# Patient Record
Sex: Female | Born: 1985
Health system: Southern US, Community
[De-identification: ages and names within clinical notes are randomized; demographics above are authoritative.]

## PROBLEM LIST (undated history)

## (undated) DIAGNOSIS — C569 Malignant neoplasm of unspecified ovary: Secondary | ICD-10-CM

## (undated) DIAGNOSIS — I10 Essential (primary) hypertension: Secondary | ICD-10-CM

## (undated) DIAGNOSIS — F419 Anxiety disorder, unspecified: Secondary | ICD-10-CM

## (undated) DIAGNOSIS — C801 Malignant (primary) neoplasm, unspecified: Secondary | ICD-10-CM

## (undated) DIAGNOSIS — G40409 Other generalized epilepsy and epileptic syndromes, not intractable, without status epilepticus: Secondary | ICD-10-CM

## (undated) DIAGNOSIS — F32A Depression, unspecified: Secondary | ICD-10-CM

## (undated) DIAGNOSIS — N200 Calculus of kidney: Secondary | ICD-10-CM

## (undated) DIAGNOSIS — D496 Neoplasm of unspecified behavior of brain: Secondary | ICD-10-CM

## (undated) DIAGNOSIS — F329 Major depressive disorder, single episode, unspecified: Secondary | ICD-10-CM

## (undated) DIAGNOSIS — R569 Unspecified convulsions: Secondary | ICD-10-CM

## (undated) DIAGNOSIS — J45909 Unspecified asthma, uncomplicated: Secondary | ICD-10-CM

## (undated) HISTORY — PX: ABDOMINAL HYSTERECTOMY: SHX81

## (undated) HISTORY — PX: APPENDECTOMY: SHX54

## (undated) HISTORY — PX: OTHER SURGICAL HISTORY: SHX169

## (undated) HISTORY — DX: Malignant neoplasm of unspecified ovary: C56.9

---
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
## 2004-04-01 ENCOUNTER — Emergency Department (HOSPITAL_COMMUNITY): Admission: EM | Admit: 2004-04-01 | Discharge: 2004-04-02 | Payer: Self-pay | Admitting: Emergency Medicine

## 2004-04-20 ENCOUNTER — Emergency Department (HOSPITAL_COMMUNITY): Admission: EM | Admit: 2004-04-20 | Discharge: 2004-04-20 | Payer: Self-pay | Admitting: Emergency Medicine

## 2004-04-30 ENCOUNTER — Emergency Department (HOSPITAL_COMMUNITY): Admission: EM | Admit: 2004-04-30 | Discharge: 2004-04-30 | Payer: Self-pay | Admitting: Emergency Medicine

## 2004-05-22 ENCOUNTER — Other Ambulatory Visit: Admission: RE | Admit: 2004-05-22 | Discharge: 2004-05-22 | Payer: Self-pay | Admitting: Obstetrics and Gynecology

## 2004-05-26 ENCOUNTER — Emergency Department (HOSPITAL_COMMUNITY): Admission: EM | Admit: 2004-05-26 | Discharge: 2004-05-26 | Payer: Self-pay | Admitting: Emergency Medicine

## 2004-08-26 ENCOUNTER — Emergency Department (HOSPITAL_COMMUNITY): Admission: EM | Admit: 2004-08-26 | Discharge: 2004-08-26 | Payer: Self-pay | Admitting: Emergency Medicine

## 2004-10-06 ENCOUNTER — Emergency Department (HOSPITAL_COMMUNITY): Admission: EM | Admit: 2004-10-06 | Discharge: 2004-10-06 | Payer: Self-pay | Admitting: Emergency Medicine

## 2005-02-01 ENCOUNTER — Emergency Department (HOSPITAL_COMMUNITY): Admission: EM | Admit: 2005-02-01 | Discharge: 2005-02-02 | Payer: Self-pay | Admitting: Emergency Medicine

## 2005-03-14 ENCOUNTER — Other Ambulatory Visit: Admission: RE | Admit: 2005-03-14 | Discharge: 2005-03-14 | Payer: Self-pay | Admitting: Obstetrics and Gynecology

## 2005-06-16 ENCOUNTER — Emergency Department (HOSPITAL_COMMUNITY): Admission: EM | Admit: 2005-06-16 | Discharge: 2005-06-16 | Payer: Self-pay | Admitting: Emergency Medicine

## 2005-07-06 ENCOUNTER — Encounter: Admission: RE | Admit: 2005-07-06 | Discharge: 2005-07-06 | Payer: Self-pay | Admitting: Neurology

## 2005-07-13 ENCOUNTER — Encounter: Admission: RE | Admit: 2005-07-13 | Discharge: 2005-07-13 | Payer: Self-pay | Admitting: Neurology

## 2005-07-30 ENCOUNTER — Emergency Department (HOSPITAL_COMMUNITY): Admission: EM | Admit: 2005-07-30 | Discharge: 2005-07-31 | Payer: Self-pay | Admitting: Emergency Medicine

## 2005-11-10 ENCOUNTER — Emergency Department (HOSPITAL_COMMUNITY): Admission: EM | Admit: 2005-11-10 | Discharge: 2005-11-10 | Payer: Self-pay | Admitting: Emergency Medicine

## 2006-07-11 ENCOUNTER — Emergency Department (HOSPITAL_COMMUNITY): Admission: EM | Admit: 2006-07-11 | Discharge: 2006-07-12 | Payer: Self-pay | Admitting: Emergency Medicine

## 2006-07-22 ENCOUNTER — Emergency Department (HOSPITAL_COMMUNITY): Admission: EM | Admit: 2006-07-22 | Discharge: 2006-07-22 | Payer: Self-pay | Admitting: Emergency Medicine

## 2006-08-01 ENCOUNTER — Emergency Department (HOSPITAL_COMMUNITY): Admission: EM | Admit: 2006-08-01 | Discharge: 2006-08-01 | Payer: Self-pay | Admitting: Emergency Medicine

## 2006-08-24 ENCOUNTER — Emergency Department (HOSPITAL_COMMUNITY): Admission: EM | Admit: 2006-08-24 | Discharge: 2006-08-24 | Payer: Self-pay | Admitting: Emergency Medicine

## 2006-08-26 ENCOUNTER — Emergency Department (HOSPITAL_COMMUNITY): Admission: EM | Admit: 2006-08-26 | Discharge: 2006-08-27 | Payer: Self-pay | Admitting: Emergency Medicine

## 2006-09-29 ENCOUNTER — Emergency Department (HOSPITAL_COMMUNITY): Admission: EM | Admit: 2006-09-29 | Discharge: 2006-09-29 | Payer: Self-pay | Admitting: *Deleted

## 2006-10-07 ENCOUNTER — Ambulatory Visit: Payer: Self-pay | Admitting: Family Medicine

## 2006-11-26 ENCOUNTER — Emergency Department (HOSPITAL_COMMUNITY): Admission: EM | Admit: 2006-11-26 | Discharge: 2006-11-26 | Payer: Self-pay | Admitting: Emergency Medicine

## 2006-12-04 ENCOUNTER — Emergency Department (HOSPITAL_COMMUNITY): Admission: EM | Admit: 2006-12-04 | Discharge: 2006-12-04 | Payer: Self-pay | Admitting: Emergency Medicine

## 2006-12-04 ENCOUNTER — Ambulatory Visit (HOSPITAL_BASED_OUTPATIENT_CLINIC_OR_DEPARTMENT_OTHER): Admission: RE | Admit: 2006-12-04 | Discharge: 2006-12-04 | Payer: Self-pay | Admitting: Urology

## 2006-12-16 ENCOUNTER — Ambulatory Visit (HOSPITAL_COMMUNITY): Admission: RE | Admit: 2006-12-16 | Discharge: 2006-12-16 | Payer: Self-pay | Admitting: Urology

## 2007-01-06 ENCOUNTER — Ambulatory Visit (HOSPITAL_COMMUNITY): Admission: RE | Admit: 2007-01-06 | Discharge: 2007-01-06 | Payer: Self-pay | Admitting: Family Medicine

## 2007-03-13 ENCOUNTER — Emergency Department (HOSPITAL_COMMUNITY): Admission: EM | Admit: 2007-03-13 | Discharge: 2007-03-13 | Payer: Self-pay | Admitting: Family Medicine

## 2007-05-06 ENCOUNTER — Emergency Department (HOSPITAL_COMMUNITY): Admission: EM | Admit: 2007-05-06 | Discharge: 2007-05-06 | Payer: Self-pay | Admitting: Emergency Medicine

## 2007-05-09 ENCOUNTER — Encounter (INDEPENDENT_AMBULATORY_CARE_PROVIDER_SITE_OTHER): Payer: Self-pay | Admitting: Family Medicine

## 2007-05-09 LAB — CONVERTED CEMR LAB: Microalb, Ur: 0.53 mg/dL (ref 0.00–1.89)

## 2007-05-20 ENCOUNTER — Encounter: Payer: Self-pay | Admitting: Family Medicine

## 2007-05-20 ENCOUNTER — Other Ambulatory Visit: Admission: RE | Admit: 2007-05-20 | Discharge: 2007-05-20 | Payer: Self-pay | Admitting: Family Medicine

## 2007-05-20 ENCOUNTER — Ambulatory Visit: Payer: Self-pay | Admitting: Internal Medicine

## 2007-05-20 LAB — CONVERTED CEMR LAB
CO2: 23 meq/L (ref 19–32)
Calcium: 9.3 mg/dL (ref 8.4–10.5)
Chloride: 104 meq/L (ref 96–112)
Cholesterol: 173 mg/dL (ref 0–200)
Creatinine, Ser: 0.85 mg/dL (ref 0.40–1.20)
Eosinophils Relative: 1 % (ref 0–5)
Glucose, Bld: 84 mg/dL (ref 70–99)
HCT: 33.6 % — ABNORMAL LOW (ref 36.0–46.0)
Hemoglobin: 10.6 g/dL — ABNORMAL LOW (ref 12.0–15.0)
Lymphocytes Relative: 42 % (ref 12–46)
Lymphs Abs: 2.3 10*3/uL (ref 0.7–4.0)
Monocytes Absolute: 0.2 10*3/uL (ref 0.1–1.0)
Sodium: 143 meq/L (ref 135–145)
Total Bilirubin: 0.3 mg/dL (ref 0.3–1.2)
Total Protein: 7.7 g/dL (ref 6.0–8.3)
Triglycerides: 79 mg/dL (ref <150)
VLDL: 16 mg/dL (ref 0–40)
WBC: 5.5 10*3/uL (ref 4.0–10.5)

## 2007-06-02 ENCOUNTER — Emergency Department (HOSPITAL_COMMUNITY): Admission: EM | Admit: 2007-06-02 | Discharge: 2007-06-02 | Payer: Self-pay | Admitting: Emergency Medicine

## 2007-06-09 ENCOUNTER — Emergency Department (HOSPITAL_COMMUNITY): Admission: EM | Admit: 2007-06-09 | Discharge: 2007-06-09 | Payer: Self-pay | Admitting: Emergency Medicine

## 2007-06-18 ENCOUNTER — Ambulatory Visit: Payer: Self-pay | Admitting: Internal Medicine

## 2007-07-02 ENCOUNTER — Ambulatory Visit: Payer: Self-pay | Admitting: *Deleted

## 2007-07-02 ENCOUNTER — Inpatient Hospital Stay (HOSPITAL_COMMUNITY): Admission: EM | Admit: 2007-07-02 | Discharge: 2007-07-06 | Payer: Self-pay | Admitting: *Deleted

## 2007-07-18 ENCOUNTER — Ambulatory Visit: Payer: Self-pay | Admitting: Internal Medicine

## 2007-07-22 ENCOUNTER — Ambulatory Visit (HOSPITAL_COMMUNITY): Admission: RE | Admit: 2007-07-22 | Discharge: 2007-07-22 | Payer: Self-pay | Admitting: Internal Medicine

## 2007-07-28 ENCOUNTER — Emergency Department (HOSPITAL_COMMUNITY): Admission: EM | Admit: 2007-07-28 | Discharge: 2007-07-28 | Payer: Self-pay | Admitting: Emergency Medicine

## 2007-08-03 ENCOUNTER — Emergency Department (HOSPITAL_COMMUNITY): Admission: EM | Admit: 2007-08-03 | Discharge: 2007-08-03 | Payer: Self-pay | Admitting: Emergency Medicine

## 2007-08-13 ENCOUNTER — Emergency Department (HOSPITAL_COMMUNITY): Admission: EM | Admit: 2007-08-13 | Discharge: 2007-08-13 | Payer: Self-pay | Admitting: Emergency Medicine

## 2007-09-13 ENCOUNTER — Emergency Department (HOSPITAL_COMMUNITY): Admission: EM | Admit: 2007-09-13 | Discharge: 2007-09-13 | Payer: Self-pay | Admitting: Emergency Medicine

## 2007-11-13 ENCOUNTER — Emergency Department (HOSPITAL_COMMUNITY): Admission: EM | Admit: 2007-11-13 | Discharge: 2007-11-13 | Payer: Self-pay | Admitting: Emergency Medicine

## 2007-11-30 ENCOUNTER — Emergency Department (HOSPITAL_COMMUNITY): Admission: EM | Admit: 2007-11-30 | Discharge: 2007-11-30 | Payer: Self-pay | Admitting: Emergency Medicine

## 2007-12-12 ENCOUNTER — Emergency Department (HOSPITAL_COMMUNITY): Admission: EM | Admit: 2007-12-12 | Discharge: 2007-12-12 | Payer: Self-pay | Admitting: Emergency Medicine

## 2007-12-13 ENCOUNTER — Ambulatory Visit: Payer: Self-pay | Admitting: Psychiatry

## 2007-12-13 ENCOUNTER — Inpatient Hospital Stay (HOSPITAL_COMMUNITY): Admission: AD | Admit: 2007-12-13 | Discharge: 2007-12-15 | Payer: Self-pay | Admitting: Psychiatry

## 2007-12-18 ENCOUNTER — Emergency Department (HOSPITAL_COMMUNITY): Admission: EM | Admit: 2007-12-18 | Discharge: 2007-12-18 | Payer: Self-pay | Admitting: Emergency Medicine

## 2008-01-15 ENCOUNTER — Emergency Department (HOSPITAL_COMMUNITY): Admission: EM | Admit: 2008-01-15 | Discharge: 2008-01-16 | Payer: Self-pay | Admitting: Emergency Medicine

## 2008-02-07 ENCOUNTER — Emergency Department (HOSPITAL_COMMUNITY): Admission: EM | Admit: 2008-02-07 | Discharge: 2008-02-07 | Payer: Self-pay | Admitting: Emergency Medicine

## 2008-02-08 ENCOUNTER — Emergency Department (HOSPITAL_COMMUNITY): Admission: EM | Admit: 2008-02-08 | Discharge: 2008-02-08 | Payer: Self-pay | Admitting: Emergency Medicine

## 2008-02-24 ENCOUNTER — Encounter (INDEPENDENT_AMBULATORY_CARE_PROVIDER_SITE_OTHER): Payer: Self-pay | Admitting: Adult Health

## 2008-02-24 ENCOUNTER — Ambulatory Visit: Payer: Self-pay | Admitting: Internal Medicine

## 2008-02-24 LAB — CONVERTED CEMR LAB
Albumin: 4.1 g/dL (ref 3.5–5.2)
Basophils Absolute: 0 10*3/uL (ref 0.0–0.1)
Basophils Relative: 0 % (ref 0–1)
CO2: 22 meq/L (ref 19–32)
Calcium: 8.9 mg/dL (ref 8.4–10.5)
Chloride: 109 meq/L (ref 96–112)
Cholesterol: 120 mg/dL (ref 0–200)
Eosinophils Absolute: 0 10*3/uL (ref 0.0–0.7)
Eosinophils Relative: 0 % (ref 0–5)
Glucose, Bld: 89 mg/dL (ref 70–99)
Hemoglobin: 11.6 g/dL — ABNORMAL LOW (ref 12.0–15.0)
LDL Cholesterol: 72 mg/dL (ref 0–99)
MCHC: 30.8 g/dL (ref 30.0–36.0)
MCV: 94.5 fL (ref 78.0–100.0)
Monocytes Absolute: 0.4 10*3/uL (ref 0.1–1.0)
Monocytes Relative: 6 % (ref 3–12)
Neutro Abs: 4 10*3/uL (ref 1.7–7.7)
RDW: 13.3 % (ref 11.5–15.5)
Total Protein: 7.4 g/dL (ref 6.0–8.3)
Triglycerides: 59 mg/dL (ref <150)

## 2008-02-25 ENCOUNTER — Encounter (INDEPENDENT_AMBULATORY_CARE_PROVIDER_SITE_OTHER): Payer: Self-pay | Admitting: Adult Health

## 2008-03-11 ENCOUNTER — Ambulatory Visit: Payer: Self-pay | Admitting: Internal Medicine

## 2008-03-15 ENCOUNTER — Emergency Department (HOSPITAL_COMMUNITY): Admission: EM | Admit: 2008-03-15 | Discharge: 2008-03-16 | Payer: Self-pay | Admitting: Emergency Medicine

## 2008-03-19 ENCOUNTER — Encounter: Admission: RE | Admit: 2008-03-19 | Discharge: 2008-03-19 | Payer: Self-pay | Admitting: Internal Medicine

## 2008-03-24 ENCOUNTER — Encounter (INDEPENDENT_AMBULATORY_CARE_PROVIDER_SITE_OTHER): Payer: Self-pay | Admitting: Adult Health

## 2008-03-24 ENCOUNTER — Ambulatory Visit: Payer: Self-pay | Admitting: Internal Medicine

## 2008-04-13 ENCOUNTER — Emergency Department (HOSPITAL_COMMUNITY): Admission: EM | Admit: 2008-04-13 | Discharge: 2008-04-13 | Payer: Self-pay | Admitting: *Deleted

## 2008-06-20 ENCOUNTER — Emergency Department (HOSPITAL_COMMUNITY): Admission: EM | Admit: 2008-06-20 | Discharge: 2008-06-20 | Payer: Self-pay | Admitting: Emergency Medicine

## 2008-09-11 ENCOUNTER — Emergency Department (HOSPITAL_COMMUNITY): Admission: EM | Admit: 2008-09-11 | Discharge: 2008-09-11 | Payer: Self-pay | Admitting: Emergency Medicine

## 2008-09-18 ENCOUNTER — Emergency Department (HOSPITAL_COMMUNITY): Admission: EM | Admit: 2008-09-18 | Discharge: 2008-09-18 | Payer: Self-pay | Admitting: Emergency Medicine

## 2008-09-27 ENCOUNTER — Emergency Department (HOSPITAL_COMMUNITY): Admission: EM | Admit: 2008-09-27 | Discharge: 2008-09-28 | Payer: Self-pay | Admitting: Emergency Medicine

## 2008-09-29 ENCOUNTER — Emergency Department (HOSPITAL_COMMUNITY): Admission: EM | Admit: 2008-09-29 | Discharge: 2008-09-30 | Payer: Self-pay | Admitting: Emergency Medicine

## 2008-10-02 ENCOUNTER — Emergency Department (HOSPITAL_BASED_OUTPATIENT_CLINIC_OR_DEPARTMENT_OTHER): Admission: EM | Admit: 2008-10-02 | Discharge: 2008-10-02 | Payer: Self-pay | Admitting: Emergency Medicine

## 2008-10-27 ENCOUNTER — Encounter (INDEPENDENT_AMBULATORY_CARE_PROVIDER_SITE_OTHER): Payer: Self-pay | Admitting: Adult Health

## 2008-10-27 ENCOUNTER — Ambulatory Visit: Payer: Self-pay | Admitting: Family Medicine

## 2008-10-27 LAB — CONVERTED CEMR LAB: GC Probe Amp, Urine: NEGATIVE

## 2008-10-28 ENCOUNTER — Encounter (INDEPENDENT_AMBULATORY_CARE_PROVIDER_SITE_OTHER): Payer: Self-pay | Admitting: Adult Health

## 2008-11-15 ENCOUNTER — Emergency Department (HOSPITAL_COMMUNITY): Admission: EM | Admit: 2008-11-15 | Discharge: 2008-11-16 | Payer: Self-pay | Admitting: Emergency Medicine

## 2008-12-14 ENCOUNTER — Ambulatory Visit: Payer: Self-pay | Admitting: Internal Medicine

## 2008-12-26 ENCOUNTER — Emergency Department (HOSPITAL_COMMUNITY): Admission: EM | Admit: 2008-12-26 | Discharge: 2008-12-26 | Payer: Self-pay | Admitting: Emergency Medicine

## 2009-02-27 ENCOUNTER — Emergency Department (HOSPITAL_COMMUNITY): Admission: EM | Admit: 2009-02-27 | Discharge: 2009-02-27 | Payer: Self-pay | Admitting: Emergency Medicine

## 2009-02-28 ENCOUNTER — Inpatient Hospital Stay (HOSPITAL_COMMUNITY): Admission: AD | Admit: 2009-02-28 | Discharge: 2009-03-02 | Payer: Self-pay | Admitting: Psychiatry

## 2009-02-28 ENCOUNTER — Ambulatory Visit: Payer: Self-pay | Admitting: Psychiatry

## 2009-03-07 ENCOUNTER — Observation Stay (HOSPITAL_COMMUNITY): Admission: EM | Admit: 2009-03-07 | Discharge: 2009-03-08 | Payer: Self-pay | Admitting: Emergency Medicine

## 2009-03-14 ENCOUNTER — Ambulatory Visit: Payer: Self-pay | Admitting: Internal Medicine

## 2009-03-17 ENCOUNTER — Observation Stay (HOSPITAL_COMMUNITY): Admission: EM | Admit: 2009-03-17 | Discharge: 2009-03-17 | Payer: Self-pay | Admitting: Emergency Medicine

## 2009-05-08 ENCOUNTER — Emergency Department (HOSPITAL_COMMUNITY): Admission: EM | Admit: 2009-05-08 | Discharge: 2009-05-08 | Payer: Self-pay | Admitting: Emergency Medicine

## 2009-05-10 ENCOUNTER — Emergency Department (HOSPITAL_COMMUNITY): Admission: EM | Admit: 2009-05-10 | Discharge: 2009-05-10 | Payer: Self-pay | Admitting: Emergency Medicine

## 2009-05-16 ENCOUNTER — Ambulatory Visit: Payer: Self-pay | Admitting: Family Medicine

## 2009-06-04 ENCOUNTER — Emergency Department (HOSPITAL_COMMUNITY): Admission: EM | Admit: 2009-06-04 | Discharge: 2009-06-05 | Payer: Self-pay | Admitting: Emergency Medicine

## 2009-06-15 ENCOUNTER — Encounter (INDEPENDENT_AMBULATORY_CARE_PROVIDER_SITE_OTHER): Payer: Self-pay | Admitting: Adult Health

## 2009-06-15 ENCOUNTER — Ambulatory Visit: Payer: Self-pay | Admitting: Internal Medicine

## 2009-06-15 ENCOUNTER — Other Ambulatory Visit: Admission: RE | Admit: 2009-06-15 | Discharge: 2009-06-15 | Payer: Self-pay | Admitting: Internal Medicine

## 2009-06-15 LAB — CONVERTED CEMR LAB
AST: 15 units/L (ref 0–37)
Albumin: 4.6 g/dL (ref 3.5–5.2)
Alkaline Phosphatase: 95 units/L (ref 39–117)
Amphetamine Screen, Ur: NEGATIVE
BUN: 12 mg/dL (ref 6–23)
Benzodiazepines.: NEGATIVE
HDL: 35 mg/dL — ABNORMAL LOW (ref >39)
LDL Cholesterol: 95 mg/dL (ref 0–99)
Methadone: NEGATIVE
Pap Smear: NEGATIVE
Potassium: 3.9 meq/L (ref 3.5–5.3)
Propoxyphene: NEGATIVE
Sodium: 139 meq/L (ref 135–145)

## 2009-07-06 ENCOUNTER — Emergency Department (HOSPITAL_COMMUNITY): Admission: EM | Admit: 2009-07-06 | Discharge: 2009-07-06 | Payer: Self-pay | Admitting: Emergency Medicine

## 2009-08-03 ENCOUNTER — Emergency Department (HOSPITAL_COMMUNITY): Admission: EM | Admit: 2009-08-03 | Discharge: 2009-08-03 | Payer: Self-pay | Admitting: Family Medicine

## 2009-10-16 ENCOUNTER — Emergency Department (HOSPITAL_COMMUNITY): Admission: EM | Admit: 2009-10-16 | Discharge: 2009-10-16 | Payer: Self-pay | Admitting: Emergency Medicine

## 2009-11-14 ENCOUNTER — Emergency Department (HOSPITAL_COMMUNITY): Admission: EM | Admit: 2009-11-14 | Discharge: 2009-11-14 | Payer: Self-pay | Admitting: Emergency Medicine

## 2009-11-16 ENCOUNTER — Emergency Department (HOSPITAL_COMMUNITY): Admission: EM | Admit: 2009-11-16 | Discharge: 2009-11-17 | Payer: Self-pay | Admitting: Emergency Medicine

## 2009-12-03 ENCOUNTER — Emergency Department (HOSPITAL_COMMUNITY): Admission: EM | Admit: 2009-12-03 | Discharge: 2009-12-04 | Payer: Self-pay | Admitting: Emergency Medicine

## 2010-03-15 ENCOUNTER — Emergency Department (HOSPITAL_COMMUNITY)
Admission: EM | Admit: 2010-03-15 | Discharge: 2010-03-15 | Payer: Self-pay | Source: Home / Self Care | Admitting: Emergency Medicine

## 2010-03-19 ENCOUNTER — Encounter: Payer: Self-pay | Admitting: Internal Medicine

## 2010-03-20 LAB — URINALYSIS, ROUTINE W REFLEX MICROSCOPIC
Protein, ur: NEGATIVE mg/dL
Urobilinogen, UA: 1 mg/dL (ref 0.0–1.0)

## 2010-03-20 LAB — GC/CHLAMYDIA PROBE AMP, GENITAL: GC Probe Amp, Genital: NEGATIVE

## 2010-03-20 LAB — URINE CULTURE
Colony Count: 15000
Culture  Setup Time: 201201181628

## 2010-03-20 LAB — URINE MICROSCOPIC-ADD ON

## 2010-03-20 LAB — WET PREP, GENITAL: Yeast Wet Prep HPF POC: NONE SEEN

## 2010-03-22 ENCOUNTER — Emergency Department (HOSPITAL_COMMUNITY)
Admission: EM | Admit: 2010-03-22 | Discharge: 2010-03-22 | Payer: Self-pay | Source: Home / Self Care | Admitting: Emergency Medicine

## 2010-03-22 LAB — URINALYSIS, ROUTINE W REFLEX MICROSCOPIC
Ketones, ur: NEGATIVE mg/dL
Nitrite: NEGATIVE
Protein, ur: NEGATIVE mg/dL
Urobilinogen, UA: 1 mg/dL (ref 0.0–1.0)

## 2010-04-05 ENCOUNTER — Emergency Department (HOSPITAL_COMMUNITY)
Admission: EM | Admit: 2010-04-05 | Discharge: 2010-04-05 | Disposition: A | Discharge status: Home / Self Care | Payer: Medicaid Other | Attending: Emergency Medicine | Admitting: Emergency Medicine

## 2010-04-05 ENCOUNTER — Emergency Department (HOSPITAL_COMMUNITY): Payer: Medicaid Other

## 2010-04-05 DIAGNOSIS — H539 Unspecified visual disturbance: Secondary | ICD-10-CM | POA: Insufficient documentation

## 2010-04-05 DIAGNOSIS — D352 Benign neoplasm of pituitary gland: Secondary | ICD-10-CM | POA: Insufficient documentation

## 2010-04-05 DIAGNOSIS — R51 Headache: Secondary | ICD-10-CM | POA: Insufficient documentation

## 2010-04-05 DIAGNOSIS — D353 Benign neoplasm of craniopharyngeal duct: Secondary | ICD-10-CM | POA: Insufficient documentation

## 2010-04-05 DIAGNOSIS — Z87442 Personal history of urinary calculi: Secondary | ICD-10-CM | POA: Insufficient documentation

## 2010-04-05 DIAGNOSIS — J45909 Unspecified asthma, uncomplicated: Secondary | ICD-10-CM | POA: Insufficient documentation

## 2010-04-05 DIAGNOSIS — R110 Nausea: Secondary | ICD-10-CM | POA: Insufficient documentation

## 2010-04-05 DIAGNOSIS — F319 Bipolar disorder, unspecified: Secondary | ICD-10-CM | POA: Insufficient documentation

## 2010-04-05 LAB — POCT PREGNANCY, URINE: Preg Test, Ur: NEGATIVE

## 2010-04-18 ENCOUNTER — Emergency Department (HOSPITAL_COMMUNITY): Payer: Medicaid Other

## 2010-04-18 ENCOUNTER — Emergency Department (HOSPITAL_COMMUNITY)
Admission: EM | Admit: 2010-04-18 | Discharge: 2010-04-18 | Disposition: A | Discharge status: Home / Self Care | Payer: Medicaid Other | Attending: Emergency Medicine | Admitting: Emergency Medicine

## 2010-04-18 DIAGNOSIS — R112 Nausea with vomiting, unspecified: Secondary | ICD-10-CM | POA: Insufficient documentation

## 2010-04-18 DIAGNOSIS — R109 Unspecified abdominal pain: Secondary | ICD-10-CM | POA: Insufficient documentation

## 2010-04-18 DIAGNOSIS — R197 Diarrhea, unspecified: Secondary | ICD-10-CM | POA: Insufficient documentation

## 2010-04-18 LAB — CBC
Platelets: 344 10*3/uL (ref 150–400)
RDW: 13 % (ref 11.5–15.5)
WBC: 9.4 10*3/uL (ref 4.0–10.5)

## 2010-04-18 LAB — DIFFERENTIAL
Basophils Absolute: 0 10*3/uL (ref 0.0–0.1)
Basophils Relative: 0 % (ref 0–1)
Eosinophils Absolute: 0 10*3/uL (ref 0.0–0.7)
Eosinophils Relative: 0 % (ref 0–5)
Lymphocytes Relative: 35 % (ref 12–46)

## 2010-04-18 LAB — COMPREHENSIVE METABOLIC PANEL
ALT: 17 U/L (ref 0–35)
Albumin: 3.8 g/dL (ref 3.5–5.2)
Alkaline Phosphatase: 83 U/L (ref 39–117)
GFR calc Af Amer: >60 mL/min (ref >60)
Potassium: 3.5 mEq/L (ref 3.5–5.1)
Sodium: 139 mEq/L (ref 135–145)
Total Protein: 7.6 g/dL (ref 6.0–8.3)

## 2010-04-24 ENCOUNTER — Emergency Department (HOSPITAL_COMMUNITY): Payer: Medicaid Other

## 2010-04-24 ENCOUNTER — Emergency Department (HOSPITAL_COMMUNITY)
Admission: EM | Admit: 2010-04-24 | Discharge: 2010-04-24 | Disposition: A | Discharge status: Home / Self Care | Payer: Medicaid Other | Attending: Emergency Medicine | Admitting: Emergency Medicine

## 2010-04-24 ENCOUNTER — Encounter (HOSPITAL_COMMUNITY): Payer: Self-pay

## 2010-04-24 DIAGNOSIS — F17200 Nicotine dependence, unspecified, uncomplicated: Secondary | ICD-10-CM | POA: Insufficient documentation

## 2010-04-24 DIAGNOSIS — F319 Bipolar disorder, unspecified: Secondary | ICD-10-CM | POA: Insufficient documentation

## 2010-04-24 DIAGNOSIS — N200 Calculus of kidney: Secondary | ICD-10-CM | POA: Insufficient documentation

## 2010-04-24 DIAGNOSIS — Z79899 Other long term (current) drug therapy: Secondary | ICD-10-CM | POA: Insufficient documentation

## 2010-04-24 DIAGNOSIS — R109 Unspecified abdominal pain: Secondary | ICD-10-CM | POA: Insufficient documentation

## 2010-04-24 LAB — URINE MICROSCOPIC-ADD ON

## 2010-04-24 LAB — DIFFERENTIAL
Eosinophils Absolute: 0.1 10*3/uL (ref 0.0–0.7)
Eosinophils Relative: 1 % (ref 0–5)
Lymphocytes Relative: 33 % (ref 12–46)
Lymphs Abs: 3 10*3/uL (ref 0.7–4.0)
Monocytes Relative: 6 % (ref 3–12)
Neutrophils Relative %: 60 % (ref 43–77)

## 2010-04-24 LAB — CBC
HCT: 36.6 % (ref 36.0–46.0)
MCH: 29.7 pg (ref 26.0–34.0)
MCV: 93.8 fL (ref 78.0–100.0)
Platelets: 394 10*3/uL (ref 150–400)
RBC: 3.9 MIL/uL (ref 3.87–5.11)

## 2010-04-24 LAB — COMPREHENSIVE METABOLIC PANEL
AST: 23 U/L (ref 0–37)
Albumin: 3.7 g/dL (ref 3.5–5.2)
BUN: 12 mg/dL (ref 6–23)
Calcium: 9 mg/dL (ref 8.4–10.5)
Chloride: 107 mEq/L (ref 96–112)
Creatinine, Ser: 0.74 mg/dL (ref 0.4–1.2)
GFR calc Af Amer: >60 mL/min (ref >60)
Total Protein: 7.2 g/dL (ref 6.0–8.3)

## 2010-04-24 LAB — URINALYSIS, ROUTINE W REFLEX MICROSCOPIC
Bilirubin Urine: NEGATIVE
Specific Gravity, Urine: 1.028 (ref 1.005–1.030)
Urine Glucose, Fasting: NEGATIVE mg/dL
Urobilinogen, UA: 0.2 mg/dL (ref 0.0–1.0)

## 2010-04-25 ENCOUNTER — Other Ambulatory Visit: Payer: Self-pay | Admitting: Family Medicine

## 2010-04-25 DIAGNOSIS — N6452 Nipple discharge: Secondary | ICD-10-CM

## 2010-04-26 ENCOUNTER — Encounter (INDEPENDENT_AMBULATORY_CARE_PROVIDER_SITE_OTHER): Payer: Self-pay | Admitting: *Deleted

## 2010-05-02 ENCOUNTER — Emergency Department (HOSPITAL_COMMUNITY)
Admission: EM | Admit: 2010-05-02 | Discharge: 2010-05-02 | Disposition: A | Discharge status: Home / Self Care | Payer: Medicaid Other | Attending: Emergency Medicine | Admitting: Emergency Medicine

## 2010-05-02 DIAGNOSIS — M545 Low back pain, unspecified: Secondary | ICD-10-CM | POA: Insufficient documentation

## 2010-05-02 DIAGNOSIS — Z87442 Personal history of urinary calculi: Secondary | ICD-10-CM | POA: Insufficient documentation

## 2010-05-02 DIAGNOSIS — R109 Unspecified abdominal pain: Secondary | ICD-10-CM | POA: Insufficient documentation

## 2010-05-02 DIAGNOSIS — E669 Obesity, unspecified: Secondary | ICD-10-CM | POA: Insufficient documentation

## 2010-05-02 DIAGNOSIS — R112 Nausea with vomiting, unspecified: Secondary | ICD-10-CM | POA: Insufficient documentation

## 2010-05-02 LAB — PREGNANCY, URINE: Preg Test, Ur: NEGATIVE

## 2010-05-02 LAB — URINALYSIS, ROUTINE W REFLEX MICROSCOPIC
Bilirubin Urine: NEGATIVE
Glucose, UA: NEGATIVE mg/dL
Hgb urine dipstick: NEGATIVE
Ketones, ur: NEGATIVE mg/dL
Protein, ur: NEGATIVE mg/dL

## 2010-05-02 LAB — RAPID URINE DRUG SCREEN, HOSP PERFORMED
Barbiturates: NOT DETECTED
Benzodiazepines: NOT DETECTED
Cocaine: NOT DETECTED
Opiates: NOT DETECTED

## 2010-05-03 ENCOUNTER — Ambulatory Visit
Admission: RE | Admit: 2010-05-03 | Discharge: 2010-05-03 | Disposition: A | Discharge status: Home / Self Care | Payer: Medicaid Other | Source: Ambulatory Visit | Attending: Family Medicine | Admitting: Family Medicine

## 2010-05-03 DIAGNOSIS — N6452 Nipple discharge: Secondary | ICD-10-CM

## 2010-05-05 ENCOUNTER — Emergency Department (HOSPITAL_COMMUNITY)
Admission: EM | Admit: 2010-05-05 | Discharge: 2010-05-05 | Disposition: A | Discharge status: Home / Self Care | Payer: No Typology Code available for payment source | Attending: Emergency Medicine | Admitting: Emergency Medicine

## 2010-05-05 DIAGNOSIS — Y92410 Unspecified street and highway as the place of occurrence of the external cause: Secondary | ICD-10-CM | POA: Insufficient documentation

## 2010-05-05 DIAGNOSIS — T148XXA Other injury of unspecified body region, initial encounter: Secondary | ICD-10-CM | POA: Insufficient documentation

## 2010-05-05 DIAGNOSIS — M549 Dorsalgia, unspecified: Secondary | ICD-10-CM | POA: Insufficient documentation

## 2010-05-10 LAB — URINE MICROSCOPIC-ADD ON

## 2010-05-10 LAB — DIFFERENTIAL
Basophils Absolute: 0 10*3/uL (ref 0.0–0.1)
Basophils Relative: 0 % (ref 0–1)
Eosinophils Absolute: 0 10*3/uL (ref 0.0–0.7)
Eosinophils Relative: 1 % (ref 0–5)
Monocytes Absolute: 0.4 10*3/uL (ref 0.1–1.0)
Neutro Abs: 7.8 10*3/uL — ABNORMAL HIGH (ref 1.7–7.7)

## 2010-05-10 LAB — URINALYSIS, ROUTINE W REFLEX MICROSCOPIC
Glucose, UA: NEGATIVE mg/dL
Hgb urine dipstick: NEGATIVE
Specific Gravity, Urine: 1.034 — ABNORMAL HIGH (ref 1.005–1.030)
Urobilinogen, UA: 1 mg/dL (ref 0.0–1.0)
pH: 6 (ref 5.0–8.0)

## 2010-05-10 LAB — BASIC METABOLIC PANEL
BUN: 10 mg/dL (ref 6–23)
GFR calc non Af Amer: 46 mL/min — ABNORMAL LOW (ref >60)
Glucose, Bld: 109 mg/dL — ABNORMAL HIGH (ref 70–99)
Potassium: 3.4 mEq/L — ABNORMAL LOW (ref 3.5–5.1)

## 2010-05-10 LAB — CBC
HCT: 35 % — ABNORMAL LOW (ref 36.0–46.0)
MCHC: 33.3 g/dL (ref 30.0–36.0)
MCV: 92.3 fL (ref 78.0–100.0)
RDW: 13.6 % (ref 11.5–15.5)

## 2010-05-10 LAB — URINE CULTURE: Colony Count: >=100000

## 2010-05-10 LAB — RAPID URINE DRUG SCREEN, HOSP PERFORMED
Barbiturates: NOT DETECTED
Opiates: NOT DETECTED
Tetrahydrocannabinol: POSITIVE — AB

## 2010-05-14 LAB — COMPREHENSIVE METABOLIC PANEL
ALT: 20 U/L (ref 0–35)
Albumin: 3.9 g/dL (ref 3.5–5.2)
Alkaline Phosphatase: 88 U/L (ref 39–117)
BUN: 6 mg/dL (ref 6–23)
Chloride: 104 mEq/L (ref 96–112)
Glucose, Bld: 98 mg/dL (ref 70–99)
Potassium: 3 mEq/L — ABNORMAL LOW (ref 3.5–5.1)
Sodium: 139 mEq/L (ref 135–145)
Total Bilirubin: 0.6 mg/dL (ref 0.3–1.2)

## 2010-05-14 LAB — DIFFERENTIAL
Basophils Absolute: 0 10*3/uL (ref 0.0–0.1)
Basophils Absolute: 0 10*3/uL (ref 0.0–0.1)
Basophils Relative: 1 % (ref 0–1)
Basophils Relative: 1 % (ref 0–1)
Eosinophils Absolute: 0.1 10*3/uL (ref 0.0–0.7)
Eosinophils Absolute: 0.2 10*3/uL (ref 0.0–0.7)
Eosinophils Absolute: 0.1 10*3/uL (ref 0.0–0.7)
Eosinophils Relative: 2 % (ref 0–5)
Lymphs Abs: 3.2 10*3/uL (ref 0.7–4.0)
Monocytes Absolute: 0.4 10*3/uL (ref 0.1–1.0)
Monocytes Absolute: 0.5 10*3/uL (ref 0.1–1.0)
Monocytes Absolute: 0.6 10*3/uL (ref 0.1–1.0)
Monocytes Relative: 5 % (ref 3–12)
Neutro Abs: 4.6 10*3/uL (ref 1.7–7.7)
Neutro Abs: 4.8 10*3/uL (ref 1.7–7.7)
Neutrophils Relative %: 56 % (ref 43–77)
Neutrophils Relative %: 69 % (ref 43–77)

## 2010-05-14 LAB — BASIC METABOLIC PANEL
BUN: 12 mg/dL (ref 6–23)
CO2: 18 mEq/L — ABNORMAL LOW (ref 19–32)
Calcium: 9.5 mg/dL (ref 8.4–10.5)
Chloride: 106 mEq/L (ref 96–112)
Creatinine, Ser: 1.27 mg/dL — ABNORMAL HIGH (ref 0.4–1.2)
Glucose, Bld: 124 mg/dL — ABNORMAL HIGH (ref 70–99)
Potassium: 3.7 mEq/L (ref 3.5–5.1)
Sodium: 141 mEq/L (ref 135–145)

## 2010-05-14 LAB — URINALYSIS, ROUTINE W REFLEX MICROSCOPIC
Bilirubin Urine: NEGATIVE
Glucose, UA: NEGATIVE mg/dL
Hgb urine dipstick: NEGATIVE
Ketones, ur: NEGATIVE mg/dL
Nitrite: NEGATIVE
Protein, ur: NEGATIVE mg/dL
Specific Gravity, Urine: 1.029 (ref 1.005–1.030)
Urobilinogen, UA: 1 mg/dL (ref 0.0–1.0)
Urobilinogen, UA: 1 mg/dL (ref 0.0–1.0)

## 2010-05-14 LAB — POCT PREGNANCY, URINE
Preg Test, Ur: NEGATIVE
Preg Test, Ur: NEGATIVE

## 2010-05-14 LAB — POCT I-STAT, CHEM 8
Calcium, Ion: 1.19 mmol/L (ref 1.12–1.32)
Chloride: 109 mEq/L (ref 96–112)
HCT: 39 % (ref 36.0–46.0)
Potassium: 3.7 mEq/L (ref 3.5–5.1)

## 2010-05-14 LAB — RAPID URINE DRUG SCREEN, HOSP PERFORMED
Opiates: NOT DETECTED
Tetrahydrocannabinol: POSITIVE — AB
Tetrahydrocannabinol: POSITIVE — AB

## 2010-05-14 LAB — CBC
HCT: 32.9 % — ABNORMAL LOW (ref 36.0–46.0)
HCT: 33.9 % — ABNORMAL LOW (ref 36.0–46.0)
Hemoglobin: 11.2 g/dL — ABNORMAL LOW (ref 12.0–15.0)
Hemoglobin: 11.6 g/dL — ABNORMAL LOW (ref 12.0–15.0)
MCHC: 32.3 g/dL (ref 30.0–36.0)
MCV: 92.7 fL (ref 78.0–100.0)
RBC: 3.55 MIL/uL — ABNORMAL LOW (ref 3.87–5.11)
RDW: 14 % (ref 11.5–15.5)
WBC: 9.9 10*3/uL (ref 4.0–10.5)
WBC: 6.9 10*3/uL (ref 4.0–10.5)

## 2010-05-14 LAB — TRICYCLICS SCREEN, URINE: TCA Scrn: NOT DETECTED

## 2010-05-14 LAB — PREGNANCY, URINE: Preg Test, Ur: NEGATIVE

## 2010-05-14 LAB — VALPROIC ACID LEVEL: Valproic Acid Lvl: <10 ug/mL — ABNORMAL LOW (ref 50.0–100.0)

## 2010-05-14 LAB — POCT CARDIAC MARKERS: Troponin i, poc: <0.05 ng/mL (ref 0.00–0.09)

## 2010-05-17 LAB — POCT I-STAT, CHEM 8
BUN: 8 mg/dL (ref 6–23)
Calcium, Ion: 1.18 mmol/L (ref 1.12–1.32)
Chloride: 105 mEq/L (ref 96–112)
Creatinine, Ser: 1.2 mg/dL (ref 0.4–1.2)
Glucose, Bld: 106 mg/dL — ABNORMAL HIGH (ref 70–99)
TCO2: 27 mmol/L (ref 0–100)

## 2010-05-25 ENCOUNTER — Emergency Department (HOSPITAL_COMMUNITY)
Admission: EM | Admit: 2010-05-25 | Discharge: 2010-05-25 | Disposition: A | Discharge status: Home / Self Care | Payer: Medicaid Other | Attending: Emergency Medicine | Admitting: Emergency Medicine

## 2010-05-25 DIAGNOSIS — M545 Low back pain, unspecified: Secondary | ICD-10-CM | POA: Insufficient documentation

## 2010-05-25 DIAGNOSIS — M549 Dorsalgia, unspecified: Secondary | ICD-10-CM | POA: Insufficient documentation

## 2010-06-01 LAB — URINALYSIS, ROUTINE W REFLEX MICROSCOPIC
Hgb urine dipstick: NEGATIVE
Nitrite: NEGATIVE
Specific Gravity, Urine: 1.015 (ref 1.005–1.030)
Urobilinogen, UA: 0.2 mg/dL (ref 0.0–1.0)
pH: 6 (ref 5.0–8.0)

## 2010-06-01 LAB — CBC
HCT: 36.4 % (ref 36.0–46.0)
MCHC: 33.1 g/dL (ref 30.0–36.0)
MCV: 93.3 fL (ref 78.0–100.0)
Platelets: 324 10*3/uL (ref 150–400)
RDW: 13.1 % (ref 11.5–15.5)
WBC: 9.1 10*3/uL (ref 4.0–10.5)

## 2010-06-01 LAB — COMPREHENSIVE METABOLIC PANEL
AST: 19 U/L (ref 0–37)
Albumin: 3.5 g/dL (ref 3.5–5.2)
BUN: 7 mg/dL (ref 6–23)
Calcium: 8.8 mg/dL (ref 8.4–10.5)
Chloride: 104 mEq/L (ref 96–112)
Creatinine, Ser: 0.85 mg/dL (ref 0.4–1.2)
GFR calc Af Amer: >60 mL/min (ref >60)
Total Bilirubin: 0.4 mg/dL (ref 0.3–1.2)

## 2010-06-01 LAB — DIFFERENTIAL
Basophils Absolute: 0.2 10*3/uL — ABNORMAL HIGH (ref 0.0–0.1)
Eosinophils Relative: 2 % (ref 0–5)
Lymphocytes Relative: 23 % (ref 12–46)
Lymphs Abs: 2.1 10*3/uL (ref 0.7–4.0)
Monocytes Absolute: 0.5 10*3/uL (ref 0.1–1.0)
Neutro Abs: 6.2 10*3/uL (ref 1.7–7.7)

## 2010-06-01 LAB — PREGNANCY, URINE: Preg Test, Ur: NEGATIVE

## 2010-06-02 LAB — COMPREHENSIVE METABOLIC PANEL
ALT: 19 U/L (ref 0–35)
AST: 18 U/L (ref 0–37)
Albumin: 3.5 g/dL (ref 3.5–5.2)
Alkaline Phosphatase: 76 U/L (ref 39–117)
Chloride: 105 mEq/L (ref 96–112)
GFR calc Af Amer: >60 mL/min (ref >60)
Potassium: 3.5 mEq/L (ref 3.5–5.1)
Sodium: 138 mEq/L (ref 135–145)
Total Bilirubin: 0.4 mg/dL (ref 0.3–1.2)

## 2010-06-02 LAB — CBC
Platelets: 322 10*3/uL (ref 150–400)
RBC: 3.54 MIL/uL — ABNORMAL LOW (ref 3.87–5.11)
WBC: 9.8 10*3/uL (ref 4.0–10.5)

## 2010-06-02 LAB — LIPASE, BLOOD: Lipase: 33 U/L (ref 11–59)

## 2010-06-02 LAB — DIFFERENTIAL
Lymphocytes Relative: 39 % (ref 12–46)
Lymphs Abs: 3.8 10*3/uL (ref 0.7–4.0)
Neutrophils Relative %: 53 % (ref 43–77)

## 2010-06-02 LAB — URINALYSIS, ROUTINE W REFLEX MICROSCOPIC
Protein, ur: NEGATIVE mg/dL
Specific Gravity, Urine: 1.031 — ABNORMAL HIGH (ref 1.005–1.030)
Urobilinogen, UA: 0.2 mg/dL (ref 0.0–1.0)

## 2010-06-03 LAB — URINALYSIS, ROUTINE W REFLEX MICROSCOPIC
Glucose, UA: NEGATIVE mg/dL
Hgb urine dipstick: NEGATIVE
Nitrite: NEGATIVE
Specific Gravity, Urine: 1.024 (ref 1.005–1.030)
Specific Gravity, Urine: 1.025 (ref 1.005–1.030)
pH: 6.5 (ref 5.0–8.0)
pH: 7.5 (ref 5.0–8.0)

## 2010-06-03 LAB — POCT I-STAT, CHEM 8
BUN: 12 mg/dL (ref 6–23)
Calcium, Ion: 1.15 mmol/L (ref 1.12–1.32)
Chloride: 104 mEq/L (ref 96–112)
Chloride: 107 mEq/L (ref 96–112)
Creatinine, Ser: 1.1 mg/dL (ref 0.4–1.2)
HCT: 34 % — ABNORMAL LOW (ref 36.0–46.0)
Hemoglobin: 11.6 g/dL — ABNORMAL LOW (ref 12.0–15.0)
Potassium: 3.4 mEq/L — ABNORMAL LOW (ref 3.5–5.1)
Potassium: 3.6 mEq/L (ref 3.5–5.1)
Sodium: 142 mEq/L (ref 135–145)
TCO2: 26 mmol/L (ref 0–100)

## 2010-06-03 LAB — URINE MICROSCOPIC-ADD ON

## 2010-06-03 LAB — DIFFERENTIAL
Basophils Absolute: 0.2 10*3/uL — ABNORMAL HIGH (ref 0.0–0.1)
Basophils Relative: 1 % (ref 0–1)
Eosinophils Absolute: 0.1 10*3/uL (ref 0.0–0.7)
Eosinophils Relative: 1 % (ref 0–5)
Lymphocytes Relative: 18 % (ref 12–46)
Lymphs Abs: 2 10*3/uL (ref 0.7–4.0)
Neutro Abs: 12.1 10*3/uL — ABNORMAL HIGH (ref 1.7–7.7)
Neutro Abs: 8.6 10*3/uL — ABNORMAL HIGH (ref 1.7–7.7)
Neutrophils Relative %: 70 % (ref 43–77)

## 2010-06-03 LAB — WET PREP, GENITAL

## 2010-06-03 LAB — CBC
HCT: 34.8 % — ABNORMAL LOW (ref 36.0–46.0)
MCHC: 31.9 g/dL (ref 30.0–36.0)
MCV: 94.9 fL (ref 78.0–100.0)
Platelets: 323 10*3/uL (ref 150–400)
RDW: 13.7 % (ref 11.5–15.5)
WBC: 11.2 10*3/uL — ABNORMAL HIGH (ref 4.0–10.5)

## 2010-06-03 LAB — POCT PREGNANCY, URINE: Preg Test, Ur: NEGATIVE

## 2010-06-03 LAB — GC/CHLAMYDIA PROBE AMP, GENITAL: GC Probe Amp, Genital: NEGATIVE

## 2010-06-03 LAB — URINE CULTURE: Colony Count: 30000

## 2010-06-04 LAB — DIFFERENTIAL
Basophils Absolute: 0 10*3/uL (ref 0.0–0.1)
Basophils Relative: 0 % (ref 0–1)
Eosinophils Absolute: 0.1 10*3/uL (ref 0.0–0.7)
Eosinophils Relative: 1 % (ref 0–5)
Eosinophils Relative: 1 % (ref 0–5)
Lymphocytes Relative: 28 % (ref 12–46)
Lymphs Abs: 3.4 10*3/uL (ref 0.7–4.0)
Monocytes Absolute: 0.4 10*3/uL (ref 0.1–1.0)
Monocytes Relative: 3 % (ref 3–12)
Neutrophils Relative %: 63 % (ref 43–77)
Neutrophils Relative %: 66 % (ref 43–77)

## 2010-06-04 LAB — URINE MICROSCOPIC-ADD ON

## 2010-06-04 LAB — URINALYSIS, ROUTINE W REFLEX MICROSCOPIC
Bilirubin Urine: NEGATIVE
Glucose, UA: NEGATIVE mg/dL
Glucose, UA: NEGATIVE mg/dL
Hgb urine dipstick: NEGATIVE
Hgb urine dipstick: NEGATIVE
Ketones, ur: NEGATIVE mg/dL
Protein, ur: NEGATIVE mg/dL
Protein, ur: NEGATIVE mg/dL
pH: 6 (ref 5.0–8.0)
pH: 6.5 (ref 5.0–8.0)

## 2010-06-04 LAB — BASIC METABOLIC PANEL
CO2: 25 mEq/L (ref 19–32)
Chloride: 107 mEq/L (ref 96–112)
Creatinine, Ser: 0.9 mg/dL (ref 0.4–1.2)
GFR calc Af Amer: >60 mL/min (ref >60)
Potassium: 3.5 mEq/L (ref 3.5–5.1)

## 2010-06-04 LAB — POCT TOXICOLOGY PANEL

## 2010-06-04 LAB — COMPREHENSIVE METABOLIC PANEL
AST: 19 U/L (ref 0–37)
BUN: 8 mg/dL (ref 6–23)
CO2: 27 mEq/L (ref 19–32)
Calcium: 9.4 mg/dL (ref 8.4–10.5)
Chloride: 109 mEq/L (ref 96–112)
Creatinine, Ser: 1.02 mg/dL (ref 0.4–1.2)
GFR calc Af Amer: >60 mL/min (ref >60)
GFR calc non Af Amer: >60 mL/min (ref >60)
Glucose, Bld: 86 mg/dL (ref 70–99)
Total Bilirubin: 0.5 mg/dL (ref 0.3–1.2)

## 2010-06-04 LAB — CBC
HCT: 33.4 % — ABNORMAL LOW (ref 36.0–46.0)
HCT: 35.2 % — ABNORMAL LOW (ref 36.0–46.0)
Hemoglobin: 11.4 g/dL — ABNORMAL LOW (ref 12.0–15.0)
Hemoglobin: 11.6 g/dL — ABNORMAL LOW (ref 12.0–15.0)
MCHC: 33 g/dL (ref 30.0–36.0)
MCHC: 34.2 g/dL (ref 30.0–36.0)
MCV: 93.2 fL (ref 78.0–100.0)
MCV: 93.9 fL (ref 78.0–100.0)
RBC: 3.56 MIL/uL — ABNORMAL LOW (ref 3.87–5.11)
RBC: 3.77 MIL/uL — ABNORMAL LOW (ref 3.87–5.11)
WBC: 10.6 10*3/uL — ABNORMAL HIGH (ref 4.0–10.5)
WBC: 8.4 10*3/uL (ref 4.0–10.5)

## 2010-06-04 LAB — LIPASE, BLOOD: Lipase: 26 U/L (ref 11–59)

## 2010-06-04 LAB — ETHANOL: Alcohol, Ethyl (B): <5 mg/dL (ref 0–10)

## 2010-06-10 ENCOUNTER — Emergency Department (HOSPITAL_COMMUNITY)
Admission: EM | Admit: 2010-06-10 | Discharge: 2010-06-10 | Disposition: A | Discharge status: Home / Self Care | Payer: No Typology Code available for payment source | Attending: Emergency Medicine | Admitting: Emergency Medicine

## 2010-06-10 DIAGNOSIS — M549 Dorsalgia, unspecified: Secondary | ICD-10-CM | POA: Insufficient documentation

## 2010-06-10 DIAGNOSIS — Z87442 Personal history of urinary calculi: Secondary | ICD-10-CM | POA: Insufficient documentation

## 2010-06-10 DIAGNOSIS — G8929 Other chronic pain: Secondary | ICD-10-CM | POA: Insufficient documentation

## 2010-06-12 LAB — URINE MICROSCOPIC-ADD ON

## 2010-06-12 LAB — URINALYSIS, ROUTINE W REFLEX MICROSCOPIC
Bilirubin Urine: NEGATIVE
Hgb urine dipstick: NEGATIVE
Nitrite: NEGATIVE
Protein, ur: NEGATIVE mg/dL
Urobilinogen, UA: 1 mg/dL (ref 0.0–1.0)

## 2010-06-12 LAB — POCT PREGNANCY, URINE: Preg Test, Ur: NEGATIVE

## 2010-06-13 LAB — COMPREHENSIVE METABOLIC PANEL
ALT: 25 U/L (ref 0–35)
AST: 24 U/L (ref 0–37)
Albumin: 3.7 g/dL (ref 3.5–5.2)
Alkaline Phosphatase: 94 U/L (ref 39–117)
Chloride: 104 mEq/L (ref 96–112)
Potassium: 3.9 mEq/L (ref 3.5–5.1)
Sodium: 136 mEq/L (ref 135–145)
Total Protein: 7.2 g/dL (ref 6.0–8.3)

## 2010-06-13 LAB — URINE CULTURE

## 2010-06-13 LAB — URINALYSIS, ROUTINE W REFLEX MICROSCOPIC
Bilirubin Urine: NEGATIVE
Nitrite: NEGATIVE
Specific Gravity, Urine: 1.027 (ref 1.005–1.030)
Urobilinogen, UA: 1 mg/dL (ref 0.0–1.0)

## 2010-06-13 LAB — POCT PREGNANCY, URINE: Preg Test, Ur: NEGATIVE

## 2010-06-13 LAB — POCT I-STAT, CHEM 8
Glucose, Bld: 110 mg/dL — ABNORMAL HIGH (ref 70–99)
HCT: 39 % (ref 36.0–46.0)
Hemoglobin: 13.3 g/dL (ref 12.0–15.0)
Potassium: 4 mEq/L (ref 3.5–5.1)
Sodium: 139 mEq/L (ref 135–145)

## 2010-06-27 ENCOUNTER — Other Ambulatory Visit (HOSPITAL_COMMUNITY): Payer: Self-pay | Admitting: Family Medicine

## 2010-06-28 ENCOUNTER — Other Ambulatory Visit (HOSPITAL_COMMUNITY): Payer: Self-pay | Admitting: Family Medicine

## 2010-06-28 DIAGNOSIS — D352 Benign neoplasm of pituitary gland: Secondary | ICD-10-CM

## 2010-06-30 ENCOUNTER — Other Ambulatory Visit (HOSPITAL_COMMUNITY): Payer: No Typology Code available for payment source

## 2010-07-01 ENCOUNTER — Emergency Department (HOSPITAL_COMMUNITY)
Admission: EM | Admit: 2010-07-01 | Discharge: 2010-07-01 | Disposition: A | Discharge status: Other Acute Inpatient Hospital | Payer: Medicaid Other | Attending: Emergency Medicine | Admitting: Emergency Medicine

## 2010-07-01 DIAGNOSIS — R45851 Suicidal ideations: Secondary | ICD-10-CM | POA: Insufficient documentation

## 2010-07-01 DIAGNOSIS — J45909 Unspecified asthma, uncomplicated: Secondary | ICD-10-CM | POA: Insufficient documentation

## 2010-07-01 DIAGNOSIS — F319 Bipolar disorder, unspecified: Secondary | ICD-10-CM | POA: Insufficient documentation

## 2010-07-07 ENCOUNTER — Inpatient Hospital Stay (HOSPITAL_COMMUNITY): Admission: RE | Admit: 2010-07-07 | Payer: No Typology Code available for payment source | Source: Ambulatory Visit

## 2010-07-11 NOTE — Op Note (Signed)
[name], [name]           ACCOUNT NO.:  [id number]   MEDICAL RECORD NO.:  [id number]          PATIENT TYPE:  AMB   LOCATION:  NESC                         FACILITY:  [facility]   PHYSICIAN:  Boston Service, M.D.DATE OF BIRTH:  [birth year]   DATE OF PROCEDURE:  12/04/2006  DATE OF DISCHARGE:                               OPERATIVE REPORT   UROLOGIST:  Boston Service, M.D.   PREOPERATIVE DIAGNOSIS:  A 25 year old black female on Haldol, 63-month  history of 9 mm left ureteropelvic junction stone.   POSTOPERATIVE DIAGNOSIS:  A 25 year old black female on Haldol, 12-month  history of 9 mm left ureteropelvic junction stone.   PROCEDURE:  Cystoscopy, retrograde ureteroscopy, both short and long 6-  [place] scopes were used, double-J stent 6-French 26 cm.   SURGEON:  Boston Service, M.D.   ASSISTANT:  None.   ANESTHESIA:  General.  The patient had desaturation with LMA, was  switched to endotracheal tube, appreciate Dr. Lennie Muckle  careful monitoring and clinical expertise.   FINDINGS:  Tightly impacted 9 mm left ureteropelvic junction stone times  5 months.   ESTIMATED BLOOD LOSS:  Minimal.   COMPLICATIONS:  None obvious.   DESCRIPTION OF PROCEDURE:  The patient was prepped and draped in the  dorsal lithotomy position after institution of an adequate level of  general anesthesia, first by LMA and then by ET tube.  A well lubricated  21-French panendoscope was then gently inserted at the urethral meatus.  Urethra had been carefully dilated using female sounds.  Bladder showed  normal urothelium.  Inspection with 12 and 70 degrees lenses showed no  obvious intravesical pathology.   A 6-French ureteral catheter was inserted at the right and left ureteral  orifices.  On the right side the patient had partial duplication but  otherwise no filling defect or obstruction with prompt drainage at 3-5  minutes.  On the left side, the patient appeared to have a  tightly  impacted 9 mm filling defect at about the level of the UPJ with proximal  hydronephrosis.   Floppy tip guidewire was inserted at the left ureteral orifice, advanced  into the dilated upper pole calyces with prompt efflux of concentrated  bloody urine around the guidewire.  The short 6-French ureteroscope was  then inserted alongside the guidewire.  It could only be advanced to a  point about 2 cm below the stone, possibly related to the patient's body  habitus 5 feet 4 inches tall, greater than 300 pounds.  The short 6-  [place] scope was then withdrawn and the long 6-French scope was  inserted.  A second guidewire was passed alongside the initial guidewire  with hopes of being able to advance the scope to the stone.  The area of  impaction in the UPJ, however, prevented advancement of the scope to the  stone.  The scope was then withdrawn and a 6-French 26 cm double-J stent  was passed over the indwelling guidewire with pigtail formation just  above the stone as well as good pigtail formation within the bladder.  On guidewire removal there was prompt reflux of concentrated bloody  urine through the fenestrations of the double-J stent.  The patient was  given to be suppository and then returned to recovery in satisfactory  condition.           ______________________________  Boston Service, M.D.     RH/MEDQ  D:  12/04/2006  T:  12/05/2006  Job:  [id number]   cc:   Dineen Kid. Reche Dixon, M.D.  Fax: [phone]   [facility]

## 2010-07-11 NOTE — H&P (Signed)
[name], [name]           ACCOUNT NO.:  [id number]   MEDICAL RECORD NO.:  [id number]          PATIENT TYPE:  IPS   LOCATION:  0301                          FACILITY:  [facility]   PHYSICIAN:  Jasmine Pang, M.D. DATE OF BIRTH:  03-03-1985   DATE OF ADMISSION:  07/02/2007  DATE OF DISCHARGE:                       PSYCHIATRIC ADMISSION ASSESSMENT   This is a 25 year old female involuntarily committed on Jul 02, 2007.   HISTORY OF PRESENT ILLNESS:  The patient is here on petition.  Papers  stated the patient overdosed on narcotics in an attempt to hurt herself.  The patient did state that she took medications that she had, and it was  approximately 40 tablets of Vicodin 5/500.  She states that her  intention was to kill herself.  She states her reason was that she has  been thinking about her mother who was killed in front of her when the  patient was 25 years of age.  She also reports hearing voices at times.  Denies any current hallucinations.  She also reports having problems  with a living situation.  Currently living with a cousin.  The patient  also reports having little social support.  Denies any substance use.   PAST PSYCHIATRIC HISTORY:  The patient has been in our facility before  and the child adolescent unit.  She sees Dr. Rosalia Hammers at [facility].   SOCIAL HISTORY:  A 25 year old, single female.  She is currently living  with a cousin.  She is on disability.  No legal problems.   FAMILY HISTORY:  None.   ALCOHOL/DRUG HISTORY:  No alcohol or drug use.  Nonsmoker.  Primary care  Yamen Castrogiovanni is unknown.   MEDICAL PROBLEMS:  No known acute or chronic health issues.  The patient  is obese.  She is currently 5 feet 4 inches tall and a weight of 293.  She also is complaining of a rash under her right breast.   MEDICATIONS:  Haldol Dec monthly with her last injection on the 17th of  April also on albuterol inhaler p.r.n.   DRUG ALLERGIES:  NO KNOWN  ALLERGIES.   Again, this is an obese, young female who was fully assessed at [facility].  Her physical exam was reviewed.  The patient  did receive charcoal and 2 doses of Mucomyst.  Also noted the patient  was threatening to remove her IV and to leave the Emergency Department.  Petition papers were put into place.  Temperature of 98.1, 72 heart  rate, 60 respirations.  Blood pressure is 146/82, 100% saturated.   LABORATORY DATA:  Hemoglobin 11.3, hematocrit 32.9.  Acetaminophen level  initially was 60 down to 10 prior to transfer.  C-met within normal  limits.  Urinalysis negative.  Pregnancy test is negative.  Alcohol  level less than 5.  Salicylate level less than 4.  Urine drug screen was  positive for cocaine and THC.   MENTAL STATUS EXAM:  The patient is cooperative, currently wearing  hospital gown, good eye contact.  Speech is somewhat slow.  The patient  tends to speak in much younger  years than she is.  Mood is neutral.  The  patient's affect somewhat sad especially when she talks about her  mother's death.  Thought process is endorsing hallucinations.  Does not  appear to be actively responding to an internal stimuli at this time.  Cognitive functioning she is aware of herself and situation and place.  Some limited cognitive abilities.  Memory is fair.  Judgment and insight  is poor.   AXIS I:  Major depressive disorder recurrent, severe.  AXIS II:  Deferred.  AXIS III:  Status post opiate and Tylenol overdose.  AXIS IV:  Other psychosocial problems, please refer to the admit notes.  Possible problems with housing, lack of support.  AXIS V:  Current is 25-30.   Plan is to contract for safety.  We will stabilize her mood and  thinking.  We will verify the patient's last Haldol Dec injection.  We  will have Haldol available on a p.r.n. basis at this time.  We will  continue to gather more history identifying her support.  Case manager  is to assess  her living situation.  We will reinforce medication  compliance.  The patient will be following up at [facility] for continuing outpatient mental health services.  Her tentative  length of stay is 5-7 days.      [name], N.P.      Jasmine Pang, M.D.  Electronically Signed    JO/MEDQ  D:  07/02/2007  T:  07/02/2007  Job:  [id number]

## 2010-07-11 NOTE — H&P (Signed)
[name], HUGHETT           ACCOUNT NO.:  [id number]   MEDICAL RECORD NO.:  [id number]          PATIENT TYPE:  IPS   LOCATION:  0401                          FACILITY:  [facility]   PHYSICIAN:  Vic Ripper, P.A.-C.DATE OF BIRTH:  04-24-1985   DATE OF ADMISSION:  12/13/2007  DATE OF DISCHARGE:                       PSYCHIATRIC ADMISSION ASSESSMENT   This is a 25 year old single African American female.  The patient  presented to the [facility] yesterday.  Her chief complaint  was that she had overdosed.  She reported that she had taken  approximately 45 Oxycodone's.  However her urine drug screen did not  confirm this.  Her urine drug screen was positive however for illegal  substances mainly cocaine and marijuana.  This is not the first time  this patient has presented in this manner.  In fact, when last with [place]  back in May she did the same thing.  Today she is in bed.  She is  refusing to talk.  Earlier today she did talk with Dr. Lolly Mustache and at  that time she admitted that she had lied so that she could be admitted.  She denied having any paranoia or hallucinations although she appeared  very labile at the time she spoke with Dr. Lolly Mustache and she stated she did  not want to be on the 400 hall.   PAST PSYCHIATRIC HISTORY:  Unfortunately it is not easily obtained as  they have changed her medical record number but the patient has had  psychiatric problems all of her life beginning at age 13 when she  witnessed her mother's murder.   SOCIAL HISTORY:  Apparently she is unemployed.  She is receiving  disability and she lives with her aunt.   ALCOHOL AND DRUG HISTORY:  She does have marijuana and cocaine in her  urine at present.   ALLERGIES:  She has no known drug allergies.   MEDICAL PROBLEMS:  Today she is reporting a history for seizures,  asthma, ovarian cancer, a pituitary adenoma.  Her prior admission here  back in May does not make mention of any of these  medical diagnoses and  we have put a call through to [facility] where she  receives her monthly Haldol injection.  We will try to confirm these  medical illnesses as well as her Haldol dosage and date of last  administration.   Vital signs on admission show she is 64 inches tall, weighs 281,  temperature is 98.5, blood pressure is 137/95, pulse 79, respirations  are 18.  She has old scars on both forearms and abdomen and I am not  exactly sure as to the etiology of these scars.   MENTAL STATUS EXAM:  She would not respond to me for questioning so I  will go with her intake mental status exam.  Yesterday she stated that  she had had a dream that she was being chased and that she had taken  pills so the chasing would stop.  She reported ingesting 45 Oxycodone  tabs but as already stated there was no evidence to support that claim.  When seen yesterday  she was casually dressed and groomed.  Her eye  contact was fair.  Her motor was normal.  Her speech was normal.  Her  level of consciousness was drowsy.  Her mood was appropriate as was her  affect.  Her anxiety level was minimal and her thought processes were  coherent.  Her judgment was poor however she was fully oriented.  Insight was felt to be poor.  Impulse control was fair and her appetite  was intact.   Axis I:  Bipolar disorder NOS.  Polysubstance dependence.  Her diagnosis  when here back in May was major depressive disorder, recurrent severe.  Axis II:  Rule out borderline intellectual functioning.  Axis III:  Unclear.  She is reporting seizures, ovarian cancer, asthma,  etc. and we do not have any confirmation nor any medications for any of  these illnesses.  Axis IV:  Burden of illness.  Axis V:  25.   PLAN:  Admit for safety and stabilization.  We will confirm her  medications at [facility] [phone].  That call has  been placed.  We are awaiting a call back and we will adjust her   medications as indicated and give her any other supportive medications  as indicated.   ESTIMATED LENGTH OF STAY:  Hopefully will be brief, 3 to 5 days.      Vic Ripper, P.A.-C.     MD/MEDQ  D:  12/13/2007  T:  12/13/2007  Job:  [id number]

## 2010-07-11 NOTE — Discharge Summary (Signed)
[name], [name]           ACCOUNT NO.:  [id number]   MEDICAL RECORD NO.:  [id number]          PATIENT TYPE:  IPS   LOCATION:  0301                          FACILITY:  [facility]   PHYSICIAN:  Jasmine Pang, M.D. DATE OF BIRTH:  1985/05/11   DATE OF ADMISSION:  07/02/2007  DATE OF DISCHARGE:  07/06/2007                               DISCHARGE SUMMARY   IDENTIFYING INFORMATION:  A 25 year old female.  This is an involuntary  commitment.   HISTORY OF THE PRESENT ILLNESS:  The patient was petitioned in the  emergency room where she presented on the evening of May 6 after taking  an overdose of approximately 40 tablets of Vicodin 5/500.  She was fully  alert at the time so that her intention was to harm herself, said that  she was agitated and upset pending the upcoming Mother's Day Holiday and  thinking about her mother who had been killed in front of her when she  was 25 years old.  She also has been homeless and having a lot of extra  stress, had been living with a relative who had kicked her out for  reasons that are unclear.  She was denying any substance abuse, had been  prescribed the Vicodin tablets by her neurologist for headaches due to a  recent diagnosis of pituitary adenoma.   PAST PSYCHIATRIC HISTORY:  Second admission to [facility].  She has a history of one prior admission to our child  adolescent unit and is followed by Dr. Georges Lynch at [facility].   SOCIAL HISTORY:  Revealed a 25 year old single female who has 2 young  children ages 68-years-old and 71-year-old.  The children reside with  their father with whom she has a tenuous relationship, however she does  visit the children and has contact with them regularly.  She is on  disability for her mental illness.  No legal problems.  She is currently  homeless after being kicked out of the house that she was occupying with  her friend.  She denied any drug or alcohol use.   The patient endorses  abuse of marijuana, smokes it most every day, typically 1-2 blunts per  day, has no interest in giving it up, says it keeps her nerves calm.   MEDICAL PROBLEMS IDENTIFIED:  Include recent diagnosis of a pituitary  adenoma which the patient provided, we do not have confirmation of this,  as she is followed at [facility] and been prescribed  Vicodin as needed for headache pain and is due for surgery at the end of  this year.   PHYSICAL EXAM:  Was done in the emergency room and revealed an obese  African American female in no acute distress, 5 feet 4 inches tall, 293  pounds, temperature 98.3, pulse 77, respirations 20.   DIAGNOSTIC STUDIES:  Were done in the emergency room.  CBC was  unremarkable.  Acetaminophen level initially was 60 but on recheck was  down to 10.  CMET within normal limits with a normal urinalysis and a  negative pregnancy test.  Alcohol  level was less than 5.  Salicylate  level less than 4 and urine drug screen positive for cocaine and  marijuana.   ADMITTING MENTAL STATUS EXAM:  Revealed a cooperative patient wearing a  hospital gown, good eye contact.  Speech a little bit slowed.  Neutral  mood, sad affect, was endorsing some auditory hallucinations without  commands.  Does admit to being sad and grieving the death of her mother  and having some agitated thoughts when she recalls her mother being  killed.  Judgment and insight poor, memory intact.  Limited cognitive  abilities.   ADMITTING DIAGNOSES:  Axis I:  Major depressive disorder recurrent,  severe.  Axis II:  Deferred.  Axis III:  Status post opiate and Tylenol overdose.  Axis IV:  Homelessness.  Axis V:  Current 25-30.   COURSE OF HOSPITALIZATION:  The patient was admitted to our dual  diagnosis program, attended all groups.  She gave a variable history  about her homeless situation, said she had multiple relatives and  friends that she could go and live  with, but we were unable to validate  an actual safe plan for her.  She endorsed some depression, primarily  related to thinking about the death of her mother with the upcoming  holiday.  We continued her basic medications and she had reported  compliance with her monthly Haldol Decanoate injection.  We also  identified on Axis III candidal rash under both breasts and was started  on nystatin cream.  On the second day she was endorsing a desire to go  home, wanted to be able to see her children, displaying some poor  insight.  Although she was insisting on wanting to go home she could not  present any reasonable discharge plan and had nowhere to actually go.  By the 9th she had said that she wanted to go live with her grandfather  who is blind, lives in the community, that he wanted her to help with  him.  However, on calling the home, the primary caregiver advised that  the family did not want Faith to live there, that she was not welcome  there and that it would violate the grandfather's Section 8 housing  rules.  Adriahna at that point became agitated, verbally acted out, but  was readily redirected and became cooperative.  We also pursued some  additional resources including contacting the homeless shelter.  She at  that time was endorsing some irritability, feeling irritable primarily  about not being able to leave when she wanted to.  We were concerned  about her safety.  She was denying any suicidal thoughts.  By Sunday we  were able to contact her cousin.  The cousin agreed to let her come and  live there and she was anxious to leave on Sunday the 10th.  Meanwhile  she had been sleeping well, did not require any p.r.n. medications.  Did  not want to consider stopping her use of marijuana.  Even though we  confronted her with the possibility that the cocaine in her urine might  have been in the marijuana she was using, she seemed disinterested and  unconcerned.  She was anxious to  go home, having no dangerous thoughts,  was cooperative with peers and staff, attended all meetings.  Our nurse  was able to call and discuss with her cousin [place], that it was safe for  her to go there, and we decided to discharge her.   DISCHARGE MENTAL  STATUS EXAM:  Revealed a fully alert female in no  distress, oriented x4 and in full contact with reality.  Mood was  neutral.  Affect appropriate.  Speech normal.  Denying any dangerous  thoughts.  Not willing to consider stopping the marijuana.  Willing to  follow up for her monthly Haldol Decanoate shot at mental health.  Thinking was linear, goal directed.  No dangerous thoughts.  Cognition  fully intact.  Concentration poor, impulse control poor.  Insight poor.   DISCHARGE DIAGNOSES:  Axis I:  Depressive disorder, not otherwise  specified, cannabis abuse and dependence, cocaine abuse .  Axis II:  No diagnosis.  Axis III:  History of pituitary adenoma.  Axis IV:  New home situation with a cousin.  Axis V:  Current 54, past year 61 estimated.   PLAN:  Was to discharge the patient.  She will follow up with mental  health and our case manager is calling her Monday with her appointment  which will be noted on her discharge instructions.  The only medication  written at discharge was nystatin cream to affected area twice a day and  Haldol Decanoate 1 mL q. 4 weeks at mental health.      Margaret A. Lorin Picket, N.P.      Jasmine Pang, M.D.  Electronically Signed    MAS/MEDQ  D:  07/07/2007  T:  07/07/2007  Job:  [id number]

## 2010-07-11 NOTE — Discharge Summary (Signed)
NAMEMARIAFERNANDA, [name]           ACCOUNT NO.:  [id number]   MEDICAL RECORD NO.:  [id number]          PATIENT TYPE:  IPS   LOCATION:  0504                          FACILITY:  [facility]   PHYSICIAN:  Jasmine Pang, M.D. DATE OF BIRTH:  1986-01-12   DATE OF ADMISSION:  12/13/2007  DATE OF DISCHARGE:  12/15/2007                               DISCHARGE SUMMARY   IDENTIFICATION:  This is a 25 year old single African American female  who was admitted on December 13, 2007.   HISTORY OF PRESENT ILLNESS:  The patient presented to [facility].  On the day of admission, her chief complaint was that she had  overdosed.  She reported that she had taken approximately 45 oxycodone.  However, her urine drug screen did not confirm this.  Her urine drug  screen was positive for illegal substances, mainly cocaine and  marijuana.  When last with [place] in May, the patient had done the same  thing.  She admitted that she had lied, so she could be admitted to the  hospital.  She stated she wanted attention.  She denied any paranoia  or hallucinations.  She did not want to be in the 400 intensive care  unit hall and was asking for transfer.   PAST PSYCHIATRIC HISTORY:  The patient has had psychiatric problems all  her life, beginning at age 47 when she witnessed her mother's murmur.  She is a known sex offender.  She has had several admissions to our unit  in the past.   ALCOHOL AND DRUG HISTORY:  The patient does have marijuana and cocaine  in her urine at present.   ALLERGIES:  No known drug allergies.   MEDICAL PROBLEMS:  History of seizures, asthma, ovarian cancer, and  pituitary adenoma.   MEDICATIONS:  The patient receives Haldol Decanoate ?dose monthly.  The  [facility] was not able to provide [place] with the amount at the time  of admission.   PHYSICAL FINDINGS:  There were no acute physical or medical problems  noted.  The patient was medically stabilized in the ED prior to  admission  here.   HOSPITAL COURSE:  Upon admission, the patient was started on nicotine  patch 21 mg daily.  She was also started on trazodone 50 mg p.o. q.h.s.,  may repeat x1 and Haldol 2 mg p.o. q.6 hours p.r.n. agitation as well as  an albuterol inhaler 2 puffs q.4 hours p.r.n. shortness of breath.  In  individual sessions, the patient was initially reserved and was  resistant to talking.  She was noted to be somewhat angry and labile.  She was denying any suicidal ideation or homicidal ideation.  She  continued to maintain that she had made up the story about the overdose.  She continued to request to go home.  She contacted her case worker and  let him know that she was going to go home today.  Her aunt was called  to pick her up.  On December 15, 2007, sleep was good, appetite was good.  The patient continued to maintain that she lied about the overdose to  get attention.  Mood was euthymic.  Affect was consistent with mood.  There was no suicidal or homicidal ideation.  No thoughts of self-  injurious behavior.  No auditory or visual hallucinations.  No paranoia  or delusions.  Thoughts were logical and goal-directed, thought content.  No predominant theme.  Cognitive was grossly back to baseline.  Insight  was fair, judgment fair, impulse control fair.  It was felt the patient  was safe for discharge today.   DISCHARGE DIAGNOSES:  Axis I:  Bipolar disorder not otherwise specified.  Polysubstance abuse.  Axis II:  Rule out borderline intellectual functioning.  Axis III:  Unclear.  The patient is reporting seizures, ovarian cancer,  asthma, etc.  Axis IV:  Moderate (burden of illness).  Axis V:  Global assessment of functioning was 50 upon discharge.  GAF  was 25 upon admission.  GAF highest past year was 60.   DISCHARGE PLANS:  There was no specific activity level or dietary  restrictions.   POSTHOSPITAL CARE PLANS:  The patient will be seen at the [facility]  center on December 18, 2007, at 1:00 p.m.  She will also be followed by  the [facility] Support Group starting today on December 15, 2007.   DISCHARGE MEDICATIONS:  1. Trazodone 50 mg at bedtime.  2. Haldol 2 mg every 6 hours as needed for agitation, anxiety.  3. Haldol Decanoate as per [facility] schedule.      Jasmine Pang, M.D.  Electronically Signed     BHS/MEDQ  D:  12/15/2007  T:  12/16/2007  Job:  [id number]

## 2010-07-11 NOTE — Discharge Summary (Signed)
Brown, Donna           ACCOUNT NO.:  [id number]   MEDICAL RECORD NO.:  [id number]          PATIENT TYPE:  IPS   LOCATION:  0301                          FACILITY:  [facility]   PHYSICIAN:  Jasmine Pang, M.D. DATE OF BIRTH:  [birth year]   DATE OF ADMISSION:  07/02/2007  DATE OF DISCHARGE:  07/06/2007                               DISCHARGE SUMMARY   IDENTIFICATION:  This is a 25 year old single female who was admitted on  an involuntary basis on Jul 02, 2007.   HISTORY OF PRESENT ILLNESS:  The patient is here on petition.  The  papers state that the patient overdosed on narcotics in an attempt to  hurt herself.  The patient did state that she took medications that she  had and it was approximately 40 tablets of Vicodin 5/500.  She states  that her intention was to kill herself.  She had a reason she did this  as she has been thinking about her mother who was killed in front of her  when the patient was 25 years old.  She also reports hearing voices at  times.  She denies any current hallucination.  She also reports having  problems with a living situation.  Currently living with her cousin.  The patient also reports little social support.  She denies any  substance use.   PAST PSYCHIATRIC HISTORY:  The patient was in our facility before and on  the child adolescent unit.  She sees Dr. Rosalia Hammers at the [facility].   FAMILY HISTORY:  None.   ALCOHOL AND DRUG HISTORY:  No alcohol or drug use.  The patient is a  nonsmoker.   MEDICAL PROBLEMS:  No known acute or chronic health issues.  The patient  is obese.  She is currently 5 feet 4 inches and weight of 293 pounds.  She is also complaining of a rash under her right breast.   MEDICATIONS:  Haldol Decanoate monthly with her last injection being on  the April 17.  She is also on albuterol p.r.n. inhaler   DRUG ALLERGIES:  No known drug allergies.   PHYSICAL FINDINGS:  Again, this is an obese young female  who was fully  assessed at the [facility].  Her physical exam was  reviewed.  There were no acute physical or medical problems noted.   LABORATORY DATA:  Hemoglobin was 11.3, hematocrit was 32.9.  Acetaminophen level initially was 60 down to 10 prior to transfers.  CMET was within normal limits.  Urinalysis was negative.  Pregnancy test  was negative.  Alcohol level less than 5.  Salicylate level less than 4.  Urine drug screen was positive for cocaine and THC.   HOSPITAL COURSE:  Upon admission, the patient was started on nystatin  cream to her right breast b.i.d. x7 days.  She was also started on  Haldol 5 mg p.o. q.6 h. p.r.n. and albuterol inhaler 2 puffs q.4-6 h.  p.r.n.Marland Kitchen  She was started on ibuprofen 400 mg p.o. q.6 h. p.r.n. pain.  She was also started on Phenergan for nausea.  The  patient tolerated  these medications well, but no significant side effects.  In sessions  with me, the patient was initially reserved, but cooperative.  She  describes still being depressed and anxious.  She states she is homeless  now.  She has positive suicidal ideation.  No auditory or visual  hallucinations.  She was living with her cousin who was getting ready to  have the baby and she cannot live there anymore.  Sleep has been poor  and appetite has been within normal limits.  As hospitalization  progressed, she became less depressed and less anxious.  She still was  quite anergic.  She was focused on wanting to go home, but had no place  to live.  Sleep was good and appetite was good.  The patient also was  irritable at times revolving around another peer.  She got into an  argument with her, but now was able to be calmed down.  On Jul 06, 2007,  mental status had improved markedly from admission status.  The patient  was feeling alert.  There were no further behavioral outbursts.  Mood  was less depressed, less anxious.  Affect consistent with mood.  There  was no suicidal  or homicidal ideation.  No thoughts of self-injurious  behavior.  No auditory or visual hallucinations.  No paranoia or  delusions.  Thoughts were logical and goal-directed.  Thought content,  no predominant theme.  Cognitive was grossly back to baseline.  It was  felt the patient was safe for discharge.   DISCHARGE DIAGNOSES:  Axis I:  Major depressive disorder, recurrent,  severe.  Axis II:  None.  Axis III:  Status post opioid and Tylenol overdose.  Axis IV:  Other psychosocial problems - severe (problems due to housing  and lack of support, burden of psychiatric illness).  Axis V:  Global  assessment of functioning was 50 upon discharge.  GAF was 25-30 upon  admission.  GAF highest past year was 60.   DISCHARGE PLAN:  There was no specific activity level or dietary  restrictions.   POSTHOSPITAL CARE PLANS:  The patient will be seen at the [facility] on May 14th at 8:30 a.m.   DISCHARGE MEDICATIONS:  Nystatin cream to affected area twice a day,  Haldol Decanoate.   Follow up with mental health for your regular injection.      Jasmine Pang, M.D.  Electronically Signed     BHS/MEDQ  D:  08/11/2007  T:  08/12/2007  Job:  [id number]

## 2010-08-13 ENCOUNTER — Emergency Department (HOSPITAL_COMMUNITY)
Admission: EM | Admit: 2010-08-13 | Discharge: 2010-08-13 | Disposition: A | Discharge status: Home / Self Care | Payer: Medicaid Other | Attending: Emergency Medicine | Admitting: Emergency Medicine

## 2010-08-13 DIAGNOSIS — Z87442 Personal history of urinary calculi: Secondary | ICD-10-CM | POA: Insufficient documentation

## 2010-08-13 DIAGNOSIS — F319 Bipolar disorder, unspecified: Secondary | ICD-10-CM | POA: Insufficient documentation

## 2010-08-13 DIAGNOSIS — M549 Dorsalgia, unspecified: Secondary | ICD-10-CM | POA: Insufficient documentation

## 2010-08-13 DIAGNOSIS — R109 Unspecified abdominal pain: Secondary | ICD-10-CM | POA: Insufficient documentation

## 2010-08-13 LAB — URINALYSIS, ROUTINE W REFLEX MICROSCOPIC
Bilirubin Urine: NEGATIVE
Nitrite: NEGATIVE
Specific Gravity, Urine: 1.028 (ref 1.005–1.030)
Urobilinogen, UA: 1 mg/dL (ref 0.0–1.0)

## 2010-08-13 LAB — URINE MICROSCOPIC-ADD ON

## 2010-08-14 ENCOUNTER — Emergency Department (HOSPITAL_COMMUNITY)
Admission: EM | Admit: 2010-08-14 | Discharge: 2010-08-15 | Disposition: A | Discharge status: Home / Self Care | Payer: Medicaid Other | Attending: Emergency Medicine | Admitting: Emergency Medicine

## 2010-08-14 DIAGNOSIS — R05 Cough: Secondary | ICD-10-CM | POA: Insufficient documentation

## 2010-08-14 DIAGNOSIS — J069 Acute upper respiratory infection, unspecified: Secondary | ICD-10-CM | POA: Insufficient documentation

## 2010-08-14 DIAGNOSIS — R059 Cough, unspecified: Secondary | ICD-10-CM | POA: Insufficient documentation

## 2010-08-14 DIAGNOSIS — R42 Dizziness and giddiness: Secondary | ICD-10-CM | POA: Insufficient documentation

## 2010-08-14 DIAGNOSIS — R0789 Other chest pain: Secondary | ICD-10-CM | POA: Insufficient documentation

## 2010-08-14 DIAGNOSIS — J3489 Other specified disorders of nose and nasal sinuses: Secondary | ICD-10-CM | POA: Insufficient documentation

## 2010-08-14 DIAGNOSIS — J45909 Unspecified asthma, uncomplicated: Secondary | ICD-10-CM | POA: Insufficient documentation

## 2010-08-14 DIAGNOSIS — R109 Unspecified abdominal pain: Secondary | ICD-10-CM | POA: Insufficient documentation

## 2010-08-14 DIAGNOSIS — R112 Nausea with vomiting, unspecified: Secondary | ICD-10-CM | POA: Insufficient documentation

## 2010-08-14 LAB — POCT PREGNANCY, URINE: Preg Test, Ur: NEGATIVE

## 2010-08-15 ENCOUNTER — Emergency Department (HOSPITAL_COMMUNITY): Payer: Medicaid Other

## 2010-08-16 ENCOUNTER — Emergency Department (HOSPITAL_COMMUNITY)
Admission: EM | Admit: 2010-08-16 | Discharge: 2010-08-16 | Disposition: A | Discharge status: Home / Self Care | Payer: Medicaid Other | Attending: Emergency Medicine | Admitting: Emergency Medicine

## 2010-08-16 DIAGNOSIS — R0602 Shortness of breath: Secondary | ICD-10-CM | POA: Insufficient documentation

## 2010-08-16 DIAGNOSIS — G40909 Epilepsy, unspecified, not intractable, without status epilepticus: Secondary | ICD-10-CM | POA: Insufficient documentation

## 2010-08-16 DIAGNOSIS — Z79899 Other long term (current) drug therapy: Secondary | ICD-10-CM | POA: Insufficient documentation

## 2010-08-16 DIAGNOSIS — J45909 Unspecified asthma, uncomplicated: Secondary | ICD-10-CM | POA: Insufficient documentation

## 2010-09-04 ENCOUNTER — Emergency Department (HOSPITAL_COMMUNITY)
Admission: EM | Admit: 2010-09-04 | Discharge: 2010-09-04 | Disposition: A | Discharge status: Home / Self Care | Payer: Medicaid Other | Attending: Emergency Medicine | Admitting: Emergency Medicine

## 2010-09-04 DIAGNOSIS — F411 Generalized anxiety disorder: Secondary | ICD-10-CM | POA: Insufficient documentation

## 2010-09-04 DIAGNOSIS — F319 Bipolar disorder, unspecified: Secondary | ICD-10-CM | POA: Insufficient documentation

## 2010-09-04 DIAGNOSIS — M545 Low back pain, unspecified: Secondary | ICD-10-CM | POA: Insufficient documentation

## 2010-09-23 ENCOUNTER — Emergency Department (HOSPITAL_COMMUNITY)
Admission: EM | Admit: 2010-09-23 | Discharge: 2010-09-23 | Disposition: A | Discharge status: Home / Self Care | Payer: Medicaid Other | Attending: Emergency Medicine | Admitting: Emergency Medicine

## 2010-09-23 DIAGNOSIS — G40909 Epilepsy, unspecified, not intractable, without status epilepticus: Secondary | ICD-10-CM | POA: Insufficient documentation

## 2010-09-23 DIAGNOSIS — N390 Urinary tract infection, site not specified: Secondary | ICD-10-CM | POA: Insufficient documentation

## 2010-09-23 DIAGNOSIS — R109 Unspecified abdominal pain: Secondary | ICD-10-CM | POA: Insufficient documentation

## 2010-09-23 DIAGNOSIS — R112 Nausea with vomiting, unspecified: Secondary | ICD-10-CM | POA: Insufficient documentation

## 2010-09-23 DIAGNOSIS — I10 Essential (primary) hypertension: Secondary | ICD-10-CM | POA: Insufficient documentation

## 2010-09-23 DIAGNOSIS — R197 Diarrhea, unspecified: Secondary | ICD-10-CM | POA: Insufficient documentation

## 2010-09-23 LAB — CBC
HCT: 33 % — ABNORMAL LOW (ref 36.0–46.0)
Hemoglobin: 10.4 g/dL — ABNORMAL LOW (ref 12.0–15.0)
RBC: 3.53 MIL/uL — ABNORMAL LOW (ref 3.87–5.11)
WBC: 8.4 10*3/uL (ref 4.0–10.5)

## 2010-09-23 LAB — BASIC METABOLIC PANEL
CO2: 25 mEq/L (ref 19–32)
Calcium: 9.3 mg/dL (ref 8.4–10.5)
Creatinine, Ser: 1.11 mg/dL — ABNORMAL HIGH (ref 0.50–1.10)
GFR calc non Af Amer: 60 mL/min — ABNORMAL LOW (ref >60)

## 2010-09-23 LAB — DIFFERENTIAL
Basophils Absolute: 0 10*3/uL (ref 0.0–0.1)
Lymphocytes Relative: 30 % (ref 12–46)
Monocytes Absolute: 0.6 10*3/uL (ref 0.1–1.0)
Neutro Abs: 5.2 10*3/uL (ref 1.7–7.7)

## 2010-09-23 LAB — URINE MICROSCOPIC-ADD ON

## 2010-09-23 LAB — URINALYSIS, ROUTINE W REFLEX MICROSCOPIC
Glucose, UA: NEGATIVE mg/dL
Hgb urine dipstick: NEGATIVE
Specific Gravity, Urine: 1.027 (ref 1.005–1.030)
pH: 6.5 (ref 5.0–8.0)

## 2010-09-30 ENCOUNTER — Emergency Department (HOSPITAL_COMMUNITY)
Admission: EM | Admit: 2010-09-30 | Discharge: 2010-09-30 | Disposition: A | Discharge status: Home / Self Care | Payer: Medicaid Other | Attending: Emergency Medicine | Admitting: Emergency Medicine

## 2010-09-30 DIAGNOSIS — N390 Urinary tract infection, site not specified: Secondary | ICD-10-CM | POA: Insufficient documentation

## 2010-09-30 DIAGNOSIS — G40909 Epilepsy, unspecified, not intractable, without status epilepticus: Secondary | ICD-10-CM | POA: Insufficient documentation

## 2010-09-30 DIAGNOSIS — I10 Essential (primary) hypertension: Secondary | ICD-10-CM | POA: Insufficient documentation

## 2010-09-30 LAB — URINALYSIS, ROUTINE W REFLEX MICROSCOPIC
Hgb urine dipstick: NEGATIVE
Urobilinogen, UA: 1 mg/dL (ref 0.0–1.0)
pH: 7 (ref 5.0–8.0)

## 2010-09-30 LAB — POCT PREGNANCY, URINE: Preg Test, Ur: NEGATIVE

## 2010-09-30 LAB — URINE MICROSCOPIC-ADD ON

## 2010-10-01 LAB — URINE CULTURE: Colony Count: 55000

## 2010-10-05 ENCOUNTER — Emergency Department (HOSPITAL_COMMUNITY)
Admission: EM | Admit: 2010-10-05 | Discharge: 2010-10-05 | Disposition: A | Discharge status: Home / Self Care | Payer: Medicaid Other | Attending: Emergency Medicine | Admitting: Emergency Medicine

## 2010-10-05 DIAGNOSIS — N390 Urinary tract infection, site not specified: Secondary | ICD-10-CM | POA: Insufficient documentation

## 2010-10-05 DIAGNOSIS — I10 Essential (primary) hypertension: Secondary | ICD-10-CM | POA: Insufficient documentation

## 2010-10-05 DIAGNOSIS — R300 Dysuria: Secondary | ICD-10-CM | POA: Insufficient documentation

## 2010-10-05 DIAGNOSIS — G40909 Epilepsy, unspecified, not intractable, without status epilepticus: Secondary | ICD-10-CM | POA: Insufficient documentation

## 2010-10-05 LAB — URINALYSIS, ROUTINE W REFLEX MICROSCOPIC
Bilirubin Urine: NEGATIVE
Hgb urine dipstick: NEGATIVE
Ketones, ur: NEGATIVE mg/dL
Nitrite: NEGATIVE
Protein, ur: NEGATIVE mg/dL
Specific Gravity, Urine: 1.028 (ref 1.005–1.030)
Urobilinogen, UA: 1 mg/dL (ref 0.0–1.0)

## 2010-10-05 LAB — PREGNANCY, URINE: Preg Test, Ur: NEGATIVE

## 2010-10-05 LAB — URINE MICROSCOPIC-ADD ON

## 2010-11-05 ENCOUNTER — Emergency Department (HOSPITAL_COMMUNITY): Payer: Medicaid Other

## 2010-11-05 ENCOUNTER — Emergency Department (HOSPITAL_COMMUNITY)
Admission: EM | Admit: 2010-11-05 | Discharge: 2010-11-06 | Disposition: A | Discharge status: Home / Self Care | Payer: Medicaid Other | Attending: Emergency Medicine | Admitting: Emergency Medicine

## 2010-11-05 DIAGNOSIS — IMO0002 Reserved for concepts with insufficient information to code with codable children: Secondary | ICD-10-CM | POA: Insufficient documentation

## 2010-11-05 DIAGNOSIS — R51 Headache: Secondary | ICD-10-CM | POA: Insufficient documentation

## 2010-11-05 DIAGNOSIS — S0003XA Contusion of scalp, initial encounter: Secondary | ICD-10-CM | POA: Insufficient documentation

## 2010-11-05 DIAGNOSIS — M542 Cervicalgia: Secondary | ICD-10-CM | POA: Insufficient documentation

## 2010-11-05 DIAGNOSIS — S1093XA Contusion of unspecified part of neck, initial encounter: Secondary | ICD-10-CM | POA: Insufficient documentation

## 2010-11-05 DIAGNOSIS — G40909 Epilepsy, unspecified, not intractable, without status epilepticus: Secondary | ICD-10-CM | POA: Insufficient documentation

## 2010-11-05 DIAGNOSIS — S01501A Unspecified open wound of lip, initial encounter: Secondary | ICD-10-CM | POA: Insufficient documentation

## 2010-11-05 DIAGNOSIS — I10 Essential (primary) hypertension: Secondary | ICD-10-CM | POA: Insufficient documentation

## 2010-11-12 ENCOUNTER — Emergency Department (HOSPITAL_COMMUNITY)
Admission: EM | Admit: 2010-11-12 | Discharge: 2010-11-12 | Disposition: A | Discharge status: Home / Self Care | Payer: Medicaid Other | Attending: Emergency Medicine | Admitting: Emergency Medicine

## 2010-11-12 DIAGNOSIS — F209 Schizophrenia, unspecified: Secondary | ICD-10-CM | POA: Insufficient documentation

## 2010-11-12 DIAGNOSIS — F319 Bipolar disorder, unspecified: Secondary | ICD-10-CM | POA: Insufficient documentation

## 2010-11-12 DIAGNOSIS — Z79899 Other long term (current) drug therapy: Secondary | ICD-10-CM | POA: Insufficient documentation

## 2010-11-12 DIAGNOSIS — F1910 Other psychoactive substance abuse, uncomplicated: Secondary | ICD-10-CM | POA: Insufficient documentation

## 2010-11-12 DIAGNOSIS — I10 Essential (primary) hypertension: Secondary | ICD-10-CM | POA: Insufficient documentation

## 2010-11-12 LAB — BASIC METABOLIC PANEL
CO2: 25 mEq/L (ref 19–32)
Calcium: 9.1 mg/dL (ref 8.4–10.5)
Chloride: 103 mEq/L (ref 96–112)
Creatinine, Ser: 0.88 mg/dL (ref 0.50–1.10)
Glucose, Bld: 137 mg/dL — ABNORMAL HIGH (ref 70–99)

## 2010-11-12 LAB — URINALYSIS, ROUTINE W REFLEX MICROSCOPIC
Glucose, UA: NEGATIVE mg/dL
Ketones, ur: NEGATIVE mg/dL
Protein, ur: NEGATIVE mg/dL
Urobilinogen, UA: 0.2 mg/dL (ref 0.0–1.0)

## 2010-11-12 LAB — URINE MICROSCOPIC-ADD ON

## 2010-11-12 LAB — DIFFERENTIAL
Basophils Relative: 0 % (ref 0–1)
Lymphocytes Relative: 42 % (ref 12–46)
Lymphs Abs: 3.2 10*3/uL (ref 0.7–4.0)
Monocytes Absolute: 0.4 10*3/uL (ref 0.1–1.0)
Monocytes Relative: 5 % (ref 3–12)
Neutro Abs: 4 10*3/uL (ref 1.7–7.7)
Neutrophils Relative %: 52 % (ref 43–77)

## 2010-11-12 LAB — CBC
HCT: 35 % — ABNORMAL LOW (ref 36.0–46.0)
Hemoglobin: 11.3 g/dL — ABNORMAL LOW (ref 12.0–15.0)
MCH: 29.9 pg (ref 26.0–34.0)
MCHC: 32.3 g/dL (ref 30.0–36.0)
MCV: 92.6 fL (ref 78.0–100.0)
RBC: 3.78 MIL/uL — ABNORMAL LOW (ref 3.87–5.11)

## 2010-11-12 LAB — RAPID URINE DRUG SCREEN, HOSP PERFORMED
Amphetamines: NOT DETECTED
Benzodiazepines: NOT DETECTED
Tetrahydrocannabinol: POSITIVE — AB

## 2010-11-12 LAB — ETHANOL: Alcohol, Ethyl (B): <11 mg/dL (ref 0–11)

## 2010-11-12 LAB — POCT PREGNANCY, URINE: Preg Test, Ur: NEGATIVE

## 2010-11-16 LAB — GC/CHLAMYDIA PROBE AMP, GENITAL
Chlamydia, DNA Probe: NEGATIVE
GC Probe Amp, Genital: NEGATIVE

## 2010-11-16 LAB — POCT URINALYSIS DIP (DEVICE)
Ketones, ur: 15 — AB
Operator id: 247071
Specific Gravity, Urine: 1.02

## 2010-11-16 LAB — POCT PREGNANCY, URINE
Operator id: 247071
Preg Test, Ur: NEGATIVE

## 2010-11-16 LAB — WET PREP, GENITAL: Clue Cells Wet Prep HPF POC: NONE SEEN

## 2010-11-18 ENCOUNTER — Emergency Department (HOSPITAL_COMMUNITY)
Admission: EM | Admit: 2010-11-18 | Discharge: 2010-11-18 | Disposition: A | Discharge status: Home / Self Care | Payer: Medicaid Other | Attending: Emergency Medicine | Admitting: Emergency Medicine

## 2010-11-18 DIAGNOSIS — R112 Nausea with vomiting, unspecified: Secondary | ICD-10-CM | POA: Insufficient documentation

## 2010-11-18 DIAGNOSIS — R109 Unspecified abdominal pain: Secondary | ICD-10-CM | POA: Insufficient documentation

## 2010-11-18 DIAGNOSIS — I10 Essential (primary) hypertension: Secondary | ICD-10-CM | POA: Insufficient documentation

## 2010-11-18 LAB — URINALYSIS, ROUTINE W REFLEX MICROSCOPIC
Glucose, UA: NEGATIVE mg/dL
Hgb urine dipstick: NEGATIVE
Specific Gravity, Urine: 1.022 (ref 1.005–1.030)
pH: 7 (ref 5.0–8.0)

## 2010-11-18 LAB — URINE MICROSCOPIC-ADD ON

## 2010-11-20 LAB — URINE CULTURE

## 2010-11-21 LAB — URINE MICROSCOPIC-ADD ON

## 2010-11-21 LAB — DIFFERENTIAL
Basophils Absolute: 0.1
Basophils Relative: 1
Eosinophils Absolute: 0.1
Eosinophils Absolute: 0.1
Eosinophils Relative: 1
Lymphocytes Relative: 23
Lymphs Abs: 3.2
Monocytes Absolute: 0.5
Monocytes Relative: 3
Monocytes Relative: 5
Neutrophils Relative %: 73

## 2010-11-21 LAB — URINE CULTURE

## 2010-11-21 LAB — CBC
HCT: 33.9 — ABNORMAL LOW
HCT: 36.2
Hemoglobin: 12.2
MCHC: 33.7
MCV: 91.7
MCV: 92.2
Platelets: 373
RBC: 3.7 — ABNORMAL LOW
RBC: 3.93
RDW: 13
WBC: 13.8 — ABNORMAL HIGH

## 2010-11-21 LAB — URINALYSIS, ROUTINE W REFLEX MICROSCOPIC
Bilirubin Urine: NEGATIVE
Glucose, UA: NEGATIVE
Ketones, ur: NEGATIVE
Nitrite: NEGATIVE
Protein, ur: NEGATIVE
Specific Gravity, Urine: 1.022
pH: 7

## 2010-11-21 LAB — POCT I-STAT, CHEM 8
BUN: 9
Calcium, Ion: 1.22
Chloride: 103
Creatinine, Ser: 1
Glucose, Bld: 92
Glucose, Bld: 99
HCT: 39
Hemoglobin: 12.2
Potassium: 3.6
Sodium: 141
TCO2: 27

## 2010-11-21 LAB — PREGNANCY, URINE: Preg Test, Ur: NEGATIVE

## 2010-11-21 LAB — GC/CHLAMYDIA PROBE AMP, GENITAL
Chlamydia, DNA Probe: NEGATIVE
GC Probe Amp, Genital: NEGATIVE

## 2010-11-23 LAB — COMPREHENSIVE METABOLIC PANEL
ALT: 17
AST: 21
Alkaline Phosphatase: 80
CO2: 27
Calcium: 8.9
Calcium: 9.3
Creatinine, Ser: 0.87
GFR calc Af Amer: >60
GFR calc Af Amer: >60
GFR calc non Af Amer: >60
Glucose, Bld: 99
Potassium: 4.1
Sodium: 136
Sodium: 140
Total Protein: 6.9
Total Protein: 7

## 2010-11-23 LAB — URINALYSIS, ROUTINE W REFLEX MICROSCOPIC
Bilirubin Urine: NEGATIVE
Bilirubin Urine: NEGATIVE
Bilirubin Urine: NEGATIVE
Glucose, UA: NEGATIVE
Glucose, UA: NEGATIVE
Glucose, UA: NEGATIVE
Hgb urine dipstick: NEGATIVE
Hgb urine dipstick: NEGATIVE
Ketones, ur: NEGATIVE
Ketones, ur: NEGATIVE
Nitrite: NEGATIVE
Nitrite: NEGATIVE
Protein, ur: NEGATIVE
Protein, ur: 30 — AB
Protein, ur: NEGATIVE
Specific Gravity, Urine: 1.024
Urobilinogen, UA: 0.2
Urobilinogen, UA: 1
pH: 6.5
pH: 6.5
pH: 7

## 2010-11-23 LAB — DIFFERENTIAL
Eosinophils Absolute: 0.2
Eosinophils Relative: 2
Lymphocytes Relative: 36
Lymphs Abs: 2.3
Lymphs Abs: 3
Monocytes Absolute: 0.4
Monocytes Relative: 3
Monocytes Relative: 4
Neutro Abs: 4.9
Neutrophils Relative %: 59

## 2010-11-23 LAB — URINE MICROSCOPIC-ADD ON

## 2010-11-23 LAB — RAPID URINE DRUG SCREEN, HOSP PERFORMED
Barbiturates: NOT DETECTED
Benzodiazepines: NOT DETECTED
Opiates: NOT DETECTED

## 2010-11-23 LAB — CBC
Hemoglobin: 12.1
MCHC: 33.2
MCHC: 33.4
MCV: 91.8
RBC: 3.8 — ABNORMAL LOW
RBC: 3.98
RDW: 13.3
RDW: 13.4

## 2010-11-23 LAB — PREGNANCY, URINE
Preg Test, Ur: NEGATIVE
Preg Test, Ur: NEGATIVE

## 2010-11-23 LAB — POCT PREGNANCY, URINE: Preg Test, Ur: NEGATIVE

## 2010-11-23 LAB — POCT I-STAT, CHEM 8
Calcium, Ion: 1.18
Chloride: 106
Creatinine, Ser: 0.9
Glucose, Bld: 103 — ABNORMAL HIGH
Hemoglobin: 12.6
Potassium: 3.5

## 2010-11-25 ENCOUNTER — Emergency Department (HOSPITAL_COMMUNITY)
Admission: EM | Admit: 2010-11-25 | Discharge: 2010-11-25 | Disposition: A | Discharge status: Home / Self Care | Payer: Medicaid Other | Attending: Emergency Medicine | Admitting: Emergency Medicine

## 2010-11-25 ENCOUNTER — Emergency Department (HOSPITAL_COMMUNITY): Payer: Medicaid Other

## 2010-11-25 DIAGNOSIS — R109 Unspecified abdominal pain: Secondary | ICD-10-CM | POA: Insufficient documentation

## 2010-11-25 DIAGNOSIS — N390 Urinary tract infection, site not specified: Secondary | ICD-10-CM | POA: Insufficient documentation

## 2010-11-25 DIAGNOSIS — R112 Nausea with vomiting, unspecified: Secondary | ICD-10-CM | POA: Insufficient documentation

## 2010-11-25 DIAGNOSIS — R319 Hematuria, unspecified: Secondary | ICD-10-CM | POA: Insufficient documentation

## 2010-11-25 DIAGNOSIS — I10 Essential (primary) hypertension: Secondary | ICD-10-CM | POA: Insufficient documentation

## 2010-11-25 DIAGNOSIS — Z87442 Personal history of urinary calculi: Secondary | ICD-10-CM | POA: Insufficient documentation

## 2010-11-25 LAB — URINALYSIS, ROUTINE W REFLEX MICROSCOPIC
Bilirubin Urine: NEGATIVE
Hgb urine dipstick: NEGATIVE
Specific Gravity, Urine: 1.015 (ref 1.005–1.030)
pH: 6.5 (ref 5.0–8.0)

## 2010-11-25 LAB — CBC
Hemoglobin: 10.9 g/dL — ABNORMAL LOW (ref 12.0–15.0)
MCHC: 32 g/dL (ref 30.0–36.0)
RBC: 3.67 MIL/uL — ABNORMAL LOW (ref 3.87–5.11)

## 2010-11-25 LAB — DIFFERENTIAL
Basophils Absolute: 0 10*3/uL (ref 0.0–0.1)
Basophils Relative: 0 % (ref 0–1)
Monocytes Absolute: 0.4 10*3/uL (ref 0.1–1.0)
Neutro Abs: 5.3 10*3/uL (ref 1.7–7.7)
Neutrophils Relative %: 58 % (ref 43–77)

## 2010-11-25 LAB — POCT I-STAT, CHEM 8
HCT: 36 % (ref 36.0–46.0)
Hemoglobin: 12.2 g/dL (ref 12.0–15.0)
Sodium: 142 mEq/L (ref 135–145)
TCO2: 25 mmol/L (ref 0–100)

## 2010-11-25 LAB — URINE MICROSCOPIC-ADD ON

## 2010-11-26 LAB — URINE CULTURE

## 2010-11-27 LAB — RAPID URINE DRUG SCREEN, HOSP PERFORMED
Amphetamines: NOT DETECTED
Barbiturates: NOT DETECTED
Benzodiazepines: NOT DETECTED
Cocaine: POSITIVE — AB
Opiates: NOT DETECTED

## 2010-11-27 LAB — URINALYSIS, ROUTINE W REFLEX MICROSCOPIC
Bilirubin Urine: NEGATIVE
Bilirubin Urine: NEGATIVE
Hgb urine dipstick: NEGATIVE
Nitrite: NEGATIVE
Protein, ur: NEGATIVE
Specific Gravity, Urine: 1.025
Specific Gravity, Urine: 1.031 — ABNORMAL HIGH
Urobilinogen, UA: 0.2
Urobilinogen, UA: 1

## 2010-11-27 LAB — URINE CULTURE: Colony Count: 50000

## 2010-11-27 LAB — CBC
HCT: 35.3 — ABNORMAL LOW
Hemoglobin: 11.6 — ABNORMAL LOW
MCHC: 32.8
MCV: 93.4
RBC: 3.78 — ABNORMAL LOW

## 2010-11-27 LAB — COMPREHENSIVE METABOLIC PANEL
BUN: 13
CO2: 26
Calcium: 9.2
Creatinine, Ser: 1
GFR calc non Af Amer: >60
Glucose, Bld: 100 — ABNORMAL HIGH
Total Bilirubin: 0.6

## 2010-11-27 LAB — DIFFERENTIAL
Basophils Absolute: 0.1
Lymphocytes Relative: 45
Lymphs Abs: 3.9
Neutrophils Relative %: 47

## 2010-11-27 LAB — POCT I-STAT, CHEM 8
BUN: 13
Creatinine, Ser: 1
Glucose, Bld: 93
Hemoglobin: 12.6
Potassium: 4
Sodium: 140

## 2010-11-27 LAB — URINE MICROSCOPIC-ADD ON

## 2010-11-27 LAB — PREGNANCY, URINE: Preg Test, Ur: NEGATIVE

## 2010-11-27 LAB — SALICYLATE LEVEL: Salicylate Lvl: <4

## 2010-11-28 LAB — URINALYSIS, ROUTINE W REFLEX MICROSCOPIC
Glucose, UA: NEGATIVE
Hgb urine dipstick: NEGATIVE
Protein, ur: NEGATIVE
Specific Gravity, Urine: 1.03
pH: 6.5

## 2010-11-28 LAB — DIFFERENTIAL
Lymphocytes Relative: 37
Lymphs Abs: 3.5
Monocytes Relative: 5
Neutro Abs: 5.3
Neutrophils Relative %: 56

## 2010-11-28 LAB — POCT I-STAT, CHEM 8
Calcium, Ion: 1.19
Chloride: 107
Glucose, Bld: 105 — ABNORMAL HIGH
HCT: 38
TCO2: 27

## 2010-11-28 LAB — CBC
MCV: 93.5
Platelets: 337
RBC: 3.72 — ABNORMAL LOW
WBC: 9.5

## 2010-11-28 LAB — URINE MICROSCOPIC-ADD ON

## 2010-11-28 LAB — URINE CULTURE: Colony Count: >=100000

## 2010-11-28 LAB — POCT PREGNANCY, URINE: Preg Test, Ur: NEGATIVE

## 2010-12-04 ENCOUNTER — Emergency Department (HOSPITAL_COMMUNITY)
Admission: EM | Admit: 2010-12-04 | Discharge: 2010-12-05 | Disposition: A | Discharge status: Home / Self Care | Payer: Medicaid Other | Attending: Emergency Medicine | Admitting: Emergency Medicine

## 2010-12-04 DIAGNOSIS — M549 Dorsalgia, unspecified: Secondary | ICD-10-CM | POA: Insufficient documentation

## 2010-12-04 DIAGNOSIS — I10 Essential (primary) hypertension: Secondary | ICD-10-CM | POA: Insufficient documentation

## 2010-12-06 ENCOUNTER — Emergency Department (HOSPITAL_COMMUNITY)
Admission: EM | Admit: 2010-12-06 | Discharge: 2010-12-06 | Disposition: A | Discharge status: Home / Self Care | Payer: Medicaid Other | Attending: Emergency Medicine | Admitting: Emergency Medicine

## 2010-12-06 DIAGNOSIS — I10 Essential (primary) hypertension: Secondary | ICD-10-CM | POA: Insufficient documentation

## 2010-12-06 DIAGNOSIS — R0609 Other forms of dyspnea: Secondary | ICD-10-CM | POA: Insufficient documentation

## 2010-12-06 DIAGNOSIS — R05 Cough: Secondary | ICD-10-CM | POA: Insufficient documentation

## 2010-12-06 DIAGNOSIS — R059 Cough, unspecified: Secondary | ICD-10-CM | POA: Insufficient documentation

## 2010-12-06 DIAGNOSIS — R0989 Other specified symptoms and signs involving the circulatory and respiratory systems: Secondary | ICD-10-CM | POA: Insufficient documentation

## 2010-12-06 DIAGNOSIS — J40 Bronchitis, not specified as acute or chronic: Secondary | ICD-10-CM | POA: Insufficient documentation

## 2010-12-07 LAB — URINE CULTURE: Culture: NO GROWTH

## 2010-12-07 LAB — URINALYSIS, ROUTINE W REFLEX MICROSCOPIC
Glucose, UA: NEGATIVE
Nitrite: NEGATIVE
Protein, ur: NEGATIVE
Protein, ur: NEGATIVE
Specific Gravity, Urine: 1.025
Specific Gravity, Urine: 1.025
Urobilinogen, UA: 1
Urobilinogen, UA: 1

## 2010-12-07 LAB — URINE MICROSCOPIC-ADD ON

## 2010-12-07 LAB — PREGNANCY, URINE: Preg Test, Ur: NEGATIVE

## 2010-12-11 ENCOUNTER — Other Ambulatory Visit: Payer: Self-pay | Admitting: Family Medicine

## 2010-12-11 ENCOUNTER — Other Ambulatory Visit (HOSPITAL_COMMUNITY)
Admission: RE | Admit: 2010-12-11 | Discharge: 2010-12-11 | Disposition: A | Discharge status: Home / Self Care | Payer: Medicaid Other | Source: Ambulatory Visit | Attending: Family Medicine | Admitting: Family Medicine

## 2010-12-11 DIAGNOSIS — Z01419 Encounter for gynecological examination (general) (routine) without abnormal findings: Secondary | ICD-10-CM | POA: Insufficient documentation

## 2010-12-13 LAB — URINE MICROSCOPIC-ADD ON

## 2010-12-13 LAB — URINALYSIS, ROUTINE W REFLEX MICROSCOPIC
Bilirubin Urine: NEGATIVE
Bilirubin Urine: NEGATIVE
Nitrite: NEGATIVE
Protein, ur: NEGATIVE
Protein, ur: NEGATIVE
Specific Gravity, Urine: 1.025
Urobilinogen, UA: 1
Urobilinogen, UA: 1

## 2010-12-13 LAB — PREGNANCY, URINE: Preg Test, Ur: NEGATIVE

## 2010-12-24 ENCOUNTER — Emergency Department (HOSPITAL_COMMUNITY)
Admission: EM | Admit: 2010-12-24 | Discharge: 2010-12-24 | Disposition: A | Discharge status: Home / Self Care | Payer: Medicaid Other | Attending: Diagnostic Radiology | Admitting: Diagnostic Radiology

## 2010-12-24 DIAGNOSIS — Z79899 Other long term (current) drug therapy: Secondary | ICD-10-CM | POA: Insufficient documentation

## 2010-12-24 DIAGNOSIS — F4310 Post-traumatic stress disorder, unspecified: Secondary | ICD-10-CM | POA: Insufficient documentation

## 2010-12-24 DIAGNOSIS — F319 Bipolar disorder, unspecified: Secondary | ICD-10-CM | POA: Insufficient documentation

## 2010-12-24 DIAGNOSIS — L03039 Cellulitis of unspecified toe: Secondary | ICD-10-CM | POA: Insufficient documentation

## 2010-12-24 DIAGNOSIS — Z8543 Personal history of malignant neoplasm of ovary: Secondary | ICD-10-CM | POA: Insufficient documentation

## 2010-12-24 DIAGNOSIS — G40802 Other epilepsy, not intractable, without status epilepticus: Secondary | ICD-10-CM | POA: Insufficient documentation

## 2011-01-08 ENCOUNTER — Encounter (HOSPITAL_COMMUNITY): Payer: Self-pay | Admitting: *Deleted

## 2011-01-08 ENCOUNTER — Emergency Department (HOSPITAL_COMMUNITY)
Admission: EM | Admit: 2011-01-08 | Discharge: 2011-01-08 | Disposition: A | Discharge status: Home / Self Care | Payer: Medicaid Other | Attending: Emergency Medicine | Admitting: Emergency Medicine

## 2011-01-08 DIAGNOSIS — F17200 Nicotine dependence, unspecified, uncomplicated: Secondary | ICD-10-CM | POA: Insufficient documentation

## 2011-01-08 DIAGNOSIS — B351 Tinea unguium: Secondary | ICD-10-CM | POA: Insufficient documentation

## 2011-01-08 DIAGNOSIS — IMO0002 Reserved for concepts with insufficient information to code with codable children: Secondary | ICD-10-CM

## 2011-01-08 DIAGNOSIS — X58XXXA Exposure to other specified factors, initial encounter: Secondary | ICD-10-CM | POA: Insufficient documentation

## 2011-01-08 DIAGNOSIS — S91109A Unspecified open wound of unspecified toe(s) without damage to nail, initial encounter: Secondary | ICD-10-CM | POA: Insufficient documentation

## 2011-01-08 HISTORY — DX: Other generalized epilepsy and epileptic syndromes, not intractable, without status epilepticus: G40.409

## 2011-01-08 HISTORY — DX: Calculus of kidney: N20.0

## 2011-01-08 HISTORY — DX: Neoplasm of unspecified behavior of brain: D49.6

## 2011-01-08 MED ORDER — OXYCODONE-ACETAMINOPHEN 5-325 MG PO TABS: 1.0000 | ORAL_TABLET | Freq: Four times a day (QID) | ORAL | Status: AC | PRN

## 2011-01-08 MED ORDER — LIDOCAINE HCL 2 % IJ SOLN
INTRAMUSCULAR | Status: AC
  Filled 2011-01-08: qty 1

## 2011-01-08 MED ORDER — OXYCODONE-ACETAMINOPHEN 5-325 MG PO TABS
1.0000 | ORAL_TABLET | Freq: Once | ORAL | Status: AC
  Administered 2011-01-08: 1 via ORAL
  Filled 2011-01-08: qty 1

## 2011-01-08 MED ORDER — LIDOCAINE HCL (PF) 2 % IJ SOLN
10.0000 mL | Freq: Once | INTRAMUSCULAR | Status: DC
  Filled 2011-01-08: qty 10

## 2011-01-08 NOTE — ED Notes (Signed)
Pt. Was seen here several weeks ago for swollen, right great toe.  Pt. Reports pain continues to be a 10, with the area slightly swollen and reddened.  Pt. Reports toenail is loose and she would like the toenail removed.

## 2011-01-08 NOTE — ED Provider Notes (Signed)
Medical screening examination/treatment/procedure(s) were performed by non-physician practitioner and as supervising physician I was immediately available for consultation/collaboration.   Nivia Gervase R Yalena Colon, MD 01/08/11 2358 

## 2011-01-08 NOTE — Discharge Instructions (Signed)
Return here for any worsening in her condition, followup with your regular doctor for recheck of your toe.  Use warm soaks on her toes well

## 2011-01-08 NOTE — ED Notes (Signed)
Pt states "my toe has been like x 4 wks, was seen x 2 wks ago here, was told I could have the toe nail removed or have it removed, can you get them to take it off so I can go home"

## 2011-01-08 NOTE — ED Provider Notes (Signed)
History     CSN: [id number] Arrival date & time: 01/08/2011  3:10 PM   First MD Initiated Contact with Patient 01/08/11 1603      Chief Complaint  Patient presents with  . Toe Pain    (Consider location/radiation/quality/duration/timing/severity/associated sxs/prior treatment) HPI Patient comes in today because she wants to return it removed, because she says, that it is coming off already.  The patient was here several weeks ago and I told her I removed, which she declined at that time.  Patient has no swelling or redness of her to the toe or foot, states it is painful however.  She denies fever, nausea, vomiting, weakness, or redness of the foot. Past Medical History  Diagnosis Date  . 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 Brain tumor   . Kidney stones     Past Surgical History  Procedure Date  . Abdominal hysterectomy     partial  . Kidney stone removal   . Appendectomy     No family history on file.  History  Substance Use Topics  . Smoking status: Current Everyday Smoker -- 1.0 packs/day  . Smokeless tobacco: Not on file  . Alcohol Use: No    OB History    Grav Para Term Preterm Abortions TAB SAB Ect Mult Living                  Review of Systems  All other systems reviewed and are negative.    Allergies  Lortab; Toradol; and Tramadol  Home Medications   Current Outpatient Rx  Name Route Sig Dispense Refill  . CLONAZEPAM 1 MG PO TABS Oral Take 1 mg by mouth 3 (three) times daily as needed. For anxiety    . IBUPROFEN 200 MG PO TABS Oral Take 400 mg by mouth every 6 (six) hours as needed. For foot pain     . SERTRALINE HCL 50 MG PO TABS Oral Take 25 mg by mouth 2 (two) times daily.      . TRAZODONE HCL 100 MG PO TABS Oral Take 100 mg by mouth at bedtime.        BP 157/94  Pulse 84  Temp(Src) 99 F (37.2 C) (Oral)  Resp 18  Wt 250 lb (113.399 kg)  SpO2 98%  Physical Exam  Constitutional: She appears well-developed and  well-nourished.  HENT:  Head: Normocephalic and atraumatic.  Cardiovascular: Normal rate and regular rhythm.   Pulmonary/Chest: Effort normal and breath sounds normal.  Musculoskeletal:       Patient has a right great nail that is about to fall off.  There is no swelling or redness to the toe.  The nail is discolored and appears to have a fungal infection.  Skin: Skin is warm and dry.    ED Course  NAIL REMOVAL Date/Time: 01/08/2011 5:27 PM Performed by: Carlyle Dolly Authorized by: Carlyle Dolly Consent: Verbal consent obtained. Risks and benefits: risks, benefits and alternatives were discussed Consent given by: patient Patient understanding: patient states understanding of the procedure being performed Patient consent: the patient's understanding of the procedure matches consent given Procedure consent: procedure consent matches procedure scheduled Relevant documents: relevant documents present and verified Patient identity confirmed: verbally with patient Time out: Immediately prior to procedure a "time out" was called to verify the correct patient, procedure, equipment, support staff and site/side marked as required. Location: right foot Location details: right big toe Anesthesia: digital block Local anesthetic: lidocaine 2% without epinephrine  Anesthetic total: 6 ml Patient sedated: no Preparation: skin prepped with ChloraPrep Amount removed: complete Dressing: 4x4, antibiotic ointment, petrolatum-impregnated gauze and gauze roll Patient tolerance: Patient tolerated the procedure well with no immediate complications.   (including critical care time)  Labs Reviewed - No data to display No results found.   No diagnosis found.    MDM  Patient has, what appears to be a fungal infection of her great toenail and nail is falling off and it was removed by me and have her follow up with her regular doctor.        Carlyle Dolly, [place] 01/08/11  1729

## 2011-02-13 ENCOUNTER — Encounter (HOSPITAL_COMMUNITY): Payer: Self-pay | Admitting: *Deleted

## 2011-02-13 ENCOUNTER — Emergency Department (HOSPITAL_COMMUNITY)
Admission: EM | Admit: 2011-02-13 | Discharge: 2011-02-14 | Disposition: A | Discharge status: Home / Self Care | Payer: Medicaid Other | Attending: Emergency Medicine | Admitting: Emergency Medicine

## 2011-02-13 DIAGNOSIS — R102 Pelvic and perineal pain: Secondary | ICD-10-CM

## 2011-02-13 DIAGNOSIS — R197 Diarrhea, unspecified: Secondary | ICD-10-CM | POA: Insufficient documentation

## 2011-02-13 DIAGNOSIS — R109 Unspecified abdominal pain: Secondary | ICD-10-CM | POA: Insufficient documentation

## 2011-02-13 DIAGNOSIS — G40909 Epilepsy, unspecified, not intractable, without status epilepticus: Secondary | ICD-10-CM | POA: Insufficient documentation

## 2011-02-13 DIAGNOSIS — N949 Unspecified condition associated with female genital organs and menstrual cycle: Secondary | ICD-10-CM | POA: Insufficient documentation

## 2011-02-13 DIAGNOSIS — R112 Nausea with vomiting, unspecified: Secondary | ICD-10-CM | POA: Insufficient documentation

## 2011-02-13 NOTE — ED Notes (Signed)
Pt in c/o n/v and bilateral flank pain

## 2011-02-14 ENCOUNTER — Emergency Department (HOSPITAL_COMMUNITY): Payer: Medicaid Other

## 2011-02-14 LAB — COMPREHENSIVE METABOLIC PANEL
BUN: 12 mg/dL (ref 6–23)
CO2: 26 mEq/L (ref 19–32)
Calcium: 10.1 mg/dL (ref 8.4–10.5)
Chloride: 106 mEq/L (ref 96–112)
Creatinine, Ser: 1.02 mg/dL (ref 0.50–1.10)
GFR calc Af Amer: 87 mL/min — ABNORMAL LOW (ref >90)
GFR calc non Af Amer: 75 mL/min — ABNORMAL LOW (ref >90)
Glucose, Bld: 99 mg/dL (ref 70–99)
Total Bilirubin: 0.3 mg/dL (ref 0.3–1.2)

## 2011-02-14 LAB — URINALYSIS, ROUTINE W REFLEX MICROSCOPIC
Ketones, ur: NEGATIVE mg/dL
Nitrite: NEGATIVE
Protein, ur: NEGATIVE mg/dL

## 2011-02-14 LAB — CBC
HCT: 37.5 % (ref 36.0–46.0)
Hemoglobin: 12.1 g/dL (ref 12.0–15.0)
MCV: 93.8 fL (ref 78.0–100.0)
RBC: 4 MIL/uL (ref 3.87–5.11)
RDW: 13.1 % (ref 11.5–15.5)
WBC: 9.6 10*3/uL (ref 4.0–10.5)

## 2011-02-14 LAB — DIFFERENTIAL
Eosinophils Relative: 1 % (ref 0–5)
Lymphocytes Relative: 45 % (ref 12–46)
Lymphs Abs: 4.3 10*3/uL — ABNORMAL HIGH (ref 0.7–4.0)
Monocytes Absolute: 0.5 10*3/uL (ref 0.1–1.0)
Monocytes Relative: 5 % (ref 3–12)

## 2011-02-14 LAB — URINE MICROSCOPIC-ADD ON

## 2011-02-14 LAB — PREGNANCY, URINE: Preg Test, Ur: NEGATIVE

## 2011-02-14 LAB — WET PREP, GENITAL
Trich, Wet Prep: NONE SEEN
WBC, Wet Prep HPF POC: NONE SEEN
Yeast Wet Prep HPF POC: NONE SEEN

## 2011-02-14 MED ORDER — MORPHINE SULFATE 4 MG/ML IJ SOLN
4.0000 mg | Freq: Once | INTRAMUSCULAR | Status: AC
  Administered 2011-02-14: 4 mg via INTRAVENOUS
  Filled 2011-02-14: qty 1

## 2011-02-14 MED ORDER — SODIUM CHLORIDE 0.9 % IV BOLUS (SEPSIS)
1000.0000 mL | Freq: Once | INTRAVENOUS | Status: AC
  Administered 2011-02-14: 1000 mL via INTRAVENOUS

## 2011-02-14 MED ORDER — ONDANSETRON 8 MG PO TBDP: 8.0000 mg | ORAL_TABLET | Freq: Three times a day (TID) | ORAL | Status: AC | PRN

## 2011-02-14 MED ORDER — ONDANSETRON HCL 4 MG/2ML IJ SOLN
4.0000 mg | Freq: Once | INTRAMUSCULAR | Status: AC
  Administered 2011-02-14: 4 mg via INTRAVENOUS
  Filled 2011-02-14: qty 2

## 2011-02-14 MED ORDER — OXYCODONE-ACETAMINOPHEN 5-325 MG PO TABS: 2.0000 | ORAL_TABLET | ORAL | Status: DC | PRN

## 2011-02-14 MED ORDER — MORPHINE SULFATE 4 MG/ML IJ SOLN
INTRAMUSCULAR | Status: AC
  Filled 2011-02-14: qty 1

## 2011-02-14 MED ORDER — MORPHINE SULFATE 4 MG/ML IJ SOLN
4.0000 mg | Freq: Once | INTRAMUSCULAR | Status: AC
  Administered 2011-02-14: 4 mg via INTRAVENOUS

## 2011-02-14 MED ORDER — ALBUTEROL SULFATE HFA 108 (90 BASE) MCG/ACT IN AERS: 2.0000 | INHALATION_SPRAY | Freq: Once | RESPIRATORY_TRACT | Status: DC

## 2011-02-14 NOTE — ED Notes (Signed)
Pt here with c/o abd pain and left lower back pain. Pt says she has a hx of stones. Pt describes her pain as sharp achy and mid abdomen. Pt has a hx of ovarian cancer as well and has been having these symptoms for 2-3 days now and is progress getting worse. Rates pain 10/10. S/s assoc with nausea/vomiting

## 2011-02-14 NOTE — ED Provider Notes (Signed)
History     CSN: [id number] Arrival date & time: 02/13/2011  9:47 PM   First MD Initiated Contact with Patient 02/14/11 0054      Chief Complaint  Patient presents with  . Flank Pain    (Consider location/radiation/quality/duration/timing/severity/associated sxs/prior treatment) HPI History provided by pt.   Pt c/o pain in her uterus and left kidney x 4 days.  Pain started mid-line lower abd and has spread to upper abd w/ radiation to left flank.  Associated w/ N/V/D.  Unable to tolerate pos.  No GU symptoms.  No relief w/ ibuprofen. Pt believes that she has had a bilateral oophorectomy secondary to ovarian cancer diagnosed at age 9.  Does not get a period.  Called her Gynecologist and was told that she could not be seen until January.    Past Medical History  Diagnosis Date  . 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 Brain tumor   . Kidney stones     Past Surgical History  Procedure Date  . Abdominal hysterectomy     partial  . Kidney stone removal   . Appendectomy     History reviewed. No pertinent family history.  History  Substance Use Topics  . Smoking status: Current Everyday Smoker -- 1.0 packs/day  . Smokeless tobacco: Not on file  . Alcohol Use: No    OB History    Grav Para Term Preterm Abortions TAB SAB Ect Mult Living                  Review of Systems  All other systems reviewed and are negative.    Allergies  Lortab; Toradol; and Tramadol  Home Medications   Current Outpatient Rx  Name Route Sig Dispense Refill  . CLONAZEPAM 1 MG PO TABS Oral Take 1 mg by mouth 3 (three) times daily as needed. For anxiety    . HALOPERIDOL DECANOATE 100 MG/ML IM SOLN Intramuscular Inject 150 mg into the muscle every 28 (twenty-eight) days.      . IBUPROFEN 200 MG PO TABS Oral Take 400 mg by mouth every 6 (six) hours as needed. For foot pain     . OXYCODONE-ACETAMINOPHEN 7.5-325 MG PO TABS Oral Take 1 tablet by mouth every 4 (four) hours as needed.  Pain     . SERTRALINE HCL 50 MG PO TABS Oral Take 25 mg by mouth 2 (two) times daily.      . TRAZODONE HCL 100 MG PO TABS Oral Take 100 mg by mouth at bedtime.        BP 140/92  Pulse 90  Temp(Src) 98.9 F (37.2 C) (Oral)  Resp 20  SpO2 99%  Physical Exam  Nursing note and vitals reviewed. Constitutional: She is oriented to person, place, and time. She appears well-developed and well-nourished. No distress.       Appears uncomfortable w/ change of position only  HENT:  Head: Normocephalic and atraumatic.  Mouth/Throat: Oropharynx is clear and moist.  Eyes:       Normal appearance  Neck: Normal range of motion.  Cardiovascular: Normal rate and regular rhythm.   Pulmonary/Chest: Effort normal and breath sounds normal.  Abdominal: Soft. Bowel sounds are normal. She exhibits no distension and no mass. There is no rebound and no guarding.       Obese.  Horizontal surgical scar across pelvis.  Diffuse tenderness, mild across upper abd and mod across lower abd.  L CVA tenderness reported but pt does not appear  uncomfortable.  Genitourinary:       Nml external genitalia.  No vaginal discharge/bleeding.  Cervix closed.  Can not visualize d/t redundant.  Cervical motion tenderness.   Neurological: She is alert and oriented to person, place, and time.  Skin: Skin is warm and dry. No rash noted.  Psychiatric: She has a normal mood and affect. Her behavior is normal.    ED Course  Procedures (including critical care time)  Labs Reviewed  DIFFERENTIAL - Abnormal; Notable for the following:    Lymphs Abs 4.3 (*)    All other components within normal limits  COMPREHENSIVE METABOLIC PANEL - Abnormal; Notable for the following:    GFR calc non Af Amer 75 (*)    GFR calc Af Amer 87 (*)    All other components within normal limits  URINALYSIS, ROUTINE W REFLEX MICROSCOPIC - Abnormal; Notable for the following:    APPearance CLOUDY (*)    Bilirubin Urine SMALL (*)    Leukocytes, UA TRACE  (*)    All other components within normal limits  URINE MICROSCOPIC-ADD ON - Abnormal; Notable for the following:    Squamous Epithelial / LPF FEW (*)    Crystals CA OXALATE CRYSTALS (*)    All other components within normal limits  CBC  LIPASE, BLOOD  PREGNANCY, URINE  WET PREP, GENITAL  GC/CHLAMYDIA PROBE AMP, GENITAL   US Transvaginal Non-ob  02/14/2011  *RADIOLOGY REPORT*  Clinical Data: Pelvic pain, history of bilateral oophorectomy for ovarian cancer.  TRANSABDOMINAL AND TRANSVAGINAL ULTRASOUND OF PELVIS Technique:  Both transabdominal and transvaginal ultrasound examinations of the pelvis were performed. Transabdominal technique was performed for global imaging of the pelvis including uterus, ovaries, adnexal regions, and pelvic cul-de-sac.  Comparison: 11/25/2010 CT   It was necessary to proceed with endovaginal exam following the transabdominal exam to visualize the endometrium and adnexa.  Findings:  Uterus: Normal sonographic appearance, measuring 5.9 x 2.1 x 3.2 cm.  Endometrium: Thin endometrium, measuring 1.4 mm.  A small amount of fluid is noted within the lower uterine segment.  Right ovary:  Surgically absent.  Left ovary: Surgically absent.  Other findings: No free fluid identified.  No adnexal mass.  IMPRESSION: Status post bilateral oophorectomy.  No adnexal mass identified.  Original Report Authenticated By: Waneta Martins, M.D.   US Pelvis Complete  02/14/2011  *RADIOLOGY REPORT*  Clinical Data: Pelvic pain, history of bilateral oophorectomy for ovarian cancer.  TRANSABDOMINAL AND TRANSVAGINAL ULTRASOUND OF PELVIS Technique:  Both transabdominal and transvaginal ultrasound examinations of the pelvis were performed. Transabdominal technique was performed for global imaging of the pelvis including uterus, ovaries, adnexal regions, and pelvic cul-de-sac.  Comparison: 11/25/2010 CT   It was necessary to proceed with endovaginal exam following the transabdominal exam to  visualize the endometrium and adnexa.  Findings:  Uterus: Normal sonographic appearance, measuring 5.9 x 2.1 x 3.2 cm.  Endometrium: Thin endometrium, measuring 1.4 mm.  A small amount of fluid is noted within the lower uterine segment.  Right ovary:  Surgically absent.  Left ovary: Surgically absent.  Other findings: No free fluid identified.  No adnexal mass.  IMPRESSION: Status post bilateral oophorectomy.  No adnexal mass identified.  Original Report Authenticated By: Waneta Martins, M.D.     1. Pelvic pain in female       MDM  Pt presents w/ mid-line lower abd pain x 4d + N/V.  Has h/o bilateral oophorectomy d/t ovarian cancer and kidney stone.   On exam,  pt does not appear uncomfortable unless changing position, diffuse lower abd ttp, mild if any L CVA ttp and cervical motion tenderness.   Labs unremarkable; no UTI, no hematuria, nml wet prep.  Kidney stone unlikely based on characteristics of pain, exam, U/A and pt report that this pain different than kidney stones she has had in past.  Transvaginal US pending.  Pt has received morphine for pain w/ relief.   [place] nml.  Results dicussed w/ pt.  She has a gynecologist to f/u with.  D/c'd home w/ pain/nausea medication.  Return precautions discussed.         Arie Sabina Eunice, [place] 02/14/11 [phone]

## 2011-02-14 NOTE — ED Provider Notes (Signed)
Medical screening examination/treatment/procedure(s) were performed by non-physician practitioner and as supervising physician I was immediately available for consultation/collaboration.   Gwyneth Sprout, MD 02/14/11 2017

## 2011-02-14 NOTE — ED Notes (Signed)
Patient transported to Ultrasound 

## 2011-02-14 NOTE — ED Notes (Signed)
Patient stable upon discharge.  

## 2011-02-14 NOTE — ED Notes (Signed)
Patient is resting comfortably. 

## 2011-02-21 ENCOUNTER — Encounter (HOSPITAL_COMMUNITY): Payer: Self-pay | Admitting: Emergency Medicine

## 2011-02-21 ENCOUNTER — Inpatient Hospital Stay (HOSPITAL_COMMUNITY)
Admission: AD | Admit: 2011-02-21 | Discharge: 2011-02-22 | DRG: 885 | Disposition: A | Discharge status: Home / Self Care | Payer: Medicaid Other | Attending: Psychiatry | Admitting: Psychiatry

## 2011-02-21 ENCOUNTER — Emergency Department (EMERGENCY_DEPARTMENT_HOSPITAL)
Admission: EM | Admit: 2011-02-21 | Discharge: 2011-02-21 | Disposition: A | Discharge status: Other Acute Inpatient Hospital | Payer: Medicaid Other | Source: Home / Self Care | Attending: Emergency Medicine | Admitting: Emergency Medicine

## 2011-02-21 ENCOUNTER — Encounter (HOSPITAL_COMMUNITY): Payer: Self-pay | Admitting: Behavioral Health

## 2011-02-21 DIAGNOSIS — F251 Schizoaffective disorder, depressive type: Secondary | ICD-10-CM | POA: Diagnosis present

## 2011-02-21 DIAGNOSIS — F29 Unspecified psychosis not due to a substance or known physiological condition: Secondary | ICD-10-CM

## 2011-02-21 DIAGNOSIS — F411 Generalized anxiety disorder: Secondary | ICD-10-CM

## 2011-02-21 DIAGNOSIS — F209 Schizophrenia, unspecified: Secondary | ICD-10-CM

## 2011-02-21 DIAGNOSIS — F17200 Nicotine dependence, unspecified, uncomplicated: Secondary | ICD-10-CM

## 2011-02-21 DIAGNOSIS — G40909 Epilepsy, unspecified, not intractable, without status epilepticus: Secondary | ICD-10-CM

## 2011-02-21 DIAGNOSIS — F329 Major depressive disorder, single episode, unspecified: Secondary | ICD-10-CM

## 2011-02-21 DIAGNOSIS — Z859 Personal history of malignant neoplasm, unspecified: Secondary | ICD-10-CM

## 2011-02-21 DIAGNOSIS — I10 Essential (primary) hypertension: Secondary | ICD-10-CM

## 2011-02-21 DIAGNOSIS — F3289 Other specified depressive episodes: Secondary | ICD-10-CM

## 2011-02-21 DIAGNOSIS — B372 Candidiasis of skin and nail: Secondary | ICD-10-CM

## 2011-02-21 DIAGNOSIS — B354 Tinea corporis: Secondary | ICD-10-CM

## 2011-02-21 DIAGNOSIS — F259 Schizoaffective disorder, unspecified: Principal | ICD-10-CM

## 2011-02-21 HISTORY — DX: Major depressive disorder, single episode, unspecified: F32.9

## 2011-02-21 HISTORY — DX: Malignant (primary) neoplasm, unspecified: C80.1

## 2011-02-21 HISTORY — DX: Depression, unspecified: F32.A

## 2011-02-21 HISTORY — DX: Unspecified convulsions: R56.9

## 2011-02-21 HISTORY — DX: Essential (primary) hypertension: I10

## 2011-02-21 HISTORY — DX: Anxiety disorder, unspecified: F41.9

## 2011-02-21 LAB — RAPID URINE DRUG SCREEN, HOSP PERFORMED
Barbiturates: NOT DETECTED
Cocaine: POSITIVE — AB
Opiates: NOT DETECTED

## 2011-02-21 LAB — DIFFERENTIAL
Basophils Absolute: 0 10*3/uL (ref 0.0–0.1)
Lymphocytes Relative: 36 % (ref 12–46)
Lymphs Abs: 3.2 10*3/uL (ref 0.7–4.0)
Neutro Abs: 5.2 10*3/uL (ref 1.7–7.7)

## 2011-02-21 LAB — BASIC METABOLIC PANEL
CO2: 23 mEq/L (ref 19–32)
Chloride: 103 mEq/L (ref 96–112)
Potassium: 4 mEq/L (ref 3.5–5.1)
Sodium: 137 mEq/L (ref 135–145)

## 2011-02-21 LAB — URINALYSIS, ROUTINE W REFLEX MICROSCOPIC
Bilirubin Urine: NEGATIVE
Nitrite: NEGATIVE
Specific Gravity, Urine: 1.024 (ref 1.005–1.030)
Urobilinogen, UA: 1 mg/dL (ref 0.0–1.0)

## 2011-02-21 LAB — CBC
Platelets: 357 10*3/uL (ref 150–400)
RBC: 4.43 MIL/uL (ref 3.87–5.11)
RDW: 13.1 % (ref 11.5–15.5)
WBC: 9 10*3/uL (ref 4.0–10.5)

## 2011-02-21 LAB — URINE MICROSCOPIC-ADD ON

## 2011-02-21 LAB — ETHANOL: Alcohol, Ethyl (B): <11 mg/dL (ref 0–11)

## 2011-02-21 LAB — PREGNANCY, URINE: Preg Test, Ur: NEGATIVE

## 2011-02-21 MED ORDER — TRAZODONE HCL 100 MG PO TABS
100.0000 mg | ORAL_TABLET | Freq: Every day | ORAL | Status: DC
  Filled 2011-02-21 (×2): qty 1

## 2011-02-21 MED ORDER — HALOPERIDOL 5 MG PO TABS
5.0000 mg | ORAL_TABLET | Freq: Two times a day (BID) | ORAL | Status: DC
  Administered 2011-02-21 – 2011-02-22 (×2): 5 mg via ORAL
  Filled 2011-02-21 (×4): qty 1

## 2011-02-21 MED ORDER — SERTRALINE HCL 25 MG PO TABS
25.0000 mg | ORAL_TABLET | Freq: Two times a day (BID) | ORAL | Status: DC
  Administered 2011-02-21 – 2011-02-22 (×2): 25 mg via ORAL
  Filled 2011-02-21 (×4): qty 1

## 2011-02-21 MED ORDER — CLONAZEPAM 1 MG PO TABS
1.0000 mg | ORAL_TABLET | Freq: Three times a day (TID) | ORAL | Status: DC | PRN
  Administered 2011-02-21: 1 mg via ORAL
  Filled 2011-02-21: qty 1

## 2011-02-21 MED ORDER — SERTRALINE HCL 50 MG PO TABS
50.0000 mg | ORAL_TABLET | Freq: Every day | ORAL | Status: DC
  Administered 2011-02-21: 50 mg via ORAL
  Filled 2011-02-21 (×2): qty 1

## 2011-02-21 MED ORDER — SERTRALINE HCL 50 MG PO TABS: 25.0000 mg | ORAL_TABLET | Freq: Two times a day (BID) | ORAL | Status: DC

## 2011-02-21 MED ORDER — TRAZODONE HCL 100 MG PO TABS: 100.0000 mg | ORAL_TABLET | Freq: Every day | ORAL | Status: DC

## 2011-02-21 MED ORDER — ONDANSETRON HCL 4 MG PO TABS: 4.0000 mg | ORAL_TABLET | Freq: Three times a day (TID) | ORAL | Status: DC | PRN

## 2011-02-21 MED ORDER — LORAZEPAM 1 MG PO TABS
1.0000 mg | ORAL_TABLET | Freq: Once | ORAL | Status: AC
  Administered 2011-02-21: 1 mg via ORAL
  Filled 2011-02-21: qty 1

## 2011-02-21 MED ORDER — MAGNESIUM HYDROXIDE 400 MG/5ML PO SUSP: 30.0000 mL | Freq: Every day | ORAL | Status: DC | PRN

## 2011-02-21 MED ORDER — DIPHENHYDRAMINE HCL 50 MG PO CAPS
50.0000 mg | ORAL_CAPSULE | Freq: Every day | ORAL | Status: DC
  Filled 2011-02-21 (×2): qty 1

## 2011-02-21 MED ORDER — CLONAZEPAM 1 MG PO TABS
1.0000 mg | ORAL_TABLET | Freq: Three times a day (TID) | ORAL | Status: DC | PRN
  Administered 2011-02-22: 1 mg via ORAL
  Filled 2011-02-21: qty 1

## 2011-02-21 MED ORDER — HALOPERIDOL DECANOATE 100 MG/ML IM SOLN: 150.0000 mg | INTRAMUSCULAR | Status: DC

## 2011-02-21 MED ORDER — HALOPERIDOL DECANOATE 100 MG/ML IM SOLN
150.0000 mg | INTRAMUSCULAR | Status: DC
  Administered 2011-02-21: 150 mg via INTRAMUSCULAR
  Filled 2011-02-21: qty 1.5

## 2011-02-21 MED ORDER — OXYCODONE-ACETAMINOPHEN 5-325 MG PO TABS: 2.0000 | ORAL_TABLET | ORAL | Status: DC | PRN

## 2011-02-21 NOTE — ED Notes (Signed)
Pt arrives on unit, alert, calm, cooperative. RN reports that pt has been off meds and has begun hearing voices laughing at her and seeing 'little devil trolls'. Given Ativan 1mg  per new order.

## 2011-02-21 NOTE — ED Notes (Addendum)
Pt out of bathroom to window upset/agitated, tearful, requesting to speak with MD about him telling her that she will only be at the [facility] for 2-3 days and the ACT team staff she will be over there for a while. Pt states she does not want to go over there and be locked up for the weekend. Feels MD is lying to her about length of stay at [facility]. RN able to speak with pt and calm her.

## 2011-02-21 NOTE — ED Notes (Addendum)
Assumed care of pt, pt in room sleeping

## 2011-02-21 NOTE — ED Notes (Signed)
MD/FNP at bedside.

## 2011-02-21 NOTE — ED Notes (Signed)
Pt up OOB to bathroom.

## 2011-02-21 NOTE — ED Notes (Signed)
Pt presented to the ER with c/o visual hallucinations, states she started to see "little demon trolls with horns", pt further states that she did not have her regular Rx medications since October, pt denies any SI /Hi ideations at this time, states " i need to stay over night", pt also asking " do you thing they followed me in here". Pt reassured that she is safe in the ER.
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
## 2011-02-21 NOTE — ED Notes (Signed)
Pt back to waiting room after initial triage assessment. Pt recognized another pt in waiting room, proceeded to walk over to pt and engage in a full conversation without issue.

## 2011-02-21 NOTE — Consult Note (Signed)
Patient Identification:  [name] Date of Evaluation:  02/21/2011   History of Present Illness:  The patient seen in assessment reviewed.  25 year old female who is off medication for the last 2 months reported hearing voices and also abusing cocaine and marijuana. Patient is logical and goal-directed during interview no agitation reported by the staff. Patient denies having suicidal or homicidal ideations. Patient is willing to be started back on the medications.   PAST PSYCHIATRIC HISTORY:  Followed as an outpatient at [facility] for monthly Haldol Decanoate injections.  Was admitted at behavioral health to 2 times with the overdose.  She reports that her mother was   killed in front of her at age 9 and in the past she has endorsed having   some flashbacks and thinking about this a lot.      SOCIAL HISTORY:  Single African American female.  She is living in her   aunt's house.  She receives a disability check monthly and is on   disability for her mental illness.  She has no children.  She is   unemployed, not attending any day program.  Says that she mostly stays   at home all day, spends her time listening to music         ALCOHOL AND DRUG HISTORY:  Urine drug screen was positive for cocaine   and she is not providing any history.          DRUG ALLERGIES:  NONE.  She had a questionable dystonic reaction to   Zyprexa oral dose in the emergency room.  Symptoms relieved by Benadryl.     Past Medical History:     Past Medical History  Diagnosis Date  . 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 Brain tumor   . Kidney stones   . Schizoaffective disorder        Past Surgical History  Procedure Date  . Abdominal hysterectomy     partial  . Kidney stone removal   . Appendectomy     There were no vitals filed for this visit.  Lab Results:   BMET    Component Value Date/Time   NA 137 02/21/2011 0427   K 4.0 02/21/2011 0427   CL  103 02/21/2011 0427   CO2 23 02/21/2011 0427   GLUCOSE 100* 02/21/2011 0427   BUN 14 02/21/2011 0427   CREATININE 0.98 02/21/2011 0427   CALCIUM 10.4 02/21/2011 0427   GFRNONAA 79* 02/21/2011 0427   GFRAA >90 02/21/2011 0427    Allergies:  Allergies  Allergen Reactions  . Lortab Hives  . Toradol Hives  . Tramadol Hives    Current Medications:  Prior to Admission medications   Medication Sig Start Date End Date Taking? Authorizing Provider  clonazePAM (KLONOPIN) 1 MG tablet Take 1 mg by mouth 3 (three) times daily as needed. For anxiety    Historical Provider, MD  haloperidol decanoate (HALDOL DECANOATE) 100 MG/ML injection Inject 150 mg into the muscle every 28 (twenty-eight) days.      Historical Provider, MD  ibuprofen (ADVIL,MOTRIN) 200 MG tablet Take 400 mg by mouth every 6 (six) hours as needed. For foot pain     Historical Provider, MD  ondansetron (ZOFRAN ODT) 8 MG disintegrating tablet Take 1 tablet (8 mg total) by mouth every 8 (eight) hours as needed for nausea. 02/14/11 02/21/11  Arie Sabina Schinlever, PA  oxyCODONE-acetaminophen (PERCOCET) 5-325 MG per tablet Take 2 tablets by  mouth every 4 (four) hours as needed for pain. 02/14/11 02/24/11  Arie Sabina Schinlever, PA  oxyCODONE-acetaminophen (PERCOCET) 7.5-325 MG per tablet Take 1 tablet by mouth every 4 (four) hours as needed. Pain     Historical Provider, MD  sertraline (ZOLOFT) 50 MG tablet Take 25 mg by mouth 2 (two) times daily.      Historical Provider, MD  traZODone (DESYREL) 100 MG tablet Take 100 mg by mouth at bedtime.      Historical Provider, MD    Social History:    reports that she has been smoking.  She does not have any smokeless tobacco history on file. She reports that she does not drink alcohol or use illicit drugs.   Family History:    History reviewed. No pertinent family history.   DIAGNOSIS:   AXIS I  schizophrenia paranoid type as per history, marijuana and cocaine dependence    AXIS II   Deffered  AXIS III See medical notes.   AXIS IV  noncompliance with medications  AXIS V 40     Recommendations:  patient need inpatient stabilization  Patient started on Haldol Depakote Zoloft and trazodone.     Eulogio Ditch, MD

## 2011-02-21 NOTE — ED Provider Notes (Signed)
History     CSN: [id number]  Arrival date & time 02/21/11  0055   First MD Initiated Contact with Patient 02/21/11 [phone]      Chief Complaint  Patient presents with  . Hallucinations    visual  . Medical Clearance    (Consider location/radiation/quality/duration/timing/severity/associated sxs/prior treatment) Patient is a 25 y.o. female presenting with mental health disorder. The history is provided by the patient.  Mental Health Problem The primary symptoms include hallucinations. The current episode started yesterday.  The degree of incapacity that she is experiencing as a consequence of her illness is mild. Additional symptoms of the illness include insomnia. She does not admit to suicidal ideas. She does not have a plan to commit suicide. She does not contemplate harming herself. She has not already injured self. She does not contemplate injuring another person. She has not already  injured another person. Risk factors that are present for mental illness include a history of mental illness.  Reports she needs to be admitted to have her medications adjusted. States that she has been off her medications since October. States she periodically goes off her medications to see if she can function without them. Yesterday he states she began to have hallucinations, states she hears demons, and can sometimes see them running around her. Patient denies SI/HI and drug or alcohol abuse.   Past Medical History  Diagnosis Date  . 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 Brain tumor   . Kidney stones   . Schizoaffective disorder     Past Surgical History  Procedure Date  . Abdominal hysterectomy     partial  . Kidney stone removal   . Appendectomy     History reviewed. No pertinent family history.  History  Substance Use Topics  . Smoking status: Current Everyday Smoker -- 1.0 packs/day  . Smokeless tobacco: Not on file  . Alcohol Use: No    OB History    Grav Para Term  Preterm Abortions TAB SAB Ect Mult Living                  Review of Systems  Constitutional: Negative.   HENT: Negative.   Eyes: Negative.   Respiratory: Negative.   Cardiovascular: Negative.   Gastrointestinal: Negative.   Genitourinary: Negative.   Musculoskeletal: Negative.   Skin: Negative.   Neurological: Negative.   Hematological: Negative.   Psychiatric/Behavioral: Positive for hallucinations. The patient has insomnia.     Allergies  Lortab; Toradol; and Tramadol  Home Medications   Current Outpatient Rx  Name Route Sig Dispense Refill  . CLONAZEPAM 1 MG PO TABS Oral Take 1 mg by mouth 3 (three) times daily as needed. For anxiety    . HALOPERIDOL DECANOATE 100 MG/ML IM SOLN Intramuscular Inject 150 mg into the muscle every 28 (twenty-eight) days.      . IBUPROFEN 200 MG PO TABS Oral Take 400 mg by mouth every 6 (six) hours as needed. For foot pain     . ONDANSETRON 8 MG PO TBDP Oral Take 1 tablet (8 mg total) by mouth every 8 (eight) hours as needed for nausea. 20 tablet 0  . OXYCODONE-ACETAMINOPHEN 5-325 MG PO TABS Oral Take 2 tablets by mouth every 4 (four) hours as needed for pain. 15 tablet 0  . OXYCODONE-ACETAMINOPHEN 7.5-325 MG PO TABS Oral Take 1 tablet by mouth every 4 (four) hours as needed. Pain     . SERTRALINE HCL 50 MG PO TABS Oral  Take 25 mg by mouth 2 (two) times daily.      . TRAZODONE HCL 100 MG PO TABS Oral Take 100 mg by mouth at bedtime.        There were no vitals taken for this visit.  Physical Exam  Constitutional: She is oriented to person, place, and time. She appears well-developed and well-nourished.  HENT:  Head: Normocephalic and atraumatic.  Eyes: Conjunctivae are normal.  Neck: Neck supple.  Cardiovascular: Normal rate and regular rhythm.   Pulmonary/Chest: Effort normal and breath sounds normal.  Abdominal: Soft. Bowel sounds are normal.  Musculoskeletal: Normal range of motion.  Neurological: She is alert and oriented to  person, place, and time.  Skin: Skin is warm and dry. Rash noted. Rash is papular. No erythema.  Psychiatric: She has a normal mood and affect.    ED Course  Procedures  5:50 AM  I have spoken w/ "Aurther Loft" w/ Act Team who is aware pt requesting to be evaluated for auditory (? and visual) hallucinations.    Labs Reviewed  CBC  DIFFERENTIAL  BASIC METABOLIC PANEL  ETHANOL  PREGNANCY, URINE  URINALYSIS, ROUTINE W REFLEX MICROSCOPIC  URINE RAPID DRUG SCREEN (HOSP PERFORMED)   No results found.   No diagnosis found.    MDM  Medical clearance and eval by Act team pending.        Leanne Chang, NP 02/21/11 [phone]

## 2011-02-21 NOTE — ED Provider Notes (Addendum)
Pt resting quietly on bed. Easily aroused, calm/alert. No c/o. Dr Rogers Blocker to round on pt this morning, disposition pending, anticipate possible [facility].   Pt accepted to [facility], bed ready.   Suzi Roots, MD 02/21/11 1610  Suzi Roots, MD 02/21/11 [phone]

## 2011-02-21 NOTE — BH Assessment (Addendum)
Assessment Note   [name] is a 25 y.o. female who presents to [facility] with AH.  Pt reports being off medications(Haldol Inj, Klonopin,Trazodone, Zoloft)) since 11/2010(last inj recv'd). Pt. recv's Haldol Inj q 4wks.  Pt. Says--" I was trying to see how long I could last w/o it".  Pt says she has been hearing voices since 02/20/11, voices are not speaking but laughing.  Pt denies SA use but UDS is positive for Cocaine and THC.  Pt does have court date on 02/28/10 for broken window, will be attending mental health court.  Denies SI/HI.  Axis I: Psychotic Disorder NOS Axis II: Deferred Axis III:  Past Medical History  Diagnosis Date  . 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 Brain tumor   . Kidney stones   . Schizoaffective disorder   . Anxiety   . Seizures   . Hypertension   . Depression    Axis IV: other psychosocial or environmental problems, problems related to social environment and problems with primary support group Axis V: 31-40 impairment in reality testing  Past Medical History:  Past Medical History  Diagnosis Date  . 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 Brain tumor   . Kidney stones   . Schizoaffective disorder   . Anxiety   . Seizures   . Hypertension   . Depression     Past Surgical History  Procedure Date  . Abdominal hysterectomy     partial  . Kidney stone removal   . Appendectomy     Family History: History reviewed. No pertinent family history.  Social History:  reports that she has been smoking.  She does not have any smokeless tobacco history on file. She reports that she uses illicit drugs (Marijuana and Cocaine). She reports that she does not drink alcohol.  Additional Social History:  Alcohol / Drug Use Pain Medications: None  Prescriptions: None  Over the Counter: None  History of alcohol / drug use?: Yes Substance #1 Name of Substance 1: Cocaine  1 - Age of First Use: Unk  1 - Amount (size/oz): Unk  1 -  Frequency: Unk  1 - Duration: Unk  1 - Last Use / Amount: 02/21/11 Substance #2 Name of Substance 2: THC 2 - Age of First Use: Unk  2 - Amount (size/oz): Unk  2 - Frequency: Unk  2 - Duration: Unk  2 - Last Use / Amount: 02/21/11 Allergies:  Allergies  Allergen Reactions  . Lortab Hives  . Toradol Hives  . Tramadol Hives    Home Medications:  Medications Prior to Admission  Medication Dose Route Frequency Provider Last Rate Last Dose  . LORazepam (ATIVAN) tablet 1 mg  1 mg Oral Once Leanne Chang, NP   1 mg at 02/21/11 2956  . ondansetron (ZOFRAN) tablet 4 mg  4 mg Oral Q8H PRN Leanne Chang, NP       Medications Prior to Admission  Medication Sig Dispense Refill  . clonazePAM (KLONOPIN) 1 MG tablet Take 1 mg by mouth 3 (three) times daily as needed. For anxiety      . haloperidol decanoate (HALDOL DECANOATE) 100 MG/ML injection Inject 150 mg into the muscle every 28 (twenty-eight) days.        Marland Kitchen ibuprofen (ADVIL,MOTRIN) 200 MG tablet Take 400 mg by mouth every 6 (six) hours as needed. For foot pain       . ondansetron (ZOFRAN ODT) 8 MG disintegrating tablet  Take 1 tablet (8 mg total) by mouth every 8 (eight) hours as needed for nausea.  20 tablet  0  . oxyCODONE-acetaminophen (PERCOCET) 5-325 MG per tablet Take 2 tablets by mouth every 4 (four) hours as needed for pain.  15 tablet  0  . oxyCODONE-acetaminophen (PERCOCET) 7.5-325 MG per tablet Take 1 tablet by mouth every 4 (four) hours as needed. Pain       . sertraline (ZOLOFT) 50 MG tablet Take 25 mg by mouth 2 (two) times daily.        . traZODone (DESYREL) 100 MG tablet Take 100 mg by mouth at bedtime.          OB/GYN Status:  No LMP recorded. Patient has had a hysterectomy.  General Assessment Data Location of Assessment: [facility] ED Living Arrangements: Relatives (Lives with sister ) Can pt return to current living arrangement?: Yes Admission Status: Voluntary Is patient capable of signing voluntary admission?:  Yes Transfer from: Acute Hospital Referral Source: MD  Education Status Is patient currently in school?: No Current Grade: None  Highest grade of school patient has completed: Unk  Name of school: None  Contact person: None   Risk to self Suicidal Ideation: No Suicidal Intent: No Is patient at risk for suicide?: No Suicidal Plan?: No Access to Means: No What has been your use of drugs/alcohol within the last 12 months?: Pt. denies  Previous Attempts/Gestures: Yes How many times?: 3  Other Self Harm Risks: None  Triggers for Past Attempts: Other (Comment) (Off meds; Depression ) Intentional Self Injurious Behavior: None Family Suicide History: Yes (Cousin commited SI ) Recent stressful life event(s): Other (Comment) (Off Meds since 11/2010) Persecutory voices/beliefs?: No Depression: Yes Depression Symptoms: Loss of interest in usual pleasures Substance abuse history and/or treatment for substance abuse?: Yes Suicide prevention information given to non-admitted patients: Not applicable  Risk to Others Homicidal Ideation: No Thoughts of Harm to Others: No Current Homicidal Intent: No Current Homicidal Plan: No Access to Homicidal Means: No Identified Victim: None  History of harm to others?: No Assessment of Violence: None Noted Violent Behavior Description: None  Does patient have access to weapons?: No Criminal Charges Pending?: Yes Describe Pending Criminal Charges: Broken Window (Pt will be involved with Mental Health Court ) Does patient have a court date: Yes Court Date: 03/01/11  Psychosis Hallucinations: Auditory ("Voices are saying anything bad, they're laughing") Delusions: None noted  Mental Status Report Appear/Hygiene: Disheveled Eye Contact: Good Motor Activity: Unremarkable Speech: Logical/coherent Level of Consciousness: Alert Mood: Sad;Depressed Affect: Sad Anxiety Level: None Thought Processes: Coherent;Relevant Judgement:  Unimpaired Orientation: Person;Place;Time;Situation Obsessive Compulsive Thoughts/Behaviors: None  Cognitive Functioning Concentration: Normal Memory: Recent Intact;Remote Intact IQ: Average Insight: Fair Impulse Control: Fair Appetite: Good Weight Loss: 0  Weight Gain: 0  Sleep: No Change Total Hours of Sleep: 8  Vegetative Symptoms: None  Prior Inpatient Therapy Prior Inpatient Therapy: Yes Prior Therapy Dates: 2009-2011 Prior Therapy Facilty/Provider(s): [facility] Reason for Treatment: Psychosis   Prior Outpatient Therapy Prior Outpatient Therapy: No Prior Therapy Dates: None  Prior Therapy Facilty/Provider(s): None  Reason for Treatment: None   ADL Screening (condition at time of admission) Patient's cognitive ability adequate to safely complete daily activities?: Yes Patient able to express need for assistance with ADLs?: Yes Independently performs ADLs?: Yes Weakness of Legs: None Weakness of Arms/Hands: None       Abuse/Neglect Assessment (Assessment to be complete while patient is alone) Physical Abuse: Denies Verbal Abuse: Denies Sexual Abuse: Denies Exploitation  of patient/patient's resources: Denies Self-Neglect: Denies Values / Beliefs Cultural Requests During Hospitalization: None Spiritual Requests During Hospitalization: None Consults Spiritual Care Consult Needed: No Social Work Consult Needed: No Merchant navy officer (For Healthcare) Advance Directive: Patient does not have advance directive;Patient would not like information Pre-existing out of facility DNR order (yellow form or pink MOST form): No    Additional Information 1:1 In Past 12 Months?: No CIRT Risk: No Elopement Risk: No Does patient have medical clearance?: Yes     Disposition:  Disposition Disposition of Patient: Referred to [facility]) Patient referred to: Other (Comment) [facility]) Pt accepted at [facility] by Dr. Thane Edu, LCSWA  On Site Evaluation by:   Reviewed with Physician:      Murrell Redden 02/21/2011 7:22 AM

## 2011-02-21 NOTE — ED Notes (Signed)
Pt asleep in room all am, awakened by ACT team once awake out of room to bathroom, pt to window expressing concern about length of stay at [facility], after RH addressed concern with length pt back to room, writer in room, pt denies AH/VH,HI/SI, states I told MD that I wasn't hearing voices or seeing things anymore, that I just want to be put back on my medicine. Pt educated about medications that were ordered and when they would be administered, pt response is OK

## 2011-02-21 NOTE — ED Notes (Signed)
ACT in room speaking with pt

## 2011-02-22 DIAGNOSIS — F251 Schizoaffective disorder, depressive type: Secondary | ICD-10-CM | POA: Diagnosis present

## 2011-02-22 DIAGNOSIS — F209 Schizophrenia, unspecified: Secondary | ICD-10-CM

## 2011-02-22 MED ORDER — TOLNAFTATE 1 % EX POWD
Freq: Two times a day (BID) | CUTANEOUS | Status: DC
  Filled 2011-02-22: qty 45

## 2011-02-22 MED ORDER — CLONAZEPAM 1 MG PO TABS: 1.0000 mg | ORAL_TABLET | Freq: Three times a day (TID) | ORAL | Status: DC | PRN

## 2011-02-22 MED ORDER — TRAZODONE HCL 100 MG PO TABS: 100.0000 mg | ORAL_TABLET | Freq: Every day | ORAL | Status: DC

## 2011-02-22 MED ORDER — TOLNAFTATE 1 % EX POWD: Freq: Two times a day (BID) | CUTANEOUS | Status: AC

## 2011-02-22 MED ORDER — SERTRALINE HCL 50 MG PO TABS: 50.0000 mg | ORAL_TABLET | Freq: Every day | ORAL | Status: DC

## 2011-02-22 MED ORDER — DIPHENHYDRAMINE HCL 50 MG PO CAPS: ORAL_CAPSULE | ORAL | Status: DC

## 2011-02-22 MED ORDER — HALOPERIDOL DECANOATE 100 MG/ML IM SOLN: 150.0000 mg | INTRAMUSCULAR | Status: DC

## 2011-02-22 MED ORDER — HALOPERIDOL 5 MG PO TABS: 5.0000 mg | ORAL_TABLET | Freq: Two times a day (BID) | ORAL | Status: AC

## 2011-02-22 NOTE — Tx Team (Signed)
Interdisciplinary Treatment Plan Update (Adult)  Date:  02/22/2011  Time Reviewed:  10:08 AM   Progress in Treatment: Attending groups:  Yes Participating in groups:    Yes Taking medication as prescribed:    Yes, no refusals Tolerating medication:   Yes, no reported or noted side effects.  Does not like taking the Haldol injection because "it scars", and doctor encouraged her to talk to outpatient psychiatrist about going on just pills Family/Significant other contact made:  No, states has nobody Patient understands diagnosis:   Yes Discussing patient identified problems/goals with staff:   Yes Medical problems stabilized or resolved:   Yes Denies suicidal/homicidal ideation:  Yes, states has never had SI during this admission Issues/concerns per patient self-inventory:   None Other:  New problem(s) identified: No, Describe:    Reason for Continuation of Hospitalization: None  Interventions implemented related to continuation of hospitalization:  None  Additional comments:  Not applicable  Estimated length of stay:  Discharge today  Discharge Plan:     Go back to her apartment and follow up with [facility]  New goal(s):  Not applicable  Review of initial/current patient goals per problem list:   1.  Goal(s):  Decide if/how to address substance abuse.  Met:  No  Target date:  By Discharge   As evidenced by:  Will talk to [facility] doctor about this  2.  Goal(s):  Reduce psychotic symptoms to baseline.  Met:  Yes  Target date:  By Discharge   As evidenced by:  Reports no auditory or visual hallucinations  3.  Goal(s):  Ensure that follow-up is arranged for her to go back on medication.  Met:  Yes  Target date:  By Discharge   As evidenced by:  Follow up arranged with [facility]    Attendees: Patient:  [name]  02/22/2011 11:02 AM   Family:     Physician:  Dr. Harvie Heck Readling 02/22/2011 11:02 AM   Nursing:   Izola Price, RN 02/22/2011   11:02 AM     Case Manager:  Ambrose Mantle, LCSW 02/22/2011  11:02 AM   Counselor:     Other:   Cato Mulligan, RN 02/22/2011  11:02 AM   Other:      Other:      Other:       Scribe for Treatment Team:   Sarina Ser, 02/22/2011, 10:08 AM

## 2011-02-22 NOTE — Progress Notes (Signed)
[facility] Case Management Discharge Plan:  Will you be returning to the same living situation after discharge: Yes,  has apartment by herself Would you like a referral for services when you are discharged:Yes,  go back to [place] Do you have access to transportation at discharge:No.  Asks for a bus pass Do you have the ability to pay for your medications:Yes,  has insurance  Interagency Information:   Release of Information for [facility] has been completed for patient's signature at discharge  Patient to Follow up at:  Follow-up Information    Follow up with Dr. Ladona Ridgel at [place] on 03/02/2011. (2:45PM appointment)    Contact information:   [address] Telephone:  [phone]         Patient denies SI/HI:   Yes,      Safety Planning and Suicide Prevention discussed:  Yes,  suicide prevention education provided, and patient was given brochure re same  Barrier to discharge identified:No.  Summary and Recommendations:  There are no remaining barriers to discharge.   Sarina Ser 02/22/2011, 11:03 AM

## 2011-02-22 NOTE — H&P (Signed)
  Identifying information:  This is a single 25 year old [place] American female this is a involuntary admission.  History of present illness:  [name] presented on self referral to [facility] long emergency room complaining of exacerbation of auditory hallucinations. She said that she could to see demons running around her at times and that could hear their voices talking to her. She been off of her of previous psychiatric medications since the end of October. She denied any suicidal or homicidal thoughts and was asking for help getting back on medications. She was given an injection of 150 mg of Haldol Decanoate IM in the emergency room and referred for red 24 hours of observation. She will be discharged today.  Please refer to the discharge summary which has been dictated.   Discharge diagnosis is schizoaffective disorder.

## 2011-02-22 NOTE — Progress Notes (Signed)
Suicide Risk Assessment  Discharge Assessment     Demographic factors:  Assessment Details Time of Assessment: Admission Information Obtained From: Patient Current Mental Status:  Current Mental Status:  (pt denies all statements) Risk Reduction Factors:  Risk Reduction Factors: Living with another person, especially a relative;Positive social support;Positive therapeutic relationship;Positive coping skills or problem solving skills  CLINICAL FACTORS:  Severe Anxiety and/or Agitation  Panic Attacks  Previous Psychiatric Diagnoses and Treatments   COGNITIVE FEATURES THAT CONTRIBUTE TO RISK:  No Cognitive impairment noted.   Diagnosis:  Axis I: Schizoaffective Disorder - Depressed Type.   The patient was seen today and reports the following:   Sleep: The patient reports to sleeping well last night.  Appetite: The patient reports a good appetite.   Mild>(1-10) >Severe  Hopelessness (1-10): 0  Depression (1-10): 0  Anxiety (1-10): 0   Suicidal Ideation: The patient adamantly denies any current suicidal ideations today.  Plan: No  Intent: No  Means: No   Homicidal Ideation: The patient adamantly denies any homicidal ideations today.  Plan: No  Intent: No.  Means: No   Eye Contact: Good.  General Appearance [name]: Neat and Clean today and well mannered.  Motor Behavior: WNL.  Speech: Appropriate in rate and volume with no pressuring of speech noted.  Mental Status: AO x 3 today.  Level of Consciousness: Alert and oriented x 3..  Mood: Euthymic.  Affect: Bright and Full.  Anxiety: None reported or observed.  Thought Process: WNL.  Thought Content: No current auditory or visual hallucinations or delusional thinking reported.  Perception: WNL.  Judgment: Good.  Insight: Good.  Cognition: Orientation time, place and person.   The patient is denying any depression, anxiety or suicidal or homicidal ideations.  She is also denying any auditory or visual hallucinations  or delusional thinking.  She is being discharged today at her request to outpatient follow-up.  Treatment Plan Summary:  1. Daily contact with patient to assess and evaluate symptoms and progress in treatment  2. Medication management  3. The patient will deny suicidal ideations or homicidal ideations for 48 hours prior to discharge and have a depression and anxiety rating of 3 or less. The patient will also deny any auditory or visual hallucinations or delusional thinking or display any manic or hypomanic behaviors.   Plan:  1. Will continue the patient on her outpatient medications. 2. Will continue to monitor. 3. The patient will be discharged today to outpatient follow-up. 4. She will be allowed to sign for voluntary care.  SUICIDE RISK:   Minimal: No identifiable suicidal ideation.  Patients presenting with no risk factors but with morbid ruminations; may be classified as minimal risk based on the severity of the depressive symptoms   [name] 02/22/2011, 1:02 PM

## 2011-02-22 NOTE — Progress Notes (Signed)
Patient ID: [name], female   DOB: 04-01-1985, 25 y.o.   MRN: [id number] Writer reviewed discharge instructions with pt including medications, follow up care and crisis intervention. Pt verbally acknowledged understanding, and writer answered all questions. Pt denies SI/HI and AVH and has no reservations about leaving [facility] at this time. Writer returned pt belongings from locker and arraigned for security to assist pt to bus stop. Pt is released into her own care.

## 2011-02-22 NOTE — Progress Notes (Signed)
Suicide Risk Assessment  Admission Assessment     Demographic factors:  Assessment Details Time of Assessment: Admission Information Obtained From: Patient Current Mental Status:  Current Mental Status:  (pt denies all statements) Loss Factors:  Loss Factors: Decline in physical health Historical Factors:  Historical Factors: Prior suicide attempts;Family history of suicide;Family history of mental illness or substance abuse Risk Reduction Factors:  Risk Reduction Factors: Living with another person, especially a relative;Positive social support;Positive therapeutic relationship;Positive coping skills or problem solving skills  CLINICAL FACTORS:  Severe Anxiety and/or Agitation  Panic Attacks  Previous Psychiatric Diagnoses and Treatments   COGNITIVE FEATURES THAT CONTRIBUTE TO RISK:  No Cognitive impairment noted.   Diagnosis:  Axis I: Schizoaffective Disorder - Depressed Type.   The patient was seen today and reports the following:   Sleep: The patient reports to sleeping well last night.  Appetite: The patient reports a good appetite.   Mild>(1-10) >Severe  Hopelessness (1-10): 0  Depression (1-10): 0  Anxiety (1-10): 0   Suicidal Ideation: The patient adamantly denies any current suicidal ideations today.  Plan: No  Intent: No  Means: No   Homicidal Ideation: The patient adamantly denies any homicidal ideations today.  Plan: No  Intent: No.  Means: No   Eye Contact: Good.  General Appearance [name]: Neat and Clean today and well mannered.  Motor Behavior: WNL.  Speech: Appropriate in rate and volume with no pressuring of speech noted.  Mental Status: AO x 3 today.  Level of Consciousness: Alert and oriented x 3..  Mood: Euthymic.  Affect: Bright and Full.  Anxiety: None reported or observed.  Thought Process: WNL.  Thought Content: No current auditory or visual hallucinations or delusional thinking reported.  Perception: WNL.  Judgment: Good.  Insight:  Good.  Cognition: Orientation time, place and person.   The patient states that she came to [facility] in order to restart her psychiatric medications.  She states in [facility] she was given her medications and then told that she needed to come to [facility] for 24 hours for observation.  The patient states that she feels ready to go.  Treatment Plan Summary:  1. Daily contact with patient to assess and evaluate symptoms and progress in treatment  2. Medication management  3. The patient will deny suicidal ideations or homicidal ideations for 48 hours prior to discharge and have a depression and anxiety rating of 3 or less. The patient will also deny any auditory or visual hallucinations or delusional thinking or display any manic or hypomanic behaviors.   Plan:  1. Will continue the patient on her outpatient medications. 2. Will continue to monitor.  SUICIDE RISK:   Minimal: No identifiable suicidal ideation.  Patients presenting with no risk factors but with morbid ruminations; may be classified as minimal risk based on the severity of the depressive symptoms  Rosanne Wohlfarth 02/22/2011, 12:50 PM

## 2011-02-22 NOTE — Discharge Summary (Signed)
   Identifying information:  This is a 25 year old African American female this was an involuntary admission.  History of present illness:  [name] presented on self referral to [facility] long emergency room complaining of exacerbation of auditory hallucinations. She said that she could to see demons running around her at times and that could hear their voices talking to her. She been off of her of previous psychiatric medications since the end of October. She denied any suicidal or homicidal thoughts and was asking for help getting back on medications. She was given an injection of 150 mg of Haldol Decanoate IM in the emergency room and referred for red 24 hours of observation. She will be discharged today.  Discharge diagnosis:  Axis I: Schizoaffective disorder, acute exacerbation. Axis II: No diagnosis Axis III: Candida intertrigo, tinea corporis. Axis IV: Financial pressures with purchasing medication. Axis V: Current 58 past year not known.  Discharge medications:  Haldol Decanoate 150 mg IM Q. 28 days, last given 02/21/2011. Zoloft 50 mg daily. Clonazepam 1 mg by mouth 3 times a day when necessary anxiety. Trazodone 100 mg each bedtime. Tolnaftate powder 1% apply under breasts twice a day for 14 days. Haldol Decanoate 5 mg by mouth every morning and each bedtime.  Course of hospitalization:  Mordecai Maes was admitted [name] for 24 hours of observation, to our acute stabilization unit. She's fully alert in full contact with reality with no dangerous ideas. She presented requesting help getting her hallucinations under control. Last night she slept well. Appetite is good. Hallucinations are under control and she does not appear internally distracted. She is requesting discharge. She will be discharged to followup with [facility] mental health next week. She was discharged with a prescription for 30 days of her medications and 5 days of samples.  She is instructed to also follow up with Norberto Sorenson PCP @ [facility] for Tinea infection.

## 2011-02-22 NOTE — ED Provider Notes (Signed)
Medical screening examination/treatment/procedure(s) were performed by non-physician practitioner and as supervising physician I was immediately available for consultation/collaboration.   Rogelio Waynick, MD 02/22/11 0751 

## 2011-02-22 NOTE — Progress Notes (Signed)
Patient ID: [name], female   DOB: 20-Dec-1985, 25 y.o.   MRN: [id number]   25 year old AA female having a recurrence of candida intertrigo under both breasts - with itching and darkened skin changes - skin is intact.   Also has tinea corporis - ringworm pattern- across torso.  Discussed use of Lamisil for this, but instructed to follow-up with Norberto Sorenson PCP at [facility] at her scheduled appt next month because she may also need oral griseofulvin to achieve complete remission.

## 2011-02-23 NOTE — Progress Notes (Signed)
Patient Discharge Instructions:  After Visit Summary Faxed,  02/23/2011 Faxed to the [facility] Care provider:  02/23/2011 D/C Summary faxed 02/23/2011 Facesheet faxed 02/23/2011  Faxed to [facility] - Dr. Ladona Ridgel @ [phone]  Wandra Scot, 02/23/2011, 3:28 PM

## 2011-03-01 ENCOUNTER — Inpatient Hospital Stay (HOSPITAL_COMMUNITY): Admission: RE | Admit: 2011-03-01 | Payer: Medicaid Other | Source: Ambulatory Visit

## 2011-03-08 ENCOUNTER — Emergency Department (HOSPITAL_COMMUNITY)
Admission: EM | Admit: 2011-03-08 | Discharge: 2011-03-09 | Disposition: A | Discharge status: Home / Self Care | Payer: Medicaid Other | Attending: Emergency Medicine | Admitting: Emergency Medicine

## 2011-03-08 ENCOUNTER — Encounter (HOSPITAL_COMMUNITY): Payer: Self-pay | Admitting: Emergency Medicine

## 2011-03-08 DIAGNOSIS — R10819 Abdominal tenderness, unspecified site: Secondary | ICD-10-CM | POA: Insufficient documentation

## 2011-03-08 DIAGNOSIS — Z79899 Other long term (current) drug therapy: Secondary | ICD-10-CM | POA: Insufficient documentation

## 2011-03-08 DIAGNOSIS — K5900 Constipation, unspecified: Secondary | ICD-10-CM | POA: Insufficient documentation

## 2011-03-08 DIAGNOSIS — R109 Unspecified abdominal pain: Secondary | ICD-10-CM | POA: Insufficient documentation

## 2011-03-08 DIAGNOSIS — G40909 Epilepsy, unspecified, not intractable, without status epilepticus: Secondary | ICD-10-CM | POA: Insufficient documentation

## 2011-03-08 DIAGNOSIS — Z87442 Personal history of urinary calculi: Secondary | ICD-10-CM | POA: Insufficient documentation

## 2011-03-08 NOTE — ED Notes (Signed)
[id number]<AV> Expected date:03/08/11<BR> Expected time:11:20 PM<BR> Means of arrival:Ambulance<BR> Comments:<BR> abd pain

## 2011-03-09 ENCOUNTER — Emergency Department (HOSPITAL_COMMUNITY): Payer: Medicaid Other

## 2011-03-09 LAB — COMPREHENSIVE METABOLIC PANEL
ALT: 13 U/L (ref 0–35)
AST: 17 U/L (ref 0–37)
CO2: 24 mEq/L (ref 19–32)
Calcium: 9.3 mg/dL (ref 8.4–10.5)
Chloride: 103 mEq/L (ref 96–112)
Creatinine, Ser: 1.26 mg/dL — ABNORMAL HIGH (ref 0.50–1.10)
GFR calc Af Amer: 68 mL/min — ABNORMAL LOW (ref >90)
GFR calc non Af Amer: 58 mL/min — ABNORMAL LOW (ref >90)
Glucose, Bld: 109 mg/dL — ABNORMAL HIGH (ref 70–99)
Total Bilirubin: 0.2 mg/dL — ABNORMAL LOW (ref 0.3–1.2)

## 2011-03-09 LAB — DIFFERENTIAL
Basophils Absolute: 0 10*3/uL (ref 0.0–0.1)
Eosinophils Relative: 0 % (ref 0–5)
Lymphocytes Relative: 40 % (ref 12–46)
Lymphs Abs: 3.8 10*3/uL (ref 0.7–4.0)
Monocytes Absolute: 0.5 10*3/uL (ref 0.1–1.0)
Neutro Abs: 5.2 10*3/uL (ref 1.7–7.7)

## 2011-03-09 LAB — CBC
HCT: 32.7 % — ABNORMAL LOW (ref 36.0–46.0)
Hemoglobin: 10.8 g/dL — ABNORMAL LOW (ref 12.0–15.0)
MCV: 92.6 fL (ref 78.0–100.0)
RBC: 3.53 MIL/uL — ABNORMAL LOW (ref 3.87–5.11)
WBC: 9.5 10*3/uL (ref 4.0–10.5)

## 2011-03-09 LAB — URINALYSIS, ROUTINE W REFLEX MICROSCOPIC
Glucose, UA: NEGATIVE mg/dL
Protein, ur: NEGATIVE mg/dL
Specific Gravity, Urine: 1.02 (ref 1.005–1.030)
pH: 6.5 (ref 5.0–8.0)

## 2011-03-09 LAB — URINE MICROSCOPIC-ADD ON

## 2011-03-09 LAB — PREGNANCY, URINE: Preg Test, Ur: NEGATIVE

## 2011-03-09 MED ORDER — MORPHINE SULFATE 4 MG/ML IJ SOLN
6.0000 mg | Freq: Once | INTRAMUSCULAR | Status: AC
  Administered 2011-03-09: 6 mg via INTRAVENOUS
  Filled 2011-03-09 (×2): qty 1

## 2011-03-09 MED ORDER — POLYETHYLENE GLYCOL 3350 17 GM/SCOOP PO POWD: 17.0000 g | Freq: Every day | ORAL | Status: DC

## 2011-03-09 MED ORDER — OXYCODONE-ACETAMINOPHEN 5-325 MG PO TABS: 1.0000 | ORAL_TABLET | Freq: Four times a day (QID) | ORAL | Status: DC | PRN

## 2011-03-09 MED ORDER — HYDROMORPHONE HCL PF 1 MG/ML IJ SOLN
1.0000 mg | Freq: Once | INTRAMUSCULAR | Status: AC
  Administered 2011-03-09: 1 mg via INTRAVENOUS
  Filled 2011-03-09: qty 1

## 2011-03-09 MED ORDER — ONDANSETRON HCL 4 MG/2ML IJ SOLN
4.0000 mg | Freq: Once | INTRAMUSCULAR | Status: AC
  Administered 2011-03-09: 4 mg via INTRAVENOUS
  Filled 2011-03-09: qty 2

## 2011-03-09 MED ORDER — SODIUM CHLORIDE 0.9 % IV BOLUS (SEPSIS)
1000.0000 mL | Freq: Once | INTRAVENOUS | Status: AC
  Administered 2011-03-09: 1000 mL via INTRAVENOUS

## 2011-03-09 NOTE — ED Provider Notes (Signed)
Medical screening examination/treatment/procedure(s) were performed by non-physician practitioner and as supervising physician I was immediately available for consultation/collaboration.   Hanley Seamen, MD 03/09/11 [phone]

## 2011-03-09 NOTE — ED Provider Notes (Signed)
History     CSN: [id number]  Arrival date & time 03/08/11  2335   First MD Initiated Contact with Patient 03/08/11 2344      Chief Complaint  Patient presents with  . Abdominal Pain    lower abd pain     HPI  History provided by the patient. Patient is a 26 year old female with past history of kidney stones, bilateral oophorectomy,  schizoaffective disorder, epilepsy who presents with continued lower abdomen pains for the past several weeks. Patient states that she feels like pains are coming from her uterus. She was seen previously in the emergency room for these complaints has followed with her PCP. Pain is described as intermittent cramping sensations. Pain is not improved with bowel movement. Patient denies any constipation or diarrhea. She denies any dysuria, hematuria, urinary frequency, vaginal discharge or vaginal bleeding. Patient denies any sexual intercourse for the past 6 months. Patient denies any fever, chills or sweats. She denies any nausea vomiting.    Past Medical History  Diagnosis Date  . 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 Brain tumor   . Kidney stones   . Schizoaffective disorder   . Anxiety   . Seizures   . Hypertension   . Depression   . Cancer 1991    ovarian    Past Surgical History  Procedure Date  . Abdominal hysterectomy     partial  . Kidney stone removal   . Appendectomy     Family History  Problem Relation Age of Onset  . Diabetes type II Other     History  Substance Use Topics  . Smoking status: Current Everyday Smoker -- 1.0 packs/day  . Smokeless tobacco: Not on file  . Alcohol Use: No    OB History    Grav Para Term Preterm Abortions TAB SAB Ect Mult Living                  Review of Systems  Constitutional: Negative for fever and chills.  Respiratory: Negative for cough and shortness of breath.   Cardiovascular: Negative for chest pain.  Gastrointestinal: Positive for abdominal pain. Negative for  nausea, vomiting, diarrhea and constipation.  Genitourinary: Negative for dysuria, frequency, hematuria, flank pain, vaginal bleeding, vaginal discharge and vaginal pain.  All other systems reviewed and are negative.    Allergies  Lortab; Toradol; and Tramadol  Home Medications   Current Outpatient Rx  Name Route Sig Dispense Refill  . CLONAZEPAM 1 MG PO TABS Oral Take 1 tablet (1 mg total) by mouth 3 (three) times daily as needed (anxiety). 90 tablet 0  . HALOPERIDOL 5 MG PO TABS Oral Take 1 tablet (5 mg total) by mouth 2 (two) times daily. For clear thoughts. 60 tablet 0  . HALOPERIDOL DECANOATE 100 MG/ML IM SOLN Intramuscular Inject 1.5 mLs (150 mg total) into the muscle every 28 (twenty-eight) days. Last dose given 02/21/2011. 1 mL 0  . IBUPROFEN 200 MG PO TABS Oral Take 400 mg by mouth every 6 (six) hours as needed. For foot pain     . SERTRALINE HCL 50 MG PO TABS Oral Take 1 tablet (50 mg total) by mouth daily. For depression 30 tablet 0  . TRAZODONE HCL 100 MG PO TABS Oral Take 1 tablet (100 mg total) by mouth at bedtime. For depression 30 tablet 0    BP 143/80  Pulse 91  Temp(Src) 98.7 F (37.1 C) (Oral)  SpO2 98%  Physical Exam  Nursing  note and vitals reviewed. Constitutional: She is oriented to person, place, and time. She appears well-developed and well-nourished. No distress.       Obese  HENT:  Head: Normocephalic and atraumatic.  Mouth/Throat: Oropharynx is clear and moist.  Neck: Normal range of motion.  Cardiovascular: Normal rate and regular rhythm.   No murmur heard. Pulmonary/Chest: Effort normal and breath sounds normal. No respiratory distress. She has no wheezes. She has no rales.  Abdominal: Soft. She exhibits no distension. There is tenderness in the right lower quadrant, periumbilical area, suprapubic area and left lower quadrant. There is no rigidity, no rebound, no guarding, no CVA tenderness, no tenderness at McBurney's point and negative Murphy's  sign.       Mild to moderate bilateral lower abdominal tenderness. Abdominal exam limited by body habitus.  Neurological: She is alert and oriented to person, place, and time.  Skin: Skin is warm and dry. No rash noted.  Psychiatric: She has a normal mood and affect. Her behavior is normal.    ED Course  Procedures (including critical care time)   Labs Reviewed  URINALYSIS, ROUTINE W REFLEX MICROSCOPIC  CBC  DIFFERENTIAL  COMPREHENSIVE METABOLIC PANEL  LIPASE, BLOOD  PREGNANCY, URINE   Results for orders placed during the hospital encounter of 03/08/11  URINALYSIS, ROUTINE W REFLEX MICROSCOPIC      Component Value Range   Color, Urine YELLOW  YELLOW    APPearance CLOUDY (*) CLEAR    Specific Gravity, Urine 1.020  1.005 - 1.030    pH 6.5  5.0 - 8.0    Glucose, UA NEGATIVE  NEGATIVE (mg/dL)   Hgb urine dipstick SMALL (*) NEGATIVE    Bilirubin Urine NEGATIVE  NEGATIVE    Ketones, ur NEGATIVE  NEGATIVE (mg/dL)   Protein, ur NEGATIVE  NEGATIVE (mg/dL)   Urobilinogen, UA 1.0  0.0 - 1.0 (mg/dL)   Nitrite NEGATIVE  NEGATIVE    Leukocytes, UA SMALL (*) NEGATIVE   CBC      Component Value Range   WBC 9.5  4.0 - 10.5 (K/uL)   RBC 3.53 (*) 3.87 - 5.11 (MIL/uL)   Hemoglobin 10.8 (*) 12.0 - 15.0 (g/dL)   HCT 16.1 (*) 09.6 - 46.0 (%)   MCV 92.6  78.0 - 100.0 (fL)   MCH 30.6  26.0 - 34.0 (pg)   MCHC 33.0  30.0 - 36.0 (g/dL)   RDW 04.5  40.9 - 81.1 (%)   Platelets 365  150 - 400 (K/uL)  DIFFERENTIAL      Component Value Range   Neutrophils Relative 54  43 - 77 (%)   Neutro Abs 5.2  1.7 - 7.7 (K/uL)   Lymphocytes Relative 40  12 - 46 (%)   Lymphs Abs 3.8  0.7 - 4.0 (K/uL)   Monocytes Relative 6  3 - 12 (%)   Monocytes Absolute 0.5  0.1 - 1.0 (K/uL)   Eosinophils Relative 0  0 - 5 (%)   Eosinophils Absolute 0.0  0.0 - 0.7 (K/uL)   Basophils Relative 0  0 - 1 (%)   Basophils Absolute 0.0  0.0 - 0.1 (K/uL)  COMPREHENSIVE METABOLIC PANEL      Component Value Range   Sodium 139   135 - 145 (mEq/L)   Potassium 3.4 (*) 3.5 - 5.1 (mEq/L)   Chloride 103  96 - 112 (mEq/L)   CO2 24  19 - 32 (mEq/L)   Glucose, Bld 109 (*) 70 - 99 (mg/dL)   BUN  12  6 - 23 (mg/dL)   Creatinine, Ser 1.61 (*) 0.50 - 1.10 (mg/dL)   Calcium 9.3  8.4 - 09.6 (mg/dL)   Total Protein 7.5  6.0 - 8.3 (g/dL)   Albumin 3.8  3.5 - 5.2 (g/dL)   AST 17  0 - 37 (U/L)   ALT 13  0 - 35 (U/L)   Alkaline Phosphatase 93  39 - 117 (U/L)   Total Bilirubin 0.2 (*) 0.3 - 1.2 (mg/dL)   GFR calc non Af Amer 58 (*) >90 (mL/min)   GFR calc Af Amer 68 (*) >90 (mL/min)  LIPASE, BLOOD      Component Value Range   Lipase 31  11 - 59 (U/L)  PREGNANCY, URINE      Component Value Range   Preg Test, Ur NEGATIVE    URINE MICROSCOPIC-ADD ON      Component Value Range   Squamous Epithelial / LPF MANY (*) RARE    WBC, UA 3-6  <3 (WBC/hpf)   RBC / HPF 3-6  <3 (RBC/hpf)   Bacteria, UA FEW (*) RARE    Crystals CA OXALATE CRYSTALS (*) NEGATIVE    Urine-Other MUCOUS PRESENT       Dg Abd 1 View  03/09/2011  *RADIOLOGY REPORT*  Clinical Data: Right-sided abdominal pain and cramping for 3 days.  ABDOMEN - 1 VIEW  Comparison: None.  Findings: Normal bowel gas pattern.  No dilated loops of large or small bowel.  Stool and bowel gas extend to the rectum.  Study is degraded by pannus projecting over the pelvis.  Prominent right- sided stool burden is present.  IMPRESSION: No acute abnormality.  Nonobstructive bowel gas pattern with prominent right-sided stool burden.  Original Report Authenticated By: Andreas Newport, M.D.     1. Constipation   2. Abdominal pain       MDM  11:50 PM patient seen and evaluated. Patient in no acute distress.  Past notes, lab tests and images reviewed. Patient was seen for similar complaints in the last 3 weeks. She had normal pelvic ultrasound at that time.  Isn't having improvement of symptoms after medications. I discussed lab test findings and x-ray findings. Have recommended use for a  stool softener. Patient agrees with plan.      Angus Seller, PA 03/09/11 Jeralyn Bennett

## 2011-03-12 ENCOUNTER — Encounter (HOSPITAL_COMMUNITY): Payer: Self-pay | Admitting: *Deleted

## 2011-03-12 ENCOUNTER — Emergency Department (HOSPITAL_COMMUNITY)
Admission: EM | Admit: 2011-03-12 | Discharge: 2011-03-12 | Disposition: A | Discharge status: Home / Self Care | Payer: Medicaid Other | Attending: Emergency Medicine | Admitting: Emergency Medicine

## 2011-03-12 ENCOUNTER — Emergency Department (HOSPITAL_COMMUNITY): Payer: Medicaid Other

## 2011-03-12 DIAGNOSIS — Z8543 Personal history of malignant neoplasm of ovary: Secondary | ICD-10-CM | POA: Insufficient documentation

## 2011-03-12 DIAGNOSIS — R109 Unspecified abdominal pain: Secondary | ICD-10-CM | POA: Insufficient documentation

## 2011-03-12 DIAGNOSIS — K7689 Other specified diseases of liver: Secondary | ICD-10-CM | POA: Insufficient documentation

## 2011-03-12 DIAGNOSIS — G40909 Epilepsy, unspecified, not intractable, without status epilepticus: Secondary | ICD-10-CM | POA: Insufficient documentation

## 2011-03-12 DIAGNOSIS — R10819 Abdominal tenderness, unspecified site: Secondary | ICD-10-CM | POA: Insufficient documentation

## 2011-03-12 DIAGNOSIS — N200 Calculus of kidney: Secondary | ICD-10-CM | POA: Insufficient documentation

## 2011-03-12 DIAGNOSIS — F17200 Nicotine dependence, unspecified, uncomplicated: Secondary | ICD-10-CM | POA: Insufficient documentation

## 2011-03-12 DIAGNOSIS — Z79899 Other long term (current) drug therapy: Secondary | ICD-10-CM | POA: Insufficient documentation

## 2011-03-12 DIAGNOSIS — K5900 Constipation, unspecified: Secondary | ICD-10-CM | POA: Insufficient documentation

## 2011-03-12 DIAGNOSIS — I10 Essential (primary) hypertension: Secondary | ICD-10-CM | POA: Insufficient documentation

## 2011-03-12 LAB — URINALYSIS, ROUTINE W REFLEX MICROSCOPIC
Nitrite: NEGATIVE
Protein, ur: 30 mg/dL — AB
Urobilinogen, UA: 2 mg/dL — ABNORMAL HIGH (ref 0.0–1.0)
pH: 7 (ref 5.0–8.0)

## 2011-03-12 LAB — CBC
HCT: 33.5 % — ABNORMAL LOW (ref 36.0–46.0)
Hemoglobin: 10.8 g/dL — ABNORMAL LOW (ref 12.0–15.0)
MCH: 30 pg (ref 26.0–34.0)
MCHC: 32.2 g/dL (ref 30.0–36.0)

## 2011-03-12 LAB — URINE MICROSCOPIC-ADD ON

## 2011-03-12 LAB — COMPREHENSIVE METABOLIC PANEL
Albumin: 3.6 g/dL (ref 3.5–5.2)
BUN: 11 mg/dL (ref 6–23)
Chloride: 103 mEq/L (ref 96–112)
Creatinine, Ser: 1.42 mg/dL — ABNORMAL HIGH (ref 0.50–1.10)
GFR calc Af Amer: 58 mL/min — ABNORMAL LOW (ref >90)
GFR calc non Af Amer: 50 mL/min — ABNORMAL LOW (ref >90)
Total Bilirubin: 0.2 mg/dL — ABNORMAL LOW (ref 0.3–1.2)

## 2011-03-12 LAB — LIPASE, BLOOD: Lipase: 37 U/L (ref 11–59)

## 2011-03-12 LAB — DIFFERENTIAL
Basophils Relative: 0 % (ref 0–1)
Monocytes Absolute: 0.5 10*3/uL (ref 0.1–1.0)
Monocytes Relative: 5 % (ref 3–12)
Neutro Abs: 4.7 10*3/uL (ref 1.7–7.7)

## 2011-03-12 MED ORDER — DOCUSATE SODIUM 100 MG PO CAPS: 100.0000 mg | ORAL_CAPSULE | Freq: Two times a day (BID) | ORAL | Status: DC

## 2011-03-12 MED ORDER — MAGNESIUM CITRATE PO SOLN: 296.0000 mL | Freq: Once | ORAL | Status: DC

## 2011-03-12 MED ORDER — OXYCODONE-ACETAMINOPHEN 5-325 MG PO TABS
ORAL_TABLET | ORAL | Status: AC
  Administered 2011-03-12: 1
  Filled 2011-03-12: qty 1

## 2011-03-12 MED ORDER — HYDROMORPHONE HCL PF 1 MG/ML IJ SOLN
1.0000 mg | Freq: Once | INTRAMUSCULAR | Status: AC
  Administered 2011-03-12: 1 mg via INTRAVENOUS
  Filled 2011-03-12: qty 1

## 2011-03-12 MED ORDER — ONDANSETRON 4 MG PO TBDP
4.0000 mg | ORAL_TABLET | Freq: Once | ORAL | Status: AC
  Administered 2011-03-12: 4 mg via ORAL
  Filled 2011-03-12: qty 1

## 2011-03-12 MED ORDER — SODIUM CHLORIDE 0.9 % IV BOLUS (SEPSIS)
1000.0000 mL | Freq: Once | INTRAVENOUS | Status: AC
  Administered 2011-03-12: 1000 mL via INTRAVENOUS

## 2011-03-12 MED ORDER — FAMOTIDINE 20 MG PO TABS: 20.0000 mg | ORAL_TABLET | Freq: Two times a day (BID) | ORAL | Status: DC

## 2011-03-12 MED ORDER — POLYETHYLENE GLYCOL 3350 17 GM/SCOOP PO POWD: 17.0000 g | Freq: Two times a day (BID) | ORAL | Status: AC

## 2011-03-12 NOTE — ED Notes (Signed)
[id number]<ZH> Expected date:03/12/11<BR> Expected time:12:35 AM<BR> Means of arrival:Ambulance<BR> Comments:<BR> Constipation

## 2011-03-12 NOTE — ED Notes (Signed)
Pt states, "I'm dehydrated, I need IV fluids." Pt informed that EDP will see pt and order IV if needed. Pt treated with IV fluids during previous ER visit.

## 2011-03-12 NOTE — ED Provider Notes (Signed)
History     CSN: [id number]  Arrival date & time 03/12/11  7829   First MD Initiated Contact with Patient 03/12/11 0206      Chief Complaint  Patient presents with  . Abdominal Pain    per ems pt c/o abd pain, pt was seen for same on 1/10 and diagnosed with constipation. pt reports losing prescription and experiencing n/v tonight.     (Consider location/radiation/quality/duration/timing/severity/associated sxs/prior treatment) HPI Comments: Patient is a morbidly obese 25 room female with a history of brain tumor, kidney stones, schizoaffective disorder, hypertension. She has also had ovarian cancer status post surgery in 1991. She presents with approximately 2-3 weeks of pain with eating. This pain is a sharp and stabbing pain that comes in her right upper quadrant after eating a meal. Tonight it is severe persistent and unremitting. It is associated with nausea and vomiting. There has been no diarrhea, no chest pain or shortness of breath, no fevers or chills, no back pain, no swelling or rashes. Symptoms are intermittent, she wakes up without pain every morning but has pain with eating. She has been seen recently and had an x-ray which showed possible constipation, laboratory evaluation including a cooperative metabolic panel and lipase were normal.  Patient is a 26 y.o. female presenting with abdominal pain. The history is provided by the patient and medical records.  Abdominal Pain The primary symptoms of the illness include abdominal pain.    Past Medical History  Diagnosis Date  . 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 Brain tumor   . Kidney stones   . Schizoaffective disorder   . Anxiety   . Seizures   . Hypertension   . Depression   . Cancer 1991    ovarian    Past Surgical History  Procedure Date  . Abdominal hysterectomy     partial  . Kidney stone removal   . Appendectomy     Family History  Problem Relation Age of Onset  . Diabetes type II Other       History  Substance Use Topics  . Smoking status: Current Everyday Smoker -- 1.0 packs/day  . Smokeless tobacco: Not on file  . Alcohol Use: No    OB History    Grav Para Term Preterm Abortions TAB SAB Ect Mult Living                  Review of Systems  Gastrointestinal: Positive for abdominal pain.  All other systems reviewed and are negative.    Allergies  Lortab; Toradol; and Tramadol  Home Medications   Current Outpatient Rx  Name Route Sig Dispense Refill  . CLONAZEPAM 1 MG PO TABS Oral Take 1 tablet (1 mg total) by mouth 3 (three) times daily as needed (anxiety). 90 tablet 0  . HALOPERIDOL 5 MG PO TABS Oral Take 1 tablet (5 mg total) by mouth 2 (two) times daily. For clear thoughts. 60 tablet 0  . HALOPERIDOL DECANOATE 100 MG/ML IM SOLN Intramuscular Inject 1.5 mLs (150 mg total) into the muscle every 28 (twenty-eight) days. Last dose given 02/21/2011. 1 mL 0  . IBUPROFEN 200 MG PO TABS Oral Take 400 mg by mouth every 6 (six) hours as needed. For foot pain     . SERTRALINE HCL 50 MG PO TABS Oral Take 1 tablet (50 mg total) by mouth daily. For depression 30 tablet 0  . TRAZODONE HCL 100 MG PO TABS Oral Take 1 tablet (100 mg  total) by mouth at bedtime. For depression 30 tablet 0  . DOCUSATE SODIUM 100 MG PO CAPS Oral Take 1 capsule (100 mg total) by mouth every 12 (twelve) hours. 30 capsule 0  . FAMOTIDINE 20 MG PO TABS Oral Take 1 tablet (20 mg total) by mouth 2 (two) times daily. 30 tablet 0  . MAGNESIUM CITRATE PO SOLN Oral Take 296 mLs by mouth once. OTC 300 mL 0  . POLYETHYLENE GLYCOL 3350 PO POWD Oral Take 17 g by mouth 2 (two) times daily. Until daily soft stools  OTC 255 g 0    BP 131/83  Pulse 90  Temp(Src) 98.5 F (36.9 C) (Oral)  Resp 20  SpO2 98%  Physical Exam  Nursing note and vitals reviewed. Constitutional: She appears well-developed and well-nourished. No distress.  HENT:  Head: Normocephalic and atraumatic.  Mouth/Throat: Oropharynx is  clear and moist. No oropharyngeal exudate.  Eyes: Conjunctivae and EOM are normal. Pupils are equal, round, and reactive to light. Right eye exhibits no discharge. Left eye exhibits no discharge. No scleral icterus.  Neck: Normal range of motion. Neck supple. No JVD present. No thyromegaly present.  Cardiovascular: Normal rate, regular rhythm, normal heart sounds and intact distal pulses.  Exam reveals no gallop and no friction rub.   No murmur heard. Pulmonary/Chest: Effort normal and breath sounds normal. No respiratory distress. She has no wheezes. She has no rales.  Abdominal: Soft. Bowel sounds are normal. She exhibits no distension and no mass. There is tenderness (tender to palpation with guarding in the right upper quadrant, minimal lower abdominal tenderness.).       Morbidly obese, non-peritoneal, no tenderness in the epigastrium or left upper quadrant  Musculoskeletal: Normal range of motion. She exhibits no edema and no tenderness.  Lymphadenopathy:    She has no cervical adenopathy.  Neurological: She is alert. Coordination normal.  Skin: Skin is warm and dry. No rash noted. No erythema.  Psychiatric: She has a normal mood and affect. Her behavior is normal.    ED Course  Procedures (including critical care time)  Labs Reviewed  CBC - Abnormal; Notable for the following:    RBC 3.60 (*)    Hemoglobin 10.8 (*)    HCT 33.5 (*)    All other components within normal limits  COMPREHENSIVE METABOLIC PANEL - Abnormal; Notable for the following:    Glucose, Bld 106 (*)    Creatinine, Ser 1.42 (*)    Total Bilirubin 0.2 (*)    GFR calc non Af Amer 50 (*)    GFR calc Af Amer 58 (*)    All other components within normal limits  URINALYSIS, ROUTINE W REFLEX MICROSCOPIC - Abnormal; Notable for the following:    APPearance CLOUDY (*)    Hgb urine dipstick SMALL (*)    Ketones, ur TRACE (*)    Protein, ur 30 (*)    Urobilinogen, UA 2.0 (*)    Leukocytes, UA SMALL (*)    All other  components within normal limits  URINE MICROSCOPIC-ADD ON - Abnormal; Notable for the following:    Squamous Epithelial / LPF MANY (*)    Bacteria, UA FEW (*)    Crystals CA OXALATE CRYSTALS (*)    All other components within normal limits  DIFFERENTIAL  LIPASE, BLOOD  PREGNANCY, URINE  PROTIME-INR  APTT   US Abdomen Complete  03/12/2011  *RADIOLOGY REPORT*  Clinical Data:  Right upper quadrant abdominal pain.  ABDOMINAL ULTRASOUND COMPLETE  Comparison:  CT of the abdomen and pelvis performed 11/25/2010, and abdominal ultrasound performed 04/18/2010  Findings:  Gallbladder:  The gallbladder is normal in appearance, without evidence for gallstones, gallbladder wall thickening or pericholecystic fluid.  No ultrasonographic Murphy's sign is elicited.  Common Bile Duct:  0.3 cm in diameter; within normal limits in caliber.  Liver:  Diffusely increased hepatic echogenicity and coarsened echotexture, compatible with fatty infiltration; no focal lesions identified.  Limited Doppler evaluation demonstrates normal blood flow within the liver.  IVC:  Unremarkable in appearance.  Pancreas:  Although the pancreas is difficult to visualize in its entirety due to overlying bowel gas, no focal pancreatic abnormality is identified.  Spleen:  8.2 cm in length; within normal limits in size and echotexture.  Right kidney:  12.1 cm in length; normal in size, configuration and parenchymal echogenicity.  No evidence of mass or hydronephrosis.  Left kidney:  11.5 cm in length; normal in size, configuration and parenchymal echogenicity.  No evidence of mass or hydronephrosis. An 8 mm stone is noted at the interpole region of the left kidney, mildly increased in size.  Abdominal Aorta:  Normal in caliber; no aneurysm identified.  The aortic bifurcation is not visualized due to overlying bowel gas.  IMPRESSION:  1.  Gallbladder unremarkable in appearance. 2.  Diffuse fatty infiltration within the liver. 3.  8 mm left renal stone  noted, mildly increased in size, without evidence of obstruction.  Original Report Authenticated By: Tonia Ghent, M.D.     1. Abdominal  pain, other specified site   2. Constipation       MDM  Morbidly obese female with Vital signs appear normal at this time. She is tender specifically in the right upper quadrant. Laboratory evaluation and imaging with ultrasound to rule out cholecystitis. Pain medication and Zofran given, IV fluids   X-ray reviewed from several days ago, shows prominent right-sided stool burden, no obstruction. Combined with labs today showing no signs of cholecystitis, hepatitis or pancreatitis and an ultrasound showing absolutely no signs of cholecystitis including no Murphy sign. We'll send patient home with Pepcid, constipation medications and have return to see her Dr. and the general surgeon should her symptoms persist.  Discharge prescriptions  #1 magnesium citrate #2 Colace #3 MiraLAX #4 Pepcid    Vida Roller, MD 03/12/11 [phone]

## 2011-03-12 NOTE — ED Notes (Signed)
US at bedside

## 2011-03-20 ENCOUNTER — Emergency Department (HOSPITAL_COMMUNITY)
Admission: EM | Admit: 2011-03-20 | Discharge: 2011-03-20 | Disposition: A | Discharge status: Home / Self Care | Payer: Medicaid Other | Attending: Emergency Medicine | Admitting: Emergency Medicine

## 2011-03-20 ENCOUNTER — Emergency Department (HOSPITAL_COMMUNITY): Payer: Medicaid Other

## 2011-03-20 ENCOUNTER — Encounter (HOSPITAL_COMMUNITY): Payer: Self-pay

## 2011-03-20 DIAGNOSIS — S0003XA Contusion of scalp, initial encounter: Secondary | ICD-10-CM | POA: Insufficient documentation

## 2011-03-20 DIAGNOSIS — F411 Generalized anxiety disorder: Secondary | ICD-10-CM | POA: Insufficient documentation

## 2011-03-20 DIAGNOSIS — R300 Dysuria: Secondary | ICD-10-CM | POA: Insufficient documentation

## 2011-03-20 DIAGNOSIS — Y92009 Unspecified place in unspecified non-institutional (private) residence as the place of occurrence of the external cause: Secondary | ICD-10-CM | POA: Insufficient documentation

## 2011-03-20 DIAGNOSIS — S20419A Abrasion of unspecified back wall of thorax, initial encounter: Secondary | ICD-10-CM

## 2011-03-20 DIAGNOSIS — S0093XA Contusion of unspecified part of head, initial encounter: Secondary | ICD-10-CM

## 2011-03-20 DIAGNOSIS — G40909 Epilepsy, unspecified, not intractable, without status epilepticus: Secondary | ICD-10-CM | POA: Insufficient documentation

## 2011-03-20 DIAGNOSIS — F3289 Other specified depressive episodes: Secondary | ICD-10-CM | POA: Insufficient documentation

## 2011-03-20 DIAGNOSIS — Z8543 Personal history of malignant neoplasm of ovary: Secondary | ICD-10-CM | POA: Insufficient documentation

## 2011-03-20 DIAGNOSIS — Z8659 Personal history of other mental and behavioral disorders: Secondary | ICD-10-CM | POA: Insufficient documentation

## 2011-03-20 DIAGNOSIS — IMO0002 Reserved for concepts with insufficient information to code with codable children: Secondary | ICD-10-CM | POA: Insufficient documentation

## 2011-03-20 DIAGNOSIS — Y998 Other external cause status: Secondary | ICD-10-CM | POA: Insufficient documentation

## 2011-03-20 DIAGNOSIS — F17200 Nicotine dependence, unspecified, uncomplicated: Secondary | ICD-10-CM | POA: Insufficient documentation

## 2011-03-20 DIAGNOSIS — F329 Major depressive disorder, single episode, unspecified: Secondary | ICD-10-CM | POA: Insufficient documentation

## 2011-03-20 DIAGNOSIS — I10 Essential (primary) hypertension: Secondary | ICD-10-CM | POA: Insufficient documentation

## 2011-03-20 DIAGNOSIS — Z79899 Other long term (current) drug therapy: Secondary | ICD-10-CM | POA: Insufficient documentation

## 2011-03-20 LAB — URINALYSIS, ROUTINE W REFLEX MICROSCOPIC
Bilirubin Urine: NEGATIVE
Glucose, UA: NEGATIVE mg/dL
Ketones, ur: NEGATIVE mg/dL
Nitrite: NEGATIVE
Specific Gravity, Urine: 1.017 (ref 1.005–1.030)
pH: 7 (ref 5.0–8.0)

## 2011-03-20 LAB — POCT I-STAT, CHEM 8
BUN: 14 mg/dL (ref 6–23)
Chloride: 107 mEq/L (ref 96–112)
Creatinine, Ser: 1.2 mg/dL — ABNORMAL HIGH (ref 0.50–1.10)
Hemoglobin: 11.9 g/dL — ABNORMAL LOW (ref 12.0–15.0)
Potassium: 3.9 mEq/L (ref 3.5–5.1)
Sodium: 142 mEq/L (ref 135–145)

## 2011-03-20 LAB — URINE MICROSCOPIC-ADD ON

## 2011-03-20 MED ORDER — SERTRALINE HCL 50 MG PO TABS
50.0000 mg | ORAL_TABLET | ORAL | Status: AC
  Administered 2011-03-20: 50 mg via ORAL
  Filled 2011-03-20: qty 1

## 2011-03-20 MED ORDER — SODIUM CHLORIDE 0.9 % IV BOLUS (SEPSIS)
1000.0000 mL | Freq: Once | INTRAVENOUS | Status: AC
  Administered 2011-03-20: 1000 mL via INTRAVENOUS

## 2011-03-20 MED ORDER — PERCOCET 5-325 MG PO TABS: 1.0000 | ORAL_TABLET | Freq: Four times a day (QID) | ORAL | Status: AC | PRN

## 2011-03-20 MED ORDER — CLONAZEPAM 0.5 MG PO TABS
1.0000 mg | ORAL_TABLET | ORAL | Status: AC
  Administered 2011-03-20: 1 mg via ORAL
  Filled 2011-03-20: qty 2

## 2011-03-20 MED ORDER — MORPHINE SULFATE 4 MG/ML IJ SOLN: 4.0000 mg | Freq: Once | INTRAMUSCULAR | Status: DC

## 2011-03-20 MED ORDER — MORPHINE SULFATE 4 MG/ML IJ SOLN
4.0000 mg | Freq: Once | INTRAMUSCULAR | Status: AC
  Administered 2011-03-20: 4 mg via INTRAVENOUS
  Filled 2011-03-20: qty 1

## 2011-03-20 MED ORDER — ONDANSETRON HCL 4 MG/2ML IJ SOLN
4.0000 mg | Freq: Once | INTRAMUSCULAR | Status: AC
  Administered 2011-03-20: 4 mg via INTRAVENOUS
  Filled 2011-03-20: qty 2

## 2011-03-20 MED ORDER — HALOPERIDOL 5 MG PO TABS
5.0000 mg | ORAL_TABLET | Freq: Once | ORAL | Status: AC
  Administered 2011-03-20: 5 mg via ORAL
  Filled 2011-03-20: qty 1
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
## 2011-03-20 NOTE — ED Provider Notes (Signed)
[name] S 8:15 PM patient discussed in sign out. UA pending.  8:45 PM UA and urine pregnancy unremarkable. Discharge paperwork and prescriptions have been printed previously. Plan to discharge patient at this time.  Angus Seller, [place] 03/20/11 2046

## 2011-03-21 NOTE — ED Provider Notes (Signed)
Medical screening examination/treatment/procedure(s) were performed by non-physician practitioner and as supervising physician I was immediately available for consultation/collaboration. Dorla Guizar, MD, FACEP   Huan Pollok L Taitum Alms, MD 03/21/11 0004 

## 2011-03-21 NOTE — ED Provider Notes (Signed)
Medical screening examination/treatment/procedure(s) were performed by non-physician practitioner and as supervising physician I was immediately available for consultation/collaboration. Devoria Albe, MD, FACEP   Ward Givens, MD 03/21/11 [phone]

## 2011-03-23 ENCOUNTER — Encounter (HOSPITAL_COMMUNITY): Payer: Self-pay | Admitting: *Deleted

## 2011-03-23 ENCOUNTER — Emergency Department (HOSPITAL_COMMUNITY)
Admission: EM | Admit: 2011-03-23 | Discharge: 2011-03-23 | Disposition: A | Discharge status: Home / Self Care | Payer: Medicaid Other | Attending: Emergency Medicine | Admitting: Emergency Medicine

## 2011-03-23 ENCOUNTER — Emergency Department (HOSPITAL_COMMUNITY): Payer: Medicaid Other

## 2011-03-23 DIAGNOSIS — R0781 Pleurodynia: Secondary | ICD-10-CM

## 2011-03-23 DIAGNOSIS — F3289 Other specified depressive episodes: Secondary | ICD-10-CM | POA: Insufficient documentation

## 2011-03-23 DIAGNOSIS — F411 Generalized anxiety disorder: Secondary | ICD-10-CM | POA: Insufficient documentation

## 2011-03-23 DIAGNOSIS — M2550 Pain in unspecified joint: Secondary | ICD-10-CM | POA: Insufficient documentation

## 2011-03-23 DIAGNOSIS — F17200 Nicotine dependence, unspecified, uncomplicated: Secondary | ICD-10-CM | POA: Insufficient documentation

## 2011-03-23 DIAGNOSIS — Z79899 Other long term (current) drug therapy: Secondary | ICD-10-CM | POA: Insufficient documentation

## 2011-03-23 DIAGNOSIS — I10 Essential (primary) hypertension: Secondary | ICD-10-CM | POA: Insufficient documentation

## 2011-03-23 DIAGNOSIS — F329 Major depressive disorder, single episode, unspecified: Secondary | ICD-10-CM | POA: Insufficient documentation

## 2011-03-23 DIAGNOSIS — R079 Chest pain, unspecified: Secondary | ICD-10-CM | POA: Insufficient documentation

## 2011-03-23 MED ORDER — OXYCODONE-ACETAMINOPHEN 5-325 MG PO TABS: 1.0000 | ORAL_TABLET | Freq: Four times a day (QID) | ORAL | Status: AC | PRN

## 2011-03-23 NOTE — ED Notes (Signed)
Pt also "my doctor gives me the percocer 7.5 and I need my tussionex cause I can't get it unitl I see her on 2/19"

## 2011-03-23 NOTE — ED Provider Notes (Signed)
Medical screening examination/treatment/procedure(s) were performed by non-physician practitioner and as supervising physician I was immediately available for consultation/collaboration.   Yotam Rhine A. Eldean Nanna, MD 03/23/11 1934 
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
## 2011-04-03 ENCOUNTER — Emergency Department (HOSPITAL_COMMUNITY)
Admission: EM | Admit: 2011-04-03 | Discharge: 2011-04-03 | Disposition: A | Discharge status: Home / Self Care | Payer: Medicaid Other | Attending: Emergency Medicine | Admitting: Emergency Medicine

## 2011-04-03 ENCOUNTER — Encounter (HOSPITAL_COMMUNITY): Payer: Self-pay

## 2011-04-03 DIAGNOSIS — F17200 Nicotine dependence, unspecified, uncomplicated: Secondary | ICD-10-CM | POA: Insufficient documentation

## 2011-04-03 DIAGNOSIS — R070 Pain in throat: Secondary | ICD-10-CM | POA: Insufficient documentation

## 2011-04-03 DIAGNOSIS — G40909 Epilepsy, unspecified, not intractable, without status epilepticus: Secondary | ICD-10-CM | POA: Insufficient documentation

## 2011-04-03 DIAGNOSIS — E669 Obesity, unspecified: Secondary | ICD-10-CM | POA: Insufficient documentation

## 2011-04-03 DIAGNOSIS — R5381 Other malaise: Secondary | ICD-10-CM | POA: Insufficient documentation

## 2011-04-03 DIAGNOSIS — Z8543 Personal history of malignant neoplasm of ovary: Secondary | ICD-10-CM | POA: Insufficient documentation

## 2011-04-03 DIAGNOSIS — R509 Fever, unspecified: Secondary | ICD-10-CM | POA: Insufficient documentation

## 2011-04-03 DIAGNOSIS — Z8659 Personal history of other mental and behavioral disorders: Secondary | ICD-10-CM | POA: Insufficient documentation

## 2011-04-03 DIAGNOSIS — R112 Nausea with vomiting, unspecified: Secondary | ICD-10-CM | POA: Insufficient documentation

## 2011-04-03 DIAGNOSIS — F341 Dysthymic disorder: Secondary | ICD-10-CM | POA: Insufficient documentation

## 2011-04-03 DIAGNOSIS — Z79899 Other long term (current) drug therapy: Secondary | ICD-10-CM | POA: Insufficient documentation

## 2011-04-03 DIAGNOSIS — R5383 Other fatigue: Secondary | ICD-10-CM | POA: Insufficient documentation

## 2011-04-03 DIAGNOSIS — I10 Essential (primary) hypertension: Secondary | ICD-10-CM | POA: Insufficient documentation

## 2011-04-03 LAB — URINALYSIS, ROUTINE W REFLEX MICROSCOPIC
Glucose, UA: NEGATIVE mg/dL
Ketones, ur: NEGATIVE mg/dL
Leukocytes, UA: NEGATIVE
Protein, ur: NEGATIVE mg/dL
Urobilinogen, UA: 1 mg/dL (ref 0.0–1.0)

## 2011-04-03 LAB — CBC
HCT: 35.4 % — ABNORMAL LOW (ref 36.0–46.0)
Hemoglobin: 11.5 g/dL — ABNORMAL LOW (ref 12.0–15.0)
MCH: 30.3 pg (ref 26.0–34.0)
MCHC: 32.5 g/dL (ref 30.0–36.0)

## 2011-04-03 LAB — BASIC METABOLIC PANEL
BUN: 12 mg/dL (ref 6–23)
Calcium: 9.4 mg/dL (ref 8.4–10.5)
GFR calc non Af Amer: 90 mL/min — ABNORMAL LOW (ref >90)
Glucose, Bld: 95 mg/dL (ref 70–99)
Sodium: 137 mEq/L (ref 135–145)

## 2011-04-03 MED ORDER — ONDANSETRON HCL 4 MG PO TABS: 8.0000 mg | ORAL_TABLET | Freq: Three times a day (TID) | ORAL | Status: AC | PRN

## 2011-04-03 MED ORDER — ONDANSETRON HCL 4 MG/2ML IJ SOLN
4.0000 mg | Freq: Once | INTRAMUSCULAR | Status: AC
  Administered 2011-04-03: 4 mg via INTRAVENOUS
  Filled 2011-04-03: qty 2

## 2011-04-03 MED ORDER — SODIUM CHLORIDE 0.9 % IV BOLUS (SEPSIS)
1000.0000 mL | Freq: Once | INTRAVENOUS | Status: AC
  Administered 2011-04-03: 1000 mL via INTRAVENOUS

## 2011-04-03 MED ORDER — MORPHINE SULFATE 4 MG/ML IJ SOLN
4.0000 mg | Freq: Once | INTRAMUSCULAR | Status: AC
  Administered 2011-04-03: 4 mg via INTRAVENOUS
  Filled 2011-04-03: qty 1

## 2011-04-03 NOTE — ED Provider Notes (Signed)
History     CSN: [id number]  Arrival date & time 04/03/11  1146   First MD Initiated Contact with Patient 04/03/11 1156      Chief Complaint  Patient presents with  . Nausea  . Fever  . Chills    Patient is a 26 y.o. female presenting with fever. The history is provided by the patient.  Fever Primary symptoms of the febrile illness include fever.   patient reports nausea vomiting and diarrhea for several days.  She also reports fever and chills x3 days.  She reports decreased by mouth intake and generalized weakness.  Reported diarrhea has been without blood.  Denies melena.  She denies dysuria or urinary frequency.  Does complain of sore throat but she denies myalgias.  She reports pain with swallowing.  She denies abdominal pain at this time.  Past Medical History  Diagnosis Date  . 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 Brain tumor   . Kidney stones   . Schizoaffective disorder   . Anxiety   . Seizures   . Hypertension   . Depression   . Cancer 1991    ovarian    Past Surgical History  Procedure Date  . Abdominal hysterectomy     partial  . Kidney stone removal   . Appendectomy     Family History  Problem Relation Age of Onset  . Diabetes type II Other     History  Substance Use Topics  . Smoking status: Current Everyday Smoker -- 1.0 packs/day  . Smokeless tobacco: Not on file  . Alcohol Use: No    OB History    Grav Para Term Preterm Abortions TAB SAB Ect Mult Living                  Review of Systems  Constitutional: Positive for fever.  All other systems reviewed and are negative.    Allergies  Lortab; Toradol; and Tramadol  Home Medications   Current Outpatient Rx  Name Route Sig Dispense Refill  . CLONAZEPAM 1 MG PO TABS Oral Take 1 mg by mouth 3 (three) times daily. For anxiety    . HALOPERIDOL DECANOATE 100 MG/ML IM SOLN Intramuscular Inject 150 mg into the muscle every 28 (twenty-eight) days. Last dose given 02/21/2011.     . IBUPROFEN 200 MG PO TABS Oral Take 400 mg by mouth every 6 (six) hours as needed. For pain    . SERTRALINE HCL 50 MG PO TABS Oral Take 1 tablet (50 mg total) by mouth daily. For depression 30 tablet 0  . TRAZODONE HCL 100 MG PO TABS Oral Take 1 tablet (100 mg total) by mouth at bedtime. For depression 30 tablet 0  . ONDANSETRON HCL 4 MG PO TABS Oral Take 2 tablets (8 mg total) by mouth every 8 (eight) hours as needed for nausea. 12 tablet 0  . OXYCODONE-ACETAMINOPHEN 5-325 MG PO TABS Oral Take 1-2 tablets by mouth every 6 (six) hours as needed for pain. 8 tablet 0    BP 137/73  Pulse 71  Temp(Src) 98.7 F (37.1 C) (Oral)  Resp 18  Ht 5\' 5"  (1.651 m)  Wt 276 lb (125.193 kg)  BMI 45.93 kg/m2  SpO2 98%  Physical Exam  Nursing note and vitals reviewed. Constitutional: She is oriented to person, place, and time. She appears well-developed and well-nourished. No distress.  HENT:  Head: Normocephalic and atraumatic.  Eyes: EOM are normal.  Neck: Normal range of motion.  Cardiovascular: Normal rate, regular rhythm and normal heart sounds.   Pulmonary/Chest: Effort normal and breath sounds normal.       Obese  Abdominal: Soft. She exhibits no distension. There is no tenderness.  Musculoskeletal: Normal range of motion.  Neurological: She is alert and oriented to person, place, and time.  Skin: Skin is warm and dry.  Psychiatric: She has a normal mood and affect. Judgment normal.    ED Course  Procedures (including critical care time)  Labs Reviewed  URINALYSIS, ROUTINE W REFLEX MICROSCOPIC - Abnormal; Notable for the following:    APPearance CLOUDY (*)    Hgb urine dipstick TRACE (*)    All other components within normal limits  CBC - Abnormal; Notable for the following:    RBC 3.80 (*)    Hemoglobin 11.5 (*)    HCT 35.4 (*)    All other components within normal limits  BASIC METABOLIC PANEL - Abnormal; Notable for the following:    GFR calc non Af Amer 90 (*)    All other  components within normal limits  URINE MICROSCOPIC-ADD ON - Abnormal; Notable for the following:    Squamous Epithelial / LPF MANY (*)    Bacteria, UA FEW (*)    All other components within normal limits  PREGNANCY, URINE  RAPID STREP SCREEN   No results found.   1. Nausea and vomiting       MDM  The patient is feeling much better at this time.  She's been hydrated.  Her vital signs are normal.  Her labs and urinalysis are normal.  The patient will be discharged home with antiemetics        Lyanne Co, MD 04/03/11 [phone]

## 2011-04-03 NOTE — ED Notes (Signed)
Per ems, pt reports n/v/d, fever, and chills x 3 days.  Pt reports the inability to keep any food/fluids down since last Thursday.

## 2011-04-03 NOTE — ED Notes (Signed)
Pt reports ha coming back, notified edp
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
## 2011-04-04 ENCOUNTER — Encounter (HOSPITAL_COMMUNITY): Payer: Self-pay | Admitting: *Deleted

## 2011-04-04 ENCOUNTER — Emergency Department (HOSPITAL_COMMUNITY)
Admission: EM | Admit: 2011-04-04 | Discharge: 2011-04-04 | Disposition: A | Discharge status: Home / Self Care | Payer: Medicaid Other | Attending: Emergency Medicine | Admitting: Emergency Medicine

## 2011-04-04 DIAGNOSIS — R109 Unspecified abdominal pain: Secondary | ICD-10-CM | POA: Insufficient documentation

## 2011-04-04 DIAGNOSIS — F17200 Nicotine dependence, unspecified, uncomplicated: Secondary | ICD-10-CM | POA: Insufficient documentation

## 2011-04-04 DIAGNOSIS — R059 Cough, unspecified: Secondary | ICD-10-CM | POA: Insufficient documentation

## 2011-04-04 DIAGNOSIS — J3489 Other specified disorders of nose and nasal sinuses: Secondary | ICD-10-CM | POA: Insufficient documentation

## 2011-04-04 DIAGNOSIS — R062 Wheezing: Secondary | ICD-10-CM | POA: Insufficient documentation

## 2011-04-04 DIAGNOSIS — R0602 Shortness of breath: Secondary | ICD-10-CM | POA: Insufficient documentation

## 2011-04-04 DIAGNOSIS — Z79899 Other long term (current) drug therapy: Secondary | ICD-10-CM | POA: Insufficient documentation

## 2011-04-04 DIAGNOSIS — R079 Chest pain, unspecified: Secondary | ICD-10-CM | POA: Insufficient documentation

## 2011-04-04 DIAGNOSIS — R070 Pain in throat: Secondary | ICD-10-CM | POA: Insufficient documentation

## 2011-04-04 DIAGNOSIS — R05 Cough: Secondary | ICD-10-CM | POA: Insufficient documentation

## 2011-04-04 DIAGNOSIS — F341 Dysthymic disorder: Secondary | ICD-10-CM | POA: Insufficient documentation

## 2011-04-04 DIAGNOSIS — I10 Essential (primary) hypertension: Secondary | ICD-10-CM | POA: Insufficient documentation

## 2011-04-04 DIAGNOSIS — G40909 Epilepsy, unspecified, not intractable, without status epilepticus: Secondary | ICD-10-CM | POA: Insufficient documentation

## 2011-04-04 DIAGNOSIS — R1110 Vomiting, unspecified: Secondary | ICD-10-CM | POA: Insufficient documentation

## 2011-04-04 DIAGNOSIS — R10817 Generalized abdominal tenderness: Secondary | ICD-10-CM | POA: Insufficient documentation

## 2011-04-04 DIAGNOSIS — J40 Bronchitis, not specified as acute or chronic: Secondary | ICD-10-CM

## 2011-04-04 MED ORDER — ALBUTEROL SULFATE HFA 108 (90 BASE) MCG/ACT IN AERS
INHALATION_SPRAY | RESPIRATORY_TRACT | Status: AC
  Filled 2011-04-04: qty 6.7

## 2011-04-04 MED ORDER — TRAZODONE HCL 100 MG PO TABS
100.0000 mg | ORAL_TABLET | Freq: Every day | ORAL | Status: DC
  Administered 2011-04-04: 100 mg via ORAL
  Filled 2011-04-04: qty 1

## 2011-04-04 MED ORDER — AZITHROMYCIN 250 MG PO TABS: 500.0000 mg | ORAL_TABLET | Freq: Once | ORAL | Status: AC

## 2011-04-04 MED ORDER — CLONAZEPAM 0.5 MG PO TABS
1.0000 mg | ORAL_TABLET | Freq: Three times a day (TID) | ORAL | Status: DC | PRN
  Administered 2011-04-04: 1 mg via ORAL
  Filled 2011-04-04: qty 2

## 2011-04-04 MED ORDER — ACETAMINOPHEN-CODEINE 120-12 MG/5ML PO SOLN
10.0000 mL | Freq: Once | ORAL | Status: AC
  Administered 2011-04-04: 10 mL via ORAL
  Filled 2011-04-04: qty 10

## 2011-04-04 MED ORDER — ACETAMINOPHEN-CODEINE 120-12 MG/5ML PO SOLN: 10.0000 mL | Freq: Once | ORAL | Status: AC

## 2011-04-04 MED ORDER — ALBUTEROL SULFATE (5 MG/ML) 0.5% IN NEBU
INHALATION_SOLUTION | RESPIRATORY_TRACT | Status: AC
  Filled 2011-04-04: qty 1

## 2011-04-04 MED ORDER — ALBUTEROL SULFATE HFA 108 (90 BASE) MCG/ACT IN AERS: 2.0000 | INHALATION_SPRAY | Freq: Once | RESPIRATORY_TRACT | Status: DC

## 2011-04-04 MED ORDER — ALBUTEROL (5 MG/ML) CONTINUOUS INHALATION SOLN
5.0000 mg/h | INHALATION_SOLUTION | Freq: Once | RESPIRATORY_TRACT | Status: AC
  Administered 2011-04-04: 5 mg/h via RESPIRATORY_TRACT

## 2011-04-04 MED ORDER — ONDANSETRON HCL 4 MG/2ML IJ SOLN
INTRAMUSCULAR | Status: AC
  Administered 2011-04-04: 4 mg
  Filled 2011-04-04: qty 2

## 2011-04-04 NOTE — ED Notes (Signed)
Pt reports nausea, vomiting, SOB, and headache for past week. States she was just here in the ER last night for the same complaints. Pt thinks she has bronchitis, but did not get a chest xray last night and would like one tonight.
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
## 2011-04-04 NOTE — ED Notes (Signed)
Pt. Ambulated to restroom and provided UA sample

## 2011-04-05 NOTE — ED Provider Notes (Signed)
Medical screening examination/treatment/procedure(s) were performed by non-physician practitioner and as supervising physician I was immediately available for consultation/collaboration.   Gwyneth Sprout, MD 04/05/11 [phone]

## 2011-04-18 ENCOUNTER — Encounter (HOSPITAL_COMMUNITY): Payer: Self-pay | Admitting: Emergency Medicine

## 2011-04-18 ENCOUNTER — Emergency Department (HOSPITAL_COMMUNITY): Payer: Medicaid Other

## 2011-04-18 ENCOUNTER — Emergency Department (HOSPITAL_COMMUNITY)
Admission: EM | Admit: 2011-04-18 | Discharge: 2011-04-18 | Disposition: A | Discharge status: Home Health | Payer: Medicaid Other | Attending: Emergency Medicine | Admitting: Emergency Medicine

## 2011-04-18 DIAGNOSIS — Z8543 Personal history of malignant neoplasm of ovary: Secondary | ICD-10-CM | POA: Insufficient documentation

## 2011-04-18 DIAGNOSIS — I10 Essential (primary) hypertension: Secondary | ICD-10-CM | POA: Insufficient documentation

## 2011-04-18 DIAGNOSIS — F329 Major depressive disorder, single episode, unspecified: Secondary | ICD-10-CM | POA: Insufficient documentation

## 2011-04-18 DIAGNOSIS — F411 Generalized anxiety disorder: Secondary | ICD-10-CM | POA: Insufficient documentation

## 2011-04-18 DIAGNOSIS — R51 Headache: Secondary | ICD-10-CM | POA: Insufficient documentation

## 2011-04-18 DIAGNOSIS — G40909 Epilepsy, unspecified, not intractable, without status epilepticus: Secondary | ICD-10-CM | POA: Insufficient documentation

## 2011-04-18 DIAGNOSIS — Z79899 Other long term (current) drug therapy: Secondary | ICD-10-CM | POA: Insufficient documentation

## 2011-04-18 DIAGNOSIS — R109 Unspecified abdominal pain: Secondary | ICD-10-CM | POA: Insufficient documentation

## 2011-04-18 DIAGNOSIS — R319 Hematuria, unspecified: Secondary | ICD-10-CM | POA: Insufficient documentation

## 2011-04-18 DIAGNOSIS — F17200 Nicotine dependence, unspecified, uncomplicated: Secondary | ICD-10-CM | POA: Insufficient documentation

## 2011-04-18 DIAGNOSIS — F3289 Other specified depressive episodes: Secondary | ICD-10-CM | POA: Insufficient documentation

## 2011-04-18 LAB — BASIC METABOLIC PANEL
BUN: 11 mg/dL (ref 6–23)
CO2: 27 mEq/L (ref 19–32)
Chloride: 104 mEq/L (ref 96–112)
Creatinine, Ser: 0.91 mg/dL (ref 0.50–1.10)
Glucose, Bld: 97 mg/dL (ref 70–99)

## 2011-04-18 LAB — CBC
HCT: 36.6 % (ref 36.0–46.0)
Hemoglobin: 11.8 g/dL — ABNORMAL LOW (ref 12.0–15.0)
MCHC: 32.2 g/dL (ref 30.0–36.0)

## 2011-04-18 LAB — DIFFERENTIAL
Basophils Relative: 0 % (ref 0–1)
Lymphocytes Relative: 30 % (ref 12–46)
Lymphs Abs: 2.4 10*3/uL (ref 0.7–4.0)
Monocytes Absolute: 0.4 10*3/uL (ref 0.1–1.0)
Monocytes Relative: 4 % (ref 3–12)
Neutro Abs: 5.2 10*3/uL (ref 1.7–7.7)

## 2011-04-18 LAB — URINALYSIS, ROUTINE W REFLEX MICROSCOPIC
Bilirubin Urine: NEGATIVE
Glucose, UA: NEGATIVE mg/dL
pH: 6.5 (ref 5.0–8.0)

## 2011-04-18 LAB — URINE MICROSCOPIC-ADD ON

## 2011-04-18 MED ORDER — FENTANYL CITRATE 0.05 MG/ML IJ SOLN
100.0000 ug | Freq: Once | INTRAMUSCULAR | Status: AC
  Administered 2011-04-18: 100 ug via INTRAMUSCULAR
  Filled 2011-04-18: qty 2

## 2011-04-18 MED ORDER — IBUPROFEN 800 MG PO TABS: 800.0000 mg | ORAL_TABLET | Freq: Three times a day (TID) | ORAL | Status: AC

## 2011-04-18 MED ORDER — ONDANSETRON 4 MG PO TBDP
4.0000 mg | ORAL_TABLET | Freq: Once | ORAL | Status: AC
  Administered 2011-04-18: 4 mg via ORAL
  Filled 2011-04-18: qty 1

## 2011-04-18 MED ORDER — ACETAMINOPHEN-CODEINE #3 300-30 MG PO TABS
1.0000 | ORAL_TABLET | Freq: Four times a day (QID) | ORAL | Status: AC | PRN
Start: 2011-04-18 — End: 2011-04-28
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
## 2011-04-18 NOTE — Discharge Instructions (Signed)
Call [facility] today to schedule close follow up for recheck of flank pain and blood in your urine. Alternate between tylenol #3 and ibuprofen for pain. Return to ER for emergent changing or worsening of symptoms. Call Neurologist for recheck of headaches.   Flank Pain Flank pain refers to pain that is located on the side of the body between the upper abdomen and the back. It can be caused by many things. CAUSES  Some of the more common causes of flank pain include:  Muscle strain.   Muscle spasms.   A disease of your spine (vertebral disk disease).   A lung infection (pneumonia).   Fluid around your lungs (pulmonary edema).   A kidney infection.   Kidney stones.   A very painful skin rash on only one side of your body (shingles).   Gallbladder disease.  DIAGNOSIS  Blood tests, urine tests, and X-rays may help your caregiver determine what is wrong. TREATMENT  The treatment of pain depends on the cause. Your caregiver will determine what treatment will work best for you. HOME CARE INSTRUCTIONS   Home care will depend on the cause of your pain.   Some medications may help relieve the pain. Take medication for relief of pain as directed by your caregiver.   Tell your caregiver about any changes in your pain.   Follow up with your caregiver.  SEEK IMMEDIATE MEDICAL CARE IF:   Your pain is not controlled with medication.   The pain increases.   You have abdominal pain.   You have shortness of breath.   You have persistent nausea or vomiting.   You have swelling in your abdomen.   You feel faint or pass out.   You have a temperature by mouth above 102 F (38.9 C), not controlled by medicine.  MAKE SURE YOU:   Understand these instructions.   Will watch your condition.   Will get help right away if you are not doing well or get worse.  Document Released: 04/05/2005 Document Revised: 10/25/2010 Document Reviewed: 07/30/2009 [facility] Patient Information  2012 [place], [place].

## 2011-04-18 NOTE — ED Notes (Signed)
Patient transported to CT 

## 2011-04-18 NOTE — ED Notes (Signed)
Multiple c/o's . Flank pain and dark urine is her primary complaint today.
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
## 2011-04-18 NOTE — ED Notes (Signed)
Patient transported to CT. Will obtain vs when pt returns.

## 2011-04-18 NOTE — ED Provider Notes (Signed)
Medical screening examination/treatment/procedure(s) were performed by non-physician practitioner and as supervising physician I was immediately available for consultation/collaboration.   Laray Anger, DO 04/18/11 1823

## 2011-05-02 ENCOUNTER — Emergency Department (HOSPITAL_COMMUNITY): Payer: Medicaid Other

## 2011-05-02 ENCOUNTER — Emergency Department (HOSPITAL_COMMUNITY)
Admission: EM | Admit: 2011-05-02 | Discharge: 2011-05-02 | Disposition: A | Discharge status: Home Health | Payer: Medicaid Other | Attending: Emergency Medicine | Admitting: Emergency Medicine

## 2011-05-02 DIAGNOSIS — R112 Nausea with vomiting, unspecified: Secondary | ICD-10-CM | POA: Insufficient documentation

## 2011-05-02 DIAGNOSIS — F259 Schizoaffective disorder, unspecified: Secondary | ICD-10-CM | POA: Insufficient documentation

## 2011-05-02 DIAGNOSIS — R319 Hematuria, unspecified: Secondary | ICD-10-CM | POA: Insufficient documentation

## 2011-05-02 DIAGNOSIS — I10 Essential (primary) hypertension: Secondary | ICD-10-CM | POA: Insufficient documentation

## 2011-05-02 DIAGNOSIS — G8929 Other chronic pain: Secondary | ICD-10-CM | POA: Insufficient documentation

## 2011-05-02 DIAGNOSIS — R109 Unspecified abdominal pain: Secondary | ICD-10-CM | POA: Insufficient documentation

## 2011-05-02 DIAGNOSIS — N200 Calculus of kidney: Secondary | ICD-10-CM | POA: Insufficient documentation

## 2011-05-02 DIAGNOSIS — G40909 Epilepsy, unspecified, not intractable, without status epilepticus: Secondary | ICD-10-CM | POA: Insufficient documentation

## 2011-05-02 LAB — URINALYSIS, ROUTINE W REFLEX MICROSCOPIC
Ketones, ur: NEGATIVE mg/dL
Protein, ur: 30 mg/dL — AB
Urobilinogen, UA: 1 mg/dL (ref 0.0–1.0)

## 2011-05-02 LAB — URINE MICROSCOPIC-ADD ON

## 2011-05-02 MED ORDER — IBUPROFEN 800 MG PO TABS
800.0000 mg | ORAL_TABLET | Freq: Once | ORAL | Status: AC
  Administered 2011-05-02: 800 mg via ORAL
  Filled 2011-05-02: qty 1

## 2011-05-02 MED ORDER — LORAZEPAM 2 MG/ML IJ SOLN
1.0000 mg | Freq: Once | INTRAMUSCULAR | Status: AC
  Administered 2011-05-02: 1 mg via INTRAVENOUS
  Filled 2011-05-02: qty 1

## 2011-05-02 NOTE — ED Notes (Signed)
Pt alert, nad, c/o left flank pain, onset was several days ago, per pt, hx of 6mm stone, resp even unlabored, pt states "lost prescription"

## 2011-05-02 NOTE — Discharge Instructions (Signed)
You were seen and evaluated for your complaints of left flank pains. Your x-rays have not shown any change of your kidney stone which is located in your kidney. Kidney stones in the kidney and normally do not cause pain. Please continue to followup with your urology specialist for your symptoms.   Hematuria, Adult Hematuria (blood in your urine) can be caused by a bladder infection (cystitis), kidney infection (pyelonephritis), prostate infection (prostatitis), or kidney stone. Infections will usually respond to antibiotics (medications which kill germs), and a kidney stone will usually pass through your urine without further treatment. If you were put on antibiotics, take all the medicine until gone. You may feel better in a few days, but take all of your medicine or the infection may not respond and become more difficult to treat. If antibiotics were not given, an infection did not cause the blood in the urine. A further work up to find out the reason may be needed. HOME CARE INSTRUCTIONS   Drink lots of fluid, 3 to 4 quarts a day. If you have been diagnosed with an infection, cranberry juice is especially recommended, in addition to large amounts of water.   Avoid caffeine, tea, and carbonated beverages, because they tend to irritate the bladder.   Avoid alcohol as it may irritate the prostate.   Only take over-the-counter or prescription medicines for pain, discomfort, or fever as directed by your caregiver.   If you have been diagnosed with a kidney stone follow your caregivers instructions regarding straining your urine to catch the stone.  TO PREVENT FURTHER INFECTIONS:  Empty the bladder often. Avoid holding urine for long periods of time.   After a bowel movement, women should cleanse front to back. Use each tissue only once.   Empty the bladder before and after sexual intercourse if you are a female.   Return to your caregiver if you develop back pain, fever, nausea (feeling sick to  your stomach), vomiting, or your symptoms (problems) are not better in 3 days. Return sooner if you are getting worse.  If you have been requested to return for further testing make sure to keep your appointments. If an infection is not the cause of blood in your urine, X-rays may be required. Your caregiver will discuss this with you. SEEK IMMEDIATE MEDICAL CARE IF:   You have a persistent fever over 102 F (38.9 C).   You develop severe vomiting and are unable to keep the medication down.   You develop severe back or abdominal pain despite taking your medications.   You begin passing a large amount of blood or clots in your urine.   You feel extremely weak or faint, or pass out.  MAKE SURE YOU:   Understand these instructions.   Will watch your condition.   Will get help right away if you are not doing well or get worse.  Document Released: 02/12/2005 Document Revised: 02/01/2011 Document Reviewed: 10/02/2007 [facility] Patient Information 2012 [place], [place].    Flank Pain Flank pain refers to pain that is located on the side of the body between the upper abdomen and the back. It can be caused by many things. CAUSES  Some of the more common causes of flank pain include:  Muscle strain.   Muscle spasms.   A disease of your spine (vertebral disk disease).   A lung infection (pneumonia).   Fluid around your lungs (pulmonary edema).   A kidney infection.   Kidney stones.   A very painful  skin rash on only one side of your body (shingles).   Gallbladder disease.  DIAGNOSIS  Blood tests, urine tests, and X-rays may help your caregiver determine what is wrong. TREATMENT  The treatment of pain depends on the cause. Your caregiver will determine what treatment will work best for you. HOME CARE INSTRUCTIONS   Home care will depend on the cause of your pain.   Some medications may help relieve the pain. Take medication for relief of pain as directed by your caregiver.     Tell your caregiver about any changes in your pain.   Follow up with your caregiver.  SEEK IMMEDIATE MEDICAL CARE IF:   Your pain is not controlled with medication.   The pain increases.   You have abdominal pain.   You have shortness of breath.   You have persistent nausea or vomiting.   You have swelling in your abdomen.   You feel faint or pass out.   You have a temperature by mouth above 102 F (38.9 C), not controlled by medicine.  MAKE SURE YOU:   Understand these instructions.   Will watch your condition.   Will get help right away if you are not doing well or get worse.  Document Released: 04/05/2005 Document Revised: 02/01/2011 Document Reviewed: 07/30/2009 [facility] Patient Information 2012 [place], [place].    Chronic Pain Management Managing chronic pain is not easy. The goal is to provide as much pain relief as possible. There are emotional as well as physical problems. Chronic pain may lead to symptoms of depression which magnify those of the pain. Problems may include:  Anxiety.   Sleep disturbances.   Confused thinking.   Feeling cranky.   Fatigue.   Weight gain or loss.  Identify the source of the pain first, if possible. The pain may be masking another problem. Try to find a pain management specialist or clinic. Work with a team to create a treatment plan for you. MEDICATIONS  May include narcotics or opioids. Larger than normal doses may be needed to control your pain.   Drugs for depression may help.   Over-the-counter medicines may help for some conditions. These drugs may be used along with others for better pain relief.   May be injected into sites such as the spine and joints. Injections may have to be repeated if they wear off.  THERAPY MAY INCLUDE:  Working with a physical therapist to keep from getting stiff.   Regular, gentle exercise.   Cognitive or behavioral therapy.   Using complementary or integrative medicine such  as:   Acupuncture.   Massage, Reiki, or Rolfing.   Aroma, color, light, or sound therapy.   Group support.  FOR MORE INFORMATION [email or web address]. American Chronic Pain Association [email or web address]. Document Released: 03/22/2004 Document Revised: 02/01/2011 Document Reviewed: 05/01/2007 [facility] Patient Information 2012 [place], [place].

## 2011-05-02 NOTE — ED Notes (Signed)
Pt walking past nurse's station in hall. Asked pt where she was going and she stated "No one is taking care of me and I'm leaving. " Explained to pt that I must take her IV hep lock out prior to her leaving the dept., and pt just kept walking past and began to scream. Charge Nurse, Terri arrived and security called. Pt screaming and cursing at staff. Pt eventually escorted back to room by security. PA, Dammen and Dr. Nicanor Alcon aware.

## 2011-05-02 NOTE — ED Notes (Signed)
Pt refusing to have her blood drawn at this time. Pt screaming and crying "It hurts!". Pt uncooperative. Attempted to explain rational for blood draw, but pt just continues to scream "It hurts!".

## 2011-05-02 NOTE — ED Provider Notes (Signed)
History     CSN: [id number]  Arrival date & time 05/02/11  0230   First MD Initiated Contact with Patient 05/02/11 [phone]      Chief Complaint  Patient presents with  . Flank Pain     HPI  History provided by the patient. Patient is a 26 year old female with history of anxiety, depression, seizures, schizoaffective disorder and kidney stones who presents with complaints of left flank pain. Patient has multiple visits to the emergency room for similar symptoms. Patient states she's had increasing left flank pain over the weekend. Patient states that she was seen by her urologist as Friday for known kidney stone in her left kidney. He states that since that time she has been having increasing pain. She's had some associated nausea and vomiting. She denies any fever, chills, sweats. Patient was using pain medications she received the emergency room states that she has run out and that they were not helping significantly. Patient denies any other aggravating or alleviating factors.    Past Medical History  Diagnosis Date  . 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 Brain tumor   . Schizoaffective disorder   . Anxiety   . Seizures   . Hypertension   . Depression   . Cancer 1991    ovarian  . Kidney stones     Past Surgical History  Procedure Date  . Abdominal hysterectomy     partial  . Kidney stone removal   . Appendectomy     Family History  Problem Relation Age of Onset  . Diabetes type II Other     History  Substance Use Topics  . Smoking status: Current Everyday Smoker -- 1.0 packs/day  . Smokeless tobacco: Not on file  . Alcohol Use: No    OB History    Grav Para Term Preterm Abortions TAB SAB Ect Mult Living                  Review of Systems  Constitutional: Negative for fever and chills.  Cardiovascular: Negative for chest pain.  Gastrointestinal: Positive for nausea, vomiting and diarrhea. Negative for abdominal pain and constipation.    Genitourinary: Positive for flank pain. Negative for dysuria and frequency.  Musculoskeletal: Positive for back pain.  All other systems reviewed and are negative.    Allergies  Lortab; Toradol; and Tramadol  Home Medications   Current Outpatient Rx  Name Route Sig Dispense Refill  . CLONAZEPAM 1 MG PO TABS Oral Take 1 mg by mouth 3 (three) times daily. For anxiety    . HALOPERIDOL DECANOATE 100 MG/ML IM SOLN Intramuscular Inject 150 mg into the muscle every 28 (twenty-eight) days. Last dose given 03/30/2011    . SERTRALINE HCL 50 MG PO TABS Oral Take 1 tablet (50 mg total) by mouth daily. For depression 30 tablet 0  . TRAZODONE HCL 100 MG PO TABS Oral Take 1 tablet (100 mg total) by mouth at bedtime. For depression 30 tablet 0    BP 129/74  Pulse 88  Temp(Src) 98.4 F (36.9 C) (Oral)  Resp 20  SpO2 93%  Physical Exam  Nursing note and vitals reviewed. Constitutional: She is oriented to person, place, and time. She appears well-developed and well-nourished. No distress.  HENT:  Head: Normocephalic.  Cardiovascular: Normal rate and regular rhythm.   Pulmonary/Chest: Effort normal and breath sounds normal. No respiratory distress. She has no wheezes. She has no rales.  Abdominal: Soft. She exhibits no distension. There  is CVA tenderness. There is no rebound and no guarding.       Morbidly obese  Neurological: She is alert and oriented to person, place, and time.  Skin: Skin is warm and dry. No rash noted.  Psychiatric: She has a normal mood and affect. Her behavior is normal.    ED Course  Procedures   Results for orders placed during the hospital encounter of 05/02/11  URINALYSIS, ROUTINE W REFLEX MICROSCOPIC      Component Value Range   Color, Urine YELLOW  YELLOW    APPearance CLOUDY (*) CLEAR    Specific Gravity, Urine 1.025  1.005 - 1.030    pH 6.0  5.0 - 8.0    Glucose, UA NEGATIVE  NEGATIVE (mg/dL)   Hgb urine dipstick LARGE (*) NEGATIVE    Bilirubin Urine  NEGATIVE  NEGATIVE    Ketones, ur NEGATIVE  NEGATIVE (mg/dL)   Protein, ur 30 (*) NEGATIVE (mg/dL)   Urobilinogen, UA 1.0  0.0 - 1.0 (mg/dL)   Nitrite NEGATIVE  NEGATIVE    Leukocytes, UA SMALL (*) NEGATIVE   URINE MICROSCOPIC-ADD ON      Component Value Range   Squamous Epithelial / LPF RARE  RARE    WBC, UA 3-6  <3 (WBC/hpf)   RBC / HPF 11-20  <3 (RBC/hpf)   Bacteria, UA MANY (*) RARE    Crystals CA OXALATE CRYSTALS (*) NEGATIVE       Dg Abd 1 View  05/02/2011  *RADIOLOGY REPORT*  Clinical Data: Left flank pain, left renal calculus  ABDOMEN - 1 VIEW  Comparison: 04/27/2011  Findings: 7 x 5 mm diameter calculus projects over left kidney, unchanged. No ureteral calcification. Bowel gas pattern normal. No acute osseous findings.  IMPRESSION: 7 x 5 mm left renal calculus.  Original Report Authenticated By: Lollie Marrow, M.D.     1. Chronic pain   2. Flank pain   3. Hematuria       MDM  Pt seen and evaluated. Patient in no acute distress.   Patient is very dramatic about her pain symptoms. Her x-ray today did not show any change in the position of her kidney stone within the left kidney. I explained to patient there was no signs to explain her pains at this time. She became very upset and states that her pain was not treated well enough. After explaining to is not receiving any more pain medicine patient then became calm and did not appear in any significant pain or distress upon discharge. Patient was instructed to followup with her urology specialist.              Angus Seller, PA 05/02/11 [phone]

## 2011-05-02 NOTE — ED Notes (Signed)
[id number]<AV> Expected date:<BR> Expected time:<BR> Means of arrival:<BR> Comments:<BR> EMS/kidney stone-IV-Fentanyl 100 mcgs/Zofran per EMS enroute

## 2011-05-02 NOTE — ED Notes (Signed)
PA, Dammen at bedside. 

## 2011-05-02 NOTE — ED Notes (Signed)
Pt medicated with Fentanyl by EMS pta.

## 2011-05-02 NOTE — ED Notes (Signed)
Lab tech at bedside for blood draw.

## 2011-05-02 NOTE — ED Notes (Signed)
Pt provided fentanyl IVP, intranasal, 4mg  Zofran IVP pta

## 2011-05-02 NOTE — ED Notes (Signed)
Pt diagnosed with kidney stone last week in [facility] ED.

## 2011-05-02 NOTE — ED Notes (Signed)
Patient transported to X-ray 

## 2011-05-02 NOTE — ED Provider Notes (Signed)
Medical screening examination/treatment/procedure(s) were performed by non-physician practitioner and as supervising physician I was immediately available for consultation/collaboration.  Jasmine Awe, MD 05/02/11 [phone]

## 2011-05-03 ENCOUNTER — Encounter (HOSPITAL_COMMUNITY): Payer: Self-pay | Admitting: *Deleted

## 2011-05-03 ENCOUNTER — Other Ambulatory Visit: Payer: Self-pay | Admitting: Urology

## 2011-05-03 NOTE — Progress Notes (Signed)
Pt. Scheduled for lithotripsy on Monday 3/11. Spoke to patient on phone and explained pre-litho instructions. Patient has blue folder from urologists office. Instructed patient to fill out and bring with her on Monday. Instructed on no aspirin or aspirin products and to take the laxative as ordered by her physician. Stressed she needs to have someone drive her home. Sonda Rumble [phone] will be with her and take her home.

## 2011-05-06 NOTE — H&P (Signed)
Urology History and Physical Exam  CC: Left UPJ stone  HPI:         26 year old female presented to clinic with left UPJ stones. This was causing nausea and pain. CT scan 04/18/11 showed a left 6 mm lower pole nonobstructing stone, but a follow up KUB 05/03/11 showed the stone to be more medial and possibly at the left UPJ. Her UA 05/04/11 showed no leukocyte esterase or nitrites and bacteria. We discussed management options and she elected for left SWL.  We have discussed risks, benefits, alternatives, and likelihood of achieving her goals.   PMH: Past Medical History  Diagnosis Date  . 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 Brain tumor   . Schizoaffective disorder   . Anxiety   . Seizures   . Hypertension   . Depression   . Cancer 1991    ovarian  . Kidney stones     PSH: Past Surgical History  Procedure Date  . Abdominal hysterectomy     partial  . Kidney stone removal   . Appendectomy     Allergies: Allergies  Allergen Reactions  . Lortab Hives  . Toradol Hives  . Tramadol Hives    Medications: No prescriptions prior to admission     Social History: History   Social History  . Marital Status: Single    Spouse Name: N/A    Number of Children: N/A  . Years of Education: N/A   Occupational History  . Not on file.   Social History Main Topics  . Smoking status: Current Everyday Smoker -- 1.0 packs/day  . Smokeless tobacco: Not on file  . Alcohol Use: No  . Drug Use: 7 per week    Special: Marijuana  . Sexually Active: Yes    Birth Control/ Protection: Condom   Other Topics Concern  . Not on file   Social History Narrative  . No narrative on file    Family History: Family History  Problem Relation Age of Onset  . Diabetes type II Other     Review of Systems: Positive: Left flank pain. Negative: Chest pain, fever, SOB.  A further 10 point review of systems was negative except what is listed in the HPI.  Physical  Exam:  General: No acute distress.  Awake. Head:  Normocephalic.  Atraumatic. ENT:  EOMI.  Mucous membranes moist Neck:  Supple.  No lymphadenopathy. CV:  S1 present. S2 present. Regular rate. Pulmonary: Equal effort bilaterally.  Clear to auscultation bilaterally. Abdomen: Soft.  Non- tender to palpation. Skin:  Normal turgor.  No visible rash. Extremity: No gross deformity of bilateral upper extremities.  No gross deformity of    bilateral lower extremities. Neurologic: Alert. Appropriate mood.    Studies:  No results found for this basename: HGB:2,WBC:2,PLT:2 in the last 72 hours  No results found for this basename: NA:2,K:2,CL:2,CO2:2,BUN:2,CREATININE:2,CALCIUM:2,MAGNESIUM:2,GFRNONAA:2,GFRAA:2 in the last 72 hours   No results found for this basename: PT:2,INR:2,APTT:2 in the last 72 hours   No components found with this basename: ABG:2    Assessment:  Left UPJ stone  Plan: -Left shockwave lithotripsy of stone.

## 2011-05-07 ENCOUNTER — Ambulatory Visit (HOSPITAL_COMMUNITY): Payer: Medicaid Other

## 2011-05-07 ENCOUNTER — Encounter (HOSPITAL_COMMUNITY)
Admission: RE | Disposition: A | Discharge status: Home / Self Care | Payer: Self-pay | Source: Ambulatory Visit | Attending: Urology

## 2011-05-07 ENCOUNTER — Encounter (HOSPITAL_COMMUNITY): Payer: Self-pay | Admitting: *Deleted

## 2011-05-07 ENCOUNTER — Ambulatory Visit (HOSPITAL_COMMUNITY)
Admission: RE | Admit: 2011-05-07 | Discharge: 2011-05-07 | Disposition: A | Discharge status: Home / Self Care | Payer: Medicaid Other | Source: Ambulatory Visit | Attending: Urology | Admitting: Urology

## 2011-05-07 DIAGNOSIS — E669 Obesity, unspecified: Secondary | ICD-10-CM | POA: Insufficient documentation

## 2011-05-07 DIAGNOSIS — I10 Essential (primary) hypertension: Secondary | ICD-10-CM | POA: Insufficient documentation

## 2011-05-07 DIAGNOSIS — D496 Neoplasm of unspecified behavior of brain: Secondary | ICD-10-CM | POA: Insufficient documentation

## 2011-05-07 DIAGNOSIS — G40309 Generalized idiopathic epilepsy and epileptic syndromes, not intractable, without status epilepticus: Secondary | ICD-10-CM | POA: Insufficient documentation

## 2011-05-07 DIAGNOSIS — N200 Calculus of kidney: Secondary | ICD-10-CM | POA: Insufficient documentation

## 2011-05-07 DIAGNOSIS — Z8543 Personal history of malignant neoplasm of ovary: Secondary | ICD-10-CM | POA: Insufficient documentation

## 2011-05-07 SURGERY — LITHOTRIPSY, ESWL
Anesthesia: LOCAL | Laterality: Left

## 2011-05-07 MED ORDER — OXYCODONE-ACETAMINOPHEN 5-325 MG PO TABS
ORAL_TABLET | ORAL | Status: AC
  Administered 2011-05-07: 2 via ORAL
  Filled 2011-05-07: qty 2

## 2011-05-07 MED ORDER — CIPROFLOXACIN HCL 500 MG PO TABS
500.0000 mg | ORAL_TABLET | ORAL | Status: AC
  Administered 2011-05-07: 500 mg via ORAL

## 2011-05-07 MED ORDER — OXYCODONE-ACETAMINOPHEN 5-325 MG PO TABS
1.0000 | ORAL_TABLET | ORAL | Status: AC | PRN
Start: 2011-05-07 — End: 2011-05-17

## 2011-05-07 MED ORDER — OXYCODONE-ACETAMINOPHEN 5-325 MG PO TABS
1.0000 | ORAL_TABLET | ORAL | Status: AC | PRN
  Administered 2011-05-07: 2 via ORAL

## 2011-05-07 MED ORDER — DIAZEPAM 5 MG PO TABS
10.0000 mg | ORAL_TABLET | ORAL | Status: AC
  Administered 2011-05-07: 10 mg via ORAL

## 2011-05-07 MED ORDER — DIPHENHYDRAMINE HCL 25 MG PO CAPS
25.0000 mg | ORAL_CAPSULE | ORAL | Status: AC
  Administered 2011-05-07: 25 mg via ORAL

## 2011-05-07 MED ORDER — DEXTROSE-NACL 5-0.45 % IV SOLN
INTRAVENOUS | Status: DC
  Administered 2011-05-07: 10:00:00 via INTRAVENOUS

## 2011-05-07 NOTE — Progress Notes (Signed)
Left flank with reddened area with no breakdown status post lithotripsy.

## 2011-05-07 NOTE — Brief Op Note (Signed)
05/07/2011  11:34 AM  PATIENT:  [name]  26 y.o. female  PRE-OPERATIVE DIAGNOSIS:  left upj stone  POST-OPERATIVE DIAGNOSIS: Left UPJ stone  PROCEDURE:  Procedure(s) (LRB): EXTRACORPOREAL SHOCK WAVE LITHOTRIPSY (ESWL) (Left)  SURGEON:  Surgeon(s) and Role:    * Milford Cage, MD - Primary  PHYSICIAN ASSISTANT:   ASSISTANTS: none   ANESTHESIA:   IV sedation  EBL:     BLOOD ADMINISTERED:none  DRAINS: none   LOCAL MEDICATIONS USED:  NONE  SPECIMEN:  No Specimen  DISPOSITION OF SPECIMEN:  N/A  COUNTS:  YES  TOURNIQUET:  * No tourniquets in log *  DICTATION: .Note written in paper chart  PLAN OF CARE: Discharge to home after PACU  PATIENT DISPOSITION:  PACU - hemodynamically stable.   Delay start of Pharmacological VTE agent (>24hrs) due to surgical blood loss or risk of bleeding: yes

## 2011-05-07 NOTE — Progress Notes (Signed)
Faxed dates to pt's social worker when pt able to attend court. Confirmation received. Pt is ready for discharge at 1230; however, waiting for pt's sister to get out of work.

## 2011-05-07 NOTE — Progress Notes (Signed)
Pt assisted to front door via wheelchair where her sister picked her up via car.

## 2011-05-07 NOTE — Discharge Instructions (Signed)
DISCHARGE INSTRUCTIONS FOR KIDNEY STONES   MEDICATIONS:   1. DO NOT RESUME YOUR ASPIRIN, or any other medicines like ibuprofen, motrin, excedrin, advil, aleve, vitamin E, fish oil as these can all cause bleeding x 7 days.  2. Resume all your other meds from home - except do not take any other pain meds that you may have at home.  ACTIVITY 1. No strenuous activity x 1week 2. No driving while on narcotic pain medications 3. Drink plenty of water 4. Continue to walk at home - you can still get blood clots when you are at home, so keep active, but don't over do it. 5. May return to work in 3 days.  BATHING 1. You can shower or take a bath.  SIGNS/SYMPTOMS TO CALL: 1. Please call us if you have a fever greater than 101.5, uncontrolled  nausea/vomiting, uncontrolled pain, dizziness, unable to urinate, chest pain, shortness of breath, leg swelling, leg pain, redness around wound, drainage from wound, or any other concerns or questions.  You can reach [place] at [phone].  FOLLOW-UP 1. You should be able to attend court dates and other meetings by 05/10/11.

## 2011-05-08 ENCOUNTER — Encounter (HOSPITAL_COMMUNITY): Payer: Self-pay

## 2011-05-14 ENCOUNTER — Encounter (HOSPITAL_COMMUNITY): Payer: Self-pay | Admitting: *Deleted

## 2011-05-14 ENCOUNTER — Emergency Department (HOSPITAL_COMMUNITY)
Admission: EM | Admit: 2011-05-14 | Discharge: 2011-05-15 | Disposition: A | Discharge status: Home / Self Care | Payer: Medicaid Other | Attending: Emergency Medicine | Admitting: Emergency Medicine

## 2011-05-14 DIAGNOSIS — Z8543 Personal history of malignant neoplasm of ovary: Secondary | ICD-10-CM | POA: Insufficient documentation

## 2011-05-14 DIAGNOSIS — G40909 Epilepsy, unspecified, not intractable, without status epilepticus: Secondary | ICD-10-CM | POA: Insufficient documentation

## 2011-05-14 DIAGNOSIS — F319 Bipolar disorder, unspecified: Secondary | ICD-10-CM

## 2011-05-14 DIAGNOSIS — I10 Essential (primary) hypertension: Secondary | ICD-10-CM | POA: Insufficient documentation

## 2011-05-14 DIAGNOSIS — F329 Major depressive disorder, single episode, unspecified: Secondary | ICD-10-CM

## 2011-05-14 DIAGNOSIS — F17200 Nicotine dependence, unspecified, uncomplicated: Secondary | ICD-10-CM | POA: Insufficient documentation

## 2011-05-14 DIAGNOSIS — F411 Generalized anxiety disorder: Secondary | ICD-10-CM | POA: Insufficient documentation

## 2011-05-14 DIAGNOSIS — Z87442 Personal history of urinary calculi: Secondary | ICD-10-CM | POA: Insufficient documentation

## 2011-05-14 DIAGNOSIS — H5316 Psychophysical visual disturbances: Secondary | ICD-10-CM | POA: Insufficient documentation

## 2011-05-14 DIAGNOSIS — R443 Hallucinations, unspecified: Secondary | ICD-10-CM

## 2011-05-14 DIAGNOSIS — Z79899 Other long term (current) drug therapy: Secondary | ICD-10-CM | POA: Insufficient documentation

## 2011-05-14 DIAGNOSIS — F3130 Bipolar disorder, current episode depressed, mild or moderate severity, unspecified: Secondary | ICD-10-CM | POA: Insufficient documentation

## 2011-05-14 LAB — CBC
HCT: 37.4 % (ref 36.0–46.0)
MCH: 30.6 pg (ref 26.0–34.0)
MCHC: 32.6 g/dL (ref 30.0–36.0)
MCV: 93.7 fL (ref 78.0–100.0)
RDW: 13.1 % (ref 11.5–15.5)
WBC: 7.9 10*3/uL (ref 4.0–10.5)

## 2011-05-14 LAB — URINALYSIS, ROUTINE W REFLEX MICROSCOPIC
Ketones, ur: NEGATIVE mg/dL
Leukocytes, UA: NEGATIVE
Nitrite: NEGATIVE
Protein, ur: NEGATIVE mg/dL
pH: 7 (ref 5.0–8.0)

## 2011-05-14 LAB — COMPREHENSIVE METABOLIC PANEL
Albumin: 3.6 g/dL (ref 3.5–5.2)
BUN: 10 mg/dL (ref 6–23)
Chloride: 102 mEq/L (ref 96–112)
Creatinine, Ser: 0.86 mg/dL (ref 0.50–1.10)
GFR calc non Af Amer: >90 mL/min (ref >90)
Total Bilirubin: 0.2 mg/dL — ABNORMAL LOW (ref 0.3–1.2)

## 2011-05-14 LAB — PREGNANCY, URINE: Preg Test, Ur: NEGATIVE

## 2011-05-14 MED ORDER — LORAZEPAM 1 MG PO TABS
ORAL_TABLET | ORAL | Status: AC
  Administered 2011-05-14: 17:00:00
  Filled 2011-05-14: qty 1

## 2011-05-14 MED ORDER — TRAZODONE HCL 100 MG PO TABS: 100.0000 mg | ORAL_TABLET | Freq: Every day | ORAL | Status: DC

## 2011-05-14 MED ORDER — HALOPERIDOL DECANOATE 100 MG/ML IM SOLN
150.0000 mg | Freq: Once | INTRAMUSCULAR | Status: AC
  Administered 2011-05-15: 150 mg via INTRAMUSCULAR
  Filled 2011-05-14: qty 1.5

## 2011-05-14 MED ORDER — CLONAZEPAM 1 MG PO TABS
1.0000 mg | ORAL_TABLET | Freq: Three times a day (TID) | ORAL | Status: DC | PRN
  Administered 2011-05-14: 1 mg via ORAL
  Filled 2011-05-14: qty 1

## 2011-05-14 MED ORDER — ZOLPIDEM TARTRATE 5 MG PO TABS
5.0000 mg | ORAL_TABLET | Freq: Every evening | ORAL | Status: DC | PRN
  Administered 2011-05-15: 5 mg via ORAL
  Filled 2011-05-14: qty 1

## 2011-05-14 MED ORDER — SERTRALINE HCL 25 MG PO TABS: 25.0000 mg | ORAL_TABLET | Freq: Every day | ORAL | Status: DC

## 2011-05-14 MED ORDER — OXYCODONE-ACETAMINOPHEN 5-325 MG PO TABS
1.0000 | ORAL_TABLET | Freq: Four times a day (QID) | ORAL | Status: DC | PRN
  Administered 2011-05-14 (×2): 1 via ORAL
  Filled 2011-05-14 (×2): qty 1

## 2011-05-14 MED ORDER — ONDANSETRON HCL 4 MG PO TABS: 4.0000 mg | ORAL_TABLET | Freq: Three times a day (TID) | ORAL | Status: DC | PRN

## 2011-05-14 MED ORDER — ALUM & MAG HYDROXIDE-SIMETH 200-200-20 MG/5ML PO SUSP: 30.0000 mL | ORAL | Status: DC | PRN

## 2011-05-14 MED ORDER — NICOTINE 21 MG/24HR TD PT24
21.0000 mg | MEDICATED_PATCH | Freq: Every day | TRANSDERMAL | Status: DC
  Administered 2011-05-14: 21 mg via TRANSDERMAL
  Filled 2011-05-14: qty 1

## 2011-05-14 NOTE — ED Notes (Signed)
Pt to be d/c but has no transportation home until am when the buses run. EDP aware of plan for pt to go home in am.Pt also aware. Waiting for Haldol order.

## 2011-05-14 NOTE — ED Notes (Signed)
Pt was cleared for discharge by tele-psych. EDP notified and is in agreement with disposition. Rn made aware. CSW provided pt with outpatient resources on community mental health, mobile crisis services and [place] [facility]. Pt was able to contract for safety. Pt was able to contact family members who were at her grandmother's home and were agreeable to have pt discharge there. Pt was provided a cab voucher to her grandmother's house at [address] [place]. No further needs at this time.

## 2011-05-14 NOTE — ED Notes (Signed)
Attempted to draw blood twice without success.

## 2011-05-14 NOTE — ED Notes (Signed)
Per [employer] with pt, pt went to her neighbor, states that she told her neighbor that her Bipolar is acting up.  Reports visual hallucinations, states that she's seeing a leprecon telling her to kill herself.

## 2011-05-14 NOTE — ED Notes (Signed)
Pt waiting for Telepsych conference.

## 2011-05-14 NOTE — ED Provider Notes (Addendum)
History     CSN: [id number]  Arrival date & time 05/14/11  1419   First MD Initiated Contact with Patient 05/14/11 1501      Chief Complaint  Patient presents with  . Medical Clearance  . Suicidal    (Consider location/radiation/quality/duration/timing/severity/associated sxs/prior treatment) HPI  25yoF history of schizoaffective disorder, anxiety, hypertension, depression seizure disorder presents with worsening depression, suicidal ideation, auditory and visual hallucinations. The patient states that for the past few days she's been feeling more anxious the usual. She states she is seeing a leprechaun telling her to kill herself. She states that the leprechaun won't go away. She states that she does have a history of cutting but does not plan to cut herself and has had no recent cutting. She does not have a specific plan. She denies homicidal ideation. Denies ethanol, illicit drugs. Repeatedly stating, "I mean, you can just hold me here for a couple days instead of admitting me, I don't mind"   ED Notes, ED Provider Notes from 05/14/11 0000 to 05/14/11 14:35:09       Myna Hidalgo, RN 05/14/2011 14:34      Pt was banging her head on the wall, yelling "don't do that!"         Myna Hidalgo, RN 05/14/2011 14:33      Per [employer] with pt, pt went to her neighbor, states that she told her neighbor that her Bipolar is acting up. Reports visual hallucinations, states that she's seeing a leprecon telling her to kill herself.      Past Medical History  Diagnosis Date  . 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 Brain tumor   . Schizoaffective disorder   . Anxiety   . Seizures   . Hypertension   . Depression   . Cancer 1991    ovarian  . Kidney stones     Past Surgical History  Procedure Date  . Abdominal hysterectomy     partial  . Kidney stone removal   . Appendectomy     Family History  Problem Relation Age of Onset  . Diabetes type II Other     History  Substance  Use Topics  . Smoking status: Current Everyday Smoker -- 1.0 packs/day  . Smokeless tobacco: Not on file  . Alcohol Use: No    OB History    Grav Para Term Preterm Abortions TAB SAB Ect Mult Living                  Review of Systems  All other systems reviewed and are negative.  except as noted HPI   Allergies  Lortab; Toradol; and Tramadol  Home Medications   Current Outpatient Rx  Name Route Sig Dispense Refill  . CLONAZEPAM 1 MG PO TABS Oral Take 1 mg by mouth 3 (three) times daily. For anxiety    . DIPHENHYDRAMINE HCL 25 MG PO CAPS Oral Take 50 mg by mouth every 6 (six) hours as needed.    Marland Kitchen HALOPERIDOL DECANOATE 100 MG/ML IM SOLN Intramuscular Inject 150 mg into the muscle every 28 (twenty-eight) days. Last dose given 03/30/2011    . OXYCODONE-ACETAMINOPHEN 5-325 MG PO TABS Oral Take 1-2 tablets by mouth every 4 (four) hours as needed for pain. 60 tablet 0  . TRAZODONE HCL 100 MG PO TABS Oral Take 1 tablet (100 mg total) by mouth at bedtime. For depression 30 tablet 0  . SERTRALINE HCL 25 MG PO TABS Oral Take 1  tablet (25 mg total) by mouth daily. 30 tablet 0  . TRAZODONE HCL 100 MG PO TABS Oral Take 1 tablet (100 mg total) by mouth at bedtime. 30 tablet 0    BP 155/97  Pulse 86  Temp(Src) 98.4 F (36.9 C) (Oral)  Resp 18  SpO2 96%  Physical Exam  Nursing note and vitals reviewed. Constitutional: She is oriented to person, place, and time. She appears well-developed.  HENT:  Head: Atraumatic.  Mouth/Throat: Oropharynx is clear and moist.  Eyes: Conjunctivae and EOM are normal. Pupils are equal, round, and reactive to light.  Neck: Normal range of motion. Neck supple.  Cardiovascular: Normal rate, regular rhythm, normal heart sounds and intact distal pulses.   Pulmonary/Chest: Effort normal and breath sounds normal. No respiratory distress. She has no wheezes. She has no rales.  Abdominal: Soft. She exhibits no distension. There is no tenderness. There is no  rebound and no guarding.  Musculoskeletal: Normal range of motion.  Neurological: She is alert and oriented to person, place, and time.  Skin: Skin is warm and dry. No rash noted.       B/l UE with scarring from cutting  Psychiatric:       Pointing to things "in the air, leprechauns"    ED Course  Procedures (including critical care time)  Labs Reviewed  COMPREHENSIVE METABOLIC PANEL - Abnormal; Notable for the following:    Glucose, Bld 111 (*)    Total Bilirubin 0.2 (*)    All other components within normal limits  URINALYSIS, ROUTINE W REFLEX MICROSCOPIC - Abnormal; Notable for the following:    APPearance CLOUDY (*)    All other components within normal limits  SALICYLATE LEVEL - Abnormal; Notable for the following:    Salicylate Lvl <2.0 (*)    All other components within normal limits  CBC  PREGNANCY, URINE  ETHANOL  ACETAMINOPHEN LEVEL   No results found.   1. Depression   2. Hallucination   3. Bipolar disorder     MDM  Suicidal ideation, worsening hallucinations, medication non compliance. D/W ACT Team who will evaluate. Telepsych consult for medication recommendations pending.  D/W ACT. Telepsych pending. Patient medically cleared.  telepsych consult complete--recommends outpatient f/u with [facility]. Pt to go home to grandmother's in the morning. Recommends IM haldol inj here in ED (receives every 28 days). Home with prescription for zoloft, trazodone.        Forbes Cellar, MD 05/14/11 2206  Forbes Cellar, MD 05/14/11 [phone]

## 2011-05-14 NOTE — ED Notes (Signed)
Many attempts were made by ED lab staff, RN staff to obtain CBC,Metabolic and ETOH blood draw.  Advised to call EDP.  Notified EDP, EDP to attempt draw.
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
## 2011-05-14 NOTE — ED Notes (Signed)
EDP was successful at [facility] and [facility].

## 2011-05-14 NOTE — ED Notes (Signed)
MD at bedside. [facility] EDP 

## 2011-05-14 NOTE — Discharge Instructions (Signed)
Hallucinations and Delusions  You seem to be having hallucinations and/or delusions. You may be hearing voices that no one else can hear. This can seem very real to you. You may be having thoughts and fears that do not make sense to others. This condition can be due to mental disease like schizophrenia. It may be caused by a medical condition, such as an infection or electrolyte disturbance. These symptoms are also seen in drug abusers, especially those who use crack cocaine and amphetamines. Drugs like PCP, LSD, MDMA, peyote, and psilocybin can also cause frightening hallucinations and loss of control.  If your symptoms are due to drug abuse, your mental state should improve as the drug(s) leave your system. Someone you trust should be with you until you are better to protect you and calm your fears. Often tranquilizers are very helpful at controlling hallucinations, anxiety, and destructive behavior. Getting a proper diet and enough sleep is important to recovery. If your symptoms are not due to drugs, or do not improve over several days after stopping drug use, you need further medical or mental health care.  SEEK IMMEDIATE MEDICAL CARE IF:    Your symptoms get worse, especially if you think your life is in danger   You have violent or destructive thoughts.  Recovery is possible, but you have to get proper treatment and avoid drugs that are known to cause you trouble.  Document Released: 03/22/2004 Document Revised: 02/01/2011 Document Reviewed: 02/12/2005  Exit[employer] Patient Information 2012 [employer].

## 2011-05-14 NOTE — ED Notes (Signed)
Pt was banging her head on the wall, yelling "don't do that!"
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
## 2011-06-18 ENCOUNTER — Encounter (HOSPITAL_COMMUNITY): Payer: Self-pay

## 2011-06-18 ENCOUNTER — Emergency Department (HOSPITAL_COMMUNITY)
Admission: EM | Admit: 2011-06-18 | Discharge: 2011-06-19 | Disposition: A | Discharge status: Home / Self Care | Payer: Medicaid Other | Attending: Emergency Medicine | Admitting: Emergency Medicine

## 2011-06-18 DIAGNOSIS — F17200 Nicotine dependence, unspecified, uncomplicated: Secondary | ICD-10-CM | POA: Insufficient documentation

## 2011-06-18 DIAGNOSIS — G40909 Epilepsy, unspecified, not intractable, without status epilepticus: Secondary | ICD-10-CM | POA: Insufficient documentation

## 2011-06-18 DIAGNOSIS — Z8543 Personal history of malignant neoplasm of ovary: Secondary | ICD-10-CM | POA: Insufficient documentation

## 2011-06-18 DIAGNOSIS — F259 Schizoaffective disorder, unspecified: Secondary | ICD-10-CM | POA: Insufficient documentation

## 2011-06-18 DIAGNOSIS — F411 Generalized anxiety disorder: Secondary | ICD-10-CM | POA: Insufficient documentation

## 2011-06-18 DIAGNOSIS — F3289 Other specified depressive episodes: Secondary | ICD-10-CM | POA: Insufficient documentation

## 2011-06-18 DIAGNOSIS — I10 Essential (primary) hypertension: Secondary | ICD-10-CM | POA: Insufficient documentation

## 2011-06-18 DIAGNOSIS — F329 Major depressive disorder, single episode, unspecified: Secondary | ICD-10-CM
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
## 2011-06-19 ENCOUNTER — Encounter (HOSPITAL_COMMUNITY): Payer: Self-pay | Admitting: *Deleted

## 2011-06-19 DIAGNOSIS — F1210 Cannabis abuse, uncomplicated: Secondary | ICD-10-CM

## 2011-06-19 DIAGNOSIS — F319 Bipolar disorder, unspecified: Secondary | ICD-10-CM

## 2011-06-19 DIAGNOSIS — F1410 Cocaine abuse, uncomplicated: Secondary | ICD-10-CM

## 2011-06-19 DIAGNOSIS — F410 Panic disorder [episodic paroxysmal anxiety] without agoraphobia: Secondary | ICD-10-CM

## 2011-06-19 LAB — ETHANOL: Alcohol, Ethyl (B): <11 mg/dL (ref 0–11)

## 2011-06-19 LAB — COMPREHENSIVE METABOLIC PANEL
Albumin: 3.7 g/dL (ref 3.5–5.2)
Alkaline Phosphatase: 94 U/L (ref 39–117)
BUN: 11 mg/dL (ref 6–23)
CO2: 25 mEq/L (ref 19–32)
Chloride: 101 mEq/L (ref 96–112)
Creatinine, Ser: 0.88 mg/dL (ref 0.50–1.10)
GFR calc non Af Amer: 90 mL/min — ABNORMAL LOW (ref >90)
Potassium: 3.5 mEq/L (ref 3.5–5.1)
Total Bilirubin: 0.2 mg/dL — ABNORMAL LOW (ref 0.3–1.2)

## 2011-06-19 LAB — RAPID URINE DRUG SCREEN, HOSP PERFORMED
Amphetamines: NOT DETECTED
Tetrahydrocannabinol: POSITIVE — AB

## 2011-06-19 LAB — CBC
HCT: 36.9 % (ref 36.0–46.0)
Hemoglobin: 11.8 g/dL — ABNORMAL LOW (ref 12.0–15.0)
MCV: 93.9 fL (ref 78.0–100.0)
RBC: 3.93 MIL/uL (ref 3.87–5.11)
RDW: 13.4 % (ref 11.5–15.5)
WBC: 11 10*3/uL — ABNORMAL HIGH (ref 4.0–10.5)

## 2011-06-19 LAB — POCT PREGNANCY, URINE: Preg Test, Ur: NEGATIVE

## 2011-06-19 MED ORDER — HALOPERIDOL DECANOATE 100 MG/ML IM SOLN
150.0000 mg | INTRAMUSCULAR | Status: DC
  Administered 2011-06-19: 150 mg via INTRAMUSCULAR
  Filled 2011-06-19: qty 1.5

## 2011-06-19 MED ORDER — SERTRALINE HCL 50 MG PO TABS
25.0000 mg | ORAL_TABLET | Freq: Every day | ORAL | Status: DC
  Administered 2011-06-19: 25 mg via ORAL
  Filled 2011-06-19: qty 2

## 2011-06-19 MED ORDER — ONDANSETRON HCL 4 MG PO TABS: 4.0000 mg | ORAL_TABLET | Freq: Three times a day (TID) | ORAL | Status: DC | PRN

## 2011-06-19 MED ORDER — LORAZEPAM 1 MG PO TABS
1.0000 mg | ORAL_TABLET | Freq: Three times a day (TID) | ORAL | Status: DC | PRN
  Administered 2011-06-19: 1 mg via ORAL
  Filled 2011-06-19: qty 1

## 2011-06-19 MED ORDER — TRAZODONE HCL 100 MG PO TABS
100.0000 mg | ORAL_TABLET | Freq: Every day | ORAL | Status: DC
  Administered 2011-06-19: 100 mg via ORAL
  Filled 2011-06-19: qty 1

## 2011-06-19 NOTE — ED Provider Notes (Signed)
Per our psychiatrist the patient is appropriate for d/c with outpatient f/u.    Gerhard Munch, MD 06/19/11 [phone]
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
## 2011-06-19 NOTE — ED Notes (Signed)
Dc instructions reviewed w/ pt.  Pt verbalozed understanding.  Pt denies si/hi/avh at this time and reports no voices or thoughts or harming others.  Pt encouraged to follow up with [facility] tomorrow for her prescriptions and  Seek treatment for any thoughts/urges to harm herself or others.  Pt verbalized understanding

## 2011-06-19 NOTE — ED Notes (Signed)
Up to the bathroom 

## 2011-06-19 NOTE — ED Provider Notes (Signed)
History     CSN: [id number]  Arrival date & time 06/18/11  2318   First MD Initiated Contact with Patient 06/18/11 2352      Chief Complaint  Patient presents with  . Medical Clearance    (Consider location/radiation/quality/duration/timing/severity/associated sxs/prior treatment) Patient is a 26 y.o. female presenting with mental health disorder. The history is provided by the patient. No language interpreter was used.  Mental Health Problem The primary symptoms include dysphoric mood. The primary symptoms do not include delusions, hallucinations or somatic symptoms. The current episode started today. This is a recurrent problem.  The dysphoric mood began this week. The mood has been worsening since its onset. She characterizes the problem as severe. The mood includes feelings of sadness. Precipitated by: nothing.  Precipitated by: nothing off meds. The degree of incapacity that she is experiencing as a consequence of her illness is severe. Sequelae of the illness include harmed interpersonal relations. Additional symptoms of the illness do not include no increased goal-directed activity, no flight of ideas or no inflated self-esteem. She admits to suicidal ideas. She does have a plan to commit suicide. She contemplates harming herself. She has not already injured self. She contemplates injuring another person. She has not already  injured another person. Risk factors that are present for mental illness include a history of mental illness.    Past Medical History  Diagnosis Date  . 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 Brain tumor   . Schizoaffective disorder   . Anxiety   . Seizures   . Hypertension   . Depression   . Cancer 1991    ovarian  . Kidney stones     Past Surgical History  Procedure Date  . Abdominal hysterectomy     partial  . Kidney stone removal   . Appendectomy     Family History  Problem Relation Age of Onset  . Diabetes type II Other      History  Substance Use Topics  . Smoking status: Current Everyday Smoker -- 1.0 packs/day  . Smokeless tobacco: Not on file  . Alcohol Use: No    OB History    Grav Para Term Preterm Abortions TAB SAB Ect Mult Living                  Review of Systems  Respiratory: Negative for shortness of breath.   Cardiovascular: Negative for chest pain.  Psychiatric/Behavioral: Positive for dysphoric mood. Negative for hallucinations and confusion.  All other systems reviewed and are negative.    Allergies  Lortab; Toradol; and Tramadol  Home Medications   Current Outpatient Rx  Name Route Sig Dispense Refill  . CLONAZEPAM 1 MG PO TABS Oral Take 1 mg by mouth 3 (three) times daily. For anxiety    . DIPHENHYDRAMINE HCL 25 MG PO CAPS Oral Take 50 mg by mouth every 6 (six) hours as needed.    Marland Kitchen HALOPERIDOL DECANOATE 100 MG/ML IM SOLN Intramuscular Inject 150 mg into the muscle every 28 (twenty-eight) days. Last dose given 03/30/2011    . SERTRALINE HCL 25 MG PO TABS Oral Take 1 tablet (25 mg total) by mouth daily. 30 tablet 0  . TRAZODONE HCL 100 MG PO TABS Oral Take 1 tablet (100 mg total) by mouth at bedtime. For depression 30 tablet 0  . CLONAZEPAM 1 MG PO TABS Oral Take 1 tablet (1 mg total) by mouth 3 (three) times daily as needed (anxiety). 90 tablet 0  .  TRAZODONE HCL 100 MG PO TABS Oral Take 1 tablet (100 mg total) by mouth at bedtime. 30 tablet 0    BP 143/92  Pulse 96  Temp(Src) 99.1 F (37.3 C) (Oral)  Resp 20  SpO2 98%  Physical Exam  Constitutional: She is oriented to person, place, and time. She appears well-developed and well-nourished.  HENT:  Head: Normocephalic and atraumatic.  Mouth/Throat: Oropharynx is clear and moist.  Eyes: Conjunctivae are normal. Pupils are equal, round, and reactive to light.  Neck: Normal range of motion. Neck supple.  Cardiovascular: Normal rate and regular rhythm.   Pulmonary/Chest: Effort normal and breath sounds normal.   Abdominal: Soft. Bowel sounds are normal. There is no tenderness.  Musculoskeletal: Normal range of motion.  Neurological: She is alert and oriented to person, place, and time.  Skin: Skin is warm and dry.  Psychiatric: Her affect is blunt. Her speech is not rapid and/or pressured.    ED Course  Procedures (including critical care time)  Labs Reviewed  CBC - Abnormal; Notable for the following:    WBC 11.0 (*)    Hemoglobin 11.8 (*)    Platelets 427 (*)    All other components within normal limits  COMPREHENSIVE METABOLIC PANEL  ETHANOL  URINE RAPID DRUG SCREEN (HOSP PERFORMED)   No results found.   No diagnosis found.    MDM  Will need inpatient mental health stabilization.  Case d/w [facility]       Hydeia Mcatee Smitty Cords, MD 06/19/11 [phone]

## 2011-06-19 NOTE — Discharge Instructions (Signed)
Please be sure to follow-up with your physician, and to take advantage of the resources provided to you by our behavioral health team.  If you develop any new, or concerning changes in your condition, or new impulses to hurt yourself (or others) please return to the ED immediately.

## 2011-06-19 NOTE — ED Notes (Signed)
Dr Oneta Rack in w/ pt

## 2011-06-19 NOTE — BH Assessment (Signed)
Patient to be discharged home, per recommendations of Dr. Oneta Rack. Writer informed Dr. Jeraldine Loots of patients disposition and he was agreeable to place patient up for discharge. Patients nurse-Janie was also informed of patients disposition and will assist patient with discharging from the ED appropriately. Writer provided patient with the appropriate paperwork prior to discharge so that she may follow up with out-pt providers (therapist/psychiatrist/mobile crises/support groups). Patient agreed to follow up with referrals given.

## 2011-06-19 NOTE — ED Notes (Signed)
Bus pas given.  Pt is going to [facility] to get her prescriptions tonight

## 2011-06-19 NOTE — ED Notes (Signed)
We were trying to to get the telepsych machine to work,but in the process she said she doesn't want to talk to the doctor and she refused her vitals

## 2011-06-19 NOTE — ED Notes (Signed)
Dr wentz into see 

## 2011-06-19 NOTE — Consult Note (Signed)
Reason for Consult: Bipolar disorder and panic attacks Referring Physician: Dr. Lorelee Market [name] is an 26 y.o. female.  HPI: Patient was seen and chart reviewed. Patient reported that she has been suffering with bipolar disorder anxiety disorder and panic attacks. Patient has stopped taking her medication of in month of March. Patient presented to the emergency department with her symptoms of for anxiety depression and possibly suicidal ideation homicidal ideation. Patients the reportedly makes these allegations when she needed help from the emergency services. Patient received medications Haldol Zoloft trazodone and reported she is feeling better fine and she want to go home and follow up with outpatient psychiatric services. Patient has her God mom who helps her when she needed. Patient urine drug screen was positive for cocaine and marijuana.  Patient denied current suicidal or homicidal ideation, intentions or plans. Patient stated that her mood is served fine and she has a normal rate rhythm and volume of speech. She has no evidence symptoms of for psychosis.   Past Medical History  Diagnosis Date  . 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 Brain tumor   . Schizoaffective disorder   . Anxiety   . Seizures   . Hypertension   . Depression   . Cancer 1991    ovarian  . Kidney stones     Past Surgical History  Procedure Date  . Abdominal hysterectomy     partial  . Kidney stone removal   . Appendectomy     Family History  Problem Relation Age of Onset  . Diabetes type II Other     Social History:  reports that she has been smoking.  She does not have any smokeless tobacco history on file. She reports that she uses illicit drugs (Marijuana and Cocaine) about 7 times per week. She reports that she does not drink alcohol.  Allergies:  Allergies  Allergen Reactions  . Lortab Hives  . Toradol Hives  . Tramadol Hives    Medications: I have reviewed the  patient's current medications.  Results for orders placed during the hospital encounter of 06/18/11 (from the past 48 hour(s))  CBC     Status: Abnormal   Collection Time   06/18/11 11:56 PM      Component Value Range Comment   WBC 11.0 (*) 4.0 - 10.5 (K/uL)    RBC 3.93  3.87 - 5.11 (MIL/uL)    Hemoglobin 11.8 (*) 12.0 - 15.0 (g/dL)    HCT 21.3  08.6 - 57.8 (%)    MCV 93.9  78.0 - 100.0 (fL)    MCH 30.0  26.0 - 34.0 (pg)    MCHC 32.0  30.0 - 36.0 (g/dL)    RDW 46.9  62.9 - 52.8 (%)    Platelets 427 (*) 150 - 400 (K/uL)   COMPREHENSIVE METABOLIC PANEL     Status: Abnormal   Collection Time   06/18/11 11:56 PM      Component Value Range Comment   Sodium 136  135 - 145 (mEq/L)    Potassium 3.5  3.5 - 5.1 (mEq/L)    Chloride 101  96 - 112 (mEq/L)    CO2 25  19 - 32 (mEq/L)    Glucose, Bld 94  70 - 99 (mg/dL)    BUN 11  6 - 23 (mg/dL)    Creatinine, Ser 4.13  0.50 - 1.10 (mg/dL)    Calcium 9.5  8.4 - 10.5 (mg/dL)    Total Protein 7.8  6.0 - 8.3 (g/dL)    Albumin 3.7  3.5 - 5.2 (g/dL)    AST 14  0 - 37 (U/L)    ALT 16  0 - 35 (U/L)    Alkaline Phosphatase 94  39 - 117 (U/L)    Total Bilirubin 0.2 (*) 0.3 - 1.2 (mg/dL)    GFR calc non Af Amer 90 (*) >90 (mL/min)    GFR calc Af Amer >90  >90 (mL/min)   ETHANOL     Status: Normal   Collection Time   06/18/11 11:56 PM      Component Value Range Comment   Alcohol, Ethyl (B) <11  0 - 11 (mg/dL)   URINE RAPID DRUG SCREEN (HOSP PERFORMED)     Status: Abnormal   Collection Time   06/19/11 12:39 AM      Component Value Range Comment   Opiates NONE DETECTED  NONE DETECTED     Cocaine POSITIVE (*) NONE DETECTED     Benzodiazepines NONE DETECTED  NONE DETECTED     Amphetamines NONE DETECTED  NONE DETECTED     Tetrahydrocannabinol POSITIVE (*) NONE DETECTED     Barbiturates NONE DETECTED  NONE DETECTED    POCT PREGNANCY, URINE     Status: Normal   Collection Time   06/19/11 12:53 AM      Component Value Range Comment   Preg Test, Ur  NEGATIVE  NEGATIVE      No results found.  No psychosis and Positive for bipolar, depression and mood swings Blood pressure 151/91, pulse 79, temperature 98.3 F (36.8 C), temperature source Oral, resp. rate 16, SpO2 99.00%.   Assessment/Plan: Bipolar disorder Panic disorder Marijuana and cocaine abuse Noncompliance with treatment  Recommended to discharged home with outpatient psychiatric services and medication management at [facility].  Finch Costanzo,JANARDHAHA R. 06/19/2011, 5:42 PM

## 2011-06-19 NOTE — ED Provider Notes (Signed)
The patient carefully states that she is suicidal, homicidal root. She does not have any active plans. She was here last month with similar complaints and was discharged to followup at [facility]. She has been cooperative since arriving. She is currently voluntary. We'll have her seen by the psychiatry service today  Flint Melter, MD 06/19/11 [phone]

## 2011-07-03 ENCOUNTER — Emergency Department (HOSPITAL_COMMUNITY)
Admission: EM | Admit: 2011-07-03 | Discharge: 2011-07-03 | Disposition: A | Discharge status: Home / Self Care | Payer: Medicaid Other | Attending: Emergency Medicine | Admitting: Emergency Medicine

## 2011-07-03 ENCOUNTER — Encounter (HOSPITAL_COMMUNITY): Payer: Self-pay | Admitting: *Deleted

## 2011-07-03 ENCOUNTER — Other Ambulatory Visit (HOSPITAL_COMMUNITY): Payer: Self-pay | Admitting: Pharmacy Technician

## 2011-07-03 ENCOUNTER — Telehealth (HOSPITAL_COMMUNITY): Payer: Self-pay | Admitting: Pharmacist

## 2011-07-03 DIAGNOSIS — Z8543 Personal history of malignant neoplasm of ovary: Secondary | ICD-10-CM | POA: Insufficient documentation

## 2011-07-03 DIAGNOSIS — Z79899 Other long term (current) drug therapy: Secondary | ICD-10-CM | POA: Insufficient documentation

## 2011-07-03 DIAGNOSIS — G40909 Epilepsy, unspecified, not intractable, without status epilepticus: Secondary | ICD-10-CM | POA: Insufficient documentation

## 2011-07-03 DIAGNOSIS — F259 Schizoaffective disorder, unspecified: Secondary | ICD-10-CM | POA: Insufficient documentation

## 2011-07-03 DIAGNOSIS — Z87442 Personal history of urinary calculi: Secondary | ICD-10-CM | POA: Insufficient documentation

## 2011-07-03 DIAGNOSIS — F17200 Nicotine dependence, unspecified, uncomplicated: Secondary | ICD-10-CM | POA: Insufficient documentation

## 2011-07-03 DIAGNOSIS — N949 Unspecified condition associated with female genital organs and menstrual cycle: Secondary | ICD-10-CM | POA: Insufficient documentation

## 2011-07-03 DIAGNOSIS — F411 Generalized anxiety disorder: Secondary | ICD-10-CM | POA: Insufficient documentation

## 2011-07-03 DIAGNOSIS — R102 Pelvic and perineal pain: Secondary | ICD-10-CM

## 2011-07-03 DIAGNOSIS — I10 Essential (primary) hypertension: Secondary | ICD-10-CM | POA: Insufficient documentation

## 2011-07-03 DIAGNOSIS — F329 Major depressive disorder, single episode, unspecified: Secondary | ICD-10-CM | POA: Insufficient documentation

## 2011-07-03 DIAGNOSIS — F3289 Other specified depressive episodes: Secondary | ICD-10-CM | POA: Insufficient documentation

## 2011-07-03 LAB — WET PREP, GENITAL: Trich, Wet Prep: NONE SEEN

## 2011-07-03 MED ORDER — LIDOCAINE-EPINEPHRINE (PF) 2 %-1:200000 IJ SOLN
5.0000 mL | Freq: Once | INTRAMUSCULAR | Status: AC
  Administered 2011-07-03: 5 mL via INTRADERMAL

## 2011-07-03 MED ORDER — OXYCODONE-ACETAMINOPHEN 5-325 MG PO TABS
1.0000 | ORAL_TABLET | Freq: Once | ORAL | Status: AC
  Administered 2011-07-03: 1 via ORAL

## 2011-07-03 MED ORDER — OXYCODONE-ACETAMINOPHEN 5-325 MG PO TABS
ORAL_TABLET | ORAL | Status: AC
  Administered 2011-07-03: 1 via ORAL
  Filled 2011-07-03: qty 1

## 2011-07-03 MED ORDER — HYDROCODONE-ACETAMINOPHEN 5-325 MG PO TABS: 1.0000 | ORAL_TABLET | ORAL | Status: AC | PRN

## 2011-07-03 MED ORDER — OXYCODONE-ACETAMINOPHEN 5-325 MG PO TABS: 1.0000 | ORAL_TABLET | Freq: Once | ORAL | Status: DC

## 2011-07-03 NOTE — ED Notes (Signed)
Pt requesting a female physician

## 2011-07-03 NOTE — ED Provider Notes (Signed)
History     CSN: [id number]  Arrival date & time 07/03/11  1657   First MD Initiated Contact with Patient 07/03/11 2014      Chief Complaint  Patient presents with  . Abscess    The history is provided by the patient.   the patient reports worsening swelling in her right groin for 3-4 days.  She reports it's just lateral to her vagina.  She denies vaginal bleeding or vaginal discharge.  She has no abdominal pain.  She denies nausea vomiting.  She's had no fever or chills.  She denies spreading redness.  She has had no recent sexual contact.  She has no prior history of abscess  Past Medical History  Diagnosis Date  . 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 Brain tumor   . Schizoaffective disorder   . Anxiety   . Seizures   . Hypertension   . Depression   . Cancer 1991    ovarian  . Kidney stones     Past Surgical History  Procedure Date  . Abdominal hysterectomy     partial  . Kidney stone removal   . Appendectomy     Family History  Problem Relation Age of Onset  . Diabetes type II Other     History  Substance Use Topics  . Smoking status: Current Everyday Smoker -- 1.0 packs/day  . Smokeless tobacco: Not on file  . Alcohol Use: No    OB History    Grav Para Term Preterm Abortions TAB SAB Ect Mult Living                  Review of Systems  All other systems reviewed and are negative.    Allergies  Ketorolac tromethamine; Lortab; and Tramadol  Home Medications   Current Outpatient Rx  Name Route Sig Dispense Refill  . ALBUTEROL SULFATE HFA 108 (90 BASE) MCG/ACT IN AERS Inhalation Inhale 2 puffs into the lungs every 6 (six) hours as needed. For shortness of breath    . CLONAZEPAM 1 MG PO TABS Oral Take 1 mg by mouth 3 (three) times daily. For anxiety    . DIPHENHYDRAMINE HCL 25 MG PO CAPS Oral Take 50 mg by mouth every 6 (six) hours as needed. For seasonal allergy relief    . HALOPERIDOL DECANOATE 100 MG/ML IM SOLN Intramuscular Inject  150 mg into the muscle every 28 (twenty-eight) days. Last dose given 03/30/2011    . IBUPROFEN 400 MG PO TABS Oral Take 400 mg by mouth every 6 (six) hours as needed. For pain relief    . SERTRALINE HCL 25 MG PO TABS Oral Take 1 tablet (25 mg total) by mouth daily. 30 tablet 0  . TRAZODONE HCL 100 MG PO TABS Oral Take 1 tablet (100 mg total) by mouth at bedtime. For depression 30 tablet 0  . CLONAZEPAM 1 MG PO TABS Oral Take 1 tablet (1 mg total) by mouth 3 (three) times daily as needed (anxiety). 90 tablet 0  . HYDROCODONE-ACETAMINOPHEN 5-325 MG PO TABS Oral Take 1 tablet by mouth every 4 (four) hours as needed for pain. 8 tablet 0  . TRAZODONE HCL 100 MG PO TABS Oral Take 1 tablet (100 mg total) by mouth at bedtime. 30 tablet 0    BP 150/92  Pulse 92  Temp(Src) 98.3 F (36.8 C) (Oral)  Resp 18  SpO2 100%  Physical Exam  Nursing note and vitals reviewed. Constitutional: She is oriented to  person, place, and time. She appears well-developed and well-nourished. No distress.  HENT:  Head: Normocephalic and atraumatic.  Eyes: EOM are normal.  Neck: Normal range of motion.  Cardiovascular: Normal rate, regular rhythm and normal heart sounds.   Pulmonary/Chest: Effort normal and breath sounds normal.  Abdominal: Soft. She exhibits no distension. There is no tenderness.  Genitourinary:       No cervical motion tenderness.  No vaginal discharge.  No vaginal bleeding.  No adnexal masses or fullness.  The patient is a small mobile nonfluctuant mass in her right groin at the inferior lateral portion of her labia majora.  There is no surrounding erythema or warmth.  She is tender in this area.  Musculoskeletal: Normal range of motion.  Neurological: She is alert and oriented to person, place, and time.  Skin: Skin is warm and dry.  Psychiatric: She has a normal mood and affect. Judgment normal.    ED Course  Procedures (including critical care time)  INCISION AND DRAINAGE Performed by:  Lyanne Co Consent: Verbal consent obtained. Risks and benefits: risks, benefits and alternatives were discussed Time out performed prior to procedure Type: abscess/swelling of right groin Body area: Right groin Anesthesia: local infiltration Local anesthetic: lidocaine 2 % with epinephrine Anesthetic total: 3 ml Complexity: simple Drainage: none Drainage amount: none Packing material: none Patient tolerance: Patient tolerated the procedure well with no immediate complications.      Labs Reviewed  WET PREP, GENITAL - Abnormal; Notable for the following:    Clue Cells Wet Prep HPF POC FEW (*)    WBC, Wet Prep HPF POC FEW (*)    All other components within normal limits  GC/CHLAMYDIA PROBE AMP, GENITAL   No results found.   1. Vaginal pain       MDM  This is likely a small lymph node.  Incision and drainage was performed but no pus was noted.  She does not need to be on antibiotics.  There is no external signs of cellulitis.  Warm dial soap soaks are recommended        Lyanne Co, MD 07/05/11 0010

## 2011-07-03 NOTE — Telephone Encounter (Signed)
Encounter opened in error in epic.  Chima Astorino, Loma Messing PharmD 5:26 PM 07/03/2011

## 2011-07-03 NOTE — ED Notes (Signed)
Per EMS- pt in c/o vaginal abscess for unknown amount of time

## 2011-07-05 LAB — GC/CHLAMYDIA PROBE AMP, GENITAL
Chlamydia, DNA Probe: NEGATIVE
GC Probe Amp, Genital: NEGATIVE

## 2011-07-08 ENCOUNTER — Emergency Department (HOSPITAL_COMMUNITY)
Admission: EM | Admit: 2011-07-08 | Discharge: 2011-07-09 | Disposition: A | Discharge status: Home / Self Care | Payer: Medicaid Other | Attending: Emergency Medicine | Admitting: Emergency Medicine

## 2011-07-08 ENCOUNTER — Encounter (HOSPITAL_COMMUNITY): Payer: Self-pay | Admitting: Emergency Medicine

## 2011-07-08 DIAGNOSIS — F3289 Other specified depressive episodes: Secondary | ICD-10-CM | POA: Insufficient documentation

## 2011-07-08 DIAGNOSIS — F411 Generalized anxiety disorder: Secondary | ICD-10-CM | POA: Insufficient documentation

## 2011-07-08 DIAGNOSIS — F329 Major depressive disorder, single episode, unspecified: Secondary | ICD-10-CM | POA: Insufficient documentation

## 2011-07-08 DIAGNOSIS — I10 Essential (primary) hypertension: Secondary | ICD-10-CM | POA: Insufficient documentation

## 2011-07-08 DIAGNOSIS — Z79899 Other long term (current) drug therapy: Secondary | ICD-10-CM | POA: Insufficient documentation

## 2011-07-08 DIAGNOSIS — F17200 Nicotine dependence, unspecified, uncomplicated: Secondary | ICD-10-CM | POA: Insufficient documentation

## 2011-07-08 DIAGNOSIS — F251 Schizoaffective disorder, depressive type: Secondary | ICD-10-CM

## 2011-07-08 DIAGNOSIS — F259 Schizoaffective disorder, unspecified: Secondary | ICD-10-CM | POA: Insufficient documentation

## 2011-07-08 LAB — CBC
Hemoglobin: 11.7 g/dL — ABNORMAL LOW (ref 12.0–15.0)
Platelets: 346 10*3/uL (ref 150–400)
RBC: 4.02 MIL/uL (ref 3.87–5.11)

## 2011-07-08 NOTE — ED Notes (Signed)
Pt alert, nad, arrives via [facility] from home, c/o SI, pt denies plan, resp even unlabored, skin pwd

## 2011-07-09 DIAGNOSIS — F259 Schizoaffective disorder, unspecified: Secondary | ICD-10-CM

## 2011-07-09 LAB — URINALYSIS, ROUTINE W REFLEX MICROSCOPIC
Bilirubin Urine: NEGATIVE
Glucose, UA: NEGATIVE mg/dL
Hgb urine dipstick: NEGATIVE
Ketones, ur: NEGATIVE mg/dL
Leukocytes, UA: NEGATIVE
Nitrite: NEGATIVE
Protein, ur: NEGATIVE mg/dL
Specific Gravity, Urine: 1.018 (ref 1.005–1.030)
Urobilinogen, UA: 1 mg/dL (ref 0.0–1.0)
pH: 6.5 (ref 5.0–8.0)

## 2011-07-09 LAB — PREGNANCY, URINE: Preg Test, Ur: NEGATIVE

## 2011-07-09 LAB — ETHANOL: Alcohol, Ethyl (B): <11 mg/dL (ref 0–11)

## 2011-07-09 LAB — COMPREHENSIVE METABOLIC PANEL
ALT: 12 U/L (ref 0–35)
AST: 15 U/L (ref 0–37)
Alkaline Phosphatase: 93 U/L (ref 39–117)
CO2: 24 mEq/L (ref 19–32)
GFR calc Af Amer: 64 mL/min — ABNORMAL LOW (ref >90)
GFR calc non Af Amer: 55 mL/min — ABNORMAL LOW (ref >90)
Glucose, Bld: 119 mg/dL — ABNORMAL HIGH (ref 70–99)
Potassium: 3.5 mEq/L (ref 3.5–5.1)
Sodium: 140 mEq/L (ref 135–145)
Total Protein: 7.4 g/dL (ref 6.0–8.3)

## 2011-07-09 LAB — RAPID URINE DRUG SCREEN, HOSP PERFORMED
Barbiturates: NOT DETECTED
Benzodiazepines: NOT DETECTED
Tetrahydrocannabinol: POSITIVE — AB

## 2011-07-09 MED ORDER — ALBUTEROL SULFATE HFA 108 (90 BASE) MCG/ACT IN AERS: 2.0000 | INHALATION_SPRAY | Freq: Four times a day (QID) | RESPIRATORY_TRACT | Status: DC | PRN

## 2011-07-09 MED ORDER — DIPHENHYDRAMINE HCL 25 MG PO CAPS: 50.0000 mg | ORAL_CAPSULE | Freq: Four times a day (QID) | ORAL | Status: DC | PRN

## 2011-07-09 MED ORDER — ZIPRASIDONE MESYLATE 20 MG IM SOLR
10.0000 mg | Freq: Once | INTRAMUSCULAR | Status: AC
  Administered 2011-07-09: 10 mg via INTRAMUSCULAR
  Filled 2011-07-09: qty 20

## 2011-07-09 MED ORDER — SERTRALINE HCL 50 MG PO TABS
25.0000 mg | ORAL_TABLET | Freq: Every day | ORAL | Status: DC
  Administered 2011-07-09: 25 mg via ORAL
  Filled 2011-07-09: qty 1

## 2011-07-09 MED ORDER — TRAZODONE HCL 100 MG PO TABS: 100.0000 mg | ORAL_TABLET | Freq: Every day | ORAL | Status: DC

## 2011-07-09 MED ORDER — ONDANSETRON HCL 4 MG PO TABS: 4.0000 mg | ORAL_TABLET | Freq: Three times a day (TID) | ORAL | Status: DC | PRN

## 2011-07-09 MED ORDER — LORAZEPAM 1 MG PO TABS
1.0000 mg | ORAL_TABLET | Freq: Three times a day (TID) | ORAL | Status: DC | PRN
  Administered 2011-07-09: 1 mg via ORAL
  Filled 2011-07-09: qty 1

## 2011-07-09 NOTE — BH Assessment (Signed)
Dr. Oneta Rack evaluated patient and recommended discharge to [facility]. Writer informed the EDP (Dr. Jeraldine Loots) of patients acceptance. He agreed to the disposition. Writer provided patient with out-pt referrals [id number], [facility], [facility], etc.

## 2011-07-09 NOTE — ED Provider Notes (Signed)
Medical screening examination/treatment/procedure(s) were performed by non-physician practitioner and as supervising physician I was immediately available for consultation/collaboration.  Mckaylin Bastien M Ercole Georg, MD 07/09/11 0739 
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
## 2011-07-09 NOTE — Discharge Instructions (Signed)
Schizoaffective Disorder Schizoaffective disorder is a condition with a combination of features of schizophrenia and a mood disorder. There are symptoms of a psychotic disorder such as distorted thinking, hallucinations or delusions (schizophrenia feature) and depression or mania (mood disorder feature). There are 2 subtypes based on the mood component:   Bipolar Type.   Depressive Type.  CAUSES  Schizoaffective disorder, like schizophrenia, appears to have distinct genetic links. It is unknown as to what exactly causes the disorder. Some experts believe it involves brain chemistry such as an imbalance of serotonin and dopamine in the brain. These naturally occurring chemicals help relay electronic signals in the brain and help regulate mood. Other experts have speculated whether fetal exposure to toxins or viral illness, or even birth complications may play a role. However, there is no proof of this relationship. SYMPTOMS  Symptoms are those of both schizophrenia and mood disorders. These may include:  Hearing, seeing, or feeling things that are not there (hallucinations).   False beliefs (delusions).   Not taking care of oneself (for example, not bathing or grooming).   Speaking in a way that makes no sense to others.   Withdrawing or feeling isolated from other people.   Thoughts that race from one idea to the next.   Feelings of sadness, guilt, hopelessness, and anxiety.   Feelings of being excessively happy, powerful, or energetic.   Feeling drained of energy.   Losing or gaining weight.   Being unable to concentrate.   Sleeping more or less than normal.  DIAGNOSIS  The diagnosis is made when the patient has features of both illnesses. The patient does not strictly have schizophrenia or a mood disorder alone. Unfortunately, determining if a patient has 2 separate illnesses (schizophrenia or a mood disorder), a combination of illnesses (schizophrenia and a mood disorder), or  perhaps even a separate illness apart from schizophrenia or a mood disorder is difficult. An accurate diagnosis has 3 key parts:  The patient clearly has a major depressive disorder or mania.   They meet the criteria for schizophrenia (distorted thinking, hallucinations or delusions).   The patient must have psychosis for at least 2 weeks without a mood disorder.  The diagnosis of this disorder frequently requires several evaluations. Caregivers will evaluate the patient's symptoms over a length of time. The diagnosis can be made when the following are observed:  An uninterrupted period of mental illness (possibly lasting weeks or months) occurs during which a major depressive episode, a manic episode (both are mood disorder components), or a mixed episode occurs with symptoms of schizophrenia. A major depressive episode must include a depressed mood. Also:   This uninterrupted period of illness includes a minimum 2 week episode of active distorted thinking, hallucinations or delusions (schizophrenia feature).   During this same 2 week (or greater) period, the patient has no mood changes of depression or mania.   Outside of the specific 2 week period noted above, mood episodes are present for a big part of the total active and lingering periods of illness.   The disturbance is not due to the direct effects of a substance (such as street drugs or prescription medications) or a general medical condition.   The bipolar type is diagnosed if the disturbance includes a period of mania, a major depressive episode or a mixed episode.   The depressive type is diagnosed if the problem includes only major depressive episodes.  TREATMENT  Treatment usually includes a combination of medications and counseling. The exact   regimen varies depending on the type and severity of symptoms. It also depends on whether the disorder is a depressive-type or bipolar-type. Medications are prescribed to help with  psychotic symptoms, stabilize mood and treat depression. Psychotherapy can help curb distorted thoughts, teach appropriate social skills and decrease social isolation. Medications may include:  Antipsychotics. These are also called neuroleptics. These are prescribed to help with psychotic symptoms such as delusions, paranoia and hallucinations. Common medications in this category include clozapine (Clozaril), risperidone (Risperdal) and olanzapine (Zyprexa).   Mood-stabilizing medications. When the schizoaffective disorder is bipolar-type, mood stabilizers can level out the highs and lows of bipolar disorder, also known as manic depression. People with bipolar disorder have episodes of mania and depressed mood. Examples of mood stabilizers include lithium (Eskalith, Lithobid) and divalproex (Depakote).   Antidepressants. When depression is the underlying mood disorder, antidepressants can help with feelings of sadness, hopelessness, or difficulty with sleep and concentration. Common medications include citalopram (Celexa), fluoxetine and escitalopram (Lexapro).  Non-medication treatment may include:  Psychotherapy and counseling. Building a trusting relationship in therapy can help people with schizoaffective disorder better understand their condition and feel hopeful about their future. Effective sessions focus on real-life plans, problems, and relationships. Skills and behaviors specific to the home or workplace may be discussed.   Family or group therapy. Treatment can be more effective when people with schizoaffective disorder can discuss their problems with others. Supportive group settings can help decrease social isolation and provide a reality check during periods of psychosis.  In general, people with schizoaffective disorder have a better prognosis than people with schizophrenia, but not as good as people with mood disorders only. Long-term treatment is necessary The prognosis varies from  person to person. HOME CARE INSTRUCTIONS   Take your medicine as prescribed, even when you are feeling and thinking well.   Participate in individual and group counseling as recommended by your caregiver.  SEEK MEDICAL CARE IF:   You feel that you are experiencing side effects from prescription medications.   Contact your caregiver if you feel that symptoms are worsening despite using medication as prescribed and participating in counseling/group therapy sessions.  SEEK IMMEDIATE MEDICAL CARE IF:  You feel an urge to hurt yourself or are thinking about committing suicide. This is an emergency and you should be evaluated immediately. MAKE SURE YOU:   Understand these instructions.   Will watch your condition.   Will get help right away if you are not doing well or get worse.  Document Released: 06/25/2006 Document Revised: 02/01/2011 Document Reviewed: 08/26/2006 Exit[employer] Patient Information 2012 [employer]. 
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
## 2011-07-09 NOTE — ED Provider Notes (Signed)
Per Dr. Shela Commons, the patient is appropriate for d/c.  She was provided resources.  Gerhard Munch, MD 07/09/11 [phone]

## 2011-07-09 NOTE — ED Provider Notes (Signed)
History     CSN: [id number]  Arrival date & time 07/08/11  2300   First MD Initiated Contact with Patient 07/08/11 2348      Chief Complaint  Patient presents with  . Medical Clearance  . Suicidal    (Consider location/radiation/quality/duration/timing/severity/associated sxs/prior treatment) HPI Comments: Patient here with worsening auditory hallucinations - she states that she stopped taking her medications several days ago "to see how long I could go" and states that today "the voices" have started talking to her - they are telling her to cut herself and to cut other people - she is specifically asking for a shot of ativan to help her - according to staff she had been hitting her head on the wall in triage prior to me coming in to see her - I have seen this patient numerous times in the past and she now calls me "mama"  Patient is a 26 y.o. female presenting with mental health disorder. The history is provided by the patient. No language interpreter was used.  Mental Health Problem The primary symptoms include hallucinations, bizarre behavior and disorganized speech. The primary symptoms do not include dysphoric mood, delusions, negative symptoms or somatic symptoms. The current episode started today. This is a new problem.  The degree of incapacity that she is experiencing as a consequence of her illness is moderate. Sequelae of the illness include an inability to work and an inability to care for self. Additional symptoms of the illness include distractible and poor judgment. Additional symptoms of the illness do not include no anhedonia, no insomnia, no hypersomnia, no appetite change, no unexpected weight change, no fatigue, no agitation, no psychomotor retardation, no feelings of worthlessness, no attention impairment, no euphoric mood, no increased goal-directed activity, no flight of ideas, no inflated self-esteem, no decreased need for sleep, no visual change, no headaches, no  abdominal pain or no seizures. She admits to suicidal ideas. She does not have a plan to commit suicide. She contemplates harming herself. She has not already injured self. She contemplates injuring another person. She has not already  injured another person. Risk factors that are present for mental illness include a history of mental illness.    Past Medical History  Diagnosis Date  . 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 Brain tumor   . Schizoaffective disorder   . Anxiety   . Seizures   . Hypertension   . Depression   . Cancer 1991    ovarian  . Kidney stones     Past Surgical History  Procedure Date  . Abdominal hysterectomy     partial  . Kidney stone removal   . Appendectomy     Family History  Problem Relation Age of Onset  . Diabetes type II Other     History  Substance Use Topics  . Smoking status: Current Everyday Smoker -- 1.0 packs/day  . Smokeless tobacco: Not on file  . Alcohol Use: No    OB History    Grav Para Term Preterm Abortions TAB SAB Ect Mult Living                  Review of Systems  Constitutional: Negative for appetite change, fatigue and unexpected weight change.  Gastrointestinal: Negative for abdominal pain.  Neurological: Negative for seizures and headaches.  Psychiatric/Behavioral: Positive for hallucinations. Negative for dysphoric mood and agitation. The patient does not have insomnia.   All other systems reviewed and are negative.  Allergies  Ketorolac tromethamine; Lortab; and Tramadol  Home Medications   Current Outpatient Rx  Name Route Sig Dispense Refill  . ALBUTEROL SULFATE HFA 108 (90 BASE) MCG/ACT IN AERS Inhalation Inhale 2 puffs into the lungs every 6 (six) hours as needed. For shortness of breath    . CLONAZEPAM 1 MG PO TABS Oral Take 1 mg by mouth 3 (three) times daily. For anxiety    . DIPHENHYDRAMINE HCL 25 MG PO CAPS Oral Take 50 mg by mouth every 6 (six) hours as needed. For seasonal allergy  relief    . HYDROCODONE-ACETAMINOPHEN 5-325 MG PO TABS Oral Take 1 tablet by mouth every 4 (four) hours as needed for pain. 8 tablet 0  . IBUPROFEN 400 MG PO TABS Oral Take 400 mg by mouth every 6 (six) hours as needed. For pain relief    . SERTRALINE HCL 25 MG PO TABS Oral Take 1 tablet (25 mg total) by mouth daily. 30 tablet 0  . TRAZODONE HCL 100 MG PO TABS Oral Take 1 tablet (100 mg total) by mouth at bedtime. For depression 30 tablet 0  . HALOPERIDOL DECANOATE 100 MG/ML IM SOLN Intramuscular Inject 150 mg into the muscle every 28 (twenty-eight) days. Last dose given 03/30/2011      BP 134/90  Pulse 89  Temp 98 F (36.7 C)  Resp 16  SpO2 99%  Physical Exam  Nursing note and vitals reviewed. Constitutional: She appears well-developed and well-nourished. She appears distressed.  HENT:  Head: Normocephalic and atraumatic.  Right Ear: External ear normal.  Left Ear: External ear normal.  Nose: Nose normal.  Mouth/Throat: Oropharynx is clear and moist. No oropharyngeal exudate.  Eyes: Conjunctivae are normal. Pupils are equal, round, and reactive to light. No scleral icterus.  Neck: Normal range of motion. Neck supple.  Cardiovascular: Normal rate, regular rhythm and normal heart sounds.  Exam reveals no gallop and no friction rub.   No murmur heard. Pulmonary/Chest: Effort normal and breath sounds normal. No respiratory distress. She has no wheezes. She has no rales. She exhibits no tenderness.  Abdominal: Soft. Bowel sounds are normal. She exhibits no distension. There is no tenderness.  Musculoskeletal: Normal range of motion. She exhibits no edema and no tenderness.  Lymphadenopathy:    She has no cervical adenopathy.  Neurological: She is alert. No cranial nerve deficit.  Skin: Skin is warm and dry. No rash noted. No erythema. No pallor.  Psychiatric: Her affect is labile. Her speech is rapid and/or pressured and tangential. She is agitated and actively hallucinating. Thought  content is delusional. Cognition and memory are impaired. She expresses impulsivity and inappropriate judgment. She expresses homicidal and suicidal ideation. She expresses no suicidal plans and no homicidal plans.    ED Course  Procedures (including critical care time)  Labs Reviewed  CBC - Abnormal; Notable for the following:    Hemoglobin 11.7 (*)    All other components within normal limits  COMPREHENSIVE METABOLIC PANEL  ETHANOL  URINE RAPID DRUG SCREEN (HOSP PERFORMED)  PREGNANCY, URINE  URINALYSIS, ROUTINE W REFLEX MICROSCOPIC   No results found. Results for orders placed during the hospital encounter of 07/08/11  CBC      Component Value Range   WBC 5.9  4.0 - 10.5 (K/uL)   RBC 4.02  3.87 - 5.11 (MIL/uL)   Hemoglobin 11.7 (*) 12.0 - 15.0 (g/dL)   HCT 78.4  69.6 - 29.5 (%)   MCV 93.5  78.0 - 100.0 (fL)  MCH 29.1  26.0 - 34.0 (pg)   MCHC 31.1  30.0 - 36.0 (g/dL)   RDW 27.2  53.6 - 64.4 (%)   Platelets 346  150 - 400 (K/uL)  COMPREHENSIVE METABOLIC PANEL      Component Value Range   Sodium 140  135 - 145 (mEq/L)   Potassium 3.5  3.5 - 5.1 (mEq/L)   Chloride 106  96 - 112 (mEq/L)   CO2 24  19 - 32 (mEq/L)   Glucose, Bld 119 (*) 70 - 99 (mg/dL)   BUN 10  6 - 23 (mg/dL)   Creatinine, Ser 0.34 (*) 0.50 - 1.10 (mg/dL)   Calcium 9.1  8.4 - 74.2 (mg/dL)   Total Protein 7.4  6.0 - 8.3 (g/dL)   Albumin 3.7  3.5 - 5.2 (g/dL)   AST 15  0 - 37 (U/L)   ALT 12  0 - 35 (U/L)   Alkaline Phosphatase 93  39 - 117 (U/L)   Total Bilirubin 0.4  0.3 - 1.2 (mg/dL)   GFR calc non Af Amer 55 (*) >90 (mL/min)   GFR calc Af Amer 64 (*) >90 (mL/min)  ETHANOL      Component Value Range   Alcohol, Ethyl (B) <11  0 - 11 (mg/dL)   No results found.    Psychosis Suicidal ideation Homicidal ideation    MDM  Patient with long history of suicidal and homicidal ideation with psychosis and well known to this provider presents after taking herself off her medication "to see how long I  could go" before hearing voices.  She has been medically cleared and seen by [facility] who reports that the patient is awaiting bed at [facility].          Izola Price [place], [place] 07/09/11 [phone]

## 2011-07-16 ENCOUNTER — Encounter (HOSPITAL_COMMUNITY): Payer: Self-pay | Admitting: Emergency Medicine

## 2011-07-16 ENCOUNTER — Emergency Department (HOSPITAL_COMMUNITY)
Admission: EM | Admit: 2011-07-16 | Discharge: 2011-07-16 | Disposition: A | Discharge status: Home / Self Care | Payer: Medicaid Other | Attending: Emergency Medicine | Admitting: Emergency Medicine

## 2011-07-16 DIAGNOSIS — G40909 Epilepsy, unspecified, not intractable, without status epilepticus: Secondary | ICD-10-CM | POA: Insufficient documentation

## 2011-07-16 DIAGNOSIS — T8090XA Unspecified complication following infusion and therapeutic injection, initial encounter: Secondary | ICD-10-CM

## 2011-07-16 DIAGNOSIS — F341 Dysthymic disorder: Secondary | ICD-10-CM | POA: Insufficient documentation

## 2011-07-16 DIAGNOSIS — Z8659 Personal history of other mental and behavioral disorders: Secondary | ICD-10-CM | POA: Insufficient documentation

## 2011-07-16 DIAGNOSIS — L989 Disorder of the skin and subcutaneous tissue, unspecified: Secondary | ICD-10-CM | POA: Insufficient documentation

## 2011-07-16 DIAGNOSIS — I10 Essential (primary) hypertension: Secondary | ICD-10-CM | POA: Insufficient documentation

## 2011-07-16 DIAGNOSIS — T888XXA Other specified complications of surgical and medical care, not elsewhere classified, initial encounter: Secondary | ICD-10-CM | POA: Insufficient documentation

## 2011-07-16 DIAGNOSIS — Y849 Medical procedure, unspecified as the cause of abnormal reaction of the patient, or of later complication, without mention of misadventure at the time of the procedure: Secondary | ICD-10-CM | POA: Insufficient documentation

## 2011-07-16 MED ORDER — SULFAMETHOXAZOLE-TRIMETHOPRIM 800-160 MG PO TABS: 1.0000 | ORAL_TABLET | Freq: Two times a day (BID) | ORAL | Status: AC

## 2011-07-16 MED ORDER — OXYCODONE-ACETAMINOPHEN 5-325 MG PO TABS
1.0000 | ORAL_TABLET | Freq: Once | ORAL | Status: AC
  Administered 2011-07-16: 1 via ORAL
  Filled 2011-07-16: qty 1

## 2011-07-16 MED ORDER — DICLOFENAC SODIUM 75 MG PO TBEC: 75.0000 mg | DELAYED_RELEASE_TABLET | Freq: Two times a day (BID) | ORAL | Status: DC

## 2011-07-16 NOTE — ED Provider Notes (Signed)
History     CSN: [id number]  Arrival date & time 07/16/11  2048   First MD Initiated Contact with Patient 07/16/11 2216      Chief Complaint  Patient presents with  . Skin Abcess     (Consider location/radiation/quality/duration/timing/severity/associated sxs/prior treatment) HPI Hx from pt. 26yo F who presents with c/o "knot to my left shoulder." She states that this came up after receiving IM Haldol to the area about 2 weeks ago. The area has not been increasing in size since it first began. She has not noted any overlying erythema or swelling. Denies fever or chills. Denies difficulty moving arm although states this worsens pain.  Past Medical History  Diagnosis Date  . 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 Brain tumor   . Schizoaffective disorder   . Anxiety   . Seizures   . Hypertension   . Depression   . Cancer 1991    ovarian  . Kidney stones     Past Surgical History  Procedure Date  . Abdominal hysterectomy     partial  . Kidney stone removal   . Appendectomy     Family History  Problem Relation Age of Onset  . Diabetes type II Other     History  Substance Use Topics  . Smoking status: Current Everyday Smoker -- 1.0 packs/day  . Smokeless tobacco: Not on file  . Alcohol Use: No    OB History    Grav Para Term Preterm Abortions TAB SAB Ect Mult Living                  Review of Systems  Constitutional: Negative for fever, chills, activity change and appetite change.  Musculoskeletal: Positive for myalgias.  Skin: Negative for color change, rash and wound.  Neurological: Negative for weakness and numbness.    Allergies  Ketorolac tromethamine; Lortab; and Tramadol  Home Medications   Current Outpatient Rx  Name Route Sig Dispense Refill  . ACETAMINOPHEN 500 MG PO TABS Oral Take 1,000 mg by mouth every 6 (six) hours as needed. For pain    . ALBUTEROL SULFATE HFA 108 (90 BASE) MCG/ACT IN AERS Inhalation Inhale 2 puffs into the  lungs every 6 (six) hours as needed. For shortness of breath    . IBUPROFEN 400 MG PO TABS Oral Take 400 mg by mouth every 6 (six) hours as needed. For pain relief    . LORAZEPAM 1 MG PO TABS Oral Take 1 mg by mouth every 8 (eight) hours as needed.    . TRAZODONE HCL 100 MG PO TABS Oral Take 1 tablet (100 mg total) by mouth at bedtime. For depression 30 tablet 0    BP 138/85  Pulse 96  Temp(Src) 98.8 F (37.1 C) (Oral)  Resp 18  SpO2 99%  Physical Exam  Nursing note and vitals reviewed. Constitutional: She appears well-developed and well-nourished. No distress.  HENT:  Head: Normocephalic and atraumatic.  Neck: Normal range of motion.  Cardiovascular: Normal rate.   Pulmonary/Chest: Effort normal.  Musculoskeletal: Normal range of motion.       Firm nodule under the skin to posterolat deltoid measuring ~0.5 cm, which does not feel fluctuant; no surrounding induration or cellulitis. Pt tender to palpation over this area and to post deltoid. FROM at shoulder. NVI distally.  Neurological: She is alert.  Skin: Skin is warm and dry. She is not diaphoretic.  Psychiatric: She has a normal mood and affect.  ED Course  Procedures (including critical care time)  Labs Reviewed - No data to display No results found.   1. Injection Site Reaction       MDM  Pt with likely injection site reaction from IM injection. Area does not feel particularly fluctuant at this time; I offered to examine area with ultrasound to evaluate further but pt declined. Will give abx coverage for poss early cellulitis/abscess. Advised warm compresses. Return precautions discussed.        Grant Fontana, [place] 07/17/11 1810

## 2011-07-16 NOTE — Discharge Instructions (Signed)
This is likely a reaction to the site where you got your injection. You have been given some medicine for pain and an antibiotic. Take ALL of the antibiotic until it's gone. Put warm compresses over the area to help draw out any inflammation in the area. Return to the ED with high fever, chills or any other worrisome symptoms.  RESOURCE GUIDE  Dental Problems  Patients with Medicaid: [facility] [phone] [address]                                           [phone] [address] Phone:  [phone]                                                  Phone:  [phone]  If unable to pay or uninsured, contact:  [facility] or [facility]. to become qualified for the adult dental clinic.  Chronic Pain Problems Contact [facility]  [phone] Patients need to be referred by their primary care doctor.  Insufficient Money for Medicine Contact [employer]:  call "211" or [facility] [phone].  No Primary Care Doctor Call [facility]  [phone] Other agencies that provide inexpensive medical care    [facility]  [phone]    [facility]  [phone]    [facility]  [phone]    [facility]  [phone]    [facility]  [phone]    [facility]  [phone]  Psychological Services [facility]  [phone] [facility]  [phone] [facility]   [phone] (emergency services [phone])  Substance Abuse Resources [facility]  [phone] [facility] [phone] [facility] [place] [phone] Floydene Flock [phone] Residential & Outpatient Substance Abuse Program  [phone]  Abuse/Neglect [facility] Child Abuse Hotline [phone] [facility] Child Abuse Hotline [phone]) [phone] (After Hours)  Emergency Shelter [facility] [phone]  Maternity  Homes [facility] at [facility] [place] [facility] [place] [phone] [facility] [phone]  MRSA Hotline #:   929-109-1785    [facility] Resources  [facility]     [employer]                          [facility] Dept. [address] [place]                       [address]      [address]  [place]                                                Cristobal Goldmann Phone:  [phone]  Phone:  [phone]                 Phone:  [phone]  [facility] Phone:  [phone]  [place] Child Abuse Hotline [phone]) [phone] (After Hours)   

## 2011-07-19 NOTE — ED Provider Notes (Signed)
Medical screening examination/treatment/procedure(s) were performed by non-physician practitioner and as supervising physician I was immediately available for consultation/collaboration.   Kazia Grisanti, MD 07/19/11 0701 

## 2011-08-01 ENCOUNTER — Encounter (HOSPITAL_COMMUNITY): Payer: Self-pay | Admitting: *Deleted

## 2011-08-01 ENCOUNTER — Emergency Department (HOSPITAL_COMMUNITY)
Admission: EM | Admit: 2011-08-01 | Discharge: 2011-08-01 | Disposition: A | Discharge status: Home / Self Care | Payer: Medicaid Other | Attending: Emergency Medicine | Admitting: Emergency Medicine

## 2011-08-01 DIAGNOSIS — I10 Essential (primary) hypertension: Secondary | ICD-10-CM | POA: Insufficient documentation

## 2011-08-01 DIAGNOSIS — F259 Schizoaffective disorder, unspecified: Secondary | ICD-10-CM | POA: Insufficient documentation

## 2011-08-01 DIAGNOSIS — F17200 Nicotine dependence, unspecified, uncomplicated: Secondary | ICD-10-CM | POA: Insufficient documentation

## 2011-08-01 DIAGNOSIS — M549 Dorsalgia, unspecified: Secondary | ICD-10-CM | POA: Insufficient documentation

## 2011-08-01 DIAGNOSIS — Z8543 Personal history of malignant neoplasm of ovary: Secondary | ICD-10-CM | POA: Insufficient documentation

## 2011-08-01 HISTORY — DX: Calculus of kidney: N20.0

## 2011-08-01 MED ORDER — METHOCARBAMOL 500 MG PO TABS: 500.0000 mg | ORAL_TABLET | Freq: Three times a day (TID) | ORAL | Status: AC

## 2011-08-01 MED ORDER — OXYCODONE-ACETAMINOPHEN 5-325 MG PO TABS: 1.0000 | ORAL_TABLET | Freq: Four times a day (QID) | ORAL | Status: AC | PRN

## 2011-08-01 MED ORDER — OXYCODONE-ACETAMINOPHEN 5-325 MG PO TABS
1.0000 | ORAL_TABLET | Freq: Once | ORAL | Status: AC
  Administered 2011-08-01: 1 via ORAL
  Filled 2011-08-01: qty 1

## 2011-08-01 NOTE — ED Provider Notes (Signed)
Medical screening examination/treatment/procedure(s) were performed by non-physician practitioner and as supervising physician I was immediately available for consultation/collaboration.  Flint Melter, MD 08/01/11 [phone]
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
## 2011-08-28 DIAGNOSIS — Z046 Encounter for general psychiatric examination, requested by authority: Secondary | ICD-10-CM | POA: Insufficient documentation

## 2011-08-28 DIAGNOSIS — IMO0002 Reserved for concepts with insufficient information to code with codable children: Secondary | ICD-10-CM | POA: Insufficient documentation

## 2011-08-30 ENCOUNTER — Telehealth (HOSPITAL_COMMUNITY): Payer: Self-pay | Admitting: Licensed Clinical Social Worker

## 2011-08-31 ENCOUNTER — Telehealth (HOSPITAL_COMMUNITY): Payer: Self-pay | Admitting: Licensed Clinical Social Worker

## 2011-09-26 ENCOUNTER — Emergency Department (HOSPITAL_COMMUNITY)
Admission: EM | Admit: 2011-09-26 | Discharge: 2011-09-27 | Disposition: A | Discharge status: Home / Self Care | Payer: Medicaid Other | Attending: Emergency Medicine | Admitting: Emergency Medicine

## 2011-09-26 ENCOUNTER — Encounter (HOSPITAL_COMMUNITY): Payer: Self-pay | Admitting: Emergency Medicine

## 2011-09-26 DIAGNOSIS — F17200 Nicotine dependence, unspecified, uncomplicated: Secondary | ICD-10-CM | POA: Insufficient documentation

## 2011-09-26 DIAGNOSIS — I10 Essential (primary) hypertension: Secondary | ICD-10-CM | POA: Insufficient documentation

## 2011-09-26 DIAGNOSIS — R109 Unspecified abdominal pain: Secondary | ICD-10-CM | POA: Insufficient documentation

## 2011-09-26 DIAGNOSIS — G40909 Epilepsy, unspecified, not intractable, without status epilepticus: Secondary | ICD-10-CM | POA: Insufficient documentation

## 2011-09-26 DIAGNOSIS — Z8543 Personal history of malignant neoplasm of ovary: Secondary | ICD-10-CM | POA: Insufficient documentation

## 2011-09-26 DIAGNOSIS — F259 Schizoaffective disorder, unspecified: Secondary | ICD-10-CM | POA: Insufficient documentation

## 2011-09-26 LAB — URINALYSIS, ROUTINE W REFLEX MICROSCOPIC
Bilirubin Urine: NEGATIVE
Glucose, UA: NEGATIVE mg/dL
Hgb urine dipstick: NEGATIVE
Ketones, ur: NEGATIVE mg/dL
Protein, ur: NEGATIVE mg/dL
Urobilinogen, UA: 1 mg/dL (ref 0.0–1.0)

## 2011-09-26 MED ORDER — MORPHINE SULFATE 4 MG/ML IJ SOLN
4.0000 mg | Freq: Once | INTRAMUSCULAR | Status: AC
  Administered 2011-09-26: 4 mg via INTRAVENOUS
  Filled 2011-09-26: qty 1

## 2011-09-26 MED ORDER — MORPHINE SULFATE 4 MG/ML IJ SOLN
4.0000 mg | Freq: Once | INTRAMUSCULAR | Status: AC
  Administered 2011-09-27: 4 mg via INTRAVENOUS
  Filled 2011-09-26: qty 1

## 2011-09-26 MED ORDER — SODIUM CHLORIDE 0.9 % IV BOLUS (SEPSIS)
1000.0000 mL | Freq: Once | INTRAVENOUS | Status: AC
  Administered 2011-09-26: 1000 mL via INTRAVENOUS

## 2011-09-26 NOTE — ED Notes (Signed)
Pt states she began having left flank pain three days ago. Followed by N/V/D for the last two days. Pt states it "feels like I have another kidney stone."

## 2011-09-27 ENCOUNTER — Emergency Department (HOSPITAL_COMMUNITY): Payer: Medicaid Other

## 2011-09-27 MED ORDER — OXYCODONE-ACETAMINOPHEN 5-325 MG PO TABS: 1.0000 | ORAL_TABLET | ORAL | Status: AC | PRN

## 2011-09-27 NOTE — ED Provider Notes (Addendum)
History     CSN: [id number]  Arrival date & time 09/26/11  2131   First MD Initiated Contact with Patient 09/26/11 2352      Chief Complaint  Patient presents with  . Flank Pain    The history is provided by the patient.   patient reports developing severe left flank pain 3 days ago that has been constant.  Her pain radiates towards her left groin  She has had some nausea vomiting and diarrhea.  She denies vomiting in the past 12 hours.  She reports her pain is mild to moderate.  She has a known history of left sided nephrolithiasis.  She does report she recently underwent lithotripsy at the urology office.  She reports blood in her urine without dysuria or urinary frequency.  She denies fevers or chills.  She reports her pain is not improved with over-the-counter medications.  She has allergies to Lortab tramadol and ketorolac  Past Medical History  Diagnosis Date  . 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 Brain tumor   . Schizoaffective disorder   . Anxiety   . Seizures   . Hypertension   . Depression   . Cancer 1991    ovarian  . Kidney stones   . Kidney stone     Past Surgical History  Procedure Date  . Abdominal hysterectomy     partial  . Kidney stone removal   . Appendectomy     Family History  Problem Relation Age of Onset  . Diabetes type II Other     History  Substance Use Topics  . Smoking status: Current Everyday Smoker -- 1.0 packs/day  . Smokeless tobacco: Not on file  . Alcohol Use: No    OB History    Grav Para Term Preterm Abortions TAB SAB Ect Mult Living                  Review of Systems  Genitourinary: Positive for flank pain.  All other systems reviewed and are negative.    Allergies  Ketorolac tromethamine; Lortab; and Tramadol  Home Medications   Current Outpatient Rx  Name Route Sig Dispense Refill  . ACETAMINOPHEN 500 MG PO TABS Oral Take 1,000 mg by mouth every 6 (six) hours as needed. For pain    . LORAZEPAM  1 MG PO TABS Oral Take 1 mg by mouth every 8 (eight) hours as needed. anxiety    . TRAZODONE HCL 100 MG PO TABS Oral Take 1 tablet (100 mg total) by mouth at bedtime. For depression 30 tablet 0    BP 128/75  Pulse 94  Temp 98.4 F (36.9 C) (Oral)  Resp 16  SpO2 95%  Physical Exam  Nursing note and vitals reviewed. Constitutional: She is oriented to person, place, and time. She appears well-developed and well-nourished. No distress.  HENT:  Head: Normocephalic and atraumatic.  Eyes: EOM are normal.  Neck: Normal range of motion.  Cardiovascular: Normal rate, regular rhythm and normal heart sounds.   Pulmonary/Chest: Effort normal and breath sounds normal.  Abdominal: Soft. She exhibits no distension. There is no tenderness.  Genitourinary:       No CVA tenderness  Musculoskeletal: Normal range of motion.  Neurological: She is alert and oriented to person, place, and time.  Skin: Skin is warm and dry.  Psychiatric: She has a normal mood and affect. Judgment normal.    ED Course  Procedures (including critical care time)   Labs Reviewed  URINALYSIS, ROUTINE W REFLEX MICROSCOPIC  PREGNANCY, URINE   Dg Abd 1 View  09/27/2011  *RADIOLOGY REPORT*  Clinical Data: Left flank pain  ABDOMEN - 1 VIEW  Comparison: 09/14/2011 radiograph  Findings: Bowel gas pattern is nonobstructive.  No acute osseous finding.  Surgical clip projects over the right lower quadrant.  2 mm calcific density and 4 mm calcific density projecting over the left lower quadrant, nonspecific.  IMPRESSION: Nonobstructive bowel gas pattern.  2 and 4 mm calcific densities projecting over the left lower quadrant, nonspecific.  Lower than expected for the kidney and more lateral than I would expect to be within the ureter however not entirely excluded.  Original Report Authenticated By: Waneta Martins, M.D.    I personally reviewed the imaging tests through PACS system  I reviewed available ER/hospitalization records  thought the EMR   1. Left flank pain       MDM  Patient's pain could represent left-sided ureteral lithiasis.  Her pain seems to be improved at this time.  There is no hematuria noted.  She has no signs of urinary tract infection.  Given the patient's significant allergy list I find it difficult to provide her any pain medicine at home besides ibuprofen and Tylenol.  She'll continue to take these and followup with her Dr. concrete.   Lyanne Co, MD 09/27/11 0126  The patient reports she's tolerated Percocet in the past without difficulty  Lyanne Co, MD 09/27/11 0130

## 2011-11-18 ENCOUNTER — Emergency Department (HOSPITAL_COMMUNITY)
Admission: EM | Admit: 2011-11-18 | Discharge: 2011-11-18 | Disposition: A | Discharge status: Home / Self Care | Payer: Medicaid Other | Attending: Emergency Medicine | Admitting: Emergency Medicine

## 2011-11-18 ENCOUNTER — Emergency Department (HOSPITAL_COMMUNITY): Payer: Medicaid Other

## 2011-11-18 DIAGNOSIS — N23 Unspecified renal colic: Secondary | ICD-10-CM | POA: Insufficient documentation

## 2011-11-18 DIAGNOSIS — I10 Essential (primary) hypertension: Secondary | ICD-10-CM | POA: Insufficient documentation

## 2011-11-18 DIAGNOSIS — Z79899 Other long term (current) drug therapy: Secondary | ICD-10-CM | POA: Insufficient documentation

## 2011-11-18 LAB — CBC WITH DIFFERENTIAL/PLATELET
Basophils Relative: 0 % (ref 0–1)
Eosinophils Relative: 1 % (ref 0–5)
HCT: 34.1 % — ABNORMAL LOW (ref 36.0–46.0)
Hemoglobin: 11.1 g/dL — ABNORMAL LOW (ref 12.0–15.0)
MCH: 30.2 pg (ref 26.0–34.0)
MCHC: 32.6 g/dL (ref 30.0–36.0)
MCV: 92.9 fL (ref 78.0–100.0)
Monocytes Absolute: 0.4 10*3/uL (ref 0.1–1.0)
Monocytes Relative: 5 % (ref 3–12)
Neutro Abs: 5.1 10*3/uL (ref 1.7–7.7)

## 2011-11-18 LAB — URINALYSIS, ROUTINE W REFLEX MICROSCOPIC
Bilirubin Urine: NEGATIVE
Glucose, UA: NEGATIVE mg/dL
Ketones, ur: NEGATIVE mg/dL
Nitrite: NEGATIVE
pH: 6.5 (ref 5.0–8.0)

## 2011-11-18 LAB — BASIC METABOLIC PANEL
BUN: 15 mg/dL (ref 6–23)
Chloride: 102 mEq/L (ref 96–112)
Creatinine, Ser: 0.96 mg/dL (ref 0.50–1.10)
GFR calc Af Amer: >90 mL/min (ref >90)

## 2011-11-18 LAB — URINE MICROSCOPIC-ADD ON

## 2011-11-18 MED ORDER — ONDANSETRON HCL 4 MG/2ML IJ SOLN
4.0000 mg | Freq: Once | INTRAMUSCULAR | Status: AC
  Administered 2011-11-18: 4 mg via INTRAVENOUS
  Filled 2011-11-18: qty 2

## 2011-11-18 MED ORDER — HYDROCODONE-ACETAMINOPHEN 5-325 MG PO TABS
1.0000 | ORAL_TABLET | Freq: Once | ORAL | Status: AC
  Administered 2011-11-18: 1 via ORAL
  Filled 2011-11-18: qty 1

## 2011-11-18 MED ORDER — DIPHENHYDRAMINE HCL 50 MG/ML IJ SOLN
25.0000 mg | Freq: Once | INTRAMUSCULAR | Status: AC
  Administered 2011-11-18: 25 mg via INTRAVENOUS
  Filled 2011-11-18: qty 1

## 2011-11-18 MED ORDER — OXYCODONE-ACETAMINOPHEN 5-325 MG PO TABS: 1.0000 | ORAL_TABLET | Freq: Four times a day (QID) | ORAL | Status: DC | PRN

## 2011-11-18 MED ORDER — METHYLPREDNISOLONE SODIUM SUCC 125 MG IJ SOLR
125.0000 mg | Freq: Once | INTRAMUSCULAR | Status: AC
  Administered 2011-11-18: 125 mg via INTRAVENOUS
  Filled 2011-11-18: qty 2

## 2011-11-18 MED ORDER — HYDROMORPHONE HCL PF 1 MG/ML IJ SOLN
1.0000 mg | Freq: Once | INTRAMUSCULAR | Status: AC
Start: 2011-11-18 — End: 2011-11-18
  Administered 2011-11-18: 1 mg via INTRAVENOUS
  Filled 2011-11-18: qty 1

## 2011-11-18 NOTE — ED Notes (Signed)
[id number]<YQ> Expected date:<BR> Expected time:<BR> Means of arrival:<BR> Comments:<BR> Medic Flank Pain

## 2011-11-18 NOTE — ED Provider Notes (Signed)
Medical screening examination/treatment/procedure(s) were performed by non-physician practitioner and as supervising physician I was immediately available for consultation/collaboration.  Sunnie Nielsen, MD 11/18/11 [phone]

## 2011-11-18 NOTE — ED Provider Notes (Signed)
History     CSN: [id number]  Arrival date & time 11/18/11  0001   First MD Initiated Contact with Patient 11/18/11 0013      Chief Complaint  Patient presents with  . Abdominal Pain    with nausea and ? kidney stones    (Consider location/radiation/quality/duration/timing/severity/associated sxs/prior treatment) HPI   The history is provided by the patient.  patient reports developing severe left flank pain for 1 month that has been constant. Her pain radiates towards her left groin She has had some nausea vomiting and diarrhea. She denies vomiting in the past 12 hours. She reports her pain is mild to moderate. She has a known history of left sided nephrolithiasis. She does report she recently underwent lithotripsy at the urology office. She reports blood in her urine without dysuria or urinary frequency. She denies fevers or chills. She reports her pain is not improved with over-the-counter medications. She has allergies to Lortab tramadol and ketorolac   She has been referred to [facility] who saw her in march of 2013 and they said that she needed a referral from her PCP. As she is a [facility] patient and no longer has a PCP they refused to see her, according to the pt.  Past Medical History  Diagnosis Date  . 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 Brain tumor   . Schizoaffective disorder   . Anxiety   . Seizures   . Hypertension   . Depression   . Cancer 1991    ovarian  . Kidney stones   . Kidney stone     Past Surgical History  Procedure Date  . Abdominal hysterectomy     partial  . Kidney stone removal   . Appendectomy     Family History  Problem Relation Age of Onset  . Diabetes type II Other     History  Substance Use Topics  . Smoking status: Current Every Day Smoker -- 1.0 packs/day  . Smokeless tobacco: Not on file  . Alcohol Use: No    OB History    Grav Para Term Preterm Abortions TAB SAB Ect Mult Living                   Review of Systems   Review of Systems  Gen: no weight loss, fevers, chills, night sweats  Eyes: no discharge or drainage, no occular pain or visual changes  Nose: no epistaxis or rhinorrhea  Mouth: no dental pain, no sore throat  Neck: no neck pain  Lungs:No wheezing, coughing or hemoptysis CV: no chest pain, palpitations, dependent edema or orthopnea  Abd: no abdominal pain, nausea, vomiting  GU: no dysuria or gross hematuria, + flank pain MSK:  No abnormalities  Neuro: no headache, no focal neurologic deficits  Skin: no abnormalities Psyche: negative.    Allergies  Ketorolac tromethamine; Lortab; and Tramadol  Home Medications   Current Outpatient Rx  Name Route Sig Dispense Refill  . CLONAZEPAM 0.5 MG PO TABS Oral Take 0.5 mg by mouth 3 (three) times daily as needed. Anxiety    . HALOPERIDOL DECANOATE 100 MG/ML IM SOLN Intramuscular Inject 150 mg into the muscle every 28 (twenty-eight) days.    . OXYCODONE-ACETAMINOPHEN 10-325 MG PO TABS Oral Take 1 tablet by mouth every 8 (eight) hours as needed. Pain    . TRAZODONE HCL 100 MG PO TABS Oral Take 100 mg by mouth at bedtime.    . OXYCODONE-ACETAMINOPHEN 5-325 MG PO TABS  Oral Take 1 tablet by mouth every 6 (six) hours as needed for pain. 8 tablet 0    BP 153/79  Pulse 83  Temp 98 F (36.7 C) (Oral)  Resp 18  SpO2 97%  Physical Exam  Nursing note and vitals reviewed. Constitutional: She appears well-developed and well-nourished. No distress.  HENT:  Head: Normocephalic and atraumatic.  Eyes: Pupils are equal, round, and reactive to light.  Neck: Normal range of motion. Neck supple.  Cardiovascular: Normal rate and regular rhythm.   Pulmonary/Chest: Effort normal.  Abdominal: Soft. She exhibits no distension and no mass. There is tenderness (left flank tenderness). There is no rebound and no guarding.  Neurological: She is alert.  Skin: Skin is warm and dry.    ED Course  Procedures (including critical care  time)  Labs Reviewed  CBC WITH DIFFERENTIAL - Abnormal; Notable for the following:    RBC 3.67 (*)     Hemoglobin 11.1 (*)     HCT 34.1 (*)     All other components within normal limits  BASIC METABOLIC PANEL - Abnormal; Notable for the following:    Glucose, Bld 107 (*)     GFR calc non Af Amer 81 (*)     All other components within normal limits  URINALYSIS, ROUTINE W REFLEX MICROSCOPIC - Abnormal; Notable for the following:    APPearance CLOUDY (*)     Leukocytes, UA SMALL (*)     All other components within normal limits  URINE MICROSCOPIC-ADD ON - Abnormal; Notable for the following:    Squamous Epithelial / LPF MANY (*)     Bacteria, UA MANY (*)     All other components within normal limits  PREGNANCY, URINE   US Renal  11/18/2011  *RADIOLOGY REPORT*  Clinical Data:  Abdominal pain  RENAL/URINARY TRACT ULTRASOUND COMPLETE  Comparison:  03/12/2011  Findings:  Right Kidney:  Measures 12.9 cm.  Normal in size and parenchymal echogenicity.  No evidence of mass or hydronephrosis.  Left Kidney:  Measures 12.3 cm.  Normal in size and parenchymal echogenicity.  No evidence of mass or hydronephrosis.  Bladder:  Appears collapsed.  IMPRESSION: Normal study.   Original Report Authenticated By: Rosealee Albee, M.D.      1. Renal colic on left side       MDM  Is noted on [facility] ultrasounds. The patient does not have a UTI. Her labs are normal with normal vital signs. The patient needs to followup with [facility] serology for renal colic. Also patient given resources for primary care providers. She has Medicaid so getting PCP care should be available.  Pt has been advised of the symptoms that warrant their return to the ED. Patient has voiced understanding and has agreed to follow-up with the PCP or specialist.         Dorthula Matas, PA 11/18/11 [phone]

## 2011-11-18 NOTE — ED Notes (Signed)
ems- hx of kidney stone, said dx with kidney stone - f/u with urologist , has not done that. Does not have PCP. Needs  Referral to consult wit a urologist. . Complaints of abdominal pain, nausea  10/10 gave total of fentanyl, and zofran. Vs- 150/100, 92hr, 18rr. 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  Currently taking haldol injection.

## 2011-11-18 NOTE — ED Notes (Addendum)
Patient received 1 vicodin, allergy to lortab, attending PA aware. Pharmacy referenced. Patient received benadryl and solumedrol via iv. Will monitor closely... Patient has compliants of itching. No additional allergic symptoms  At 0345 gave patient solumedrol . 125mg . Patient currently not complaint of any issues. Will cont monitor

## 2011-11-22 ENCOUNTER — Emergency Department (HOSPITAL_COMMUNITY)
Admission: EM | Admit: 2011-11-22 | Discharge: 2011-11-22 | Disposition: A | Discharge status: Home / Self Care | Payer: Medicaid Other | Attending: Emergency Medicine | Admitting: Emergency Medicine

## 2011-11-22 ENCOUNTER — Emergency Department (HOSPITAL_COMMUNITY): Payer: Medicaid Other

## 2011-11-22 ENCOUNTER — Encounter (HOSPITAL_COMMUNITY): Payer: Self-pay | Admitting: *Deleted

## 2011-11-22 DIAGNOSIS — I10 Essential (primary) hypertension: Secondary | ICD-10-CM | POA: Insufficient documentation

## 2011-11-22 DIAGNOSIS — I899 Noninfective disorder of lymphatic vessels and lymph nodes, unspecified: Secondary | ICD-10-CM | POA: Insufficient documentation

## 2011-11-22 DIAGNOSIS — N390 Urinary tract infection, site not specified: Secondary | ICD-10-CM | POA: Insufficient documentation

## 2011-11-22 DIAGNOSIS — N200 Calculus of kidney: Secondary | ICD-10-CM | POA: Insufficient documentation

## 2011-11-22 DIAGNOSIS — Z79899 Other long term (current) drug therapy: Secondary | ICD-10-CM | POA: Insufficient documentation

## 2011-11-22 LAB — URINALYSIS, ROUTINE W REFLEX MICROSCOPIC
Bilirubin Urine: NEGATIVE
Glucose, UA: NEGATIVE mg/dL
Hgb urine dipstick: NEGATIVE
Ketones, ur: NEGATIVE mg/dL

## 2011-11-22 LAB — URINE MICROSCOPIC-ADD ON

## 2011-11-22 MED ORDER — NITROFURANTOIN MONOHYD MACRO 100 MG PO CAPS: 100.0000 mg | ORAL_CAPSULE | Freq: Two times a day (BID) | ORAL | Status: DC

## 2011-11-22 MED ORDER — HYDROMORPHONE HCL PF 2 MG/ML IJ SOLN
2.0000 mg | Freq: Once | INTRAMUSCULAR | Status: DC
  Filled 2011-11-22: qty 1

## 2011-11-22 MED ORDER — HYDROMORPHONE HCL PF 2 MG/ML IJ SOLN
2.0000 mg | Freq: Once | INTRAMUSCULAR | Status: AC
  Administered 2011-11-22: 2 mg via INTRAMUSCULAR

## 2011-11-22 MED ORDER — FENTANYL CITRATE 0.05 MG/ML IJ SOLN
50.0000 ug | Freq: Once | INTRAMUSCULAR | Status: DC
  Filled 2011-11-22: qty 2

## 2011-11-22 MED ORDER — ONDANSETRON 4 MG PO TBDP
4.0000 mg | ORAL_TABLET | Freq: Once | ORAL | Status: AC
  Administered 2011-11-22: 4 mg via ORAL
  Filled 2011-11-22: qty 1

## 2011-11-22 MED ORDER — NITROFURANTOIN MONOHYD MACRO 100 MG PO CAPS
100.0000 mg | ORAL_CAPSULE | Freq: Once | ORAL | Status: AC
  Administered 2011-11-22: 100 mg via ORAL
  Filled 2011-11-22: qty 1

## 2011-11-22 NOTE — ED Notes (Signed)
Per EMS: pt c/o of left flank pain. Pt was seen here Saturday for same complaint. Pt was given 8 percocet, pt is now out of pain medication. Pt was instructed to follow up with [facility], but was unable to schedule an appointment soon enough. Pt states she hasn't eaten since Tuesday, pt believes she is dehydrated. Pt is A&Ox4. Ambulatory on scene and ambulatory to ED bed

## 2011-11-22 NOTE — ED Provider Notes (Signed)
History     CSN: [id number]  Arrival date & time 11/22/11  0040   First MD Initiated Contact with Patient 11/22/11 [phone]      Chief Complaint  Patient presents with  . Flank Pain    (Consider location/radiation/quality/duration/timing/severity/associated sxs/prior treatment) HPI This is a 26 year old black female with a history of nephrolithiasis. She was seen here recently for left flank pain that began 5 days ago. Original ultrasound was obtained which showed no evidence of hydronephrosis. She was diagnosed with renal colic and discharged with 8 Percocet. She states the pain returned yesterday evening and is moderate to severe. It is located in the left flank with a little radiation to the abdomen. It was associated with nausea, vomiting and diarrhea. She denies fever or chills. She states she has not eaten in 2 days.   Past Medical History  Diagnosis Date  . 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 Brain tumor   . Schizoaffective disorder   . Anxiety   . Seizures   . Hypertension   . Depression   . Cancer 1991    ovarian  . Kidney stones   . Kidney stone     Past Surgical History  Procedure Date  . Abdominal hysterectomy     partial  . Kidney stone removal   . Appendectomy     Family History  Problem Relation Age of Onset  . Diabetes type II Other     History  Substance Use Topics  . Smoking status: Current Every Day Smoker -- 1.0 packs/day  . Smokeless tobacco: Not on file  . Alcohol Use: No    OB History    Grav Para Term Preterm Abortions TAB SAB Ect Mult Living                  Review of Systems  All other systems reviewed and are negative.    Allergies  Ketorolac tromethamine; Lortab; and Tramadol  Home Medications   Current Outpatient Rx  Name Route Sig Dispense Refill  . CLONAZEPAM 0.5 MG PO TABS Oral Take 0.5 mg by mouth 3 (three) times daily as needed. Anxiety    . OXYCODONE-ACETAMINOPHEN 5-325 MG PO TABS Oral Take 1 tablet  by mouth every 6 (six) hours as needed for pain. 8 tablet 0  . TRAZODONE HCL 100 MG PO TABS Oral Take 100 mg by mouth at bedtime.      BP 119/74  Pulse 82  Temp 98.5 F (36.9 C) (Oral)  Resp 20  SpO2 98%  Physical Exam General: Well-developed, morbidly obese female in no acute distress; appearance consistent with age of record HENT: normocephalic, atraumatic Eyes: pupils equal round and reactive to light; extraocular muscles intact Neck: supple Heart: regular rate and rhythm Lungs: clear to auscultation bilaterally Abdomen: soft; obese; nontender; bowel sounds present GU: Left CVA tenderness Extremities: No deformity; full range of motion; pulses normal Neurologic: Awake, alert; motor function intact in all extremities and symmetric; no facial droop Skin: Warm and dry     ED Course  Procedures (including critical care time)     MDM   Nursing notes and vitals signs, including pulse oximetry, reviewed.  Summary of this visit's results, reviewed by myself:  Labs:  Results for orders placed during the hospital encounter of 11/22/11  URINALYSIS, ROUTINE W REFLEX MICROSCOPIC      Component Value Range   Color, Urine YELLOW  YELLOW   APPearance CLOUDY (*) CLEAR   Specific Gravity,  Urine 1.027  1.005 - 1.030   pH 6.5  5.0 - 8.0   Glucose, UA NEGATIVE  NEGATIVE mg/dL   Hgb urine dipstick NEGATIVE  NEGATIVE   Bilirubin Urine NEGATIVE  NEGATIVE   Ketones, ur NEGATIVE  NEGATIVE mg/dL   Protein, ur NEGATIVE  NEGATIVE mg/dL   Urobilinogen, UA 1.0  0.0 - 1.0 mg/dL   Nitrite NEGATIVE  NEGATIVE   Leukocytes, UA MODERATE (*) NEGATIVE  URINE MICROSCOPIC-ADD ON      Component Value Range   Squamous Epithelial / LPF MANY (*) RARE   WBC, UA 11-20  <3 WBC/hpf   RBC / HPF 0-2  <3 RBC/hpf   Bacteria, UA MANY (*) RARE   Crystals CA OXALATE CRYSTALS (*) NEGATIVE   Urine-Other MUCOUS PRESENT      Imaging Studies: Ct Abdomen Pelvis Wo Contrast  11/22/2011  *RADIOLOGY REPORT*   Clinical Data: Left flank pain for several days.  History of appendectomy and partial hysterectomy.  History of kidney stones.  CT ABDOMEN AND PELVIS WITHOUT CONTRAST  Technique:  Multidetector CT imaging of the abdomen and pelvis was performed following the standard protocol without intravenous contrast.  Comparison: 04/18/2011.  Findings: Lung bases clear. Unenhanced CT was performed per clinician order.  Lack of IV contrast limits sensitivity and specificity, especially for evaluation of abdominal/pelvic solid viscera.  Liver, gallbladder, pancreas, common bile duct and spleen appear normal.  No renal calculi.  The left ureter is normal. Right ureter is also normal.  Atrophic uterus.  Calcifications over the dome of the bladder are likely postsurgical.  Compared to the prior exam, previously seen 6 mm left renal collecting system calculus has passed.  The adrenal glands appear normal.  Stomach and small bowel appear normal.  No free air or free fluid.  Lower abdominal surgical scar.  Stable calcification along the left pelvic side wall.  Bilateral SI joint degenerative disease is present.  IMPRESSION:  1.  No acute abnormality. 2.  Interval passage of 6 mm left renal calculus.   Original Report Authenticated By: Andreas Newport, M.D.             Hanley Seamen, MD 11/22/11 [phone]

## 2011-11-22 NOTE — ED Notes (Signed)
Pt reports taking her last percocet at 1200 noon yesterday

## 2011-11-22 NOTE — ED Notes (Signed)
Bed:WA23<BR> Expected date:<BR> Expected time:<BR> Means of arrival:<BR> Comments:<BR> EMS

## 2011-11-24 LAB — URINE CULTURE: Colony Count: >=100000

## 2011-12-23 ENCOUNTER — Emergency Department (HOSPITAL_COMMUNITY)
Admission: EM | Admit: 2011-12-23 | Discharge: 2011-12-23 | Disposition: A | Discharge status: Home / Self Care | Payer: Medicaid Other | Attending: Emergency Medicine | Admitting: Emergency Medicine

## 2011-12-23 ENCOUNTER — Encounter (HOSPITAL_COMMUNITY): Payer: Self-pay | Admitting: *Deleted

## 2011-12-23 DIAGNOSIS — F419 Anxiety disorder, unspecified: Secondary | ICD-10-CM

## 2011-12-23 DIAGNOSIS — I10 Essential (primary) hypertension: Secondary | ICD-10-CM | POA: Insufficient documentation

## 2011-12-23 DIAGNOSIS — F3289 Other specified depressive episodes: Secondary | ICD-10-CM | POA: Insufficient documentation

## 2011-12-23 DIAGNOSIS — F329 Major depressive disorder, single episode, unspecified: Secondary | ICD-10-CM | POA: Insufficient documentation

## 2011-12-23 DIAGNOSIS — R443 Hallucinations, unspecified: Secondary | ICD-10-CM | POA: Insufficient documentation

## 2011-12-23 DIAGNOSIS — N200 Calculus of kidney: Secondary | ICD-10-CM | POA: Insufficient documentation

## 2011-12-23 DIAGNOSIS — F209 Schizophrenia, unspecified: Secondary | ICD-10-CM | POA: Insufficient documentation

## 2011-12-23 DIAGNOSIS — Z8543 Personal history of malignant neoplasm of ovary: Secondary | ICD-10-CM | POA: Insufficient documentation

## 2011-12-23 DIAGNOSIS — F259 Schizoaffective disorder, unspecified: Secondary | ICD-10-CM | POA: Insufficient documentation

## 2011-12-23 DIAGNOSIS — Z8669 Personal history of other diseases of the nervous system and sense organs: Secondary | ICD-10-CM | POA: Insufficient documentation

## 2011-12-23 DIAGNOSIS — F17200 Nicotine dependence, unspecified, uncomplicated: Secondary | ICD-10-CM | POA: Insufficient documentation

## 2011-12-23 DIAGNOSIS — F411 Generalized anxiety disorder: Secondary | ICD-10-CM | POA: Insufficient documentation

## 2011-12-23 LAB — RAPID URINE DRUG SCREEN, HOSP PERFORMED
Barbiturates: NOT DETECTED
Benzodiazepines: NOT DETECTED
Cocaine: POSITIVE — AB
Tetrahydrocannabinol: POSITIVE — AB

## 2011-12-23 LAB — COMPREHENSIVE METABOLIC PANEL
ALT: 16 U/L (ref 0–35)
Albumin: 3.6 g/dL (ref 3.5–5.2)
Alkaline Phosphatase: 82 U/L (ref 39–117)
BUN: 9 mg/dL (ref 6–23)
Calcium: 9.4 mg/dL (ref 8.4–10.5)
Potassium: 3.9 mEq/L (ref 3.5–5.1)
Sodium: 138 mEq/L (ref 135–145)
Total Protein: 7.3 g/dL (ref 6.0–8.3)

## 2011-12-23 LAB — ETHANOL: Alcohol, Ethyl (B): <11 mg/dL (ref 0–11)

## 2011-12-23 LAB — CBC
MCHC: 32.8 g/dL (ref 30.0–36.0)
RDW: 12.9 % (ref 11.5–15.5)

## 2011-12-23 LAB — SALICYLATE LEVEL: Salicylate Lvl: <2 mg/dL — ABNORMAL LOW (ref 2.8–20.0)

## 2011-12-23 MED ORDER — ALUM & MAG HYDROXIDE-SIMETH 200-200-20 MG/5ML PO SUSP: 30.0000 mL | ORAL | Status: DC | PRN

## 2011-12-23 MED ORDER — IBUPROFEN 600 MG PO TABS
600.0000 mg | ORAL_TABLET | Freq: Three times a day (TID) | ORAL | Status: DC | PRN
  Administered 2011-12-23: 600 mg via ORAL
  Filled 2011-12-23: qty 1

## 2011-12-23 MED ORDER — NICOTINE 21 MG/24HR TD PT24: 21.0000 mg | MEDICATED_PATCH | Freq: Every day | TRANSDERMAL | Status: DC

## 2011-12-23 MED ORDER — ZOLPIDEM TARTRATE 5 MG PO TABS: 5.0000 mg | ORAL_TABLET | Freq: Every evening | ORAL | Status: DC | PRN

## 2011-12-23 MED ORDER — LORAZEPAM 1 MG PO TABS
1.0000 mg | ORAL_TABLET | Freq: Once | ORAL | Status: AC
  Administered 2011-12-23: 1 mg via ORAL
  Filled 2011-12-23: qty 1

## 2011-12-23 MED ORDER — TRAZODONE HCL 100 MG PO TABS: 100.0000 mg | ORAL_TABLET | Freq: Every day | ORAL | Status: DC

## 2011-12-23 MED ORDER — HALOPERIDOL LACTATE 5 MG/ML IJ SOLN
1.5000 mg | Freq: Once | INTRAMUSCULAR | Status: AC
  Administered 2011-12-23: 1.5 mg via INTRAMUSCULAR
  Filled 2011-12-23: qty 1

## 2011-12-23 MED ORDER — ONDANSETRON HCL 4 MG PO TABS: 4.0000 mg | ORAL_TABLET | Freq: Three times a day (TID) | ORAL | Status: DC | PRN

## 2011-12-23 MED ORDER — QUETIAPINE FUMARATE 25 MG PO TABS: 25.0000 mg | ORAL_TABLET | Freq: Two times a day (BID) | ORAL | Status: DC

## 2011-12-23 MED ORDER — ALPRAZOLAM 1 MG PO TABS: 1.0000 mg | ORAL_TABLET | Freq: Three times a day (TID) | ORAL | Status: DC | PRN

## 2011-12-23 MED ORDER — ACETAMINOPHEN 325 MG PO TABS: 650.0000 mg | ORAL_TABLET | ORAL | Status: DC | PRN

## 2011-12-23 NOTE — BH Assessment (Signed)
Assessment Note   [name] is an 26 y.o. femalewho presents to [facility] with reports of auditory hallucinations.  She states that she went off her psychiatric meds a couple of months ago because she wanted to see if she could.  She has not had her shot, which she normally receives at [place], in several months and within the last 1-2 weeks she began experiencing auditory hallucinations of mumbling and laughter, which she cannot make out but are not command.  She denies any SI or HI and denies any other psychosis.  She states she came to the emergency room to get her shot. This Clinical research associate discussed this possiblity with Tiffany the PA, who will order the patient's shot, and talked to patient about how to get connected back with her services at [facility].  She lives with her cousin and is not currently in danger of harming herself, is not hearing persecutory or command voices, and does not have access to weapons.     Axis I: Schizoaffective Disorder Axis II: Deferred Axis III:  Past Medical History  Diagnosis Date  . 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 Brain tumor   . Schizoaffective disorder   . Anxiety   . Depression   . Cancer 1991    ovarian  . Hypertension   . Kidney stones   . Kidney stone   . Seizures    Axis IV: problems with access to health care services Axis V: 51-60 moderate symptoms  Past Medical History:  Past Medical History  Diagnosis Date  . 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 Brain tumor   . Schizoaffective disorder   . Anxiety   . Depression   . Cancer 1991    ovarian  . Hypertension   . Kidney stones   . Kidney stone   . Seizures     Past Surgical History  Procedure Date  . Abdominal hysterectomy     partial  . Kidney stone removal   . Appendectomy     Family History:  Family History  Problem Relation Age of Onset  . Diabetes type II Other     Social History:  reports that she has been smoking.  She does not have any  smokeless tobacco history on file. She reports that she does not drink alcohol or use illicit drugs.  Additional Social History:  Alcohol / Drug Use History of alcohol / drug use?: No history of alcohol / drug abuse  CIWA: CIWA-Ar BP: 155/103 mmHg Pulse Rate: 79  COWS:    Allergies:  Allergies  Allergen Reactions  . Ketorolac Tromethamine Hives  . Lortab (Hydrocodone-Acetaminophen) Hives  . Tramadol Hives    Home Medications:  (Not in a hospital admission)  OB/GYN Status:  No LMP recorded. Patient has had a hysterectomy.  General Assessment Data Location of Assessment: [facility] Living Arrangements: Other relatives (cousin) Can pt return to current living arrangement?: Yes Admission Status: Voluntary Is patient capable of signing voluntary admission?: Yes Transfer from: Acute Hospital Referral Source: Self/Family/Friend  Education Status Is patient currently in school?: No  Risk to self Suicidal Ideation: No Suicidal Intent: No Is patient at risk for suicide?: No Suicidal Plan?: No Access to Means: No What has been your use of drugs/alcohol within the last 12 months?: currently denies, has history of THC Previous Attempts/Gestures: Yes How many times?: 3  Other Self Harm Risks: unpredictable Triggers for Past Attempts: Hallucinations;Family contact Intentional Self Injurious Behavior:  Cutting Comment - Self Injurious Behavior: not currently cutting Family Suicide History: No Recent stressful life event(s): Other (Comment) (auditory hallucinations) Persecutory voices/beliefs?: No Depression: No Substance abuse history and/or treatment for substance abuse?: No Suicide prevention information given to non-admitted patients: Yes  Risk to Others Homicidal Ideation: No Thoughts of Harm to Others: No Current Homicidal Intent: No Current Homicidal Plan: No Access to Homicidal Means: No History of harm to others?: No Assessment of Violence: None Noted Does patient  have access to weapons?: No Criminal Charges Pending?: No Does patient have a court date: No  Psychosis Hallucinations: Auditory (people laughing and talking, cannot make out words) Delusions: None noted  Mental Status Report Appear/Hygiene: Disheveled Eye Contact: Fair Motor Activity: Freedom of movement Speech: Soft Level of Consciousness: Quiet/awake Mood: Anxious Affect: Appropriate to circumstance Anxiety Level: Panic Attacks Panic attack frequency: 2-3 daily Most recent panic attack: today Thought Processes: Coherent;Relevant Judgement: Unimpaired Orientation: Person;Place;Time;Situation Obsessive Compulsive Thoughts/Behaviors: None  Cognitive Functioning Concentration: Decreased Memory: Recent Intact;Remote Intact IQ: Average Insight: Fair Impulse Control: Fair Appetite: Fair Sleep: Decreased Total Hours of Sleep: 3  Vegetative Symptoms: None  ADLScreening [facility]) Patient's cognitive ability adequate to safely complete daily activities?: Yes Patient able to express need for assistance with ADLs?: Yes Independently performs ADLs?: Yes (appropriate for developmental age)  Abuse/Neglect [facility]) Physical Abuse: Denies Verbal Abuse: Denies Sexual Abuse: Denies  Prior Inpatient Therapy Prior Inpatient Therapy: Yes Prior Therapy Dates: 2012 Prior Therapy Facilty/Provider(s): [facility] Reason for Treatment: AVH, SI  Prior Outpatient Therapy Prior Outpatient Therapy: Yes Prior Therapy Facilty/Provider(s): [facility] Reason for Treatment: AVH, Depression  ADL Screening (condition at time of admission) Patient's cognitive ability adequate to safely complete daily activities?: Yes Patient able to express need for assistance with ADLs?: Yes Independently performs ADLs?: Yes (appropriate for developmental age) Weakness of Legs: None Weakness of Arms/Hands: None       Abuse/Neglect Assessment (Assessment to be complete while patient is alone) Physical  Abuse: Denies Verbal Abuse: Denies Sexual Abuse: Denies Exploitation of patient/patient's resources: Denies Self-Neglect: Denies Values / Beliefs Cultural Requests During Hospitalization: None Spiritual Requests During Hospitalization: None   Advance Directives (For Healthcare) Advance Directive: Patient does not have advance directive;Patient would not like information Pre-existing out of facility DNR order (yellow form or pink MOST form): No Nutrition Screen- [facility] Patient's home diet: Regular Have you recently lost weight without trying?: No Have you been eating poorly because of a decreased appetite?: No Malnutrition Screening Tool Score: 0   Additional Information 1:1 In Past 12 Months?: No CIRT Risk: No Elopement Risk: No Does patient have medical clearance?: Yes     Disposition:  Disposition Disposition of Patient: Other dispositions  On Site Evaluation by:  Ilan.Keens Reviewed with Physician:  Caryl Asp Marlana Latus 12/23/2011 5:34 AM

## 2011-12-23 NOTE — ED Provider Notes (Signed)
ACT has assessed pt and feels that as she is having no SI/HI , depression of concerning symptoms that she can be discharged. She says that patient mainly wants to have her shot of Haldol and can follow-up with [facility] and she is established with them. Pt will be given shot of Haldol as per ACT request. I see in chart that patient has had Haldol shot in ED and been discharged in the past.  Pt has been advised of the symptoms that warrant their return to the ED. Patient has voiced understanding and has agreed to follow-up with the PCP or specialist.   Dorthula Matas, PA 12/23/11 [phone]

## 2011-12-23 NOTE — ED Provider Notes (Signed)
Patient discharged in stable condition. Prescriptions were not previously provided, aside from Percocet that was electronically sent, and the patient requested refills.  This was accommodated.  She will f/u at [facility] for long term medication reconciliation / titration.  Gerhard Munch, MD 12/23/11 [phone]

## 2011-12-23 NOTE — ED Provider Notes (Signed)
History     CSN: [id number]  Arrival date & time 12/23/11  0223   First MD Initiated Contact with Patient 12/23/11 0315      Chief Complaint  Patient presents with  . Medical Clearance  . Hallucinations    (Consider location/radiation/quality/duration/timing/severity/associated sxs/prior treatment) HPI  PT to the ER because she has been off of her "mental medications and shots". She says that she stopped taking them because they were causing her to bruise. Now she is hearing voices again. She says that she does not have any medications or refills and needs to get them in her system. The voices are not telling her to do bad things. No SI or HI. Marland Kitchen She says that she was diagnosed with kidney stone recently and sometimes has some side pains that she wants Ibuprofen for. I have reviewed note and patient does have small stones in kidneys and ureters in September of this year. nad/vss  Past Medical History  Diagnosis Date  . 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 Brain tumor   . Schizoaffective disorder   . Anxiety   . Depression   . Cancer 1991    ovarian  . Hypertension   . Kidney stones   . Kidney stone   . Seizures     Past Surgical History  Procedure Date  . Abdominal hysterectomy     partial  . Kidney stone removal   . Appendectomy     Family History  Problem Relation Age of Onset  . Diabetes type II Other     History  Substance Use Topics  . Smoking status: Current Every Day Smoker -- 1.0 packs/day  . Smokeless tobacco: Not on file  . Alcohol Use: No    OB History    Grav Para Term Preterm Abortions TAB SAB Ect Mult Living                  Review of Systems   Review of Systems  Gen: no weight loss, fevers, chills, night sweats  Eyes: no discharge or drainage, no occular pain or visual changes  Nose: no epistaxis or rhinorrhea  Mouth: no dental pain, no sore throat  Neck: no neck pain  Lungs:No wheezing, coughing or hemoptysis CV: no  chest pain, palpitations, dependent edema or orthopnea  Abd: no abdominal pain, nausea, vomiting  GU: no dysuria or gross hematuria, +kidney stones MSK:  No abnormalities  Neuro: no headache, no focal neurologic deficits  Skin: no abnormalities Psyche: hearing voices.    Allergies  Ketorolac tromethamine; Lortab; and Tramadol  Home Medications   Current Outpatient Rx  Name Route Sig Dispense Refill  . CLONAZEPAM 0.5 MG PO TABS Oral Take 0.5 mg by mouth 3 (three) times daily as needed. Anxiety    . NITROFURANTOIN MONOHYD MACRO 100 MG PO CAPS Oral Take 1 capsule (100 mg total) by mouth 2 (two) times daily. 10 capsule 0  . OXYCODONE-ACETAMINOPHEN 5-325 MG PO TABS Oral Take 1 tablet by mouth every 6 (six) hours as needed for pain. 8 tablet 0  . TRAZODONE HCL 100 MG PO TABS Oral Take 100 mg by mouth at bedtime.      BP 155/103  Pulse 79  Temp 98.8 F (37.1 C) (Oral)  Resp 16  SpO2 98%  Physical Exam  Nursing note and vitals reviewed. Constitutional: She appears well-developed and well-nourished. No distress.  HENT:  Head: Normocephalic and atraumatic.  Eyes: Pupils are equal, round, and reactive  to light.  Neck: Normal range of motion. Neck supple.  Cardiovascular: Normal rate and regular rhythm.   Pulmonary/Chest: Effort normal.  Abdominal: Soft.  Neurological: She is alert.  Skin: Skin is warm and dry.  Psychiatric: Her mood appears anxious. Her affect is not angry. She is actively hallucinating. She does not exhibit a depressed mood. She expresses no homicidal and no suicidal ideation. She expresses no suicidal plans and no homicidal plans.    ED Course  Procedures (including critical care time)  Labs Reviewed  URINE RAPID DRUG SCREEN (HOSP PERFORMED) - Abnormal; Notable for the following:    Cocaine POSITIVE (*)     Tetrahydrocannabinol POSITIVE (*)     All other components within normal limits  PREGNANCY, URINE  CBC  COMPREHENSIVE METABOLIC PANEL  ETHANOL    ACETAMINOPHEN LEVEL  SALICYLATE LEVEL   No results found.   1. Schizophrenia   2. Anxiety   3. Kidney stone       MDM  Holding orders placed and ACT consulted.         Dorthula Matas, PA 12/23/11 [phone]

## 2011-12-23 NOTE — ED Provider Notes (Signed)
Medical screening examination/treatment/procedure(s) were performed by non-physician practitioner and as supervising physician I was immediately available for consultation/collaboration.  Jacquie Lukes, MD 12/23/11 0451 

## 2011-12-23 NOTE — ED Notes (Signed)
Written instructions/saftey contract/rx's reviewed w/ pt. Pt verbalized understanding.  Pt encouraged to follow up at [facility] this week and follow up w/ her urologist as scheduled.  Pt verbalized understanding.  Belongings return after dc, pt ambulatory w/o difficulty.

## 2011-12-23 NOTE — ED Notes (Signed)
Awake, reports she is ready to go, smiling, pleasant, up to the bathroom ambulatory w/o difficulty.

## 2011-12-23 NOTE — ED Notes (Signed)
Pt states she has been hearing voices x 2 weeks and has been out of, or not taking, her medications as she is supposed to. Pt states she has not taken her "shots" x 4 months.

## 2011-12-24 NOTE — ED Provider Notes (Signed)
Medical screening examination/treatment/procedure(s) were performed by non-physician practitioner and as supervising physician I was immediately available for consultation/collaboration.  Kristan Votta, MD 12/24/11 0137 

## 2012-01-08 ENCOUNTER — Encounter (HOSPITAL_COMMUNITY): Payer: Self-pay | Admitting: Emergency Medicine

## 2012-01-08 ENCOUNTER — Emergency Department (HOSPITAL_COMMUNITY): Payer: Medicaid Other

## 2012-01-08 ENCOUNTER — Emergency Department (HOSPITAL_COMMUNITY)
Admission: EM | Admit: 2012-01-08 | Discharge: 2012-01-08 | Disposition: A | Discharge status: Home / Self Care | Payer: Medicaid Other | Attending: Emergency Medicine | Admitting: Emergency Medicine

## 2012-01-08 DIAGNOSIS — W108XXA Fall (on) (from) other stairs and steps, initial encounter: Secondary | ICD-10-CM | POA: Insufficient documentation

## 2012-01-08 DIAGNOSIS — S0993XA Unspecified injury of face, initial encounter: Secondary | ICD-10-CM | POA: Insufficient documentation

## 2012-01-08 DIAGNOSIS — I10 Essential (primary) hypertension: Secondary | ICD-10-CM | POA: Insufficient documentation

## 2012-01-08 DIAGNOSIS — D496 Neoplasm of unspecified behavior of brain: Secondary | ICD-10-CM | POA: Insufficient documentation

## 2012-01-08 DIAGNOSIS — F259 Schizoaffective disorder, unspecified: Secondary | ICD-10-CM | POA: Insufficient documentation

## 2012-01-08 DIAGNOSIS — Y939 Activity, unspecified: Secondary | ICD-10-CM | POA: Insufficient documentation

## 2012-01-08 DIAGNOSIS — Z87442 Personal history of urinary calculi: Secondary | ICD-10-CM | POA: Insufficient documentation

## 2012-01-08 DIAGNOSIS — IMO0002 Reserved for concepts with insufficient information to code with codable children: Secondary | ICD-10-CM | POA: Insufficient documentation

## 2012-01-08 DIAGNOSIS — F411 Generalized anxiety disorder: Secondary | ICD-10-CM | POA: Insufficient documentation

## 2012-01-08 DIAGNOSIS — Y929 Unspecified place or not applicable: Secondary | ICD-10-CM | POA: Insufficient documentation

## 2012-01-08 DIAGNOSIS — F3289 Other specified depressive episodes: Secondary | ICD-10-CM | POA: Insufficient documentation

## 2012-01-08 DIAGNOSIS — F329 Major depressive disorder, single episode, unspecified: Secondary | ICD-10-CM | POA: Insufficient documentation

## 2012-01-08 DIAGNOSIS — W19XXXA Unspecified fall, initial encounter: Secondary | ICD-10-CM

## 2012-01-08 DIAGNOSIS — Z79899 Other long term (current) drug therapy: Secondary | ICD-10-CM | POA: Insufficient documentation

## 2012-01-08 DIAGNOSIS — F17200 Nicotine dependence, unspecified, uncomplicated: Secondary | ICD-10-CM | POA: Insufficient documentation

## 2012-01-08 DIAGNOSIS — C569 Malignant neoplasm of unspecified ovary: Secondary | ICD-10-CM | POA: Insufficient documentation

## 2012-01-08 MED ORDER — OXYCODONE-ACETAMINOPHEN 5-325 MG PO TABS
2.0000 | ORAL_TABLET | Freq: Once | ORAL | Status: AC
  Administered 2012-01-08: 2 via ORAL
  Filled 2012-01-08: qty 2

## 2012-01-08 MED ORDER — OXYCODONE-ACETAMINOPHEN 5-325 MG PO TABS: 2.0000 | ORAL_TABLET | Freq: Four times a day (QID) | ORAL | Status: DC | PRN

## 2012-01-08 NOTE — ED Notes (Signed)
Bed:WHALA<BR> Expected date:<BR> Expected time:<BR> Means of arrival:<BR> Comments:<BR> LSB-Fall

## 2012-01-08 NOTE — ED Notes (Signed)
Patient states she ran out of all meds a week ago-pain meds 4 weeks ago

## 2012-01-08 NOTE — ED Notes (Signed)
Per EMS, fell down 3 stairs-no LOC, did not hit head-c/o neck/back pain

## 2012-01-08 NOTE — ED Notes (Signed)
Per MD, ok to get patient in wheelchair and assist to bathroom

## 2012-01-08 NOTE — ED Provider Notes (Signed)
History     CSN: [id number]  Arrival date & time 01/08/12  1254   First MD Initiated Contact with Patient 01/08/12 1357      Chief Complaint  Patient presents with  . Fall  . Back Pain    (Consider location/radiation/quality/duration/timing/severity/associated sxs/prior treatment) HPI His or her porch she fell down 12 stairs immediately prior to coming here when "my knee gave way" she denies loss of consciousness was feeling well prior to the event she complains of nonradiating diffuse back pain and nonradiating neck pain since the event denies abdominal pain denies focal numbness or weakness she was ambulatory after the event treated with long board hard collar and CID pain is moderate worse with movement improved with remaining still Past Medical History  Diagnosis Date  . 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 Brain tumor   . Schizoaffective disorder   . Anxiety   . Depression   . Cancer 1991    ovarian  . Hypertension   . Kidney stones   . Kidney stone   . Seizures     Past Surgical History  Procedure Date  . Abdominal hysterectomy     partial  . Kidney stone removal   . Appendectomy     Family History  Problem Relation Age of Onset  . Diabetes type II Other     History  Substance Use Topics  . Smoking status: Current Every Day Smoker -- 1.0 packs/day  . Smokeless tobacco: Not on file  . Alcohol Use: No   No illicit drugs OB History    Grav Para Term Preterm Abortions TAB SAB Ect Mult Living                  Review of Systems  Constitutional: Negative.   HENT: Positive for neck pain.   Respiratory: Negative.   Cardiovascular: Negative.   Gastrointestinal: Negative.   Musculoskeletal: Positive for back pain.  Skin: Negative.   Neurological: Negative.   Hematological: Negative.   Psychiatric/Behavioral: Negative.   All other systems reviewed and are negative.    Allergies  Ketorolac tromethamine; Lortab; and Tramadol  Home  Medications   Current Outpatient Rx  Name  Route  Sig  Dispense  Refill  . ALPRAZOLAM 1 MG PO TABS   Oral   Take 1 mg by mouth 3 (three) times daily as needed. For anxiety         . HALOPERIDOL LACTATE 2 MG/ML PO CONC   Oral   Take 1.5 mg by mouth every 30 (thirty) days.         Marland Kitchen QUETIAPINE FUMARATE 25 MG PO TABS   Oral   Take 1 tablet (25 mg total) by mouth 2 (two) times daily.   30 tablet   0   . TRAZODONE HCL 100 MG PO TABS   Oral   Take 1 tablet (100 mg total) by mouth at bedtime.   15 tablet   0     BP 156/101  Pulse 92  Temp 97.5 F (36.4 C) (Oral)  Resp 18  SpO2 98%  Physical Exam  Nursing note and vitals reviewed. Constitutional: She appears well-developed and well-nourished. No distress.  HENT:  Head: Normocephalic and atraumatic.  Eyes: Conjunctivae normal are normal. Pupils are equal, round, and reactive to light.  Neck: Neck supple. No tracheal deviation present. No thyromegaly present.       Cervical spine tender diffusely  Cardiovascular: Normal rate and regular rhythm.  No murmur heard. Pulmonary/Chest: Effort normal and breath sounds normal. She exhibits tenderness.       Chest minimally tender at left lateral ribs, no ecchymosis, crepitance or flail  Abdominal: Soft. Bowel sounds are normal. She exhibits no distension. There is no tenderness.       Morbidly obese  Musculoskeletal: Normal range of motion. She exhibits no edema and no tenderness.       Thoracic and lumbar spine tender diffusely. Pelvis stable nontender. All 4 extremities without deformity swelling or tenderness, neurovascularly intact  Neurological: She is alert. No cranial nerve deficit. Coordination normal.  Skin: Skin is warm and dry. No rash noted.  Psychiatric: She has a normal mood and affect.    ED Course  Procedures (including critical care time)  Labs Reviewed - No data to display No results found.   No diagnosis found.  4:10 PM feels improved after  treatment with Percocet. Patient alert ambulatory without difficulty Glasgow Coma Score 15 MDM  Plan prescription Percocet, blood pressure recheck within the next 3 weeks Referral resource guide Diagnosis #1 fall #2 neck pain #3 back pain #4 elevated blood pressure        Doug Sou, MD 01/08/12 1627

## 2012-01-12 ENCOUNTER — Encounter (HOSPITAL_COMMUNITY): Payer: Self-pay | Admitting: Emergency Medicine

## 2012-01-12 ENCOUNTER — Emergency Department (HOSPITAL_COMMUNITY)
Admission: EM | Admit: 2012-01-12 | Discharge: 2012-01-12 | Disposition: A | Discharge status: Home / Self Care | Payer: Medicaid Other | Attending: Emergency Medicine | Admitting: Emergency Medicine

## 2012-01-12 DIAGNOSIS — F329 Major depressive disorder, single episode, unspecified: Secondary | ICD-10-CM | POA: Insufficient documentation

## 2012-01-12 DIAGNOSIS — Z87442 Personal history of urinary calculi: Secondary | ICD-10-CM | POA: Insufficient documentation

## 2012-01-12 DIAGNOSIS — R443 Hallucinations, unspecified: Secondary | ICD-10-CM | POA: Insufficient documentation

## 2012-01-12 DIAGNOSIS — F411 Generalized anxiety disorder: Secondary | ICD-10-CM | POA: Insufficient documentation

## 2012-01-12 DIAGNOSIS — Z79899 Other long term (current) drug therapy: Secondary | ICD-10-CM | POA: Insufficient documentation

## 2012-01-12 DIAGNOSIS — D496 Neoplasm of unspecified behavior of brain: Secondary | ICD-10-CM | POA: Insufficient documentation

## 2012-01-12 DIAGNOSIS — F209 Schizophrenia, unspecified: Secondary | ICD-10-CM | POA: Insufficient documentation

## 2012-01-12 DIAGNOSIS — F17200 Nicotine dependence, unspecified, uncomplicated: Secondary | ICD-10-CM | POA: Insufficient documentation

## 2012-01-12 DIAGNOSIS — Z87898 Personal history of other specified conditions: Secondary | ICD-10-CM | POA: Insufficient documentation

## 2012-01-12 DIAGNOSIS — C569 Malignant neoplasm of unspecified ovary: Secondary | ICD-10-CM | POA: Insufficient documentation

## 2012-01-12 DIAGNOSIS — I10 Essential (primary) hypertension: Secondary | ICD-10-CM | POA: Insufficient documentation

## 2012-01-12 DIAGNOSIS — F3289 Other specified depressive episodes: Secondary | ICD-10-CM | POA: Insufficient documentation

## 2012-01-12 MED ORDER — ALPRAZOLAM 1 MG PO TABS: 1.0000 mg | ORAL_TABLET | Freq: Three times a day (TID) | ORAL | Status: DC | PRN

## 2012-01-12 MED ORDER — TRAZODONE HCL 100 MG PO TABS: 100.0000 mg | ORAL_TABLET | Freq: Every day | ORAL | Status: DC

## 2012-01-12 MED ORDER — HALOPERIDOL LACTATE 5 MG/ML IJ SOLN
5.0000 mg | Freq: Once | INTRAMUSCULAR | Status: AC
  Administered 2012-01-12: 5 mg via INTRAMUSCULAR
  Filled 2012-01-12: qty 1

## 2012-01-12 MED ORDER — QUETIAPINE FUMARATE 25 MG PO TABS: 25.0000 mg | ORAL_TABLET | Freq: Two times a day (BID) | ORAL | Status: DC

## 2012-01-12 NOTE — ED Notes (Signed)
Pt states she has been out of her meds, pt states she has been receiving calls from her mother (mother is deceased) pt has been hitting head on the walls of her home. Pt states she needs her haldol to sleep.

## 2012-01-12 NOTE — ED Notes (Signed)
Per EMS, patient here for evaluation of hallucinations and psychosis. Patient extremely and anxious and has been banging her head against the wall.

## 2012-01-12 NOTE — ED Notes (Signed)
Pt transported from home via EMS for c/o hallucinations and erratic behavior reported by cousin. Pt given haldol 5mg  IM PTA. Pt visibly upset on arrival.

## 2012-01-12 NOTE — ED Notes (Signed)
Pt states she needs to ride bus home. Pt states she will need bus pass

## 2012-01-12 NOTE — ED Provider Notes (Signed)
Medical screening examination/treatment/procedure(s) were conducted as a shared visit with non-physician practitioner(s) and myself.  I personally evaluated the patient during the encounter  Pt given im haldol and had her meds re-filled  Toy Baker, MD 01/12/12 [phone]

## 2012-01-12 NOTE — ED Provider Notes (Signed)
History     CSN: [id number]  Arrival date & time 01/12/12  0453   First MD Initiated Contact with Patient 01/12/12 0502      Chief Complaint  Patient presents with  . Hallucinations    (Consider location/radiation/quality/duration/timing/severity/associated sxs/prior treatment) Patient is a 26 y.o. female presenting with mental health disorder. The history is provided by the patient. No language interpreter was used.  Mental Health Problem The primary symptoms include hallucinations. The current episode started today. This is a new problem.  The onset of the illness is precipitated by emotional stress. The degree of incapacity that she is experiencing as a consequence of her illness is moderate. She does not admit to suicidal ideas. She does not have a plan to commit suicide. She does not contemplate harming herself. She has not already injured self. She does not contemplate injuring another person. Risk factors that are present for mental illness include a history of mental illness.    Past Medical History  Diagnosis Date  . 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 Brain tumor   . Schizoaffective disorder   . Anxiety   . Depression   . Cancer 1991    ovarian  . Hypertension   . Kidney stones   . Kidney stone   . Seizures     Past Surgical History  Procedure Date  . Abdominal hysterectomy     partial  . Kidney stone removal   . Appendectomy     Family History  Problem Relation Age of Onset  . Diabetes type II Other     History  Substance Use Topics  . Smoking status: Current Every Day Smoker -- 1.0 packs/day  . Smokeless tobacco: Not on file  . Alcohol Use: No    OB History    Grav Para Term Preterm Abortions TAB SAB Ect Mult Living                  Review of Systems  Psychiatric/Behavioral: Positive for hallucinations.  All other systems reviewed and are negative.    Allergies  Ketorolac tromethamine; Lortab; and Tramadol  Home  Medications   Current Outpatient Rx  Name  Route  Sig  Dispense  Refill  . ALPRAZOLAM 1 MG PO TABS   Oral   Take 1 mg by mouth 3 (three) times daily as needed. For anxiety         . HALOPERIDOL LACTATE 2 MG/ML PO CONC   Oral   Take 1.5 mg by mouth every 30 (thirty) days.         . OXYCODONE-ACETAMINOPHEN 5-325 MG PO TABS   Oral   Take 2 tablets by mouth every 6 (six) hours as needed for pain.   8 tablet   0   . QUETIAPINE FUMARATE 25 MG PO TABS   Oral   Take 1 tablet (25 mg total) by mouth 2 (two) times daily.   30 tablet   0   . TRAZODONE HCL 100 MG PO TABS   Oral   Take 1 tablet (100 mg total) by mouth at bedtime.   15 tablet   0     BP 174/77  Pulse 85  Temp 98.2 F (36.8 C) (Oral)  Resp 20  SpO2 97%  Physical Exam  Nursing note and vitals reviewed. Constitutional: She is oriented to person, place, and time. She appears well-developed and well-nourished.  HENT:  Head: Normocephalic and atraumatic.  Right Ear: External ear normal.  Left Ear: External ear normal.  Nose: Nose normal.  Mouth/Throat: Oropharynx is clear and moist.  Eyes: Conjunctivae normal and EOM are normal. Pupils are equal, round, and reactive to light.  Neck: Normal range of motion. Neck supple.  Cardiovascular: Normal rate and normal heart sounds.   Pulmonary/Chest: Effort normal and breath sounds normal.  Abdominal: Soft.  Musculoskeletal: Normal range of motion.  Neurological: She is alert and oriented to person, place, and time.  Skin: Skin is warm.  Psychiatric: She has a normal mood and affect.    ED Course  Procedures (including critical care time)  Labs Reviewed - No data to display No results found.   1. Hallucinations       MDM  Pt given haldol injection here.  Pt seen by Dr. Freida Busman.   Pt given rx for her medications.           Lonia Skinner [place], [place] 01/12/12 0517  Elson Areas, [place] 01/12/12 [phone]

## 2012-01-12 NOTE — ED Notes (Signed)
Bed:WA16<BR> Expected date:<BR> Expected time:<BR> Means of arrival:<BR> Comments:<BR> EMS

## 2012-01-22 ENCOUNTER — Emergency Department (HOSPITAL_COMMUNITY)
Admission: EM | Admit: 2012-01-22 | Discharge: 2012-01-22 | Disposition: A | Discharge status: Home / Self Care | Payer: Medicaid Other | Attending: Emergency Medicine | Admitting: Emergency Medicine

## 2012-01-22 ENCOUNTER — Emergency Department (HOSPITAL_COMMUNITY): Payer: Medicaid Other

## 2012-01-22 ENCOUNTER — Encounter (HOSPITAL_COMMUNITY): Payer: Self-pay | Admitting: *Deleted

## 2012-01-22 DIAGNOSIS — F329 Major depressive disorder, single episode, unspecified: Secondary | ICD-10-CM | POA: Insufficient documentation

## 2012-01-22 DIAGNOSIS — F17200 Nicotine dependence, unspecified, uncomplicated: Secondary | ICD-10-CM | POA: Insufficient documentation

## 2012-01-22 DIAGNOSIS — F411 Generalized anxiety disorder: Secondary | ICD-10-CM | POA: Insufficient documentation

## 2012-01-22 DIAGNOSIS — Z90710 Acquired absence of both cervix and uterus: Secondary | ICD-10-CM | POA: Insufficient documentation

## 2012-01-22 DIAGNOSIS — R109 Unspecified abdominal pain: Secondary | ICD-10-CM | POA: Insufficient documentation

## 2012-01-22 DIAGNOSIS — Z9089 Acquired absence of other organs: Secondary | ICD-10-CM | POA: Insufficient documentation

## 2012-01-22 DIAGNOSIS — Z86011 Personal history of benign neoplasm of the brain: Secondary | ICD-10-CM | POA: Insufficient documentation

## 2012-01-22 DIAGNOSIS — Z3202 Encounter for pregnancy test, result negative: Secondary | ICD-10-CM | POA: Insufficient documentation

## 2012-01-22 DIAGNOSIS — F3289 Other specified depressive episodes: Secondary | ICD-10-CM | POA: Insufficient documentation

## 2012-01-22 DIAGNOSIS — R509 Fever, unspecified: Secondary | ICD-10-CM | POA: Insufficient documentation

## 2012-01-22 DIAGNOSIS — F259 Schizoaffective disorder, unspecified: Secondary | ICD-10-CM | POA: Insufficient documentation

## 2012-01-22 DIAGNOSIS — I10 Essential (primary) hypertension: Secondary | ICD-10-CM | POA: Insufficient documentation

## 2012-01-22 DIAGNOSIS — G40309 Generalized idiopathic epilepsy and epileptic syndromes, not intractable, without status epilepticus: Secondary | ICD-10-CM | POA: Insufficient documentation

## 2012-01-22 DIAGNOSIS — Z87442 Personal history of urinary calculi: Secondary | ICD-10-CM | POA: Insufficient documentation

## 2012-01-22 DIAGNOSIS — N760 Acute vaginitis: Secondary | ICD-10-CM | POA: Insufficient documentation

## 2012-01-22 DIAGNOSIS — Z79899 Other long term (current) drug therapy: Secondary | ICD-10-CM | POA: Insufficient documentation

## 2012-01-22 LAB — URINALYSIS, ROUTINE W REFLEX MICROSCOPIC
Bilirubin Urine: NEGATIVE
Glucose, UA: NEGATIVE mg/dL
Hgb urine dipstick: NEGATIVE
Ketones, ur: NEGATIVE mg/dL
Nitrite: NEGATIVE
Protein, ur: 30 mg/dL — AB
Specific Gravity, Urine: 1.031 — ABNORMAL HIGH (ref 1.005–1.030)
Urobilinogen, UA: 1 mg/dL (ref 0.0–1.0)
pH: 6.5 (ref 5.0–8.0)

## 2012-01-22 LAB — BASIC METABOLIC PANEL
BUN: 15 mg/dL (ref 6–23)
CO2: 22 mEq/L (ref 19–32)
Calcium: 9.2 mg/dL (ref 8.4–10.5)
Chloride: 102 mEq/L (ref 96–112)
Creatinine, Ser: 0.84 mg/dL (ref 0.50–1.10)
GFR calc Af Amer: >90 mL/min (ref >90)
GFR calc non Af Amer: >90 mL/min (ref >90)
Glucose, Bld: 100 mg/dL — ABNORMAL HIGH (ref 70–99)
Potassium: 4.2 mEq/L (ref 3.5–5.1)
Sodium: 137 mEq/L (ref 135–145)

## 2012-01-22 LAB — WET PREP, GENITAL
Trich, Wet Prep: NONE SEEN
Yeast Wet Prep HPF POC: NONE SEEN

## 2012-01-22 LAB — CBC
HCT: 37.3 % (ref 36.0–46.0)
Hemoglobin: 12.2 g/dL (ref 12.0–15.0)
MCH: 30.5 pg (ref 26.0–34.0)
MCHC: 32.7 g/dL (ref 30.0–36.0)
MCV: 93.3 fL (ref 78.0–100.0)
Platelets: 345 10*3/uL (ref 150–400)
RBC: 4 MIL/uL (ref 3.87–5.11)
RDW: 13.1 % (ref 11.5–15.5)
WBC: 8.9 10*3/uL (ref 4.0–10.5)

## 2012-01-22 LAB — URINE MICROSCOPIC-ADD ON

## 2012-01-22 LAB — PREGNANCY, URINE: Preg Test, Ur: NEGATIVE

## 2012-01-22 MED ORDER — ONDANSETRON HCL 4 MG/2ML IJ SOLN
4.0000 mg | Freq: Once | INTRAMUSCULAR | Status: AC
  Administered 2012-01-22: 4 mg via INTRAVENOUS
  Filled 2012-01-22: qty 2

## 2012-01-22 MED ORDER — SODIUM CHLORIDE 0.9 % IV BOLUS (SEPSIS)
1000.0000 mL | Freq: Once | INTRAVENOUS | Status: AC
  Administered 2012-01-22: 1000 mL via INTRAVENOUS

## 2012-01-22 MED ORDER — METRONIDAZOLE 500 MG PO TABS: 500.0000 mg | ORAL_TABLET | Freq: Two times a day (BID) | ORAL | Status: DC

## 2012-01-22 MED ORDER — HYDROMORPHONE HCL PF 1 MG/ML IJ SOLN
1.0000 mg | Freq: Once | INTRAMUSCULAR | Status: AC
  Administered 2012-01-22: 1 mg via INTRAVENOUS
  Filled 2012-01-22: qty 1

## 2012-01-22 MED ORDER — IOHEXOL 300 MG/ML  SOLN
100.0000 mL | Freq: Once | INTRAMUSCULAR | Status: AC | PRN
  Administered 2012-01-22: 100 mL via INTRAVENOUS

## 2012-01-22 MED ORDER — ACETAMINOPHEN 325 MG PO TABS
650.0000 mg | ORAL_TABLET | Freq: Once | ORAL | Status: AC
  Administered 2012-01-22: 650 mg via ORAL
  Filled 2012-01-22: qty 2

## 2012-01-22 NOTE — ED Provider Notes (Signed)
History    26yF with fever and general malaise. Onset yesterday. Feels achy all over. Lower abdominal pain and vaginal odor. NO discharge. No urinary complaints. No diarrhea. Nausea and vomiting. No sick contacts. No cp or sob. No sore throat or ear pain.  CSN: [id number]  Arrival date & time 01/22/12  1757   First MD Initiated Contact with Patient 01/22/12 1806      Chief Complaint  Patient presents with  . Abdominal Pain  . Influenza    (Consider location/radiation/quality/duration/timing/severity/associated sxs/prior treatment) HPI  Past Medical History  Diagnosis Date  . 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 Brain tumor   . Schizoaffective disorder   . Anxiety   . Depression   . Cancer 1991    ovarian  . Hypertension   . Kidney stones   . Kidney stone   . Seizures     Past Surgical History  Procedure Date  . Abdominal hysterectomy     partial  . Kidney stone removal   . Appendectomy     Family History  Problem Relation Age of Onset  . Diabetes type II Other     History  Substance Use Topics  . Smoking status: Current Every Day Smoker -- 1.0 packs/day  . Smokeless tobacco: Not on file  . Alcohol Use: No    OB History    Grav Para Term Preterm Abortions TAB SAB Ect Mult Living                  Review of Systems   Review of symptoms negative unless otherwise noted in HPI.   Allergies  Ketorolac tromethamine; Lortab; and Tramadol  Home Medications   Current Outpatient Rx  Name  Route  Sig  Dispense  Refill  . ALPRAZOLAM 1 MG PO TABS   Oral   Take 1 mg by mouth 3 (three) times daily as needed. For anxiety         . HALOPERIDOL LACTATE 2 MG/ML PO CONC   Oral   Take 1.5 mg by mouth every 30 (thirty) days.         Marland Kitchen QUETIAPINE FUMARATE 25 MG PO TABS   Oral   Take 25 mg by mouth 2 (two) times daily.         . TRAZODONE HCL 100 MG PO TABS   Oral   Take 100 mg by mouth at bedtime.           BP 139/77  Pulse 114   Temp 100.2 F (37.9 C) (Oral)  Resp 16  SpO2 97%  Physical Exam  Nursing note and vitals reviewed. Constitutional: She appears well-developed and well-nourished. No distress.       Laying in bed. NAD. Morbidly obese.  HENT:  Head: Normocephalic and atraumatic.  Eyes: Conjunctivae normal are normal. Right eye exhibits no discharge. Left eye exhibits no discharge.  Neck: Neck supple.  Cardiovascular: Normal rate, regular rhythm and normal heart sounds.  Exam reveals no gallop and no friction rub.   No murmur heard. Pulmonary/Chest: Effort normal and breath sounds normal. No respiratory distress.  Abdominal: Soft. She exhibits no distension. There is tenderness.       Mild lower abdominal  tenderness w/o rebound or gaurding.  Genitourinary:       Normal external female genitalia. Strong vaginal odor. Whitish discharge. No CMT, adnexal mass or tenderness. No cva tenderness.  Musculoskeletal: She exhibits no edema and no tenderness.  Neurological: She is alert.  Skin: Skin is warm and dry.  Psychiatric: She has a normal mood and affect. Her behavior is normal. Thought content normal.    ED Course  Procedures (including critical care time)  Labs Reviewed  WET PREP, GENITAL - Abnormal; Notable for the following:    Clue Cells Wet Prep HPF POC FEW (*)     WBC, Wet Prep HPF POC FEW (*)     All other components within normal limits  URINALYSIS, ROUTINE W REFLEX MICROSCOPIC - Abnormal; Notable for the following:    APPearance CLOUDY (*)     Specific Gravity, Urine 1.031 (*)     Protein, ur 30 (*)     Leukocytes, UA SMALL (*)     All other components within normal limits  BASIC METABOLIC PANEL - Abnormal; Notable for the following:    Glucose, Bld 100 (*)     All other components within normal limits  URINE MICROSCOPIC-ADD ON - Abnormal; Notable for the following:    Squamous Epithelial / LPF MANY (*)     All other components within normal limits  PREGNANCY, URINE  CBC    GC/CHLAMYDIA PROBE AMP   Ct Abdomen Pelvis W Contrast  01/22/2012  *RADIOLOGY REPORT*  Clinical Data: Abdominal pain.  CT ABDOMEN AND PELVIS WITH CONTRAST  Technique:  Multidetector CT imaging of the abdomen and pelvis was performed following the standard protocol during bolus administration of intravenous contrast.  Contrast: OMNIPAQUE IOHEXOL 300 MG/ML  SOLN  Comparison: CT of the abdomen and pelvis 11/22/2011.  Findings:  Lung Bases: Unremarkable.  Abdomen/Pelvis:  The enhanced appearance of the liver, gallbladder, pancreas, spleen, bilateral adrenal glands and bilateral kidneys is unremarkable.  Multiple small splenules are incidentally noted.  No ascites or pneumoperitoneum and no pathologic distension of small bowel.  No definite pathologic lymphadenopathy identified within the abdomen or pelvis.  Status post appendectomy.  The urinary bladder is unremarkable in appearance.  Musculoskeletal: There are no aggressive appearing lytic or blastic lesions noted in the visualized portions of the skeleton.  IMPRESSION: 1.  No acute findings in the abdomen or pelvis to account the patient's symptoms. 2.  Status post appendectomy.   Original Report Authenticated By: Trudie Reed, M.D.      1. Fever   2. Abdominal pain   3. BV (bacterial vaginosis)       MDM  26yf with fever, abdominal pain and general malaise. Suspect viral illness. W/u fairly unremarkable aside from BV. Feel safe for DC. Flagyl for BV. Return precautions discussed. Outpt FU otherwise.        Raeford Razor, MD 01/24/12 [phone]

## 2012-01-22 NOTE — ED Notes (Signed)
[id number]<AV> Expected date:01/22/12<BR> Expected time: 5:56 PM<BR> Means of arrival:Ambulance<BR> Comments:<BR> 26ypf, vomiting/fever/weakness

## 2012-01-22 NOTE — ED Notes (Signed)
Per ems pt is from home. Alert and oriented x4. Ambulatory. Pt reports vomitting all day, weakness, chills. Abdominal pain, lower back pain. Active vomiting, ems administered 4 mg zofran. 22g L wrist. 100.8 tympanic temperature. 100mg  tylenol given.

## 2012-02-09 ENCOUNTER — Emergency Department (HOSPITAL_COMMUNITY): Payer: Medicaid Other

## 2012-02-09 ENCOUNTER — Emergency Department (HOSPITAL_COMMUNITY)
Admission: EM | Admit: 2012-02-09 | Discharge: 2012-02-09 | Disposition: A | Discharge status: Home / Self Care | Payer: Medicaid Other | Attending: Emergency Medicine | Admitting: Emergency Medicine

## 2012-02-09 ENCOUNTER — Encounter (HOSPITAL_COMMUNITY): Payer: Self-pay | Admitting: *Deleted

## 2012-02-09 DIAGNOSIS — F3289 Other specified depressive episodes: Secondary | ICD-10-CM | POA: Insufficient documentation

## 2012-02-09 DIAGNOSIS — J40 Bronchitis, not specified as acute or chronic: Secondary | ICD-10-CM | POA: Insufficient documentation

## 2012-02-09 DIAGNOSIS — R5383 Other fatigue: Secondary | ICD-10-CM | POA: Insufficient documentation

## 2012-02-09 DIAGNOSIS — R630 Anorexia: Secondary | ICD-10-CM | POA: Insufficient documentation

## 2012-02-09 DIAGNOSIS — F17200 Nicotine dependence, unspecified, uncomplicated: Secondary | ICD-10-CM | POA: Insufficient documentation

## 2012-02-09 DIAGNOSIS — I10 Essential (primary) hypertension: Secondary | ICD-10-CM | POA: Insufficient documentation

## 2012-02-09 DIAGNOSIS — R0602 Shortness of breath: Secondary | ICD-10-CM | POA: Insufficient documentation

## 2012-02-09 DIAGNOSIS — R079 Chest pain, unspecified: Secondary | ICD-10-CM | POA: Insufficient documentation

## 2012-02-09 DIAGNOSIS — R05 Cough: Secondary | ICD-10-CM | POA: Insufficient documentation

## 2012-02-09 DIAGNOSIS — R059 Cough, unspecified: Secondary | ICD-10-CM | POA: Insufficient documentation

## 2012-02-09 DIAGNOSIS — Z86011 Personal history of benign neoplasm of the brain: Secondary | ICD-10-CM | POA: Insufficient documentation

## 2012-02-09 DIAGNOSIS — R5381 Other malaise: Secondary | ICD-10-CM | POA: Insufficient documentation

## 2012-02-09 DIAGNOSIS — F411 Generalized anxiety disorder: Secondary | ICD-10-CM | POA: Insufficient documentation

## 2012-02-09 DIAGNOSIS — Z8669 Personal history of other diseases of the nervous system and sense organs: Secondary | ICD-10-CM | POA: Insufficient documentation

## 2012-02-09 DIAGNOSIS — Z8543 Personal history of malignant neoplasm of ovary: Secondary | ICD-10-CM | POA: Insufficient documentation

## 2012-02-09 DIAGNOSIS — Z79899 Other long term (current) drug therapy: Secondary | ICD-10-CM | POA: Insufficient documentation

## 2012-02-09 DIAGNOSIS — F329 Major depressive disorder, single episode, unspecified: Secondary | ICD-10-CM | POA: Insufficient documentation

## 2012-02-09 DIAGNOSIS — F259 Schizoaffective disorder, unspecified: Secondary | ICD-10-CM | POA: Insufficient documentation

## 2012-02-09 LAB — CBC WITH DIFFERENTIAL/PLATELET
Basophils Absolute: 0 10*3/uL (ref 0.0–0.1)
Basophils Relative: 0 % (ref 0–1)
Eosinophils Relative: 1 % (ref 0–5)
HCT: 37.4 % (ref 36.0–46.0)
MCH: 30.8 pg (ref 26.0–34.0)
MCHC: 33.2 g/dL (ref 30.0–36.0)
MCV: 93 fL (ref 78.0–100.0)
Monocytes Absolute: 0.5 10*3/uL (ref 0.1–1.0)
Monocytes Relative: 8 % (ref 3–12)
RDW: 12.9 % (ref 11.5–15.5)

## 2012-02-09 LAB — D-DIMER, QUANTITATIVE: D-Dimer, Quant: <0.27 ug/mL-FEU (ref 0.00–0.48)

## 2012-02-09 LAB — BASIC METABOLIC PANEL
BUN: 10 mg/dL (ref 6–23)
CO2: 24 mEq/L (ref 19–32)
Calcium: 9.4 mg/dL (ref 8.4–10.5)
Creatinine, Ser: 0.86 mg/dL (ref 0.50–1.10)
GFR calc Af Amer: >90 mL/min (ref >90)

## 2012-02-09 MED ORDER — ALBUTEROL SULFATE (5 MG/ML) 0.5% IN NEBU
2.5000 mg | INHALATION_SOLUTION | Freq: Once | RESPIRATORY_TRACT | Status: AC
  Administered 2012-02-09: 2.5 mg via RESPIRATORY_TRACT
  Filled 2012-02-09: qty 1

## 2012-02-09 MED ORDER — ALBUTEROL SULFATE HFA 108 (90 BASE) MCG/ACT IN AERS: 2.0000 | INHALATION_SPRAY | RESPIRATORY_TRACT | Status: DC | PRN

## 2012-02-09 MED ORDER — ALPRAZOLAM 1 MG PO TABS: 1.0000 mg | ORAL_TABLET | Freq: Three times a day (TID) | ORAL | Status: DC | PRN

## 2012-02-09 MED ORDER — IBUPROFEN 800 MG PO TABS
800.0000 mg | ORAL_TABLET | Freq: Once | ORAL | Status: AC
  Administered 2012-02-09: 800 mg via ORAL
  Filled 2012-02-09: qty 1

## 2012-02-09 MED ORDER — DOXYCYCLINE HYCLATE 100 MG PO CAPS: 100.0000 mg | ORAL_CAPSULE | Freq: Two times a day (BID) | ORAL | Status: DC

## 2012-02-09 NOTE — ED Notes (Signed)
Patient ambulated maintained o2 between 97-100%

## 2012-02-09 NOTE — ED Notes (Signed)
Pt reports developing SOB approximately 45 minutes prior to calling EMS. Reports taking breathing treatment with Albuterol inhaler without relief. PTAR reports bilateral lung sounds CTA.  Pt reports coughing up yellow sputum x 3 days. Pt denies fever.

## 2012-02-09 NOTE — ED Provider Notes (Signed)
History     CSN: [id number]  Arrival date & time 02/09/12  1149   First MD Initiated Contact with Patient 02/09/12 1152      Chief Complaint  Patient presents with  . Asthma    (Consider location/radiation/quality/duration/timing/severity/associated sxs/prior treatment) HPI Comments: Patient presents with cough and states that she thinks she has pneumonia or bronchitis. She reports 3 days of productive cough of yellow sputum. EMS was called she develop shortness of breath 45 minutes prior to their arrival. She's not been well without relief. She reports no recent hospitalizations for asthma. Denies any fever, abdominal pain nausea or vomiting. She has some chest pain with coughing and posttussive emesis. Denies any leg pain or swelling. Denies any sick contacts. Negative flu shot. History of ovarian cancer  The history is provided by the patient and the EMS personnel.    Past Medical History  Diagnosis Date  . 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 Brain tumor   . Schizoaffective disorder   . Anxiety   . Depression   . Cancer 1991    ovarian  . Hypertension   . Kidney stones   . Kidney stone   . Seizures     Past Surgical History  Procedure Date  . Abdominal hysterectomy     partial  . Kidney stone removal   . Appendectomy     Family History  Problem Relation Age of Onset  . Diabetes type II Other     History  Substance Use Topics  . Smoking status: Current Every Day Smoker -- 1.0 packs/day  . Smokeless tobacco: Not on file  . Alcohol Use: No    OB History    Grav Para Term Preterm Abortions TAB SAB Ect Mult Living                  Review of Systems  Constitutional: Positive for activity change, appetite change and fatigue. Negative for fever.  HENT: Negative for congestion and rhinorrhea.   Eyes: Negative for visual disturbance.  Respiratory: Positive for cough and shortness of breath. Negative for chest tightness.   Cardiovascular:  Positive for chest pain.  Gastrointestinal: Negative for nausea, vomiting and abdominal pain.  Genitourinary: Negative for dysuria, hematuria, vaginal bleeding and vaginal discharge.  Musculoskeletal: Negative for back pain.  Skin: Negative for rash.  Neurological: Positive for weakness. Negative for dizziness.    Allergies  Ketorolac tromethamine; Lortab; and Tramadol  Home Medications   Current Outpatient Rx  Name  Route  Sig  Dispense  Refill  . ALPRAZOLAM 1 MG PO TABS   Oral   Take 1 mg by mouth 3 (three) times daily as needed. For anxiety         . HALOPERIDOL LACTATE 2 MG/ML PO CONC   Oral   Take 1.5 mg by mouth every 30 (thirty) days.         . IBUPROFEN 200 MG PO TABS   Oral   Take 800 mg by mouth every 6 (six) hours as needed. For pain         . QUETIAPINE FUMARATE 25 MG PO TABS   Oral   Take 25 mg by mouth 2 (two) times daily.         . TRAZODONE HCL 100 MG PO TABS   Oral   Take 100 mg by mouth at bedtime.           BP 129/83  Pulse 90  Temp 97.9 F (  36.6 C) (Oral)  Resp 24  SpO2 98%  Physical Exam  Constitutional: She is oriented to person, place, and time. She appears well-developed and well-nourished. No distress.  HENT:  Head: Normocephalic and atraumatic.  Mouth/Throat: Oropharynx is clear and moist. No oropharyngeal exudate.  Eyes: Conjunctivae normal and EOM are normal. Pupils are equal, round, and reactive to light.  Neck: Normal range of motion. Neck supple.  Cardiovascular: Normal rate, regular rhythm and normal heart sounds.   No murmur heard.      Tachycardic  Pulmonary/Chest: Effort normal and breath sounds normal. No respiratory distress. She has no wheezes. She exhibits tenderness.  Abdominal: Soft. There is no tenderness. There is no rebound and no guarding.  Musculoskeletal: Normal range of motion. She exhibits no edema and no tenderness.  Neurological: She is alert and oriented to person, place, and time. No cranial nerve  deficit. She exhibits normal muscle tone. Coordination normal.  Skin: Skin is warm.    ED Course  Procedures (including critical care time)  Labs Reviewed  BASIC METABOLIC PANEL - Abnormal; Notable for the following:    Potassium 3.4 (*)     All other components within normal limits  CBC WITH DIFFERENTIAL  D-DIMER, QUANTITATIVE   Dg Chest 2 View  02/09/2012  *RADIOLOGY REPORT*  Clinical Data: Cough.  Short of breath.  Asthma.  CHEST - 2 VIEW  Comparison:  01/08/2012  Findings:  The heart size and mediastinal contours are within normal limits.  Both lungs are clear.  The visualized skeletal structures are unremarkable.  IMPRESSION: No active cardiopulmonary disease.   Original Report Authenticated By: Myles Rosenthal, M.D.      No diagnosis found.    MDM  Cough, shortness of breath, pleuritic chest pain.  Vitals stable, lungs clear, no distress.    chest x-ray negative. D-dimer negative. Lungs are clear to auscultation. Patient is a ambulatory in the ED without desaturation.  We'll treat for bronchitis with nebulizer, antibiotics, PCP followup.  Date: 02/09/2012  Rate: 100  Rhythm: normal sinus rhythm  QRS Axis: normal  Intervals: normal  ST/T Wave abnormalities: normal  Conduction Disutrbances:none  Narrative Interpretation:   Old EKG Reviewed: unchanged          Glynn Octave, MD 02/09/12 1531

## 2012-03-11 ENCOUNTER — Emergency Department (HOSPITAL_COMMUNITY): Payer: Medicaid Other

## 2012-03-11 ENCOUNTER — Emergency Department (HOSPITAL_COMMUNITY)
Admission: EM | Admit: 2012-03-11 | Discharge: 2012-03-11 | Disposition: A | Discharge status: Home / Self Care | Payer: Medicaid Other | Attending: Emergency Medicine | Admitting: Emergency Medicine

## 2012-03-11 ENCOUNTER — Encounter (HOSPITAL_COMMUNITY): Payer: Self-pay | Admitting: Physical Medicine and Rehabilitation

## 2012-03-11 DIAGNOSIS — R112 Nausea with vomiting, unspecified: Secondary | ICD-10-CM | POA: Insufficient documentation

## 2012-03-11 DIAGNOSIS — R1013 Epigastric pain: Secondary | ICD-10-CM | POA: Insufficient documentation

## 2012-03-11 DIAGNOSIS — F3289 Other specified depressive episodes: Secondary | ICD-10-CM | POA: Insufficient documentation

## 2012-03-11 DIAGNOSIS — Z87442 Personal history of urinary calculi: Secondary | ICD-10-CM | POA: Insufficient documentation

## 2012-03-11 DIAGNOSIS — D432 Neoplasm of uncertain behavior of brain, unspecified: Secondary | ICD-10-CM | POA: Insufficient documentation

## 2012-03-11 DIAGNOSIS — Z8543 Personal history of malignant neoplasm of ovary: Secondary | ICD-10-CM | POA: Insufficient documentation

## 2012-03-11 DIAGNOSIS — G40401 Other generalized epilepsy and epileptic syndromes, not intractable, with status epilepticus: Secondary | ICD-10-CM | POA: Insufficient documentation

## 2012-03-11 DIAGNOSIS — F329 Major depressive disorder, single episode, unspecified: Secondary | ICD-10-CM | POA: Insufficient documentation

## 2012-03-11 DIAGNOSIS — R197 Diarrhea, unspecified: Secondary | ICD-10-CM | POA: Insufficient documentation

## 2012-03-11 DIAGNOSIS — F411 Generalized anxiety disorder: Secondary | ICD-10-CM | POA: Insufficient documentation

## 2012-03-11 DIAGNOSIS — F209 Schizophrenia, unspecified: Secondary | ICD-10-CM | POA: Insufficient documentation

## 2012-03-11 DIAGNOSIS — R109 Unspecified abdominal pain: Secondary | ICD-10-CM

## 2012-03-11 DIAGNOSIS — Z79899 Other long term (current) drug therapy: Secondary | ICD-10-CM | POA: Insufficient documentation

## 2012-03-11 DIAGNOSIS — F17200 Nicotine dependence, unspecified, uncomplicated: Secondary | ICD-10-CM | POA: Insufficient documentation

## 2012-03-11 DIAGNOSIS — I10 Essential (primary) hypertension: Secondary | ICD-10-CM | POA: Insufficient documentation

## 2012-03-11 LAB — COMPREHENSIVE METABOLIC PANEL
AST: 13 U/L (ref 0–37)
Albumin: 3.9 g/dL (ref 3.5–5.2)
Alkaline Phosphatase: 90 U/L (ref 39–117)
Chloride: 104 mEq/L (ref 96–112)
Potassium: 4 mEq/L (ref 3.5–5.1)
Total Bilirubin: 0.2 mg/dL — ABNORMAL LOW (ref 0.3–1.2)

## 2012-03-11 LAB — CBC WITH DIFFERENTIAL/PLATELET
Basophils Absolute: 0 10*3/uL (ref 0.0–0.1)
Basophils Relative: 0 % (ref 0–1)
Hemoglobin: 12.3 g/dL (ref 12.0–15.0)
MCHC: 32.6 g/dL (ref 30.0–36.0)
Monocytes Relative: 5 % (ref 3–12)
Neutro Abs: 5 10*3/uL (ref 1.7–7.7)
Neutrophils Relative %: 66 % (ref 43–77)
RDW: 12.8 % (ref 11.5–15.5)

## 2012-03-11 LAB — URINALYSIS, ROUTINE W REFLEX MICROSCOPIC
Bilirubin Urine: NEGATIVE
Glucose, UA: NEGATIVE mg/dL
Ketones, ur: NEGATIVE mg/dL
Leukocytes, UA: NEGATIVE
pH: 6 (ref 5.0–8.0)

## 2012-03-11 MED ORDER — HYDROMORPHONE HCL PF 1 MG/ML IJ SOLN
1.0000 mg | Freq: Once | INTRAMUSCULAR | Status: AC
  Administered 2012-03-11: 1 mg via INTRAVENOUS
  Filled 2012-03-11: qty 1

## 2012-03-11 MED ORDER — ALPRAZOLAM 1 MG PO TABS: 1.0000 mg | ORAL_TABLET | Freq: Three times a day (TID) | ORAL | Status: DC | PRN

## 2012-03-11 MED ORDER — OXYCODONE-ACETAMINOPHEN 5-325 MG PO TABS: ORAL_TABLET | ORAL | Status: DC

## 2012-03-11 MED ORDER — MORPHINE SULFATE 4 MG/ML IJ SOLN
6.0000 mg | Freq: Once | INTRAMUSCULAR | Status: AC
  Administered 2012-03-11: 6 mg via INTRAVENOUS
  Filled 2012-03-11: qty 2

## 2012-03-11 MED ORDER — SODIUM CHLORIDE 0.9 % IV BOLUS (SEPSIS)
1000.0000 mL | Freq: Once | INTRAVENOUS | Status: AC
  Administered 2012-03-11: 1000 mL via INTRAVENOUS

## 2012-03-11 MED ORDER — ONDANSETRON HCL 4 MG PO TABS: 4.0000 mg | ORAL_TABLET | Freq: Three times a day (TID) | ORAL | Status: DC | PRN

## 2012-03-11 MED ORDER — ONDANSETRON HCL 4 MG/2ML IJ SOLN
4.0000 mg | Freq: Once | INTRAMUSCULAR | Status: AC
  Administered 2012-03-11: 4 mg via INTRAVENOUS
  Filled 2012-03-11: qty 2

## 2012-03-11 NOTE — ED Provider Notes (Signed)
Medical screening examination/treatment/procedure(s) were performed by non-physician practitioner and as supervising physician I was immediately available for consultation/collaboration.    Merri Dimaano R Kamyia Thomason, MD 03/11/12 1615 

## 2012-03-11 NOTE — ED Provider Notes (Signed)
Medical screening examination/treatment/procedure(s) were performed by non-physician practitioner and as supervising physician I was immediately available for consultation/collaboration.   Celene Kras, MD 03/11/12 [phone]

## 2012-03-11 NOTE — ED Provider Notes (Addendum)
History     CSN: [id number]  Arrival date & time 03/11/12  2130   First MD Initiated Contact with Patient 03/11/12 0830      Chief Complaint  Patient presents with  . Abdominal Pain    (Consider location/radiation/quality/duration/timing/severity/associated sxs/prior treatment) HPI Narda L Mcnee is a 27 y.o. female with past medical history significant for schizoaffective disorder and as per patient choleclithiasis. Patient is poor and contradictory historian. Complaining of 10 day history of middle and right epigastric pain developing into nausea, nonbloody, nonbilious, non-coffee-ground appearing vomiting (5 x day), diarrhea (5 x day). Has been unable to take POs during that time. Pain is rated as sharp and 10/10. No medicine taken to relieve symptoms. Denies chest pain, cough, SOB, fever, urinary s/s. Denies sick contacts. Past medical history significant for appendectomy, nephrolithiasis, and ovarian cancer.   Past Medical History  Diagnosis Date  . 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 Brain tumor   . Schizoaffective disorder   . Anxiety   . Depression   . Cancer 1991    ovarian  . Hypertension   . Kidney stones   . Kidney stone   . Seizures     Past Surgical History  Procedure Date  . Abdominal hysterectomy     partial  . Kidney stone removal   . Appendectomy     Family History  Problem Relation Age of Onset  . Diabetes type II Other     History  Substance Use Topics  . Smoking status: Current Every Day Smoker -- 1.0 packs/day  . Smokeless tobacco: Not on file  . Alcohol Use: No    OB History    Grav Para Term Preterm Abortions TAB SAB Ect Mult Living                  Review of Systems  Constitutional: Negative for fever.  Respiratory: Negative for shortness of breath.   Cardiovascular: Negative for chest pain.  Gastrointestinal: Positive for nausea, vomiting, abdominal pain and diarrhea.  All other systems reviewed and are  negative.    Allergies  Ketorolac tromethamine; Lortab; and Tramadol  Home Medications   Current Outpatient Rx  Name  Route  Sig  Dispense  Refill  . ALPRAZOLAM 1 MG PO TABS   Oral   Take 1 tablet (1 mg total) by mouth 3 (three) times daily as needed. For anxiety   21 tablet   0   . HALOPERIDOL LACTATE 2 MG/ML PO CONC   Oral   Take 1.5 mg by mouth every 21 ( twenty-one) days.          Marland Kitchen QUETIAPINE FUMARATE 25 MG PO TABS   Oral   Take 25 mg by mouth 2 (two) times daily.         . TRAZODONE HCL 100 MG PO TABS   Oral   Take 100 mg by mouth at bedtime.         . ALBUTEROL SULFATE HFA 108 (90 BASE) MCG/ACT IN AERS   Inhalation   Inhale 2 puffs into the lungs every 4 (four) hours as needed for wheezing.   1 Inhaler   0     BP 151/90  Pulse 103  Temp 99.2 F (37.3 C)  Resp 18  SpO2 97%  Physical Exam  Nursing note and vitals reviewed. Constitutional: She appears well-developed and well-nourished.       Obese   HENT:  Mouth/Throat: Oropharynx is clear and moist.  Eyes: Conjunctivae normal and EOM are normal.  Cardiovascular: Normal rate and regular rhythm.   Pulmonary/Chest: Effort normal and breath sounds normal. No stridor. No respiratory distress. She has no wheezes. She has no rales. She exhibits no tenderness.  Abdominal: Soft. Bowel sounds are normal. She exhibits no distension and no mass. There is tenderness. There is no rebound and no guarding.       +BS, +Murphy's sign    ED Course  Procedures (including critical care time)  Labs Reviewed  COMPREHENSIVE METABOLIC PANEL - Abnormal; Notable for the following:    Glucose, Bld 112 (*)     Total Bilirubin 0.2 (*)     All other components within normal limits  URINALYSIS, ROUTINE W REFLEX MICROSCOPIC - Abnormal; Notable for the following:    APPearance HAZY (*)     All other components within normal limits  CBC WITH DIFFERENTIAL  LIPASE, BLOOD  POCT PREGNANCY, URINE   US Abdomen  Complete  03/11/2012  *RADIOLOGY REPORT*  Clinical Data:  Epigastric pain.  COMPLETE ABDOMINAL ULTRASOUND  Comparison:  CT 01/22/2012  Findings:  Gallbladder:  No stones or wall thickening.  Negative sonographic Murphy's.  Common bile duct:   Within normal limits in caliber.  Liver:  No focal lesion identified.  Within normal limits in parenchymal echogenicity.  IVC:  Appears normal.  Pancreas:  No focal abnormality seen.  Spleen:  Within normal limits in size and echotexture.  Right Kidney:   Normal in size and parenchymal echogenicity.  No evidence of mass or hydronephrosis.  Left Kidney:  Normal in size and parenchymal echogenicity.  No evidence of mass or hydronephrosis.  Abdominal aorta:  No aneurysm identified.  IMPRESSION: Negative abdominal ultrasound.   Original Report Authenticated By: Charlett Nose, M.D.    Dg Abd Acute W/chest  03/11/2012  *RADIOLOGY REPORT*  Clinical Data: Abdominal pain, vomiting, diarrhea, symptoms for 10 days  ACUTE ABDOMEN SERIES (ABDOMEN 2 VIEW & CHEST 1 VIEW)  Comparison: Chest radiograph 02/09/2012, abdominal radiograph 09/27/2011  Findings: Upper-normal size of cardiac silhouette. Mediastinal contours and pulmonary vascularity normal. Lungs clear. No pleural effusion or pneumothorax. Normal bowel gas pattern. No bowel dilatation, bowel wall thickening, or free intraperitoneal air. Bones unremarkable. No urinary tract calcification.  IMPRESSION: No acute abnormalities.   Original Report Authenticated By: Ulyses Southward, M.D.      1. Abdominal pain   2. Nausea vomiting and diarrhea       MDM  Patient with epigastric/right upper quadrant pain nausea vomiting diarrhea over one week. Doubt any acute or emergent process is on length of the symptoms. Patient states she has history of gallstones. Blood work, UA, HCG and ultrasound ordered to evaluate for cholecystitis, all of which were normal. Patient has had 2 prior abdominal surgeries 1 hysterectomy 1 appendectomy. Acute  abdominal series ordered to rule out partial SBO.  Serial abdominal exams remain nonsurgical with good bowel sounds she is quite tender to the epigastrium and right upper quadrant rating her pain a 9/10 after 6 mg of morphine. I will change her medication to Dilaudid.   Acute abdominal series shows no abnormal gas pattern.  Patient has passed her by mouth challenge, abdominal exam remains nonsurgical. Vital signs stable and she is appropriate for discharge at this time.   Pt verbalized understanding and agrees with care plan. Outpatient follow-up and return precautions given.         Wynetta Emery, PA-C 03/11/12 1208  Patient is requesting anxiety medication states that  she cannot get into [facility] until the end of March. I explained to her that we cannot fill anxiety medications for extended period of time however I will give her 2 days as a courtesy I have advised her to try to expedite her appointment into [facility].  New Prescriptions   ALPRAZOLAM (XANAX) 1 MG TABLET    Take 1 tablet (1 mg total) by mouth 3 (three) times daily as needed for anxiety.   ONDANSETRON (ZOFRAN) 4 MG TABLET    Take 1 tablet (4 mg total) by mouth every 8 (eight) hours as needed for nausea.   OXYCODONE-ACETAMINOPHEN (PERCOCET/ROXICET) 5-325 MG PER TABLET    1 to 2 tabs PO q6hrs  PRN for pain    Wynetta Emery, PA-C 03/11/12 1309

## 2012-03-11 NOTE — ED Notes (Signed)
Pt presents to department for evaluation of epigastric pain and N/V/D. Ongoing x1 week. States pain became worse today. 10/10 at the time. Denies urinary symptoms. Pt is alert and oriented x4.

## 2012-03-30 ENCOUNTER — Emergency Department (HOSPITAL_COMMUNITY)
Admission: EM | Admit: 2012-03-30 | Discharge: 2012-03-30 | Disposition: A | Discharge status: Home / Self Care | Payer: Medicaid Other | Attending: Emergency Medicine | Admitting: Emergency Medicine

## 2012-03-30 ENCOUNTER — Encounter (HOSPITAL_COMMUNITY): Payer: Self-pay | Admitting: Emergency Medicine

## 2012-03-30 DIAGNOSIS — F17200 Nicotine dependence, unspecified, uncomplicated: Secondary | ICD-10-CM | POA: Insufficient documentation

## 2012-03-30 DIAGNOSIS — Z86011 Personal history of benign neoplasm of the brain: Secondary | ICD-10-CM | POA: Insufficient documentation

## 2012-03-30 DIAGNOSIS — Z8543 Personal history of malignant neoplasm of ovary: Secondary | ICD-10-CM | POA: Insufficient documentation

## 2012-03-30 DIAGNOSIS — Z87442 Personal history of urinary calculi: Secondary | ICD-10-CM | POA: Insufficient documentation

## 2012-03-30 DIAGNOSIS — Z79899 Other long term (current) drug therapy: Secondary | ICD-10-CM | POA: Insufficient documentation

## 2012-03-30 DIAGNOSIS — F329 Major depressive disorder, single episode, unspecified: Secondary | ICD-10-CM | POA: Insufficient documentation

## 2012-03-30 DIAGNOSIS — F411 Generalized anxiety disorder: Secondary | ICD-10-CM | POA: Insufficient documentation

## 2012-03-30 DIAGNOSIS — G40909 Epilepsy, unspecified, not intractable, without status epilepticus: Secondary | ICD-10-CM | POA: Insufficient documentation

## 2012-03-30 DIAGNOSIS — F259 Schizoaffective disorder, unspecified: Secondary | ICD-10-CM | POA: Insufficient documentation

## 2012-03-30 DIAGNOSIS — F3289 Other specified depressive episodes: Secondary | ICD-10-CM | POA: Insufficient documentation

## 2012-03-30 DIAGNOSIS — I10 Essential (primary) hypertension: Secondary | ICD-10-CM | POA: Insufficient documentation

## 2012-03-30 MED ORDER — ALPRAZOLAM 0.5 MG PO TABS: 0.5000 mg | ORAL_TABLET | Freq: Every evening | ORAL | Status: DC | PRN

## 2012-03-30 MED ORDER — QUETIAPINE FUMARATE 25 MG PO TABS: 25.0000 mg | ORAL_TABLET | Freq: Two times a day (BID) | ORAL | Status: DC

## 2012-03-30 NOTE — ED Provider Notes (Signed)
History     CSN: [id number]  Arrival date & time 03/30/12  1257   First MD Initiated Contact with Patient 03/30/12 1451      Chief Complaint  Patient presents with  . Medication Refill    pt having anxiety d/t she is out of her medication, denies SI or HI    (Consider location/radiation/quality/duration/timing/severity/associated sxs/prior treatment) HPI Comments: Pt is she is out of her medications and she needs her xanax and seroquel filled:pt denies si/hi or hallucinations:pt is having haldol at [facility] tomorrow  The history is provided by the patient. No language interpreter was used.    Past Medical History  Diagnosis Date  . 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 Brain tumor   . Schizoaffective disorder   . Anxiety   . Depression   . Hypertension   . Seizures   . Kidney stones   . Kidney stone   . Cancer 1991    ovarian    Past Surgical History  Procedure Date  . Abdominal hysterectomy     partial  . Kidney stone removal   . Appendectomy     Family History  Problem Relation Age of Onset  . Diabetes type II Other     History  Substance Use Topics  . Smoking status: Current Every Day Smoker -- 1.0 packs/day    Types: Cigarettes  . Smokeless tobacco: Never Used  . Alcohol Use: No    OB History    Grav Para Term Preterm Abortions TAB SAB Ect Mult Living                  Review of Systems  Constitutional: Negative.   Respiratory: Negative.   Cardiovascular: Negative.     Allergies  Ketorolac tromethamine; Lortab; and Tramadol  Home Medications   Current Outpatient Rx  Name  Route  Sig  Dispense  Refill  . ALBUTEROL SULFATE HFA 108 (90 BASE) MCG/ACT IN AERS   Inhalation   Inhale 2 puffs into the lungs every 4 (four) hours as needed. Asthma         . HALOPERIDOL LACTATE 2 MG/ML PO CONC   Oral   Take 1.5 mg by mouth every 21 ( twenty-one) days.          Marland Kitchen ONDANSETRON HCL 4 MG PO TABS   Oral   Take 4 mg by mouth every 8  (eight) hours as needed.         . TRAZODONE HCL 100 MG PO TABS   Oral   Take 100 mg by mouth at bedtime.         . ALPRAZOLAM 0.5 MG PO TABS   Oral   Take 1 tablet (0.5 mg total) by mouth at bedtime as needed for sleep.   6 tablet   0   . ALPRAZOLAM 1 MG PO TABS   Oral   Take 1 mg by mouth 3 (three) times daily as needed. Anxiety         . QUETIAPINE FUMARATE 25 MG PO TABS   Oral   Take 1 tablet (25 mg total) by mouth 2 (two) times daily.   20 tablet   0     BP 148/95  Pulse 96  Temp 98.4 F (36.9 C) (Oral)  Resp 16  SpO2 98%  Physical Exam  Nursing note and vitals reviewed. Constitutional: She is oriented to person, place, and time. She appears well-developed and well-nourished.  Cardiovascular: Normal rate and  regular rhythm.   Pulmonary/Chest: Effort normal and breath sounds normal.  Musculoskeletal: Normal range of motion.  Neurological: She is alert and oriented to person, place, and time. Coordination normal.  Skin: Skin is warm and dry.  Psychiatric: Her mood appears anxious.    ED Course  Procedures (including critical care time)  Labs Reviewed - No data to display No results found.   1. Schizoaffective disorder       MDM  Pt seroquel and xanax filled:pt is not having any hallucination si/hi:pt is to get haldol tomorrow at [facility]:pt states that her scripts aren't getting filled because her doctor is out of town        [place], [place] 03/30/12 1512

## 2012-03-30 NOTE — ED Notes (Signed)
Pt presents w/ being out of her medications since December and is anxious d/t lack of medication. Will go to [facility] tomorrow for monthly Haldol shot tomorrow.

## 2012-04-01 NOTE — ED Provider Notes (Signed)
Medical screening examination/treatment/procedure(s) were performed by non-physician practitioner and as supervising physician I was immediately available for consultation/collaboration.\   Suzi Roots, MD 04/01/12 [phone]

## 2012-04-07 ENCOUNTER — Encounter (HOSPITAL_COMMUNITY): Payer: Self-pay

## 2012-04-07 ENCOUNTER — Emergency Department (HOSPITAL_COMMUNITY)
Admission: EM | Admit: 2012-04-07 | Discharge: 2012-04-07 | Disposition: A | Discharge status: Home / Self Care | Payer: Medicaid Other | Attending: Emergency Medicine | Admitting: Emergency Medicine

## 2012-04-07 DIAGNOSIS — F329 Major depressive disorder, single episode, unspecified: Secondary | ICD-10-CM | POA: Insufficient documentation

## 2012-04-07 DIAGNOSIS — F259 Schizoaffective disorder, unspecified: Secondary | ICD-10-CM | POA: Insufficient documentation

## 2012-04-07 DIAGNOSIS — Z87442 Personal history of urinary calculi: Secondary | ICD-10-CM | POA: Insufficient documentation

## 2012-04-07 DIAGNOSIS — Z8543 Personal history of malignant neoplasm of ovary: Secondary | ICD-10-CM | POA: Insufficient documentation

## 2012-04-07 DIAGNOSIS — IMO0001 Reserved for inherently not codable concepts without codable children: Secondary | ICD-10-CM | POA: Insufficient documentation

## 2012-04-07 DIAGNOSIS — Z79899 Other long term (current) drug therapy: Secondary | ICD-10-CM | POA: Insufficient documentation

## 2012-04-07 DIAGNOSIS — F411 Generalized anxiety disorder: Secondary | ICD-10-CM | POA: Insufficient documentation

## 2012-04-07 DIAGNOSIS — Z8669 Personal history of other diseases of the nervous system and sense organs: Secondary | ICD-10-CM | POA: Insufficient documentation

## 2012-04-07 DIAGNOSIS — Z86011 Personal history of benign neoplasm of the brain: Secondary | ICD-10-CM | POA: Insufficient documentation

## 2012-04-07 DIAGNOSIS — F1210 Cannabis abuse, uncomplicated: Secondary | ICD-10-CM | POA: Insufficient documentation

## 2012-04-07 DIAGNOSIS — F3289 Other specified depressive episodes: Secondary | ICD-10-CM | POA: Insufficient documentation

## 2012-04-07 DIAGNOSIS — F66 Other sexual disorders: Secondary | ICD-10-CM | POA: Insufficient documentation

## 2012-04-07 DIAGNOSIS — Z91199 Patient's noncompliance with other medical treatment and regimen due to unspecified reason: Secondary | ICD-10-CM | POA: Insufficient documentation

## 2012-04-07 DIAGNOSIS — F17200 Nicotine dependence, unspecified, uncomplicated: Secondary | ICD-10-CM | POA: Insufficient documentation

## 2012-04-07 DIAGNOSIS — Z9119 Patient's noncompliance with other medical treatment and regimen: Secondary | ICD-10-CM | POA: Insufficient documentation

## 2012-04-07 DIAGNOSIS — R45851 Suicidal ideations: Secondary | ICD-10-CM | POA: Insufficient documentation

## 2012-04-07 LAB — CBC WITH DIFFERENTIAL/PLATELET
Eosinophils Absolute: 0.1 10*3/uL (ref 0.0–0.7)
Hemoglobin: 11.8 g/dL — ABNORMAL LOW (ref 12.0–15.0)
Lymphocytes Relative: 44 % (ref 12–46)
Lymphs Abs: 3.8 10*3/uL (ref 0.7–4.0)
MCH: 30.3 pg (ref 26.0–34.0)
MCV: 94.3 fL (ref 78.0–100.0)
Monocytes Relative: 4 % (ref 3–12)
Neutrophils Relative %: 52 % (ref 43–77)
RBC: 3.89 MIL/uL (ref 3.87–5.11)

## 2012-04-07 LAB — RAPID URINE DRUG SCREEN, HOSP PERFORMED
Amphetamines: NOT DETECTED
Opiates: NOT DETECTED

## 2012-04-07 LAB — COMPREHENSIVE METABOLIC PANEL
Alkaline Phosphatase: 90 U/L (ref 39–117)
BUN: 12 mg/dL (ref 6–23)
CO2: 24 mEq/L (ref 19–32)
GFR calc Af Amer: >90 mL/min (ref >90)
GFR calc non Af Amer: >90 mL/min (ref >90)
Glucose, Bld: 121 mg/dL — ABNORMAL HIGH (ref 70–99)
Potassium: 3.7 mEq/L (ref 3.5–5.1)
Total Bilirubin: 0.2 mg/dL — ABNORMAL LOW (ref 0.3–1.2)
Total Protein: 7.9 g/dL (ref 6.0–8.3)

## 2012-04-07 LAB — ETHANOL: Alcohol, Ethyl (B): <11 mg/dL (ref 0–11)

## 2012-04-07 MED ORDER — ALUM & MAG HYDROXIDE-SIMETH 200-200-20 MG/5ML PO SUSP: 30.0000 mL | ORAL | Status: DC | PRN

## 2012-04-07 MED ORDER — NICOTINE 21 MG/24HR TD PT24
21.0000 mg | MEDICATED_PATCH | Freq: Every day | TRANSDERMAL | Status: DC
  Administered 2012-04-07: 21 mg via TRANSDERMAL
  Filled 2012-04-07: qty 1

## 2012-04-07 MED ORDER — QUETIAPINE FUMARATE 50 MG PO TABS
50.0000 mg | ORAL_TABLET | Freq: Two times a day (BID) | ORAL | Status: DC
  Administered 2012-04-07: 50 mg via ORAL
  Filled 2012-04-07: qty 1

## 2012-04-07 MED ORDER — ZOLPIDEM TARTRATE 5 MG PO TABS: 5.0000 mg | ORAL_TABLET | Freq: Every evening | ORAL | Status: DC | PRN

## 2012-04-07 MED ORDER — QUETIAPINE FUMARATE 50 MG PO TABS: 50.0000 mg | ORAL_TABLET | Freq: Every day | ORAL | Status: DC

## 2012-04-07 MED ORDER — TRAZODONE HCL 100 MG PO TABS: 100.0000 mg | ORAL_TABLET | Freq: Every day | ORAL | Status: DC

## 2012-04-07 MED ORDER — LORAZEPAM 1 MG PO TABS
1.0000 mg | ORAL_TABLET | Freq: Three times a day (TID) | ORAL | Status: DC | PRN
  Administered 2012-04-07: 1 mg via ORAL
  Filled 2012-04-07: qty 1

## 2012-04-07 MED ORDER — ACETAMINOPHEN 325 MG PO TABS: 650.0000 mg | ORAL_TABLET | ORAL | Status: DC | PRN

## 2012-04-07 MED ORDER — IBUPROFEN 600 MG PO TABS
600.0000 mg | ORAL_TABLET | Freq: Three times a day (TID) | ORAL | Status: DC | PRN
  Administered 2012-04-07: 600 mg via ORAL
  Filled 2012-04-07: qty 1

## 2012-04-07 MED ORDER — QUETIAPINE FUMARATE 25 MG PO TABS: 25.0000 mg | ORAL_TABLET | Freq: Two times a day (BID) | ORAL | Status: DC

## 2012-04-07 MED ORDER — ONDANSETRON HCL 4 MG PO TABS: 4.0000 mg | ORAL_TABLET | Freq: Three times a day (TID) | ORAL | Status: DC | PRN

## 2012-04-07 NOTE — ED Notes (Signed)
Patient reports that she is hearing voices that tell her to hurt other people and tell her to run in front of cars. Patient states she tries to get them to calm down, but it gets worse. Patient denies having a plan. Patient is tearful.

## 2012-04-07 NOTE — ED Notes (Signed)
Patient discharge with written and verbal instructions. Patient also was given her appointment date to [facility] 04/08/12 at 11:15 pm. Patient  Verbalized understanding. Patient denies hearing voices at this time.

## 2012-04-07 NOTE — BH Assessment (Signed)
Per Mellody Life RN, pt accepted to [facility] pending available bed on [address]. Armanda Heritage to Jannifer Franklin MD.  Evette Cristal, LCSWA Assessment Counselor
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
## 2012-04-07 NOTE — ED Notes (Signed)
Tele pysch in progress 

## 2012-04-07 NOTE — ED Provider Notes (Signed)
History  This chart was scribed for non-physician practitioner, Magnus Sinning, PA-C working with Lyanne Co, MD by Shari Heritage, ED Scribe. This patient was seen in room WTR2/WLPT2 and the patient's care was started at 1835.   CSN: [id number]  Arrival date & time 04/07/12  1806   First MD Initiated Contact with Patient 04/07/12 1835      Chief Complaint  Patient presents with  . Medical Clearance  . Homicidal  . Suicidal    The history is provided by the patient. No language interpreter was used.    HPI Comments: [name] is a 27 y.o. female with medical history of Schizoaffective disorder, anxiety, depression, brain tumor and epilepsy who presents to the Emergency Department with suicidal and homicidal ideation. Patient states that she does not have a suicidal or homicidal plan, and that she has come to the ED to get help before she acts on these feelings. Patient also reports auditory hallucinations and visual hallucinations. She states that the voices in her head are telling her to hurt herself and others. She states that she is seeing a demon in the hallway at this time, which is why she is covering her head with a sheet. Patient denies any drug or alcohol use today.  Patient has been prescribed Desyrel, Haldol, Xanax and Seroquel, but she hasn't taken them regularly for about 3 months because she is out.   She denies any physical complaints at this time.  Past Medical History  Diagnosis Date  . 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 Brain tumor   . Schizoaffective disorder   . Anxiety   . Depression   . Seizures   . Kidney stones   . Kidney stone   . Cancer 1991    ovarian    Past Surgical History  Procedure Laterality Date  . Abdominal hysterectomy      partial  . Kidney stone removal    . Appendectomy      Family History  Problem Relation Age of Onset  . Diabetes type II Other     History  Substance Use Topics  . Smoking status:  Current Every Day Smoker -- 1.00 packs/day    Types: Cigarettes  . Smokeless tobacco: Never Used  . Alcohol Use: No    OB History   Grav Para Term Preterm Abortions TAB SAB Ect Mult Living                  Review of Systems  Gastrointestinal: Negative for nausea.  Psychiatric/Behavioral: Positive for suicidal ideas and hallucinations.  All other systems reviewed and are negative.    Allergies  Ketorolac tromethamine; Lortab; and Tramadol  Home Medications   Current Outpatient Rx  Name  Route  Sig  Dispense  Refill  . ALPRAZolam (XANAX) 0.5 MG tablet   Oral   Take 1 tablet (0.5 mg total) by mouth at bedtime as needed for sleep.   6 tablet   0   . QUEtiapine (SEROQUEL) 25 MG tablet   Oral   Take 1 tablet (25 mg total) by mouth 2 (two) times daily.   20 tablet   0   . traZODone (DESYREL) 100 MG tablet   Oral   Take 100 mg by mouth at bedtime.         . haloperidol (HALDOL) 2 MG/ML solution   Oral   Take 1.5 mg by mouth every 21 ( twenty-one) days.  Triage Vitals: BP 154/103  Pulse 87  Temp(Src) 98.8 F (37.1 C) (Oral)  SpO2 97%  Physical Exam  Nursing note and vitals reviewed. Constitutional: She appears well-developed and well-nourished.  HENT:  Head: Normocephalic and atraumatic.  Mouth/Throat: Oropharynx is clear and moist.  Eyes: EOM are normal. Pupils are equal, round, and reactive to light.  Neck: Normal range of motion. Neck supple.  Cardiovascular: Normal rate, regular rhythm and normal heart sounds.   No murmur heard. Pulmonary/Chest: Effort normal and breath sounds normal. No respiratory distress. She has no wheezes. She has no rales.  Musculoskeletal: Normal range of motion.  Neurological: She is alert.  Skin: Skin is warm and dry.  Psychiatric: Her speech is normal. She is actively hallucinating. She exhibits a depressed mood. She expresses homicidal and suicidal ideation. She expresses no suicidal plans and no homicidal  plans.    ED Course  Procedures (including critical care time) DIAGNOSTIC STUDIES: Oxygen Saturation is 97% on room air, adequate by my interpretation.    COORDINATION OF CARE: 7:20 PM- Patient informed of current plan for treatment and evaluation and agrees with plan at this time.   7:28 PM- Discussed case with Dr. Patria Mane.  Labs Reviewed  CBC WITH DIFFERENTIAL - Abnormal; Notable for the following:    Hemoglobin 11.8 (*)    Platelets 419 (*)    All other components within normal limits  COMPREHENSIVE METABOLIC PANEL - Abnormal; Notable for the following:    Glucose, Bld 121 (*)    Total Bilirubin 0.2 (*)    All other components within normal limits  URINE RAPID DRUG SCREEN (HOSP PERFORMED) - Abnormal; Notable for the following:    Tetrahydrocannabinol POSITIVE (*)    All other components within normal limits  ETHANOL   No results found.   No diagnosis found.  Discussed case with ACT team.  MDM  Patient with a history of Schizoaffective Disorder presents today with a chief complaint of Auditory Hallucinations, Visual Hallucinations, SI, and HI.  ACT team has been notified.  Psych holding orders have been placed.  Labs unremarkable.  I personally performed the services described in this documentation, which was scribed in my presence. The recorded information has been reviewed and is accurate.    Pascal Lux [place], PA-C 04/08/12 [phone]

## 2012-04-08 ENCOUNTER — Telehealth (HOSPITAL_COMMUNITY): Payer: Self-pay

## 2012-04-08 ENCOUNTER — Encounter (HOSPITAL_COMMUNITY): Payer: Self-pay

## 2012-04-08 ENCOUNTER — Ambulatory Visit (HOSPITAL_COMMUNITY)
Admission: RE | Admit: 2012-04-08 | Discharge: 2012-04-08 | Disposition: A | Discharge status: Home / Self Care | Payer: Medicaid Other | Attending: Psychiatry | Admitting: Psychiatry

## 2012-04-08 NOTE — H&P (Signed)
Behavioral Health Medical Screening Exam  [name] is an 27 y.o. female.  Review of Systems  All other systems reviewed and are negative.    Physical Exam  Constitutional: She is oriented to person, place, and time. She appears well-developed.  HENT:  Head: Normocephalic.  Eyes: Pupils are equal, round, and reactive to light.  Neck: No thyromegaly present.  Cardiovascular: Normal rate.   Respiratory: Effort normal.  GI: Soft. Bowel sounds are normal.  Neurological: She is oriented to person, place, and time.  Skin: Skin is warm and dry.  Psychiatric:  Poor hygiene with good eye contact. Proper affect some what agitated     There were no vitals taken for this visit.  Recommendations:  Based on my evaluation the patient does not appear to have an emergency medical condition. Pt advised to follow up with with OP psychiatric services per tele psych recommendations prompting her D/C from [facility] ED prior to her presenting too [facility].  Chief Walkup E 04/08/2012, 4:14 AM
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
## 2012-04-08 NOTE — ED Provider Notes (Signed)
Medical screening examination/treatment/procedure(s) were performed by non-physician practitioner and as supervising physician I was immediately available for consultation/collaboration.   Lyanne Co, MD 04/08/12 2253

## 2012-04-11 ENCOUNTER — Emergency Department (HOSPITAL_COMMUNITY)
Admission: EM | Admit: 2012-04-11 | Discharge: 2012-04-12 | Disposition: A | Discharge status: Home / Self Care | Payer: Medicaid Other | Attending: Emergency Medicine | Admitting: Emergency Medicine

## 2012-04-11 ENCOUNTER — Encounter (HOSPITAL_COMMUNITY): Payer: Self-pay | Admitting: Emergency Medicine

## 2012-04-11 DIAGNOSIS — F17200 Nicotine dependence, unspecified, uncomplicated: Secondary | ICD-10-CM | POA: Insufficient documentation

## 2012-04-11 DIAGNOSIS — G40309 Generalized idiopathic epilepsy and epileptic syndromes, not intractable, without status epilepticus: Secondary | ICD-10-CM | POA: Insufficient documentation

## 2012-04-11 DIAGNOSIS — Z8543 Personal history of malignant neoplasm of ovary: Secondary | ICD-10-CM | POA: Insufficient documentation

## 2012-04-11 DIAGNOSIS — R109 Unspecified abdominal pain: Secondary | ICD-10-CM | POA: Insufficient documentation

## 2012-04-11 DIAGNOSIS — F411 Generalized anxiety disorder: Secondary | ICD-10-CM | POA: Insufficient documentation

## 2012-04-11 DIAGNOSIS — F329 Major depressive disorder, single episode, unspecified: Secondary | ICD-10-CM | POA: Insufficient documentation

## 2012-04-11 DIAGNOSIS — Z87442 Personal history of urinary calculi: Secondary | ICD-10-CM | POA: Insufficient documentation

## 2012-04-11 DIAGNOSIS — Z86011 Personal history of benign neoplasm of the brain: Secondary | ICD-10-CM | POA: Insufficient documentation

## 2012-04-11 DIAGNOSIS — R112 Nausea with vomiting, unspecified: Secondary | ICD-10-CM | POA: Insufficient documentation

## 2012-04-11 DIAGNOSIS — M7989 Other specified soft tissue disorders: Secondary | ICD-10-CM | POA: Insufficient documentation

## 2012-04-11 DIAGNOSIS — F3289 Other specified depressive episodes: Secondary | ICD-10-CM | POA: Insufficient documentation

## 2012-04-11 DIAGNOSIS — Z79899 Other long term (current) drug therapy: Secondary | ICD-10-CM | POA: Insufficient documentation

## 2012-04-11 LAB — CBC WITH DIFFERENTIAL/PLATELET
Basophils Absolute: 0 10*3/uL (ref 0.0–0.1)
HCT: 35.2 % — ABNORMAL LOW (ref 36.0–46.0)
Lymphocytes Relative: 47 % — ABNORMAL HIGH (ref 12–46)
Neutro Abs: 3.5 10*3/uL (ref 1.7–7.7)
Neutrophils Relative %: 47 % (ref 43–77)
Platelets: 366 10*3/uL (ref 150–400)
RDW: 13.1 % (ref 11.5–15.5)
WBC: 7.4 10*3/uL (ref 4.0–10.5)

## 2012-04-11 LAB — COMPREHENSIVE METABOLIC PANEL
ALT: 18 U/L (ref 0–35)
AST: 20 U/L (ref 0–37)
Albumin: 3.8 g/dL (ref 3.5–5.2)
CO2: 25 mEq/L (ref 19–32)
Chloride: 103 mEq/L (ref 96–112)
GFR calc non Af Amer: >90 mL/min (ref >90)
Potassium: 3.5 mEq/L (ref 3.5–5.1)
Sodium: 139 mEq/L (ref 135–145)
Total Bilirubin: 0.2 mg/dL — ABNORMAL LOW (ref 0.3–1.2)

## 2012-04-11 MED ORDER — OXYCODONE-ACETAMINOPHEN 5-325 MG PO TABS
1.0000 | ORAL_TABLET | Freq: Once | ORAL | Status: AC
  Administered 2012-04-11: 1 via ORAL
  Filled 2012-04-11: qty 1

## 2012-04-11 MED ORDER — ONDANSETRON 8 MG PO TBDP
8.0000 mg | ORAL_TABLET | Freq: Once | ORAL | Status: AC
  Administered 2012-04-11: 8 mg via ORAL
  Filled 2012-04-11: qty 1

## 2012-04-11 NOTE — ED Notes (Signed)
Pt unable to urinate at this time.  

## 2012-04-11 NOTE — Progress Notes (Signed)
CSW received call from street watch stating patient was from [facility]. [employer]. [place], [place] [address]. If patient is discharged tonight patient can return however will need transportation assistance. CSW will assist with cab voucher to return to shelter.   Catha Gosselin, LCSWA  [phone] .04/11/2012 10:22pm

## 2012-04-11 NOTE — ED Notes (Signed)
Pt c/o L flank pain x 2-3 days. +n/v x 3 today, denies diarrhea. Pt also c/o bilat pedal edema. Pt denies SHOB. Pt A & O. PWD

## 2012-04-12 LAB — URINALYSIS, MICROSCOPIC ONLY
Glucose, UA: NEGATIVE mg/dL
Hgb urine dipstick: NEGATIVE
Ketones, ur: NEGATIVE mg/dL
Protein, ur: NEGATIVE mg/dL
pH: 6 (ref 5.0–8.0)

## 2012-04-12 MED ORDER — ONDANSETRON 8 MG PO TBDP: 8.0000 mg | ORAL_TABLET | Freq: Three times a day (TID) | ORAL | Status: DC | PRN

## 2012-04-12 NOTE — ED Provider Notes (Signed)
History     CSN: [id number]  Arrival date & time 04/11/12  2053   First MD Initiated Contact with Patient 04/11/12 2257      Chief Complaint  Patient presents with  . Flank Pain    (Consider location/radiation/quality/duration/timing/severity/associated sxs/prior treatment) The history is provided by the patient.   patient reports several days of intermittent left flank pain with associated nausea and vomiting.  She reports her pain is improving at this time but she continues to be nauseated.  No urinary complaints.  No fevers or chills.  No anterior abdominal pain.  No chest pain or shortness of breath.  No other complaint except for mild ongoing swelling of her lower extremities which is equal bilaterally.  She denies orthopnea chest pain and exertional shortness of breath.  Past Medical History  Diagnosis Date  . 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 Brain tumor   . Schizoaffective disorder   . Anxiety   . Depression   . Seizures   . Kidney stones   . Kidney stone   . Cancer 1991    ovarian    Past Surgical History  Procedure Laterality Date  . Abdominal hysterectomy      partial  . Kidney stone removal    . Appendectomy      Family History  Problem Relation Age of Onset  . Diabetes type II Other     History  Substance Use Topics  . Smoking status: Current Every Day Smoker -- 1.00 packs/day    Types: Cigarettes  . Smokeless tobacco: Never Used  . Alcohol Use: No    OB History   Grav Para Term Preterm Abortions TAB SAB Ect Mult Living                  Review of Systems  Genitourinary: Positive for flank pain.  All other systems reviewed and are negative.    Allergies  Ketorolac tromethamine; Lortab; and Tramadol  Home Medications   Current Outpatient Rx  Name  Route  Sig  Dispense  Refill  . ALPRAZolam (XANAX) 0.5 MG tablet   Oral   Take 1 tablet (0.5 mg total) by mouth at bedtime as needed for sleep.   6 tablet   0   .  QUEtiapine (SEROQUEL) 25 MG tablet   Oral   Take 1 tablet (25 mg total) by mouth 2 (two) times daily.   20 tablet   0   . QUEtiapine (SEROQUEL) 50 MG tablet   Oral   Take 1 tablet (50 mg total) by mouth at bedtime.   30 tablet   0   . traZODone (DESYREL) 100 MG tablet   Oral   Take 100 mg by mouth at bedtime.         . haloperidol (HALDOL) 2 MG/ML solution   Oral   Take 1.5 mg by mouth every 21 ( twenty-one) days.          . ondansetron (ZOFRAN ODT) 8 MG disintegrating tablet   Oral   Take 1 tablet (8 mg total) by mouth every 8 (eight) hours as needed for nausea.   10 tablet   0     BP 142/82  Pulse 96  Temp(Src) 97.9 F (36.6 C) (Oral)  Ht 5\' 5"  (1.651 m)  SpO2 97%  Physical Exam  Nursing note and vitals reviewed. Constitutional: She is oriented to person, place, and time. She appears well-developed and well-nourished. No distress.  HENT:  Head: Normocephalic and atraumatic.  Eyes: EOM are normal.  Neck: Normal range of motion.  Cardiovascular: Normal rate, regular rhythm and normal heart sounds.   Pulmonary/Chest: Effort normal and breath sounds normal.  Abdominal: Soft. She exhibits no distension. There is no tenderness.  Musculoskeletal: Normal range of motion.  Neurological: She is alert and oriented to person, place, and time.  Skin: Skin is warm and dry.  Psychiatric: She has a normal mood and affect. Judgment normal.    ED Course  Procedures (including critical care time)  Labs Reviewed  CBC WITH DIFFERENTIAL - Abnormal; Notable for the following:    RBC 3.72 (*)    Hemoglobin 11.4 (*)    HCT 35.2 (*)    Lymphocytes Relative 47 (*)    All other components within normal limits  COMPREHENSIVE METABOLIC PANEL - Abnormal; Notable for the following:    Glucose, Bld 133 (*)    Total Bilirubin 0.2 (*)    All other components within normal limits  URINALYSIS, MICROSCOPIC ONLY - Abnormal; Notable for the following:    APPearance CLOUDY (*)     Leukocytes, UA SMALL (*)    Squamous Epithelial / LPF FEW (*)    Crystals CA OXALATE CRYSTALS (*)    All other components within normal limits  LIPASE, BLOOD   No results found.   1. Nausea & vomiting       MDM  1:34 AM The patient feels much better at this time.  She's tolerating oral fluids.  Discharge home in good condition.  Urine without signs of infection.  Vital signs are normal.  Home with PCP followup .  Her abdomen is nontender exam and she does not need imaging.  This could represent a very small recently passed left ureteral stone however no hematuria this time and her pain is controlled.  Discharge home in good condition.        Lyanne Co, MD 04/12/12 [phone]

## 2012-04-16 ENCOUNTER — Emergency Department (HOSPITAL_COMMUNITY)
Admission: EM | Admit: 2012-04-16 | Discharge: 2012-04-16 | Disposition: A | Discharge status: Home / Self Care | Payer: Medicaid Other | Attending: Emergency Medicine | Admitting: Emergency Medicine

## 2012-04-16 ENCOUNTER — Encounter (HOSPITAL_COMMUNITY): Payer: Self-pay | Admitting: Emergency Medicine

## 2012-04-16 DIAGNOSIS — M79673 Pain in unspecified foot: Secondary | ICD-10-CM

## 2012-04-16 DIAGNOSIS — F329 Major depressive disorder, single episode, unspecified: Secondary | ICD-10-CM | POA: Insufficient documentation

## 2012-04-16 DIAGNOSIS — Z87442 Personal history of urinary calculi: Secondary | ICD-10-CM | POA: Insufficient documentation

## 2012-04-16 DIAGNOSIS — F411 Generalized anxiety disorder: Secondary | ICD-10-CM | POA: Insufficient documentation

## 2012-04-16 DIAGNOSIS — Z8542 Personal history of malignant neoplasm of other parts of uterus: Secondary | ICD-10-CM | POA: Insufficient documentation

## 2012-04-16 DIAGNOSIS — Z86011 Personal history of benign neoplasm of the brain: Secondary | ICD-10-CM | POA: Insufficient documentation

## 2012-04-16 DIAGNOSIS — Z8669 Personal history of other diseases of the nervous system and sense organs: Secondary | ICD-10-CM | POA: Insufficient documentation

## 2012-04-16 DIAGNOSIS — F17200 Nicotine dependence, unspecified, uncomplicated: Secondary | ICD-10-CM | POA: Insufficient documentation

## 2012-04-16 DIAGNOSIS — F209 Schizophrenia, unspecified: Secondary | ICD-10-CM | POA: Insufficient documentation

## 2012-04-16 DIAGNOSIS — M79609 Pain in unspecified limb: Secondary | ICD-10-CM | POA: Insufficient documentation

## 2012-04-16 DIAGNOSIS — Z79899 Other long term (current) drug therapy: Secondary | ICD-10-CM | POA: Insufficient documentation

## 2012-04-16 DIAGNOSIS — F3289 Other specified depressive episodes: Secondary | ICD-10-CM | POA: Insufficient documentation

## 2012-04-16 DIAGNOSIS — M7989 Other specified soft tissue disorders: Secondary | ICD-10-CM | POA: Insufficient documentation

## 2012-04-16 MED ORDER — OXYCODONE-ACETAMINOPHEN 5-325 MG PO TABS: 1.0000 | ORAL_TABLET | Freq: Four times a day (QID) | ORAL | Status: DC | PRN

## 2012-04-16 MED ORDER — PREDNISONE 50 MG PO TABS: 50.0000 mg | ORAL_TABLET | Freq: Every day | ORAL | Status: DC

## 2012-04-16 MED ORDER — OXYCODONE-ACETAMINOPHEN 5-325 MG PO TABS
1.0000 | ORAL_TABLET | Freq: Once | ORAL | Status: AC
  Administered 2012-04-16: 1 via ORAL
  Filled 2012-04-16: qty 1

## 2012-04-16 NOTE — ED Provider Notes (Signed)
Medical screening examination/treatment/procedure(s) were conducted as a shared visit with non-physician practitioner(s) and myself.  I personally evaluated the patient during the encounter.  Patient is tender over the dorsum of her foot. No trauma. No clinical evidence of DVT  Donnetta Hutching, MD 04/16/12 1524

## 2012-04-16 NOTE — ED Notes (Signed)
Pt presenting to ed with c/o left leg pain x 1 week pt states she was seen here for the same but something is not right because her foot continues to swell and now the pain is going up her leg

## 2012-04-16 NOTE — ED Provider Notes (Signed)
History     CSN: [id number]  Arrival date & time 04/16/12  1331   First MD Initiated Contact with Patient 04/16/12 1400      Chief Complaint  Patient presents with  . Leg Pain    (Consider location/radiation/quality/duration/timing/severity/associated sxs/prior treatment) HPI Patient is a 27 year old female who presents with left foot and ankle swelling since Friday.  Denies any shortness of breath, chest pain, fever, nausea, vomiting or diarrhea.  The right foot was also swollen initially but that has gone away.  The area is very tender.  She also complains of shoot tingling from forefoot to her calf region.  Patient states that when she comes here she usually gets dilaudid.   Past Medical History  Diagnosis Date  . 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 Brain tumor   . Schizoaffective disorder   . Anxiety   . Depression   . Seizures   . Kidney stones   . Kidney stone   . Cancer 1991    ovarian    Past Surgical History  Procedure Laterality Date  . Abdominal hysterectomy      partial  . Kidney stone removal    . Appendectomy      Family History  Problem Relation Age of Onset  . Diabetes type II Other     History  Substance Use Topics  . Smoking status: Current Every Day Smoker -- 1.00 packs/day    Types: Cigarettes  . Smokeless tobacco: Never Used  . Alcohol Use: No    OB History   Grav Para Term Preterm Abortions TAB SAB Ect Mult Living                  Review of Systems All other systems negative except as documented in the HPI. All pertinent positives and negatives as reviewed in the HPI.  Allergies  Ketorolac tromethamine; Lortab; and Tramadol  Home Medications   Current Outpatient Rx  Name  Route  Sig  Dispense  Refill  . ALPRAZolam (XANAX) 0.5 MG tablet   Oral   Take 0.5 mg by mouth 3 (three) times daily as needed (anxiety).         . haloperidol (HALDOL) 2 MG/ML solution   Oral   Take 1.5 mg by mouth every 21 ( twenty-one)  days.          . ondansetron (ZOFRAN ODT) 8 MG disintegrating tablet   Oral   Take 1 tablet (8 mg total) by mouth every 8 (eight) hours as needed for nausea.   10 tablet   0   . QUEtiapine (SEROQUEL) 50 MG tablet   Oral   Take 50 mg by mouth 2 (two) times daily.         . traZODone (DESYREL) 100 MG tablet   Oral   Take 100 mg by mouth at bedtime.           BP 162/91  Pulse 104  Temp(Src) 98.7 F (37.1 C) (Oral)  SpO2 95%  Physical Exam  Nursing note and vitals reviewed. Constitutional: She is oriented to person, place, and time. She appears well-developed and well-nourished.  HENT:  Head: Normocephalic and atraumatic.  Neck: Normal range of motion.  Pulmonary/Chest: Effort normal.  Musculoskeletal:       Left lower leg: She exhibits tenderness.       Legs: Neurological: She is alert and oriented to person, place, and time.  Psychiatric: She has a normal mood and  affect. Her behavior is normal. Judgment and thought content normal.    ED Course  Procedures (including critical care time) Patient does not have calf pain, redness, or wounds. The patient will be referred to her PCP. Ice and elevate the foot. No injury or trauma to the foot.   MDM          Carlyle Dolly, PA-C 04/16/12 1451

## 2012-05-14 ENCOUNTER — Emergency Department (HOSPITAL_COMMUNITY): Payer: Medicaid Other

## 2012-05-14 ENCOUNTER — Encounter (HOSPITAL_COMMUNITY): Payer: Self-pay | Admitting: *Deleted

## 2012-05-14 ENCOUNTER — Emergency Department (HOSPITAL_COMMUNITY)
Admission: EM | Admit: 2012-05-14 | Discharge: 2012-05-15 | Disposition: A | Discharge status: Home / Self Care | Payer: Medicaid Other | Attending: Emergency Medicine | Admitting: Emergency Medicine

## 2012-05-14 DIAGNOSIS — Z79899 Other long term (current) drug therapy: Secondary | ICD-10-CM | POA: Insufficient documentation

## 2012-05-14 DIAGNOSIS — R079 Chest pain, unspecified: Secondary | ICD-10-CM | POA: Insufficient documentation

## 2012-05-14 DIAGNOSIS — F17200 Nicotine dependence, unspecified, uncomplicated: Secondary | ICD-10-CM | POA: Insufficient documentation

## 2012-05-14 DIAGNOSIS — Z87442 Personal history of urinary calculi: Secondary | ICD-10-CM | POA: Insufficient documentation

## 2012-05-14 DIAGNOSIS — Z8659 Personal history of other mental and behavioral disorders: Secondary | ICD-10-CM | POA: Insufficient documentation

## 2012-05-14 DIAGNOSIS — R112 Nausea with vomiting, unspecified: Secondary | ICD-10-CM | POA: Insufficient documentation

## 2012-05-14 DIAGNOSIS — F259 Schizoaffective disorder, unspecified: Secondary | ICD-10-CM | POA: Insufficient documentation

## 2012-05-14 DIAGNOSIS — Z8543 Personal history of malignant neoplasm of ovary: Secondary | ICD-10-CM | POA: Insufficient documentation

## 2012-05-14 DIAGNOSIS — R05 Cough: Secondary | ICD-10-CM

## 2012-05-14 DIAGNOSIS — G40909 Epilepsy, unspecified, not intractable, without status epilepticus: Secondary | ICD-10-CM | POA: Insufficient documentation

## 2012-05-14 DIAGNOSIS — R059 Cough, unspecified: Secondary | ICD-10-CM | POA: Insufficient documentation

## 2012-05-14 DIAGNOSIS — R109 Unspecified abdominal pain: Secondary | ICD-10-CM | POA: Insufficient documentation

## 2012-05-14 DIAGNOSIS — Z3202 Encounter for pregnancy test, result negative: Secondary | ICD-10-CM | POA: Insufficient documentation

## 2012-05-14 DIAGNOSIS — R197 Diarrhea, unspecified: Secondary | ICD-10-CM | POA: Insufficient documentation

## 2012-05-14 LAB — CBC WITH DIFFERENTIAL/PLATELET
Basophils Relative: 0 % (ref 0–1)
Hemoglobin: 12.1 g/dL (ref 12.0–15.0)
Lymphs Abs: 4 10*3/uL (ref 0.7–4.0)
Monocytes Relative: 6 % (ref 3–12)
Neutro Abs: 5.5 10*3/uL (ref 1.7–7.7)
Neutrophils Relative %: 54 % (ref 43–77)
RBC: 3.99 MIL/uL (ref 3.87–5.11)

## 2012-05-14 LAB — POCT I-STAT, CHEM 8
BUN: 16 mg/dL (ref 6–23)
Calcium, Ion: 1.13 mmol/L (ref 1.12–1.23)
Chloride: 105 mEq/L (ref 96–112)
Potassium: 3.6 mEq/L (ref 3.5–5.1)

## 2012-05-14 LAB — URINALYSIS, ROUTINE W REFLEX MICROSCOPIC
Bilirubin Urine: NEGATIVE
Ketones, ur: NEGATIVE mg/dL
Leukocytes, UA: NEGATIVE
Nitrite: NEGATIVE
Urobilinogen, UA: 1 mg/dL (ref 0.0–1.0)
pH: 7 (ref 5.0–8.0)

## 2012-05-14 LAB — BASIC METABOLIC PANEL
BUN: 15 mg/dL (ref 6–23)
Chloride: 101 mEq/L (ref 96–112)
GFR calc Af Amer: 85 mL/min — ABNORMAL LOW (ref >90)
Glucose, Bld: 97 mg/dL (ref 70–99)
Potassium: 3.6 mEq/L (ref 3.5–5.1)
Sodium: 139 mEq/L (ref 135–145)

## 2012-05-14 MED ORDER — SODIUM CHLORIDE 0.9 % IV BOLUS (SEPSIS)
1000.0000 mL | INTRAVENOUS | Status: AC
  Administered 2012-05-14: 1000 mL via INTRAVENOUS

## 2012-05-14 MED ORDER — HYDROMORPHONE HCL PF 1 MG/ML IJ SOLN
1.0000 mg | Freq: Once | INTRAMUSCULAR | Status: AC
  Administered 2012-05-14: 1 mg via INTRAVENOUS
  Filled 2012-05-14: qty 1

## 2012-05-14 NOTE — ED Provider Notes (Signed)
History     CSN: [id number]  Arrival date & time 05/14/12  2002   First MD Initiated Contact with Patient 05/14/12 2248      Chief Complaint  Patient presents with  . Abdominal Pain    (Consider location/radiation/quality/duration/timing/severity/associated sxs/prior treatment) HPI Comments: This is a 27 year old female, who presents emergency department with chief complaint of cough. Patient states that she has been coughing for the past 3 weeks. She also endorses some nausea, vomiting, diarrhea. She states that she has taken cough medicine with codeine, which has helped significantly. Additionally, she is requesting a refill of her medications, as she has not been able to establish with a psychiatrist in [place].  She endorses chest pain with coughing.  No dysuria, or vaginal discharge.  The history is provided by the patient. No language interpreter was used.    Past Medical History  Diagnosis Date  . 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 Brain tumor   . Schizoaffective disorder   . Anxiety   . Depression   . Seizures   . Kidney stones   . Kidney stone   . Cancer 1991    ovarian    Past Surgical History  Procedure Laterality Date  . Abdominal hysterectomy      partial  . Kidney stone removal    . Appendectomy      Family History  Problem Relation Age of Onset  . Diabetes type II Other     History  Substance Use Topics  . Smoking status: Current Every Day Smoker -- 1.00 packs/day    Types: Cigarettes  . Smokeless tobacco: Never Used  . Alcohol Use: No    OB History   Grav Para Term Preterm Abortions TAB SAB Ect Mult Living                  Review of Systems  All other systems reviewed and are negative.    Allergies  Ketorolac tromethamine; Lortab; and Tramadol  Home Medications   Current Outpatient Rx  Name  Route  Sig  Dispense  Refill  . ALPRAZolam (XANAX) 1 MG tablet   Oral   Take 1 mg by mouth 3 (three) times daily as  needed for anxiety.         Marland Kitchen dextromethorphan (DELSYM) 30 MG/5ML liquid   Oral   Take 60 mg by mouth 2 (two) times daily as needed for cough.         . haloperidol (HALDOL) 2 MG/ML solution   Injection   Inject 1.5 mg as directed every 21 ( twenty-one) days.          Marland Kitchen QUEtiapine (SEROQUEL) 50 MG tablet   Oral   Take 50 mg by mouth 2 (two) times daily.           BP 125/89  Pulse 91  Temp(Src) 97.8 F (36.6 C) (Oral)  Resp 33  SpO2 97%  Physical Exam  Nursing note and vitals reviewed. Constitutional: She is oriented to person, place, and time. She appears well-developed and well-nourished.  HENT:  Head: Normocephalic and atraumatic.  Eyes: Conjunctivae and EOM are normal. Pupils are equal, round, and reactive to light.  Neck: Normal range of motion. Neck supple.  Cardiovascular: Normal rate and regular rhythm.  Exam reveals no gallop and no friction rub.   No murmur heard. Pulmonary/Chest: Effort normal and breath sounds normal. No respiratory distress. She has no wheezes. She has no rales. She exhibits no  tenderness.  Abdominal: Soft. Bowel sounds are normal. She exhibits no distension and no mass. There is no tenderness. There is no rebound and no guarding.  Musculoskeletal: Normal range of motion. She exhibits no edema and no tenderness.  Neurological: She is alert and oriented to person, place, and time.  Skin: Skin is warm and dry.  Psychiatric: She has a normal mood and affect. Her behavior is normal. Judgment and thought content normal.    ED Course  Procedures (including critical care time)  Labs Reviewed  URINALYSIS, ROUTINE W REFLEX MICROSCOPIC - Abnormal; Notable for the following:    APPearance HAZY (*)    All other components within normal limits  CBC WITH DIFFERENTIAL  BASIC METABOLIC PANEL   Dg Chest 2 View  05/14/2012  *RADIOLOGY REPORT*  Clinical Data: Abdominal pain, shortness of breath and cough.  CHEST - 2 VIEW  Comparison: 03/11/2012   Findings: Chest normal no pneumothorax.  Mediastinal contours appear intact.  No significant change since previous study.  IMPRESSION: No evidence of active pulmonary disease.   Original Report Authenticated By: Burman Nieves, M.D.    Results for orders placed during the hospital encounter of 05/14/12  URINALYSIS, ROUTINE W REFLEX MICROSCOPIC      Result Value Range   Color, Urine YELLOW  YELLOW   APPearance HAZY (*) CLEAR   Specific Gravity, Urine 1.027  1.005 - 1.030   pH 7.0  5.0 - 8.0   Glucose, UA NEGATIVE  NEGATIVE mg/dL   Hgb urine dipstick NEGATIVE  NEGATIVE   Bilirubin Urine NEGATIVE  NEGATIVE   Ketones, ur NEGATIVE  NEGATIVE mg/dL   Protein, ur NEGATIVE  NEGATIVE mg/dL   Urobilinogen, UA 1.0  0.0 - 1.0 mg/dL   Nitrite NEGATIVE  NEGATIVE   Leukocytes, UA NEGATIVE  NEGATIVE  CBC WITH DIFFERENTIAL      Result Value Range   WBC 10.2  4.0 - 10.5 K/uL   RBC 3.99  3.87 - 5.11 MIL/uL   Hemoglobin 12.1  12.0 - 15.0 g/dL   HCT 16.1  09.6 - 04.5 %   MCV 92.5  78.0 - 100.0 fL   MCH 30.3  26.0 - 34.0 pg   MCHC 32.8  30.0 - 36.0 g/dL   RDW 40.9  81.1 - 91.4 %   Platelets 421 (*) 150 - 400 K/uL   Neutrophils Relative 54  43 - 77 %   Neutro Abs 5.5  1.7 - 7.7 K/uL   Lymphocytes Relative 39  12 - 46 %   Lymphs Abs 4.0  0.7 - 4.0 K/uL   Monocytes Relative 6  3 - 12 %   Monocytes Absolute 0.6  0.1 - 1.0 K/uL   Eosinophils Relative 1  0 - 5 %   Eosinophils Absolute 0.1  0.0 - 0.7 K/uL   Basophils Relative 0  0 - 1 %   Basophils Absolute 0.0  0.0 - 0.1 K/uL  BASIC METABOLIC PANEL      Result Value Range   Sodium 139  135 - 145 mEq/L   Potassium 3.6  3.5 - 5.1 mEq/L   Chloride 101  96 - 112 mEq/L   CO2 26  19 - 32 mEq/L   Glucose, Bld 97  70 - 99 mg/dL   BUN 15  6 - 23 mg/dL   Creatinine, Ser 7.82  0.50 - 1.10 mg/dL   Calcium 9.3  8.4 - 95.6 mg/dL   GFR calc non Af Amer 73 (*) >90 mL/min  GFR calc Af Amer 85 (*) >90 mL/min  D-DIMER, QUANTITATIVE      Result Value Range    D-Dimer, Quant 1.04 (*) 0.00 - 0.48 ug/mL-FEU  POCT I-STAT, CHEM 8      Result Value Range   Sodium 143  135 - 145 mEq/L   Potassium 3.6  3.5 - 5.1 mEq/L   Chloride 105  96 - 112 mEq/L   BUN 16  6 - 23 mg/dL   Creatinine, Ser 1.61  0.50 - 1.10 mg/dL   Glucose, Bld 97  70 - 99 mg/dL   Calcium, Ion 0.96  0.45 - 1.23 mmol/L   TCO2 29  0 - 100 mmol/L   Hemoglobin 12.9  12.0 - 15.0 g/dL   HCT 40.9  81.1 - 91.4 %   Dg Chest 2 View  05/14/2012  *RADIOLOGY REPORT*  Clinical Data: Abdominal pain, shortness of breath and cough.  CHEST - 2 VIEW  Comparison: 03/11/2012  Findings: Chest normal no pneumothorax.  Mediastinal contours appear intact.  No significant change since previous study.  IMPRESSION: No evidence of active pulmonary disease.   Original Report Authenticated By: Burman Nieves, M.D.    Ct Angio Chest W/cm &/or Wo Cm  05/15/2012  *RADIOLOGY REPORT*  Clinical Data: Bronchitis.  Cough.  Upper abdominal pain.  White cell count 10.2.  Elevated D-dimer.  Smoker.  CT ANGIOGRAPHY CHEST  Technique:  Multidetector CT imaging of the chest using the standard protocol during bolus administration of intravenous contrast. Multiplanar reconstructed images including MIPs were obtained and reviewed to evaluate the vascular anatomy.  Contrast: OMNIPAQUE IOHEXOL 350 MG/ML SOLN  Comparison: None.  Findings: Technically limited study due to suboptimal contrast bolus.  There is moderately good opacification of the central and proximal segmental pulmonary arteries.  No filling defects are demonstrated in these vessels to suggest significant pulmonary embolus.  However, the distal sub segmental and peripheral branches are not well evaluated.  Peripheral emboli are not excluded.  Normal heart size.  Normal caliber thoracic aorta.  Increased density in the anterior mediastinum is likely due to thymic tissue. Esophagus is decompressed.  No significant lymphadenopathy in the chest.  Visualized portions of the upper  abdominal organs are grossly unremarkable.  Normal alignment of the thoracic vertebrae.  No focal consolidation in the lungs.  No evidence of airspace or interstitial disease.  No pneumothorax.  No pleural effusions. Airways appear patent.  IMPRESSION: Technically limited study due to poor contrast bolus.  No large central pulmonary emboli are demonstrated.  Peripheral emboli cannot be excluded.  No evidence of active pulmonary disease.   Original Report Authenticated By: Burman Nieves, M.D.        1. Cough       MDM  Patient with bronchitis. Complaining of pain. Will give fluids, and to check basic labs, and reassess.  Patient feeling better with pain meds.  Still tachy, will check D-dimer.  D-dimer is elevated, will order CT angio to rule out PE.  2:37 AM Discussed the patient with Dr. Norlene Campbell, who tells me that the patient may be discharged with PCP follow up.  Stating that even if there were small sub-segmental emboli, that it would not require emergent treatment.  Will DC with PCP follow up.  Patient understands and agrees with the plan.       Roxy Horseman, PA-C 05/15/12 [phone]

## 2012-05-14 NOTE — ED Notes (Signed)
Pt requested that this writer not draw her blood and that the one vein she has left be used for IV

## 2012-05-14 NOTE — ED Notes (Signed)
Pt c/o bronchitis symptoms; c/o upper abd pain; increased pain with coughing; n/v/d x 2 wks

## 2012-05-15 ENCOUNTER — Emergency Department (HOSPITAL_COMMUNITY): Payer: Medicaid Other

## 2012-05-15 LAB — D-DIMER, QUANTITATIVE: D-Dimer, Quant: 1.04 ug/mL-FEU — ABNORMAL HIGH (ref 0.00–0.48)

## 2012-05-15 MED ORDER — BENZONATATE 100 MG PO CAPS: 200.0000 mg | ORAL_CAPSULE | Freq: Two times a day (BID) | ORAL | Status: DC | PRN

## 2012-05-15 MED ORDER — ALBUTEROL SULFATE HFA 108 (90 BASE) MCG/ACT IN AERS
2.0000 | INHALATION_SPRAY | RESPIRATORY_TRACT | Status: DC | PRN
  Administered 2012-05-15: 2 via RESPIRATORY_TRACT
  Filled 2012-05-15: qty 6.7

## 2012-05-15 MED ORDER — OXYCODONE-ACETAMINOPHEN 5-325 MG PO TABS: 1.0000 | ORAL_TABLET | Freq: Four times a day (QID) | ORAL | Status: DC | PRN

## 2012-05-15 MED ORDER — IOHEXOL 350 MG/ML SOLN
100.0000 mL | Freq: Once | INTRAVENOUS | Status: AC | PRN
  Administered 2012-05-15: 100 mL via INTRAVENOUS

## 2012-05-15 MED ORDER — HYDROMORPHONE HCL PF 1 MG/ML IJ SOLN
1.0000 mg | Freq: Once | INTRAMUSCULAR | Status: AC
  Administered 2012-05-15: 1 mg via INTRAVENOUS
  Filled 2012-05-15: qty 1

## 2012-05-15 NOTE — ED Notes (Signed)
Patient transported to CT 

## 2012-05-16 NOTE — ED Provider Notes (Signed)
Medical screening examination/treatment/procedure(s) were performed by non-physician practitioner and as supervising physician I was immediately available for consultation/collaboration.  Governor Matos R. Brissia Delisa, MD 05/16/12 0004 

## 2012-05-22 ENCOUNTER — Encounter (HOSPITAL_COMMUNITY): Payer: Self-pay | Admitting: *Deleted

## 2012-05-22 ENCOUNTER — Emergency Department (HOSPITAL_COMMUNITY): Payer: Medicaid Other

## 2012-05-22 ENCOUNTER — Emergency Department (HOSPITAL_COMMUNITY)
Admission: EM | Admit: 2012-05-22 | Discharge: 2012-05-23 | Disposition: A | Discharge status: Home / Self Care | Payer: Medicaid Other | Attending: Emergency Medicine | Admitting: Emergency Medicine

## 2012-05-22 DIAGNOSIS — R05 Cough: Secondary | ICD-10-CM | POA: Insufficient documentation

## 2012-05-22 DIAGNOSIS — Z79899 Other long term (current) drug therapy: Secondary | ICD-10-CM | POA: Insufficient documentation

## 2012-05-22 DIAGNOSIS — R5383 Other fatigue: Secondary | ICD-10-CM | POA: Insufficient documentation

## 2012-05-22 DIAGNOSIS — F3289 Other specified depressive episodes: Secondary | ICD-10-CM | POA: Insufficient documentation

## 2012-05-22 DIAGNOSIS — R059 Cough, unspecified: Secondary | ICD-10-CM | POA: Insufficient documentation

## 2012-05-22 DIAGNOSIS — R112 Nausea with vomiting, unspecified: Secondary | ICD-10-CM | POA: Insufficient documentation

## 2012-05-22 DIAGNOSIS — F411 Generalized anxiety disorder: Secondary | ICD-10-CM | POA: Insufficient documentation

## 2012-05-22 DIAGNOSIS — F17200 Nicotine dependence, unspecified, uncomplicated: Secondary | ICD-10-CM | POA: Insufficient documentation

## 2012-05-22 DIAGNOSIS — R509 Fever, unspecified: Secondary | ICD-10-CM | POA: Insufficient documentation

## 2012-05-22 DIAGNOSIS — F329 Major depressive disorder, single episode, unspecified: Secondary | ICD-10-CM | POA: Insufficient documentation

## 2012-05-22 DIAGNOSIS — Z8669 Personal history of other diseases of the nervous system and sense organs: Secondary | ICD-10-CM | POA: Insufficient documentation

## 2012-05-22 DIAGNOSIS — F259 Schizoaffective disorder, unspecified: Secondary | ICD-10-CM | POA: Insufficient documentation

## 2012-05-22 DIAGNOSIS — Z8543 Personal history of malignant neoplasm of ovary: Secondary | ICD-10-CM | POA: Insufficient documentation

## 2012-05-22 DIAGNOSIS — R5381 Other malaise: Secondary | ICD-10-CM | POA: Insufficient documentation

## 2012-05-22 DIAGNOSIS — Z3202 Encounter for pregnancy test, result negative: Secondary | ICD-10-CM | POA: Insufficient documentation

## 2012-05-22 DIAGNOSIS — R197 Diarrhea, unspecified: Secondary | ICD-10-CM | POA: Insufficient documentation

## 2012-05-22 DIAGNOSIS — R109 Unspecified abdominal pain: Secondary | ICD-10-CM | POA: Insufficient documentation

## 2012-05-22 DIAGNOSIS — Z87442 Personal history of urinary calculi: Secondary | ICD-10-CM | POA: Insufficient documentation

## 2012-05-22 LAB — URINALYSIS, ROUTINE W REFLEX MICROSCOPIC
Leukocytes, UA: NEGATIVE
Nitrite: NEGATIVE
Protein, ur: 30 mg/dL — AB
Specific Gravity, Urine: 1.029 (ref 1.005–1.030)
Urobilinogen, UA: 1 mg/dL (ref 0.0–1.0)

## 2012-05-22 LAB — POCT I-STAT, CHEM 8
BUN: 13 mg/dL (ref 6–23)
Creatinine, Ser: 1 mg/dL (ref 0.50–1.10)
Potassium: 3.6 mEq/L (ref 3.5–5.1)
Sodium: 139 mEq/L (ref 135–145)
TCO2: 30 mmol/L (ref 0–100)

## 2012-05-22 LAB — CBC WITH DIFFERENTIAL/PLATELET
Basophils Absolute: 0 10*3/uL (ref 0.0–0.1)
Lymphocytes Relative: 10 % — ABNORMAL LOW (ref 12–46)
Lymphs Abs: 1.8 10*3/uL (ref 0.7–4.0)
MCV: 93.8 fL (ref 78.0–100.0)
Neutro Abs: 15.3 10*3/uL — ABNORMAL HIGH (ref 1.7–7.7)
Neutrophils Relative %: 85 % — ABNORMAL HIGH (ref 43–77)
Platelets: 330 10*3/uL (ref 150–400)
RBC: 3.72 MIL/uL — ABNORMAL LOW (ref 3.87–5.11)
RDW: 13.2 % (ref 11.5–15.5)
WBC: 17.9 10*3/uL — ABNORMAL HIGH (ref 4.0–10.5)

## 2012-05-22 LAB — POCT PREGNANCY, URINE: Preg Test, Ur: NEGATIVE

## 2012-05-22 LAB — URINE MICROSCOPIC-ADD ON

## 2012-05-22 MED ORDER — ACETAMINOPHEN 325 MG PO TABS
650.0000 mg | ORAL_TABLET | Freq: Once | ORAL | Status: DC
  Filled 2012-05-22: qty 2

## 2012-05-22 MED ORDER — DIPHENHYDRAMINE HCL 50 MG/ML IJ SOLN
25.0000 mg | Freq: Once | INTRAMUSCULAR | Status: AC
  Administered 2012-05-22: 25 mg via INTRAVENOUS
  Filled 2012-05-22: qty 1

## 2012-05-22 MED ORDER — HYDROMORPHONE HCL PF 1 MG/ML IJ SOLN
1.0000 mg | Freq: Once | INTRAMUSCULAR | Status: AC
  Administered 2012-05-22: 1 mg via INTRAVENOUS
  Filled 2012-05-22: qty 1

## 2012-05-22 MED ORDER — ONDANSETRON HCL 4 MG/2ML IJ SOLN
4.0000 mg | Freq: Once | INTRAMUSCULAR | Status: AC
  Administered 2012-05-22: 4 mg via INTRAVENOUS
  Filled 2012-05-22: qty 2

## 2012-05-22 NOTE — ED Provider Notes (Signed)
History     CSN: [id number]  Arrival date & time 05/22/12  2125   First MD Initiated Contact with Patient 05/22/12 2307      Chief Complaint  Patient presents with  . Flank Pain    (Consider location/radiation/quality/duration/timing/severity/associated sxs/prior treatment) HPI Comments: Patient presents with left flank pain. She states it's been going on about 3 weeks. She says it's been waxing and waning but fairly constant over last 3 weeks. She has a history of kidney stones and had lithotripsy approximately one year ago. She's also had some nausea vomiting and diarrhea associated with the pain. She has some midsternal chest pain which has also been constant over the last 3 weeks. She has a mild cough. She denies any fevers or chills although she's noted to have an elevated temperature in emergency department. She denies any urinary symptoms. She's been seen here recently for cough. She had a chest x-ray and CT of her chest to rule out PE which did not reveal any evidence of large pulmonary emboli however was a suboptimal study. She denies any calf tenderness. She notes some edema to her feet which is unchanged over the last few weeks. She states her pain is worse with movement.   Past Medical History  Diagnosis Date  . 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 Brain tumor   . Schizoaffective disorder   . Anxiety   . Depression   . Seizures   . Kidney stones   . Kidney stone   . Cancer 1991    ovarian    Past Surgical History  Procedure Laterality Date  . Abdominal hysterectomy      partial  . Kidney stone removal    . Appendectomy      Family History  Problem Relation Age of Onset  . Diabetes type II Other     History  Substance Use Topics  . Smoking status: Current Every Day Smoker -- 1.00 packs/day    Types: Cigarettes  . Smokeless tobacco: Never Used  . Alcohol Use: No    OB History   Grav Para Term Preterm Abortions TAB SAB Ect Mult Living                   Review of Systems  Constitutional: Positive for fatigue. Negative for fever, chills and diaphoresis.  HENT: Negative for congestion, rhinorrhea and sneezing.   Eyes: Negative.   Respiratory: Positive for cough and shortness of breath (mild). Negative for chest tightness.   Cardiovascular: Negative for chest pain and leg swelling.  Gastrointestinal: Positive for nausea, vomiting, abdominal pain and diarrhea. Negative for blood in stool.  Genitourinary: Negative for frequency, hematuria, flank pain and difficulty urinating.  Musculoskeletal: Negative for back pain and arthralgias.  Skin: Negative for rash.  Neurological: Negative for dizziness, speech difficulty, weakness, numbness and headaches.    Allergies  Ketorolac tromethamine; Lortab; and Tramadol  Home Medications   Current Outpatient Rx  Name  Route  Sig  Dispense  Refill  . ALPRAZolam (XANAX) 1 MG tablet   Oral   Take 1 mg by mouth 3 (three) times daily as needed for anxiety.         . haloperidol (HALDOL) 2 MG/ML solution   Injection   Inject 1.5 mg as directed every 21 ( twenty-one) days.          Marland Kitchen oxyCODONE-acetaminophen (PERCOCET/ROXICET) 5-325 MG per tablet   Oral   Take 1 tablet by mouth every 6 (  six) hours as needed for pain.   10 tablet   0   . QUEtiapine (SEROQUEL) 50 MG tablet   Oral   Take 50 mg by mouth 2 (two) times daily.         Marland Kitchen azithromycin (ZITHROMAX) 250 MG tablet   Oral   Take 1 tablet (250 mg total) by mouth daily. Take first 2 tablets together, then 1 every day until finished.   6 tablet   0   . oxyCODONE-acetaminophen (PERCOCET) 5-325 MG per tablet   Oral   Take 2 tablets by mouth every 4 (four) hours as needed for pain.   15 tablet   0     BP 138/78  Pulse 122  Temp(Src) 99 F (37.2 C) (Oral)  Resp 20  SpO2 96%  Physical Exam  Constitutional: She is oriented to person, place, and time. She appears well-developed and well-nourished.  HENT:  Head:  Normocephalic and atraumatic.  Eyes: Pupils are equal, round, and reactive to light.  Neck: Normal range of motion. Neck supple.  Cardiovascular: Normal rate, regular rhythm and normal heart sounds.   Pulmonary/Chest: Effort normal and breath sounds normal. No respiratory distress. She has no wheezes. She has no rales. She exhibits tenderness (Pain is reproducible over the sternum).  Abdominal: Soft. Bowel sounds are normal. There is no tenderness (Moderate tenderness to the left midabdomen with positive left CVA tenderness). There is no rebound and no guarding.  Musculoskeletal: Normal range of motion. She exhibits no edema.  No calf tenderness  Lymphadenopathy:    She has no cervical adenopathy.  Neurological: She is alert and oriented to person, place, and time.  Skin: Skin is warm and dry. No rash noted.  Psychiatric: She has a normal mood and affect.    ED Course  Procedures (including critical care time)  Results for orders placed during the hospital encounter of 05/22/12  URINALYSIS, ROUTINE W REFLEX MICROSCOPIC      Result Value Range   Color, Urine YELLOW  YELLOW   APPearance CLOUDY (*) CLEAR   Specific Gravity, Urine 1.029  1.005 - 1.030   pH 7.0  5.0 - 8.0   Glucose, UA NEGATIVE  NEGATIVE mg/dL   Hgb urine dipstick SMALL (*) NEGATIVE   Bilirubin Urine NEGATIVE  NEGATIVE   Ketones, ur NEGATIVE  NEGATIVE mg/dL   Protein, ur 30 (*) NEGATIVE mg/dL   Urobilinogen, UA 1.0  0.0 - 1.0 mg/dL   Nitrite NEGATIVE  NEGATIVE   Leukocytes, UA NEGATIVE  NEGATIVE  CBC WITH DIFFERENTIAL      Result Value Range   WBC 17.9 (*) 4.0 - 10.5 K/uL   RBC 3.72 (*) 3.87 - 5.11 MIL/uL   Hemoglobin 11.5 (*) 12.0 - 15.0 g/dL   HCT 40.9 (*) 81.1 - 91.4 %   MCV 93.8  78.0 - 100.0 fL   MCH 30.9  26.0 - 34.0 pg   MCHC 33.0  30.0 - 36.0 g/dL   RDW 78.2  95.6 - 21.3 %   Platelets 330  150 - 400 K/uL   Neutrophils Relative 85 (*) 43 - 77 %   Neutro Abs 15.3 (*) 1.7 - 7.7 K/uL   Lymphocytes  Relative 10 (*) 12 - 46 %   Lymphs Abs 1.8  0.7 - 4.0 K/uL   Monocytes Relative 4  3 - 12 %   Monocytes Absolute 0.8  0.1 - 1.0 K/uL   Eosinophils Relative 0  0 - 5 %   Eosinophils  Absolute 0.0  0.0 - 0.7 K/uL   Basophils Relative 0  0 - 1 %   Basophils Absolute 0.0  0.0 - 0.1 K/uL  URINE MICROSCOPIC-ADD ON      Result Value Range   Squamous Epithelial / LPF FEW (*) RARE   WBC, UA 0-2  <3 WBC/hpf   RBC / HPF 3-6  <3 RBC/hpf  POCT I-STAT, CHEM 8      Result Value Range   Sodium 139  135 - 145 mEq/L   Potassium 3.6  3.5 - 5.1 mEq/L   Chloride 103  96 - 112 mEq/L   BUN 13  6 - 23 mg/dL   Creatinine, Ser 1.47  0.50 - 1.10 mg/dL   Glucose, Bld 829 (*) 70 - 99 mg/dL   Calcium, Ion 5.62 (*) 1.12 - 1.23 mmol/L   TCO2 30  0 - 100 mmol/L   Hemoglobin 12.2  12.0 - 15.0 g/dL   HCT 13.0  86.5 - 78.4 %  POCT PREGNANCY, URINE      Result Value Range   Preg Test, Ur NEGATIVE  NEGATIVE   Ct Abdomen Pelvis Wo Contrast  05/22/2012  *RADIOLOGY REPORT*  Clinical Data: Left flank pain.  CT ABDOMEN AND PELVIS WITHOUT CONTRAST  Technique:  Multidetector CT imaging of the abdomen and pelvis was performed following the standard protocol without intravenous contrast.  Comparison: 01/22/2012.  Findings: The lung bases are clear.  No pleural effusion.  The solid abdominal organs are unremarkable without contrast.  No renal or obstructing ureteral calculi.  No bladder calculi.  The stomach, duodenum, small bowel and colon are grossly normal without oral contrast.  There are surgical changes from an appendectomy.  The uterus is normal and stable.  The bladder is normal.  No pelvic mass or free pelvic fluid collection.  The bony structures are unremarkable.  IMPRESSION: No acute abdominal/pelvic findings.   Original Report Authenticated By: Rudie Meyer, M.D.    Dg Chest 2 View  05/23/2012  *RADIOLOGY REPORT*  Clinical Data: Fever and cough.  CHEST - 2 VIEW  Comparison: 05/14/2012.  Findings: The cardiac  silhouette, mediastinal and hilar contours are within normal limits and stable.  Low lung volumes but no infiltrates, edema or effusions.  The bony thorax is intact.  IMPRESSION: No acute cardiopulmonary findings.   Original Report Authenticated By: Rudie Meyer, M.D.    Dg Chest 2 View  05/14/2012  *RADIOLOGY REPORT*  Clinical Data: Abdominal pain, shortness of breath and cough.  CHEST - 2 VIEW  Comparison: 03/11/2012  Findings: Chest normal no pneumothorax.  Mediastinal contours appear intact.  No significant change since previous study.  IMPRESSION: No evidence of active pulmonary disease.   Original Report Authenticated By: Burman Nieves, M.D.    Ct Angio Chest W/cm &/or Wo Cm  05/15/2012  *RADIOLOGY REPORT*  Clinical Data: Bronchitis.  Cough.  Upper abdominal pain.  White cell count 10.2.  Elevated D-dimer.  Smoker.  CT ANGIOGRAPHY CHEST  Technique:  Multidetector CT imaging of the chest using the standard protocol during bolus administration of intravenous contrast. Multiplanar reconstructed images including MIPs were obtained and reviewed to evaluate the vascular anatomy.  Contrast: OMNIPAQUE IOHEXOL 350 MG/ML SOLN  Comparison: None.  Findings: Technically limited study due to suboptimal contrast bolus.  There is moderately good opacification of the central and proximal segmental pulmonary arteries.  No filling defects are demonstrated in these vessels to suggest significant pulmonary embolus.  However, the distal sub segmental and  peripheral branches are not well evaluated.  Peripheral emboli are not excluded.  Normal heart size.  Normal caliber thoracic aorta.  Increased density in the anterior mediastinum is likely due to thymic tissue. Esophagus is decompressed.  No significant lymphadenopathy in the chest.  Visualized portions of the upper abdominal organs are grossly unremarkable.  Normal alignment of the thoracic vertebrae.  No focal consolidation in the lungs.  No evidence of airspace or  interstitial disease.  No pneumothorax.  No pleural effusions. Airways appear patent.  IMPRESSION: Technically limited study due to poor contrast bolus.  No large central pulmonary emboli are demonstrated.  Peripheral emboli cannot be excluded.  No evidence of active pulmonary disease.   Original Report Authenticated By: Burman Nieves, M.D.        1. Flank pain   2. Febrile illness   3. Cough       MDM  Patient primarily presents with abdominal pain and left leg. She is febrile here she is well-appearing and nontoxic appearing. There is no evidence of sepsis. Her CT abdomen was unremarkable. There is no evidence of any stones. There's nothing to suggest pyelonephritis. She's had ongoing cough for 3 weeks. She has no other symptoms that are suggestive of pulmonary embolus. She didn't that CT and chills on her last visit which was suboptimal but there is no evidence of pulmonary embolus. She has no evidence of hypoxia. She does have an elevated white count with the worsening cough will go ahead and treat with zithromax and a short course of pain medication.  Advised to f/u with her PMD or return here if her symptoms worsen.        Rolan Bucco, MD 05/23/12 [phone]

## 2012-05-22 NOTE — ED Notes (Signed)
YNW:[id number]<[id number]> Expected date:<BR> Expected time:<BR> Means of arrival:<BR> Comments:<BR> EMS/26 yo female-N/V/D-flank pain with hx kidney stones-IV placed-administered zofran and toradol

## 2012-05-22 NOTE — ED Notes (Signed)
Pt reports that she has a kidney stone , 100.9 temp, reports n/v/d, and left flank pain. IV established enroute.  Toradol given enroute (pt reported after administration by ems that she was allergic). Zofran also given enroute for vomiting.

## 2012-05-23 MED ORDER — OXYCODONE-ACETAMINOPHEN 5-325 MG PO TABS: 2.0000 | ORAL_TABLET | ORAL | Status: DC | PRN

## 2012-05-23 MED ORDER — AZITHROMYCIN 250 MG PO TABS: 250.0000 mg | ORAL_TABLET | Freq: Every day | ORAL | Status: DC

## 2012-05-23 MED ORDER — HYDROMORPHONE HCL PF 1 MG/ML IJ SOLN
1.0000 mg | Freq: Once | INTRAMUSCULAR | Status: AC
  Administered 2012-05-23: 1 mg via INTRAVENOUS
  Filled 2012-05-23: qty 1

## 2012-06-23 ENCOUNTER — Emergency Department (HOSPITAL_COMMUNITY)
Admission: EM | Admit: 2012-06-23 | Discharge: 2012-06-24 | Discharge status: Against Medical Advice | Payer: Medicaid Other | Attending: Emergency Medicine | Admitting: Emergency Medicine

## 2012-06-23 DIAGNOSIS — F17200 Nicotine dependence, unspecified, uncomplicated: Secondary | ICD-10-CM | POA: Insufficient documentation

## 2012-06-23 DIAGNOSIS — R109 Unspecified abdominal pain: Secondary | ICD-10-CM | POA: Insufficient documentation

## 2012-06-24 NOTE — ED Notes (Signed)
Upon entering room to triage pt; pt states that she is unable to stay and has to leave; pt walks out of room and goes to room 9 to sit with ride; pt states that she does not have a ride

## 2012-08-14 ENCOUNTER — Encounter (HOSPITAL_COMMUNITY): Payer: Self-pay | Admitting: *Deleted

## 2012-08-14 ENCOUNTER — Emergency Department (HOSPITAL_COMMUNITY)
Admission: EM | Admit: 2012-08-14 | Discharge: 2012-08-14 | Disposition: A | Discharge status: Home / Self Care | Payer: Medicaid Other | Attending: Emergency Medicine | Admitting: Emergency Medicine

## 2012-08-14 DIAGNOSIS — F17200 Nicotine dependence, unspecified, uncomplicated: Secondary | ICD-10-CM | POA: Insufficient documentation

## 2012-08-14 DIAGNOSIS — Z85841 Personal history of malignant neoplasm of brain: Secondary | ICD-10-CM | POA: Insufficient documentation

## 2012-08-14 DIAGNOSIS — F3289 Other specified depressive episodes: Secondary | ICD-10-CM | POA: Insufficient documentation

## 2012-08-14 DIAGNOSIS — Z8659 Personal history of other mental and behavioral disorders: Secondary | ICD-10-CM | POA: Insufficient documentation

## 2012-08-14 DIAGNOSIS — Z3202 Encounter for pregnancy test, result negative: Secondary | ICD-10-CM | POA: Insufficient documentation

## 2012-08-14 DIAGNOSIS — F329 Major depressive disorder, single episode, unspecified: Secondary | ICD-10-CM | POA: Insufficient documentation

## 2012-08-14 DIAGNOSIS — Z87442 Personal history of urinary calculi: Secondary | ICD-10-CM | POA: Insufficient documentation

## 2012-08-14 DIAGNOSIS — Z79899 Other long term (current) drug therapy: Secondary | ICD-10-CM | POA: Insufficient documentation

## 2012-08-14 DIAGNOSIS — Z90710 Acquired absence of both cervix and uterus: Secondary | ICD-10-CM | POA: Insufficient documentation

## 2012-08-14 DIAGNOSIS — G8929 Other chronic pain: Secondary | ICD-10-CM

## 2012-08-14 DIAGNOSIS — R112 Nausea with vomiting, unspecified: Secondary | ICD-10-CM | POA: Insufficient documentation

## 2012-08-14 DIAGNOSIS — Z8543 Personal history of malignant neoplasm of ovary: Secondary | ICD-10-CM | POA: Insufficient documentation

## 2012-08-14 DIAGNOSIS — R109 Unspecified abdominal pain: Secondary | ICD-10-CM | POA: Insufficient documentation

## 2012-08-14 DIAGNOSIS — Z8669 Personal history of other diseases of the nervous system and sense organs: Secondary | ICD-10-CM | POA: Insufficient documentation

## 2012-08-14 DIAGNOSIS — F411 Generalized anxiety disorder: Secondary | ICD-10-CM | POA: Insufficient documentation

## 2012-08-14 LAB — COMPREHENSIVE METABOLIC PANEL
ALT: 14 U/L (ref 0–35)
Alkaline Phosphatase: 84 U/L (ref 39–117)
BUN: 13 mg/dL (ref 6–23)
Chloride: 108 mEq/L (ref 96–112)
GFR calc Af Amer: >90 mL/min (ref >90)
Glucose, Bld: 94 mg/dL (ref 70–99)
Potassium: 5.1 mEq/L (ref 3.5–5.1)
Sodium: 138 mEq/L (ref 135–145)
Total Bilirubin: 0.2 mg/dL — ABNORMAL LOW (ref 0.3–1.2)
Total Protein: 7.2 g/dL (ref 6.0–8.3)

## 2012-08-14 LAB — URINALYSIS, ROUTINE W REFLEX MICROSCOPIC
Glucose, UA: NEGATIVE mg/dL
Nitrite: NEGATIVE
Specific Gravity, Urine: 1.023 (ref 1.005–1.030)
pH: 6 (ref 5.0–8.0)

## 2012-08-14 LAB — PREGNANCY, URINE: Preg Test, Ur: NEGATIVE

## 2012-08-14 LAB — CBC
HCT: 36.5 % (ref 36.0–46.0)
Hemoglobin: 12 g/dL (ref 12.0–15.0)
MCHC: 32.9 g/dL (ref 30.0–36.0)
RBC: 3.95 MIL/uL (ref 3.87–5.11)
WBC: 6.3 10*3/uL (ref 4.0–10.5)

## 2012-08-14 LAB — URINE MICROSCOPIC-ADD ON

## 2012-08-14 MED ORDER — ONDANSETRON HCL 4 MG/2ML IJ SOLN
4.0000 mg | Freq: Once | INTRAMUSCULAR | Status: AC
  Administered 2012-08-14: 4 mg via INTRAMUSCULAR

## 2012-08-14 MED ORDER — ONDANSETRON HCL 4 MG/2ML IJ SOLN
4.0000 mg | Freq: Once | INTRAMUSCULAR | Status: DC
  Filled 2012-08-14: qty 2

## 2012-08-14 MED ORDER — FENTANYL CITRATE 0.05 MG/ML IJ SOLN
50.0000 ug | Freq: Once | INTRAMUSCULAR | Status: AC
  Administered 2012-08-14: 50 ug via INTRAMUSCULAR

## 2012-08-14 MED ORDER — FENTANYL CITRATE 0.05 MG/ML IJ SOLN
50.0000 ug | Freq: Once | INTRAMUSCULAR | Status: DC
  Filled 2012-08-14: qty 2

## 2012-08-14 MED ORDER — OXYCODONE-ACETAMINOPHEN 5-325 MG PO TABS: 2.0000 | ORAL_TABLET | ORAL | Status: DC | PRN

## 2012-08-14 NOTE — ED Provider Notes (Signed)
History     CSN: [id number]  Arrival date & time 08/14/12  0017   First MD Initiated Contact with Patient 08/14/12 0047      Chief Complaint  Patient presents with  . Flank Pain   HPI [name] is a 27 y.o. female with a history of kidney stones presents with left flank pain. She says she's had flank pain as well as nausea and vomiting for several days. She says she typically goes to [facility] however some [place] today. She says typically "they give me an IV, fluids and Dilaudid." Patient says she had a stone a few months ago and she has a followup appointment with [facility].  Pain is severe, left flank, radiates to the front. Patient has multiple allergies and has not taken anything at home.   Past Medical History  Diagnosis Date  . 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 Brain tumor   . Schizoaffective disorder   . Anxiety   . Depression   . Seizures   . Kidney stones   . Kidney stone   . Cancer 1991    ovarian    Past Surgical History  Procedure Laterality Date  . Abdominal hysterectomy      partial  . Kidney stone removal    . Appendectomy      Family History  Problem Relation Age of Onset  . Diabetes type II Other     History  Substance Use Topics  . Smoking status: Current Every Day Smoker -- 1.00 packs/day    Types: Cigarettes  . Smokeless tobacco: Never Used  . Alcohol Use: No    OB History   Grav Para Term Preterm Abortions TAB SAB Ect Mult Living                  Review of Systems At least 10pt or greater review of systems completed and are negative except where specified in the HPI.  Allergies  Ketorolac tromethamine; Lortab; and Tramadol  Home Medications   Current Outpatient Rx  Name  Route  Sig  Dispense  Refill  . albuterol (PROVENTIL HFA;VENTOLIN HFA) 108 (90 BASE) MCG/ACT inhaler   Inhalation   Inhale 2 puffs into the lungs every 6 (six) hours as needed for wheezing or shortness of breath.          . ALPRAZolam (XANAX) 1 MG tablet   Oral   Take 1 mg by mouth 3 (three) times daily as needed for anxiety.         Marland Kitchen oxyCODONE-acetaminophen (PERCOCET/ROXICET) 5-325 MG per tablet   Oral   Take 1 tablet by mouth every 4 (four) hours as needed for pain.           BP 131/78  Pulse 79  Temp(Src) 98.2 F (36.8 C) (Oral)  Resp 16  SpO2 100%  Physical Exam  Nursing notes reviewed.  Electronic medical record reviewed. VITAL SIGNS:   Filed Vitals:   08/14/12 0019 08/14/12 0158 08/14/12 0346  BP: 155/99 131/78 139/82  Pulse: 94 79 63  Temp: 98.2 F (36.8 C)    TempSrc: Oral    Resp: 16  18  SpO2: 99% 100% 99%   CONSTITUTIONAL: Awake, oriented, appears non-toxic HENT: Atraumatic, normocephalic, oral mucosa pink and moist, airway patent. Nares patent without drainage. External ears normal. EYES: Conjunctiva clear, EOMI, PERRLA NECK: Trachea midline, non-tender, supple CARDIOVASCULAR: Normal heart rate, Normal rhythm, No murmurs, rubs, gallops PULMONARY/CHEST: Clear to auscultation,  no rhonchi, wheezes, or rales. Symmetrical breath sounds. Non-tender. ABDOMINAL: Non-distended, morbidly obese, soft, non-tender - no rebound or guarding.  BS normal. NEUROLOGIC: Non-focal, moving all four extremities, no gross sensory or motor deficits. EXTREMITIES: No clubbing, cyanosis, or edema SKIN: Warm, Dry, No erythema, No rash  ED Course  Procedures (including critical care time)  Labs Reviewed  URINALYSIS, ROUTINE W REFLEX MICROSCOPIC - Abnormal; Notable for the following:    APPearance CLOUDY (*)    Hgb urine dipstick TRACE (*)    Leukocytes, UA SMALL (*)    All other components within normal limits  COMPREHENSIVE METABOLIC PANEL - Abnormal; Notable for the following:    Total Bilirubin 0.2 (*)    GFR calc non Af Amer 79 (*)    All other components within normal limits  URINE MICROSCOPIC-ADD ON - Abnormal; Notable for the following:    Squamous Epithelial / LPF MANY (*)     Crystals CA OXALATE CRYSTALS (*)    All other components within normal limits  CBC  PREGNANCY, URINE   No results found.   1. Flank pain, chronic       MDM  Patient is sitting comfortably in the bed on examination, she requests Dilaudid pain medicine immediately. She does not appear uncomfortable, she does not appear to have an active kidney stone. On review of the patient's prior CT scan from March of this year, she had no stones seen in her urinary tract. I find it hard to believe that this patient has developed an obstructing stone over the course of the last 3 months, with as clinically good of a picture she has.  Patient's labs 3-6 red blood cells per high-powered field and a trace of hemoglobin.  There are calcium oxalate crystals in her urine. Other labs are unremarkable.    Patient has followup with [facility]. To be discharged with small amount of pain medicine.  No evidence for urinary obstruction or urinary tract infection. Patient has 7-10 white blood cells however no nitrites, rare bacteria, do not think this is suggestive of a urinary tract infection.        Jones Skene, MD 08/14/12 [phone]

## 2012-08-14 NOTE — ED Notes (Signed)
The pt is here with lt flank pain with nv for several days.  She reports a history of  Kidney stones and she usually goes there for treatment

## 2012-09-01 ENCOUNTER — Emergency Department (HOSPITAL_COMMUNITY)
Admission: EM | Admit: 2012-09-01 | Discharge: 2012-09-01 | Disposition: A | Discharge status: Home / Self Care | Payer: Medicaid Other | Attending: Emergency Medicine | Admitting: Emergency Medicine

## 2012-09-01 ENCOUNTER — Encounter (HOSPITAL_COMMUNITY): Payer: Self-pay | Admitting: Adult Health

## 2012-09-01 DIAGNOSIS — M549 Dorsalgia, unspecified: Secondary | ICD-10-CM

## 2012-09-01 DIAGNOSIS — F3289 Other specified depressive episodes: Secondary | ICD-10-CM | POA: Insufficient documentation

## 2012-09-01 DIAGNOSIS — R350 Frequency of micturition: Secondary | ICD-10-CM | POA: Insufficient documentation

## 2012-09-01 DIAGNOSIS — Z3202 Encounter for pregnancy test, result negative: Secondary | ICD-10-CM | POA: Insufficient documentation

## 2012-09-01 DIAGNOSIS — E669 Obesity, unspecified: Secondary | ICD-10-CM | POA: Insufficient documentation

## 2012-09-01 DIAGNOSIS — F329 Major depressive disorder, single episode, unspecified: Secondary | ICD-10-CM | POA: Insufficient documentation

## 2012-09-01 DIAGNOSIS — R109 Unspecified abdominal pain: Secondary | ICD-10-CM | POA: Insufficient documentation

## 2012-09-01 DIAGNOSIS — F17200 Nicotine dependence, unspecified, uncomplicated: Secondary | ICD-10-CM | POA: Insufficient documentation

## 2012-09-01 DIAGNOSIS — Z79899 Other long term (current) drug therapy: Secondary | ICD-10-CM | POA: Insufficient documentation

## 2012-09-01 DIAGNOSIS — G40309 Generalized idiopathic epilepsy and epileptic syndromes, not intractable, without status epilepticus: Secondary | ICD-10-CM | POA: Insufficient documentation

## 2012-09-01 DIAGNOSIS — Z86011 Personal history of benign neoplasm of the brain: Secondary | ICD-10-CM | POA: Insufficient documentation

## 2012-09-01 DIAGNOSIS — Z87442 Personal history of urinary calculi: Secondary | ICD-10-CM | POA: Insufficient documentation

## 2012-09-01 DIAGNOSIS — F411 Generalized anxiety disorder: Secondary | ICD-10-CM | POA: Insufficient documentation

## 2012-09-01 DIAGNOSIS — Z8543 Personal history of malignant neoplasm of ovary: Secondary | ICD-10-CM | POA: Insufficient documentation

## 2012-09-01 LAB — URINALYSIS, ROUTINE W REFLEX MICROSCOPIC
Bilirubin Urine: NEGATIVE
Glucose, UA: NEGATIVE mg/dL
Hgb urine dipstick: NEGATIVE
Ketones, ur: NEGATIVE mg/dL
Nitrite: NEGATIVE
Protein, ur: NEGATIVE mg/dL
Specific Gravity, Urine: 1.024 (ref 1.005–1.030)
Urobilinogen, UA: 1 mg/dL (ref 0.0–1.0)
pH: 7.5 (ref 5.0–8.0)

## 2012-09-01 LAB — URINE MICROSCOPIC-ADD ON

## 2012-09-01 LAB — POCT PREGNANCY, URINE: Preg Test, Ur: NEGATIVE

## 2012-09-01 MED ORDER — OXYCODONE-ACETAMINOPHEN 5-325 MG PO TABS: 2.0000 | ORAL_TABLET | Freq: Four times a day (QID) | ORAL | Status: DC | PRN

## 2012-09-01 MED ORDER — PROMETHAZINE HCL 25 MG PO TABS: 25.0000 mg | ORAL_TABLET | Freq: Four times a day (QID) | ORAL | Status: DC | PRN

## 2012-09-01 MED ORDER — IBUPROFEN 400 MG PO TABS
400.0000 mg | ORAL_TABLET | Freq: Once | ORAL | Status: AC
  Administered 2012-09-01: 400 mg via ORAL
  Filled 2012-09-01: qty 1

## 2012-09-01 NOTE — ED Notes (Signed)
Pt alert, NAD, calm, interactive, resps e/u, (denies: dizziness, sob or nausea)

## 2012-09-01 NOTE — ED Notes (Signed)
Pt reports lumbar back pain that began a few weeks ago. She reports, "I have been cramping in my uterus and I have low back pain. I have kidney problems. I have had both my ovaries removed, so I don't think I am pregnant. I am not se4xually active.. The only thing I can do during the daytime is sleep and eat and sleep" denies dysuria , denies hematuria.

## 2012-09-01 NOTE — ED Provider Notes (Signed)
History    CSN: [id number] Arrival date & time 09/01/12  1511  First MD Initiated Contact with Patient 09/01/12 1714     Chief Complaint  Patient presents with  . Back Pain   (Consider location/radiation/quality/duration/timing/severity/associated sxs/prior Treatment) HPI Comments: Patient is a 27 year old female who presents today with throbbing low back pain for the past 2 weeks that is gradually worsening. There was no trauma. She also admits to the sensation that her uterus is cramping for the past 3 weeks. She has taken ibuprofen and tylenol which did not help. Her symptoms are associated with urinary frequency without urgency, hematuria, or dysuria. She initially denies being sexually active for the past year, but later admits to unprotected intercourse 2 weeks ago. She does not get periods due to her ovaries being removed at age 83 secondary to ovarian cancer. She denies new vaginal discharge.    Patient is a 27 y.o. female presenting with back pain. The history is provided by the patient. No language interpreter was used.  Back Pain Associated symptoms: pelvic pain (cramping)   Associated symptoms: no fever    Past Medical History  Diagnosis Date  . 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 Brain tumor   . Schizoaffective disorder   . Anxiety   . Depression   . Seizures   . Kidney stones   . Kidney stone   . Cancer 1991    ovarian   Past Surgical History  Procedure Laterality Date  . Abdominal hysterectomy      partial  . Kidney stone removal    . Appendectomy     Family History  Problem Relation Age of Onset  . Diabetes type II Other    History  Substance Use Topics  . Smoking status: Current Every Day Smoker -- 1.00 packs/day    Types: Cigarettes  . Smokeless tobacco: Never Used  . Alcohol Use: No   OB History   Grav Para Term Preterm Abortions TAB SAB Ect Mult Living                 Review of Systems  Constitutional: Negative for fever and  chills.  Gastrointestinal: Negative for nausea and vomiting.  Genitourinary: Positive for frequency and pelvic pain (cramping). Negative for urgency.  Musculoskeletal: Positive for back pain. Negative for gait problem.  All other systems reviewed and are negative.    Allergies  Ketorolac tromethamine; Lortab; and Tramadol  Home Medications   Current Outpatient Rx  Name  Route  Sig  Dispense  Refill  . albuterol (PROVENTIL HFA;VENTOLIN HFA) 108 (90 BASE) MCG/ACT inhaler   Inhalation   Inhale 2 puffs into the lungs every 6 (six) hours as needed for wheezing or shortness of breath.         . ALPRAZolam (XANAX) 1 MG tablet   Oral   Take 1 mg by mouth 3 (three) times daily as needed for anxiety.         Marland Kitchen oxyCODONE-acetaminophen (PERCOCET/ROXICET) 5-325 MG per tablet   Oral   Take 1 tablet by mouth every 4 (four) hours as needed for pain.         Marland Kitchen oxyCODONE-acetaminophen (PERCOCET/ROXICET) 5-325 MG per tablet   Oral   Take 2 tablets by mouth every 4 (four) hours as needed for pain.   7 tablet   0    BP 154/98  Pulse 95  Temp(Src) 97.4 F (36.3 C) (Oral)  Resp 16  SpO2  100% Physical Exam  Nursing note and vitals reviewed. Constitutional: She is oriented to person, place, and time. Vital signs are normal. She appears well-developed and well-nourished.  Non-toxic appearance. She does not have a sickly appearance. She does not appear ill. No distress.  obese  HENT:  Head: Normocephalic and atraumatic.  Right Ear: External ear normal.  Left Ear: External ear normal.  Nose: Nose normal.  Mouth/Throat: Oropharynx is clear and moist.  Eyes: Conjunctivae are normal.  Neck: Normal range of motion.  Cardiovascular: Normal rate, regular rhythm and normal heart sounds.   Pulmonary/Chest: Effort normal and breath sounds normal. No stridor. No respiratory distress. She has no wheezes. She has no rales.  Abdominal: Soft. She exhibits no distension. Hernia confirmed negative in  the right inguinal area and confirmed negative in the left inguinal area.  Genitourinary: Vagina normal. Pelvic exam was performed with patient supine. There is no rash, tenderness, lesion or injury on the right labia. There is no rash, tenderness, lesion or injury on the left labia. Cervix exhibits no motion tenderness, no discharge and no friability. Right adnexum displays no mass, no tenderness and no fullness. Left adnexum displays no mass, no tenderness and no fullness. No erythema, tenderness or bleeding around the vagina. No foreign body around the vagina. No signs of injury around the vagina. No vaginal discharge found.  Musculoskeletal: Normal range of motion.       Lumbar back: She exhibits tenderness. She exhibits no bony tenderness.       Arms: Lymphadenopathy:       Right: No inguinal adenopathy present.       Left: No inguinal adenopathy present.  Neurological: She is alert and oriented to person, place, and time. She has normal strength.  Skin: Skin is warm and dry. She is not diaphoretic. No erythema.  Psychiatric: She has a normal mood and affect. Her behavior is normal.    ED Course  Procedures (including critical care time) Labs Reviewed  WET PREP, GENITAL - Abnormal; Notable for the following:    WBC, Wet Prep HPF POC FEW (*)    All other components within normal limits  URINALYSIS, ROUTINE W REFLEX MICROSCOPIC - Abnormal; Notable for the following:    APPearance CLOUDY (*)    Leukocytes, UA SMALL (*)    All other components within normal limits  URINE MICROSCOPIC-ADD ON - Abnormal; Notable for the following:    Squamous Epithelial / LPF MANY (*)    Bacteria, UA FEW (*)    Crystals CA OXALATE CRYSTALS (*)    All other components within normal limits  URINE CULTURE  GC/CHLAMYDIA PROBE AMP  POCT PREGNANCY, URINE   No results found. 1. Flank pain   2. Back pain     MDM  Patient presents with back and flank pain. She is initially concerned that it is a kidney  stone they told her would not pass. Reviewed her CT scans and discussed that recent CT scans have showed no stones. She has had multiple visits to the ED for this complaint. No red flags today. No neurological deficits and normal neuro exam.  Patient can walk without pain. No loss of bowel or bladder control.  No concern for cauda equina.  No fever, night sweats, weight loss, h/o cancer, IVDU.  Pelvic exam WNL. Return instructions given. Vital signs stable for discharge.   Mora Bellman, PA-C 09/02/12 1120

## 2012-09-02 LAB — URINE CULTURE: Colony Count: 25000

## 2012-09-02 LAB — GC/CHLAMYDIA PROBE AMP
CT Probe RNA: NEGATIVE
GC Probe RNA: POSITIVE — AB

## 2012-09-03 NOTE — ED Notes (Signed)
Would recommend not treating per Orlean Bradford

## 2012-09-03 NOTE — Progress Notes (Signed)
ED Antimicrobial Stewardship Positive Culture Follow Up   [name] is an 27 y.o. female who presented to [facility] on 09/01/2012 with a chief complaint of back pain  Chief Complaint  Patient presents with  . Back Pain    Recent Results (from the past 720 hour(s))  URINE CULTURE     Status: None   Collection Time    09/01/12  3:48 PM      Result Value Range Status   Specimen Description URINE, CLEAN CATCH   Final   Special Requests NONE   Final   Culture  Setup Time 09/01/2012 16:44   Final   Colony Count 25,000 COLONIES/ML   Final   Culture     Final   Value: GROUP B STREP(S.AGALACTIAE)ISOLATED     Note: TESTING AGAINST S. AGALACTIAE NOT ROUTINELY PERFORMED DUE TO PREDICTABILITY OF AMP/PEN/VAN SUSCEPTIBILITY.   Report Status 09/02/2012 FINAL   Final  WET PREP, GENITAL     Status: Abnormal   Collection Time    09/01/12  7:06 PM      Result Value Range Status   Yeast Wet Prep HPF POC NONE SEEN  NONE SEEN Final   Trich, Wet Prep NONE SEEN  NONE SEEN Final   Clue Cells Wet Prep HPF POC NONE SEEN  NONE SEEN Final   WBC, Wet Prep HPF POC FEW (*) NONE SEEN Final  GC/CHLAMYDIA PROBE AMP     Status: Abnormal   Collection Time    09/01/12  7:06 PM      Result Value Range Status   CT Probe RNA NEGATIVE  NEGATIVE Final   GC Probe RNA POSITIVE (*) NEGATIVE Final   Comment: (NOTE)                                                                                              **Normal Reference Range: Negative**          Assay performed using the Gen-Probe APTIMA COMBO2 (R) Assay.     Acceptable specimen types for this assay include APTIMA Swabs (Unisex,     endocervical, urethral, or vaginal), first void urine, and ThinPrep     liquid based cytology samples.    []  Treated with organism resistant to prescribed antimicrobial []  Patient discharged originally without antimicrobial agent and treatment is now indicated  This patient was admitted with back pain and was worked up  for kidney stones -- which were negative. The patient only grew out 25k GBS -- likely colonized, not pregnant -- no treatment deemed necessary at this time.   New antibiotic prescription: No treatment  ED Provider: Jaynie Crumble, PA-C  [name] 09/03/2012, 9:31 AM Infectious Diseases Pharmacist Phone# [phone]

## 2012-09-03 NOTE — ED Notes (Signed)
Chart sent to EDP office for review.+ Gonorrhea  

## 2012-09-04 NOTE — ED Provider Notes (Signed)
Medical screening examination/treatment/procedure(s) were performed by non-physician practitioner and as supervising physician I was immediately available for consultation/collaboration.   Gavin Pound. Oletta Lamas, MD 09/04/12 1610

## 2012-09-07 ENCOUNTER — Telehealth (HOSPITAL_COMMUNITY): Payer: Self-pay | Admitting: Emergency Medicine

## 2012-10-04 ENCOUNTER — Encounter (HOSPITAL_COMMUNITY): Payer: Self-pay | Admitting: *Deleted

## 2012-10-04 ENCOUNTER — Emergency Department (HOSPITAL_COMMUNITY): Payer: Medicaid Other

## 2012-10-04 ENCOUNTER — Emergency Department (HOSPITAL_COMMUNITY)
Admission: EM | Admit: 2012-10-04 | Discharge: 2012-10-05 | Disposition: A | Discharge status: Home / Self Care | Payer: Medicaid Other | Attending: Emergency Medicine | Admitting: Emergency Medicine

## 2012-10-04 DIAGNOSIS — Z86011 Personal history of benign neoplasm of the brain: Secondary | ICD-10-CM | POA: Insufficient documentation

## 2012-10-04 DIAGNOSIS — F3289 Other specified depressive episodes: Secondary | ICD-10-CM | POA: Insufficient documentation

## 2012-10-04 DIAGNOSIS — F411 Generalized anxiety disorder: Secondary | ICD-10-CM | POA: Insufficient documentation

## 2012-10-04 DIAGNOSIS — Z8669 Personal history of other diseases of the nervous system and sense organs: Secondary | ICD-10-CM | POA: Insufficient documentation

## 2012-10-04 DIAGNOSIS — S298XXA Other specified injuries of thorax, initial encounter: Secondary | ICD-10-CM | POA: Insufficient documentation

## 2012-10-04 DIAGNOSIS — F17200 Nicotine dependence, unspecified, uncomplicated: Secondary | ICD-10-CM | POA: Insufficient documentation

## 2012-10-04 DIAGNOSIS — F329 Major depressive disorder, single episode, unspecified: Secondary | ICD-10-CM | POA: Insufficient documentation

## 2012-10-04 DIAGNOSIS — Z8543 Personal history of malignant neoplasm of ovary: Secondary | ICD-10-CM | POA: Insufficient documentation

## 2012-10-04 DIAGNOSIS — Z85841 Personal history of malignant neoplasm of brain: Secondary | ICD-10-CM | POA: Insufficient documentation

## 2012-10-04 DIAGNOSIS — Z87442 Personal history of urinary calculi: Secondary | ICD-10-CM | POA: Insufficient documentation

## 2012-10-04 DIAGNOSIS — F259 Schizoaffective disorder, unspecified: Secondary | ICD-10-CM | POA: Insufficient documentation

## 2012-10-04 DIAGNOSIS — R0789 Other chest pain: Secondary | ICD-10-CM

## 2012-10-04 DIAGNOSIS — Z79899 Other long term (current) drug therapy: Secondary | ICD-10-CM | POA: Insufficient documentation

## 2012-10-04 MED ORDER — OXYCODONE-ACETAMINOPHEN 5-325 MG PO TABS
2.0000 | ORAL_TABLET | Freq: Once | ORAL | Status: AC
  Administered 2012-10-04: 2 via ORAL
  Filled 2012-10-04: qty 2

## 2012-10-04 MED ORDER — ONDANSETRON 4 MG PO TBDP
4.0000 mg | ORAL_TABLET | Freq: Once | ORAL | Status: AC
  Administered 2012-10-04: 4 mg via ORAL
  Filled 2012-10-04: qty 1

## 2012-10-04 MED ORDER — OXYCODONE-ACETAMINOPHEN 5-325 MG PO TABS: 1.0000 | ORAL_TABLET | Freq: Four times a day (QID) | ORAL | Status: DC | PRN
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
## 2012-10-04 NOTE — ED Notes (Signed)
Pt c/o left sided CP that radiates to left with, SOB, diaphoresis, N/V. Pain exacerbated by movement, Pain relived by nothing. PT states SOB. She can speak in full sentences. She took muscle relaxer earlier, denies relief.

## 2012-10-04 NOTE — ED Notes (Signed)
Pt is unable to take a deep breath to speak. Pt is only able to get a few words out at a time.
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
## 2012-10-05 NOTE — ED Provider Notes (Signed)
Medical screening examination/treatment/procedure(s) were performed by non-physician practitioner and as supervising physician I was immediately available for consultation/collaboration.  Jaasiel Hollyfield, MD 10/05/12 0015 

## 2012-12-03 ENCOUNTER — Encounter (HOSPITAL_COMMUNITY): Payer: Self-pay | Admitting: Emergency Medicine

## 2012-12-03 ENCOUNTER — Emergency Department (HOSPITAL_COMMUNITY)
Admission: EM | Admit: 2012-12-03 | Discharge: 2012-12-03 | Disposition: A | Discharge status: Home / Self Care | Payer: Medicaid Other | Attending: Emergency Medicine | Admitting: Emergency Medicine

## 2012-12-03 DIAGNOSIS — R209 Unspecified disturbances of skin sensation: Secondary | ICD-10-CM | POA: Insufficient documentation

## 2012-12-03 DIAGNOSIS — G40309 Generalized idiopathic epilepsy and epileptic syndromes, not intractable, without status epilepticus: Secondary | ICD-10-CM | POA: Insufficient documentation

## 2012-12-03 DIAGNOSIS — D496 Neoplasm of unspecified behavior of brain: Secondary | ICD-10-CM | POA: Insufficient documentation

## 2012-12-03 DIAGNOSIS — Z79899 Other long term (current) drug therapy: Secondary | ICD-10-CM | POA: Insufficient documentation

## 2012-12-03 DIAGNOSIS — Z760 Encounter for issue of repeat prescription: Secondary | ICD-10-CM | POA: Insufficient documentation

## 2012-12-03 DIAGNOSIS — F3289 Other specified depressive episodes: Secondary | ICD-10-CM | POA: Insufficient documentation

## 2012-12-03 DIAGNOSIS — I10 Essential (primary) hypertension: Secondary | ICD-10-CM | POA: Insufficient documentation

## 2012-12-03 DIAGNOSIS — F329 Major depressive disorder, single episode, unspecified: Secondary | ICD-10-CM | POA: Insufficient documentation

## 2012-12-03 DIAGNOSIS — R51 Headache: Secondary | ICD-10-CM | POA: Insufficient documentation

## 2012-12-03 DIAGNOSIS — F17200 Nicotine dependence, unspecified, uncomplicated: Secondary | ICD-10-CM | POA: Insufficient documentation

## 2012-12-03 MED ORDER — METOCLOPRAMIDE HCL 10 MG PO TABS
10.0000 mg | ORAL_TABLET | Freq: Once | ORAL | Status: AC
  Administered 2012-12-03: 10 mg via ORAL
  Filled 2012-12-03: qty 1

## 2012-12-03 MED ORDER — ALPRAZOLAM 0.5 MG PO TABS
1.0000 mg | ORAL_TABLET | Freq: Once | ORAL | Status: AC
  Administered 2012-12-03: 1 mg via ORAL
  Filled 2012-12-03: qty 2

## 2012-12-03 MED ORDER — ALPRAZOLAM 1 MG PO TABS: 1.0000 mg | ORAL_TABLET | Freq: Three times a day (TID) | ORAL | Status: DC | PRN

## 2012-12-03 MED ORDER — ACETAMINOPHEN 500 MG PO TABS
1000.0000 mg | ORAL_TABLET | Freq: Once | ORAL | Status: AC
  Administered 2012-12-03: 1000 mg via ORAL
  Filled 2012-12-03: qty 2

## 2012-12-03 MED ORDER — DIPHENHYDRAMINE HCL 25 MG PO CAPS
50.0000 mg | ORAL_CAPSULE | Freq: Once | ORAL | Status: AC
  Administered 2012-12-03: 50 mg via ORAL
  Filled 2012-12-03: qty 2

## 2012-12-03 NOTE — ED Provider Notes (Signed)
CSN: [id number]     Arrival date & time 12/03/12  1446 History  This chart was scribed for non-physician practitioner Mora Bellman, PA-C working with Junius Argyle, MD by Valera Castle, ED scribe. This patient was seen in room WTR8/WTR8 and the patient's care was started at 3:46 PM.    Chief Complaint  Patient presents with  . Anxiety  . Hypertension  . Medication Refill    The history is provided by the patient. No language interpreter was used.   HPI Comments: [name] is a 27 y.o. female with a h/o anxiety, depression, epileptic seizures, Schizoaffective disorder, and a brain tumor, who presents to the Emergency Department complaining of hypertension, gradual anxiety, and a medication refill. She reports her anxiety symptoms have been increasing, and that she has been having panic attacks. She denies having seen her doctor since last August and reports not having a refill for her medications. She reports still having her Abilify injection. She reports not being able to see her doctor again until December, which is why she presents to the ER today. She reports not having taken her Lisinopril-HTCZ, due to feeling lightheaded. She states last taking the Lisinopril-HCTZ 2 days ago.   She reports associated waxing and waning, moderate headaches, onset 2 days. She states the last time she had an MRI was last month. She reports that these headaches feel like her h/o headaches.  She denies fever, numbness, weakness, tingling, chest pain, SOB, vision changes, and any other associated symptoms. She reports smoking .25 PPD, but denies EtOH use. She has an allergy to Ketorolac Tromethamine, Lortab, and Tramadol.  Past Medical History  Diagnosis Date  . 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 Brain tumor   . Schizoaffective disorder   . Anxiety   . Depression   . Seizures   . Kidney stones   . Kidney stone   . Cancer 1991    ovarian   Past Surgical History   Procedure Laterality Date  . Abdominal hysterectomy      partial  . Kidney stone removal    . Appendectomy     Family History  Problem Relation Age of Onset  . Diabetes type II Other    History  Substance Use Topics  . Smoking status: Current Every Day Smoker -- 0.25 packs/day    Types: Cigarettes  . Smokeless tobacco: Never Used  . Alcohol Use: No   OB History   Grav Para Term Preterm Abortions TAB SAB Ect Mult Living                 Review of Systems  Constitutional: Negative for fever.  Eyes: Negative for visual disturbance.  Neurological: Negative for weakness and numbness.  Psychiatric/Behavioral:       Anxiety.  All other systems reviewed and are negative.    Allergies  Ketorolac tromethamine; Lortab; and Tramadol  Home Medications   Current Outpatient Rx  Name  Route  Sig  Dispense  Refill  . ALPRAZolam (XANAX) 1 MG tablet   Oral   Take 1 mg by mouth 3 (three) times daily as needed for anxiety.         . ARIPiprazole (ABILIFY) 9.75 MG/1.3ML injection   Intramuscular   Inject 15 mg into the muscle every 30 (thirty) days.         Marland Kitchen ibuprofen (ADVIL,MOTRIN) 800 MG tablet   Oral   Take 800 mg by mouth every 8 (eight) hours  as needed for pain.          Triage Vitals: BP 154/94  Pulse 101  Temp(Src) 98.8 F (37.1 C) (Oral)  Resp 20  SpO2 94%  Physical Exam  Nursing note and vitals reviewed. Constitutional: She is oriented to person, place, and time. She appears well-developed and well-nourished. No distress.  Obese.  HENT:  Head: Normocephalic and atraumatic.  Right Ear: External ear normal.  Left Ear: External ear normal.  Nose: Nose normal.  Mouth/Throat: Oropharynx is clear and moist.  Eyes: Conjunctivae and EOM are normal. Pupils are equal, round, and reactive to light.  Neck: Normal range of motion.  Cardiovascular: Normal rate, regular rhythm and normal heart sounds.   Distal pulses intact.   Pulmonary/Chest: Effort normal and  breath sounds normal. No stridor. No respiratory distress. She has no wheezes. She has no rales.  Abdominal: Soft. She exhibits no distension.  Musculoskeletal: Normal range of motion.  Mild nonpitting edema to BLE.   Neurological: She is alert and oriented to person, place, and time. She has normal strength.  5/5 strength to all extremities.  Skin: Skin is warm and dry. She is not diaphoretic. No erythema.  Psychiatric: She has a normal mood and affect. Her behavior is normal.    ED Course  Procedures (including critical care time)  DIAGNOSTIC STUDIES: Oxygen Saturation is 94% on room air, adequate by my interpretation.    COORDINATION OF CARE: 3:57 PM-Discussed treatment plan with pt at bedside and pt agreed to plan.     Labs Review Labs Reviewed - No data to display Imaging Review No results found.  MDM   1. Medication refill    Sent for a medication refill for Xanax. She comes with a letter from her program director stating that this is a referral to the emergency department. I called the number on the "referral" to discuss that the emergency room is not an appropriate place to get medications refilled. I did give the patient a three-day supply of Xanax. She must find a different way to get her medications refilled. Patient also reports having a headache. Her last MRI was last month. This is the same headache that she always gets. She reports that her neurologist gives her Percocet. She was denied Percocet and emergency department, explaining that we do not give narcotics for headaches. She was given Tylenol, Reglan, Benadryl. Return instructions given. Vital signs stable for discharge. Patient / Family / Caregiver informed of clinical course, understand medical decision-making process, and agree with plan.    I personally performed the services described in this documentation, which was scribed in my presence. The recorded information has been reviewed and is  accurate.    Mora Bellman, PA-C 12/03/12 [phone]

## 2012-12-03 NOTE — ED Notes (Addendum)
Pt c/o anxiety, hypertension and intermittent headache x 1 week.  Pain score 10/10.  Sts "I ran out of my anxiety and blood pressure medication last Tuesday and I can't see my doctor until November/December."

## 2012-12-04 NOTE — ED Provider Notes (Signed)
Medical screening examination/treatment/procedure(s) were performed by non-physician practitioner and as supervising physician I was immediately available for consultation/collaboration.   Junius Argyle, MD 12/04/12 [phone]

## 2012-12-16 ENCOUNTER — Emergency Department (HOSPITAL_COMMUNITY)
Admission: EM | Admit: 2012-12-16 | Discharge: 2012-12-16 | Disposition: A | Discharge status: Home / Self Care | Payer: Medicaid Other | Attending: Emergency Medicine | Admitting: Emergency Medicine

## 2012-12-16 ENCOUNTER — Ambulatory Visit: Payer: Medicaid Other | Admitting: Physical Therapy

## 2012-12-16 ENCOUNTER — Encounter (HOSPITAL_COMMUNITY): Payer: Self-pay | Admitting: Emergency Medicine

## 2012-12-16 DIAGNOSIS — F17200 Nicotine dependence, unspecified, uncomplicated: Secondary | ICD-10-CM | POA: Insufficient documentation

## 2012-12-16 DIAGNOSIS — M545 Low back pain, unspecified: Secondary | ICD-10-CM

## 2012-12-16 DIAGNOSIS — Z79899 Other long term (current) drug therapy: Secondary | ICD-10-CM | POA: Insufficient documentation

## 2012-12-16 DIAGNOSIS — G8929 Other chronic pain: Secondary | ICD-10-CM | POA: Insufficient documentation

## 2012-12-16 DIAGNOSIS — G40309 Generalized idiopathic epilepsy and epileptic syndromes, not intractable, without status epilepticus: Secondary | ICD-10-CM | POA: Insufficient documentation

## 2012-12-16 DIAGNOSIS — F411 Generalized anxiety disorder: Secondary | ICD-10-CM | POA: Insufficient documentation

## 2012-12-16 DIAGNOSIS — R350 Frequency of micturition: Secondary | ICD-10-CM | POA: Insufficient documentation

## 2012-12-16 DIAGNOSIS — R209 Unspecified disturbances of skin sensation: Secondary | ICD-10-CM | POA: Insufficient documentation

## 2012-12-16 DIAGNOSIS — F259 Schizoaffective disorder, unspecified: Secondary | ICD-10-CM | POA: Insufficient documentation

## 2012-12-16 DIAGNOSIS — F3289 Other specified depressive episodes: Secondary | ICD-10-CM | POA: Insufficient documentation

## 2012-12-16 DIAGNOSIS — F329 Major depressive disorder, single episode, unspecified: Secondary | ICD-10-CM | POA: Insufficient documentation

## 2012-12-16 MED ORDER — OXYCODONE-ACETAMINOPHEN 5-325 MG PO TABS
1.0000 | ORAL_TABLET | Freq: Once | ORAL | Status: AC
  Administered 2012-12-16: 1 via ORAL

## 2012-12-16 MED ORDER — PREDNISONE 50 MG PO TABS: 50.0000 mg | ORAL_TABLET | Freq: Every day | ORAL | Status: DC

## 2012-12-16 MED ORDER — OXYCODONE-ACETAMINOPHEN 5-325 MG PO TABS: 1.0000 | ORAL_TABLET | Freq: Four times a day (QID) | ORAL | Status: DC | PRN

## 2012-12-16 MED ORDER — CYCLOBENZAPRINE HCL 10 MG PO TABS: 10.0000 mg | ORAL_TABLET | Freq: Three times a day (TID) | ORAL | Status: DC | PRN

## 2012-12-16 NOTE — Progress Notes (Signed)
   CARE MANAGEMENT ED NOTE 12/16/2012  Patient:  ANIELA, CANIGLIA   Account Number:  [id number]  Date Initiated:  12/16/2012  Documentation initiated by:  Edd Arbour  Subjective/Objective Assessment:   27 yr old female [place] access pt without a pcp listed in EPIC seen at [facility] and told she has scoliosis and arthritis, states "back gave out on her" earlier today at work & now in pain,     Subjective/Objective Assessment Detail:     Action/Plan:   Spoke with pt who states she is seen by Sedonia Small, NP at [facility] at [place] and had previously been seen by [facility] providers EPIC updated   Action/Plan Detail:   Anticipated DC Date:  12/16/2012     Status Recommendation to Physician:   Result of Recommendation:    Other ED Services  Consult Working Plan    DC Planning Services  Other  PCP issues  Outpatient Services - Pt will follow up    Choice offered to / List presented to:            Status of service:  Completed, signed off  ED Comments:   ED Comments Detail:
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
## 2012-12-16 NOTE — ED Notes (Addendum)
Pt seen at [facility] and told she has scoliosis and arthritis, states "back gave out on her" earlier today at work and now in pain, states she fell down some steps after back gave out

## 2012-12-16 NOTE — ED Provider Notes (Signed)
Medical screening examination/treatment/procedure(s) were performed by non-physician practitioner and as supervising physician I was immediately available for consultation/collaboration.  Kesi Perrow L Shavona Gunderman, MD 12/16/12 1607 

## 2012-12-17 ENCOUNTER — Ambulatory Visit: Payer: Medicaid Other | Admitting: Physical Therapy

## 2012-12-23 ENCOUNTER — Other Ambulatory Visit: Payer: Self-pay | Admitting: Orthopedic Surgery

## 2012-12-23 DIAGNOSIS — M545 Low back pain: Secondary | ICD-10-CM

## 2013-01-02 ENCOUNTER — Ambulatory Visit
Admission: RE | Admit: 2013-01-02 | Discharge: 2013-01-02 | Disposition: A | Discharge status: Home / Self Care | Payer: Medicaid Other | Source: Ambulatory Visit | Attending: Orthopedic Surgery | Admitting: Orthopedic Surgery

## 2013-01-02 DIAGNOSIS — M545 Low back pain: Secondary | ICD-10-CM

## 2013-03-12 ENCOUNTER — Ambulatory Visit: Payer: Medicaid Other | Attending: Anesthesiology | Admitting: Physical Therapy

## 2013-03-12 DIAGNOSIS — M545 Low back pain, unspecified: Secondary | ICD-10-CM | POA: Insufficient documentation

## 2013-03-12 DIAGNOSIS — R293 Abnormal posture: Secondary | ICD-10-CM | POA: Insufficient documentation

## 2013-03-12 DIAGNOSIS — M6281 Muscle weakness (generalized): Secondary | ICD-10-CM | POA: Insufficient documentation

## 2013-03-12 DIAGNOSIS — IMO0001 Reserved for inherently not codable concepts without codable children: Secondary | ICD-10-CM | POA: Insufficient documentation

## 2013-05-13 ENCOUNTER — Emergency Department (HOSPITAL_COMMUNITY)
Admission: EM | Admit: 2013-05-13 | Discharge: 2013-05-13 | Discharge status: Against Medical Advice | Payer: Medicaid Other | Attending: Emergency Medicine | Admitting: Emergency Medicine

## 2013-05-13 ENCOUNTER — Encounter (HOSPITAL_COMMUNITY): Payer: Self-pay | Admitting: Emergency Medicine

## 2013-05-13 DIAGNOSIS — F17200 Nicotine dependence, unspecified, uncomplicated: Secondary | ICD-10-CM | POA: Insufficient documentation

## 2013-05-13 DIAGNOSIS — R109 Unspecified abdominal pain: Secondary | ICD-10-CM

## 2013-05-13 DIAGNOSIS — Z79899 Other long term (current) drug therapy: Secondary | ICD-10-CM | POA: Insufficient documentation

## 2013-05-13 DIAGNOSIS — F329 Major depressive disorder, single episode, unspecified: Secondary | ICD-10-CM | POA: Insufficient documentation

## 2013-05-13 DIAGNOSIS — N390 Urinary tract infection, site not specified: Secondary | ICD-10-CM | POA: Insufficient documentation

## 2013-05-13 DIAGNOSIS — Z8669 Personal history of other diseases of the nervous system and sense organs: Secondary | ICD-10-CM | POA: Insufficient documentation

## 2013-05-13 DIAGNOSIS — Z90710 Acquired absence of both cervix and uterus: Secondary | ICD-10-CM | POA: Insufficient documentation

## 2013-05-13 DIAGNOSIS — F411 Generalized anxiety disorder: Secondary | ICD-10-CM | POA: Insufficient documentation

## 2013-05-13 DIAGNOSIS — Z86011 Personal history of benign neoplasm of the brain: Secondary | ICD-10-CM | POA: Insufficient documentation

## 2013-05-13 DIAGNOSIS — Z8543 Personal history of malignant neoplasm of ovary: Secondary | ICD-10-CM | POA: Insufficient documentation

## 2013-05-13 DIAGNOSIS — Z9089 Acquired absence of other organs: Secondary | ICD-10-CM | POA: Insufficient documentation

## 2013-05-13 DIAGNOSIS — F3289 Other specified depressive episodes: Secondary | ICD-10-CM | POA: Insufficient documentation

## 2013-05-13 DIAGNOSIS — Z87442 Personal history of urinary calculi: Secondary | ICD-10-CM | POA: Insufficient documentation

## 2013-05-13 DIAGNOSIS — F259 Schizoaffective disorder, unspecified: Secondary | ICD-10-CM | POA: Insufficient documentation

## 2013-05-13 LAB — URINALYSIS, ROUTINE W REFLEX MICROSCOPIC
BILIRUBIN URINE: NEGATIVE
Glucose, UA: NEGATIVE mg/dL
Ketones, ur: NEGATIVE mg/dL
Nitrite: NEGATIVE
PROTEIN: 30 mg/dL — AB
Specific Gravity, Urine: 1.03 (ref 1.005–1.030)
UROBILINOGEN UA: 1 mg/dL (ref 0.0–1.0)
pH: 6 (ref 5.0–8.0)

## 2013-05-13 LAB — URINE MICROSCOPIC-ADD ON

## 2013-05-13 MED ORDER — ONDANSETRON 8 MG PO TBDP
8.0000 mg | ORAL_TABLET | Freq: Once | ORAL | Status: AC
  Administered 2013-05-13: 8 mg via ORAL
  Filled 2013-05-13: qty 1

## 2013-05-13 MED ORDER — SODIUM CHLORIDE 0.9 % IV BOLUS (SEPSIS): 1000.0000 mL | Freq: Once | INTRAVENOUS | Status: DC

## 2013-05-13 MED ORDER — DIPHENOXYLATE-ATROPINE 2.5-0.025 MG PO TABS
1.0000 | ORAL_TABLET | Freq: Once | ORAL | Status: AC
  Administered 2013-05-13: 1 via ORAL
  Filled 2013-05-13: qty 1

## 2013-05-13 MED ORDER — OXYCODONE-ACETAMINOPHEN 5-325 MG PO TABS
1.0000 | ORAL_TABLET | Freq: Once | ORAL | Status: AC
  Administered 2013-05-13: 1 via ORAL
  Filled 2013-05-13: qty 1

## 2013-05-13 MED ORDER — MORPHINE SULFATE 4 MG/ML IJ SOLN
4.0000 mg | Freq: Once | INTRAMUSCULAR | Status: DC
Start: 2013-05-13 — End: 2013-05-13
  Filled 2013-05-13: qty 1

## 2013-05-13 MED ORDER — CEPHALEXIN 250 MG PO CAPS: 250.0000 mg | ORAL_CAPSULE | Freq: Four times a day (QID) | ORAL | Status: DC

## 2013-05-13 MED ORDER — ONDANSETRON HCL 4 MG/2ML IJ SOLN
4.0000 mg | Freq: Once | INTRAMUSCULAR | Status: DC
  Filled 2013-05-13: qty 2

## 2013-05-13 NOTE — ED Provider Notes (Signed)
CSN: [id number]     Arrival date & time 05/13/13  1345 History   First MD Initiated Contact with Patient 05/13/13 1503     Chief Complaint  Patient presents with  . Abdominal Pain     (Consider location/radiation/quality/duration/timing/severity/associated sxs/prior Treatment) HPI Kym L Harbach is a 28 y.o. female who presents to emergency department with complaint pain for 2 weeks, nausea, vomiting, diarrhea. He patient states that she has been in [place] week ago, was told that she may have pancreatitis. She was discharged with Percocet and Phenergan. She states Percocet controlled her pain however when ran out of pain medications and her pain returned. Pain is mainly in the upper abdomen, radiates to bilateral groin. She denies any urinary or vaginal symptoms. She denies any flank pain. She's not taking any current medications for this. She also states ran out of Xanax, states she goes to [facility] for psychiatric problems but her doctor is out of town until May. Patient denies any fever or chills. She denies any chest pain or soreness of breath. She denies any blood in her emesis or stool. She denies any suicidal homicidal ideations.   Past Medical History  Diagnosis Date  . 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 Brain tumor   . Schizoaffective disorder   . Anxiety   . Depression   . Seizures   . Kidney stones   . Kidney stone   . Cancer 1991    ovarian   Past Surgical History  Procedure Laterality Date  . Abdominal hysterectomy      partial  . Kidney stone removal    . Appendectomy     Family History  Problem Relation Age of Onset  . Diabetes type II Other    History  Substance Use Topics  . Smoking status: Current Every Day Smoker -- 0.25 packs/day    Types: Cigarettes  . Smokeless tobacco: Never Used  . Alcohol Use: No   OB History   Grav Para Term Preterm Abortions TAB SAB Ect Mult Living                 Review of Systems  Constitutional:  Negative for fever and chills.  Respiratory: Negative for cough, chest tightness and shortness of breath.   Cardiovascular: Negative for chest pain, palpitations and leg swelling.  Gastrointestinal: Positive for nausea, vomiting, abdominal pain and diarrhea.  Genitourinary: Negative for dysuria and flank pain.  Musculoskeletal: Negative for arthralgias, myalgias, neck pain and neck stiffness.  Skin: Negative for rash.  Neurological: Negative for dizziness, weakness and headaches.  Psychiatric/Behavioral: The patient is nervous/anxious.   All other systems reviewed and are negative.      Allergies  Ketorolac tromethamine; Lortab; and Tramadol  Home Medications   Current Outpatient Rx  Name  Route  Sig  Dispense  Refill  . ALPRAZolam (XANAX) 1 MG tablet   Oral   Take 1 tablet (1 mg total) by mouth 3 (three) times daily as needed for sleep or anxiety.   10 tablet   0   . ARIPiprazole (ABILIFY) 9.75 MG/1.3ML injection   Intramuscular   Inject 15 mg into the muscle every 30 (thirty) days.          BP 151/101  Pulse 92  Temp(Src) 98.1 F (36.7 C) (Oral)  Resp 16  SpO2 100% Physical Exam  Nursing note and vitals reviewed. Constitutional: She appears well-developed and well-nourished. No distress.  HENT:  Head: Normocephalic.  Eyes: Conjunctivae are  normal.  Neck: Neck supple.  Cardiovascular: Normal rate, regular rhythm and normal heart sounds.   Pulmonary/Chest: Effort normal and breath sounds normal. No respiratory distress. She has no wheezes. She has no rales.  Abdominal: Soft. Bowel sounds are normal. She exhibits no distension. There is tenderness. There is no rebound and no guarding.  No cva tenderness. Tender in RUQ and LUQ  Musculoskeletal: She exhibits no edema.  Neurological: She is alert.  Skin: Skin is warm and dry.  Psychiatric: She has a normal mood and affect. Her behavior is normal.    ED Course  Procedures (including critical care time) Labs  Review Labs Reviewed  URINALYSIS, ROUTINE W REFLEX MICROSCOPIC - Abnormal; Notable for the following:    Color, Urine AMBER (*)    APPearance TURBID (*)    Hgb urine dipstick TRACE (*)    Protein, ur 30 (*)    Leukocytes, UA LARGE (*)    All other components within normal limits  URINE MICROSCOPIC-ADD ON - Abnormal; Notable for the following:    Squamous Epithelial / LPF MANY (*)    Bacteria, UA MANY (*)    Crystals CA OXALATE CRYSTALS (*)    All other components within normal limits  URINE CULTURE  CBC WITH DIFFERENTIAL  COMPREHENSIVE METABOLIC PANEL  LIPASE, BLOOD   Imaging Review No results found.   EKG Interpretation None      MDM   Final diagnoses:  Abdominal pain  UTI (lower urinary tract infection)   Pt with abdominal pain, nausea, vomiting, diarrhea. She is in no distress at present. Multiple evals for the same. Multiple CTs and [place] abd in the last year all negative. Abdomen is soft. No acute abdomen. She is afebrile. UA obtained showing UTI, however sample is contaminated. Will treat. Will get labs.   4:53 PM Pt with poor veins. Difficulty obtaining blood work or getting IV by Engineer, civil (consulting). I went in to speak with pt. She does not want any tests done any longer. She states she has children at home, and needs to get back to them. She requesting pain medications and refill on her xanax. i have explained to her, xanax will need to be refilled by her psychiatrist. As far as pain medications, i have given her percocet for pain here in ED, zofran, and lomotil for diarrhea. No prescriptions since leaving AMA. Instructed to follow up closely.     Renold Genta, PA-C 05/13/13 2341

## 2013-05-13 NOTE — Discharge Instructions (Signed)
You have chosen today to leave against medical advise. Take antibiotics as prescribed until all gone for urinary tract infection. Follow up closely for recheck. Return if symptoms worsening.   Urinary Tract Infection Urinary tract infections (UTIs) can develop anywhere along your urinary tract. Your urinary tract is your body's drainage system for removing wastes and extra water. Your urinary tract includes two kidneys, two ureters, a bladder, and a urethra. Your kidneys are a pair of bean-shaped organs. Each kidney is about the size of your fist. They are located below your ribs, one on each side of your spine. CAUSES Infections are caused by microbes, which are microscopic organisms, including fungi, viruses, and bacteria. These organisms are so small that they can only be seen through a microscope. Bacteria are the microbes that most commonly cause UTIs. SYMPTOMS  Symptoms of UTIs may vary by age and gender of the patient and by the location of the infection. Symptoms in young women typically include a frequent and intense urge to urinate and a painful, burning feeling in the bladder or urethra during urination. Older women and men are more likely to be tired, shaky, and weak and have muscle aches and abdominal pain. A fever may mean the infection is in your kidneys. Other symptoms of a kidney infection include pain in your back or sides below the ribs, nausea, and vomiting. DIAGNOSIS To diagnose a UTI, your caregiver will ask you about your symptoms. Your caregiver also will ask to provide a urine sample. The urine sample will be tested for bacteria and white blood cells. White blood cells are made by your body to help fight infection. TREATMENT  Typically, UTIs can be treated with medication. Because most UTIs are caused by a bacterial infection, they usually can be treated with the use of antibiotics. The choice of antibiotic and length of treatment depend on your symptoms and the type of bacteria  causing your infection. HOME CARE INSTRUCTIONS  If you were prescribed antibiotics, take them exactly as your caregiver instructs you. Finish the medication even if you feel better after you have only taken some of the medication.  Drink enough water and fluids to keep your urine clear or pale yellow.  Avoid caffeine, tea, and carbonated beverages. They tend to irritate your bladder.  Empty your bladder often. Avoid holding urine for long periods of time.  Empty your bladder before and after sexual intercourse.  After a bowel movement, women should cleanse from front to back. Use each tissue only once. SEEK MEDICAL CARE IF:   You have back pain.  You develop a fever.  Your symptoms do not begin to resolve within 3 days. SEEK IMMEDIATE MEDICAL CARE IF:   You have severe back pain or lower abdominal pain.  You develop chills.  You have nausea or vomiting.  You have continued burning or discomfort with urination. MAKE SURE YOU:   Understand these instructions.  Will watch your condition.  Will get help right away if you are not doing well or get worse. Document Released: 11/22/2004 Document Revised: 08/14/2011 Document Reviewed: 03/23/2011 [facility] Patient Information 2014 [place].

## 2013-05-13 NOTE — ED Notes (Signed)
Pt c/o abd pain radiating to groin.  States it is d/t her "paintitis".  Upon further questioning, pt meant pancreatitis.  This is not in her medical history.  Pt states other history of ovarian cancer and mental illness. Pain x 2 wks.  Denies vomiting but has been having "boo boos". When asked what this means, pt states "the squirts", meaning diarrhea.

## 2013-05-14 LAB — URINE CULTURE

## 2013-05-15 NOTE — ED Provider Notes (Signed)
Medical screening examination/treatment/procedure(s) were performed by non-physician practitioner and as supervising physician I was immediately available for consultation/collaboration.   EKG Interpretation None        Alfonzo Feller, DO 05/15/13 1430

## 2013-06-21 ENCOUNTER — Encounter (HOSPITAL_COMMUNITY): Payer: Self-pay | Admitting: Emergency Medicine

## 2013-06-21 ENCOUNTER — Emergency Department (HOSPITAL_COMMUNITY)
Admission: EM | Admit: 2013-06-21 | Discharge: 2013-06-21 | Disposition: A | Discharge status: Home / Self Care | Payer: Medicaid Other | Attending: Emergency Medicine | Admitting: Emergency Medicine

## 2013-06-21 DIAGNOSIS — F17200 Nicotine dependence, unspecified, uncomplicated: Secondary | ICD-10-CM | POA: Insufficient documentation

## 2013-06-21 DIAGNOSIS — Z792 Long term (current) use of antibiotics: Secondary | ICD-10-CM | POA: Insufficient documentation

## 2013-06-21 DIAGNOSIS — R059 Cough, unspecified: Secondary | ICD-10-CM

## 2013-06-21 DIAGNOSIS — F411 Generalized anxiety disorder: Secondary | ICD-10-CM | POA: Insufficient documentation

## 2013-06-21 DIAGNOSIS — Z86011 Personal history of benign neoplasm of the brain: Secondary | ICD-10-CM | POA: Insufficient documentation

## 2013-06-21 DIAGNOSIS — Z79899 Other long term (current) drug therapy: Secondary | ICD-10-CM | POA: Insufficient documentation

## 2013-06-21 DIAGNOSIS — H9209 Otalgia, unspecified ear: Secondary | ICD-10-CM | POA: Insufficient documentation

## 2013-06-21 DIAGNOSIS — J40 Bronchitis, not specified as acute or chronic: Secondary | ICD-10-CM | POA: Insufficient documentation

## 2013-06-21 DIAGNOSIS — Z8543 Personal history of malignant neoplasm of ovary: Secondary | ICD-10-CM | POA: Insufficient documentation

## 2013-06-21 DIAGNOSIS — F3289 Other specified depressive episodes: Secondary | ICD-10-CM | POA: Insufficient documentation

## 2013-06-21 DIAGNOSIS — F329 Major depressive disorder, single episode, unspecified: Secondary | ICD-10-CM | POA: Insufficient documentation

## 2013-06-21 DIAGNOSIS — F259 Schizoaffective disorder, unspecified: Secondary | ICD-10-CM | POA: Insufficient documentation

## 2013-06-21 DIAGNOSIS — Z87442 Personal history of urinary calculi: Secondary | ICD-10-CM | POA: Insufficient documentation

## 2013-06-21 DIAGNOSIS — R05 Cough: Secondary | ICD-10-CM

## 2013-06-21 MED ORDER — NAPROXEN 500 MG PO TABS: 500.0000 mg | ORAL_TABLET | Freq: Two times a day (BID) | ORAL | Status: DC

## 2013-06-21 MED ORDER — BENZONATATE 100 MG PO CAPS: 100.0000 mg | ORAL_CAPSULE | Freq: Three times a day (TID) | ORAL | Status: DC

## 2013-06-21 NOTE — Discharge Instructions (Signed)
Take the prescribed medication as directed.  Continue using albuterol nebs and inhalers for shortness of breath. Follow-up with [facility] regarding your xanax. May follow up with your primary care physician for re-check. Return to the ED for new or worsening symptoms.

## 2013-06-21 NOTE — ED Notes (Signed)
Pt states for 1 week she has had cough productive of yellow/green sputum, nasal congestion and chills.  Pt denies N/V/D.  Pt states her right chest hurts from the force of coughing.  Pt's lung sounds are clear - diminished right lower lobe.  Pt states she used her albuterol inhaler @ 30 minutes PTA.  Pt denies seasonal allergies.

## 2013-06-21 NOTE — ED Provider Notes (Signed)
CSN: [id number]     Arrival date & time 06/21/13  1236 History   This chart was scribed for non-physician practitioner Larene Pickett, PA-C, working with Dot Lanes, MD, by Neta Ehlers, ED Scribe. This patient was seen in room WTR5/WTR5 and the patient's care was started at 1:39 PM.  First MD Initiated Contact with Patient 06/21/13 1317     Chief Complaint  Patient presents with  . Nasal Congestion  . Cough    The history is provided by the patient. No language interpreter was used.   HPI Comments: [name] is a 28 y.o. female who presents to the Emergency Department complaining of a constant cough productive of yellow/green sputum and which has persisted for approximately 1 week. The pt reports the cough has been associated with nasal congestion, chills, and otalgia. She also reports SOB with exertion as well as post-tussive chest pain. She denies a fever, nausea, emesis, or diarrhea. Prior to arrival, the pt used a breathing treatment and inhaler with moderate relief though she reports she can still "feel it in my chest."  She denies sick contacts. She has a h/o similar symptoms annually with the seasonal changes and states she has a h/o bronchitis.  VS stable on arrival.  She had a chest x-ray 3 to 4 months ago.   Past Medical History  Diagnosis Date  . 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 Brain tumor   . Schizoaffective disorder   . Anxiety   . Depression   . Seizures   . Kidney stones   . Kidney stone   . Cancer 1991    ovarian   Past Surgical History  Procedure Laterality Date  . Abdominal hysterectomy      partial  . Kidney stone removal    . Appendectomy     Family History  Problem Relation Age of Onset  . Diabetes type II Other    History  Substance Use Topics  . Smoking status: Current Every Day Smoker -- 0.25 packs/day    Types: Cigarettes  . Smokeless tobacco: Never Used  . Alcohol Use: No   No OB history provided.  Review  of Systems  Constitutional: Positive for chills. Negative for fever.  HENT: Positive for congestion and ear pain.   Respiratory: Positive for cough and shortness of breath.   Gastrointestinal: Negative for nausea, vomiting and diarrhea.  All other systems reviewed and are negative.   Allergies  Ketorolac tromethamine; Lortab; and Tramadol  Home Medications   Prior to Admission medications   Medication Sig Start Date End Date Taking? Authorizing Provider  ALPRAZolam Duanne Moron) 1 MG tablet Take 1 tablet (1 mg total) by mouth 3 (three) times daily as needed for sleep or anxiety. 12/03/12   Elwyn Lade, PA-C  ARIPiprazole (ABILIFY) 9.75 MG/1.3ML injection Inject 15 mg into the muscle every 30 (thirty) days.    Historical Provider, MD  cephALEXin (KEFLEX) 250 MG capsule Take 1 capsule (250 mg total) by mouth 4 (four) times daily. 05/13/13   Tatyana A Kirichenko, PA-C   Triage Vitals: BP 161/97  Pulse 105  Temp(Src) 98.6 F (37 C) (Oral)  Resp 20  SpO2 98%  Physical Exam  Nursing note and vitals reviewed. Constitutional: She is oriented to person, place, and time. She appears well-developed and well-nourished. No distress.  HENT:  Head: Normocephalic and atraumatic.  Right Ear: Tympanic membrane and external ear normal.  Left Ear: Tympanic membrane and external ear  normal.  Nose: Nose normal.  Mouth/Throat: Uvula is midline, oropharynx is clear and moist and mucous membranes are normal. No oropharyngeal exudate, posterior oropharyngeal edema, posterior oropharyngeal erythema or tonsillar abscesses.  Eyes: Conjunctivae and EOM are normal. Pupils are equal, round, and reactive to light.  Neck: Normal range of motion.  Cardiovascular: Normal rate, regular rhythm and normal heart sounds.   Pulmonary/Chest: Effort normal and breath sounds normal. No accessory muscle usage. Not tachypneic. No respiratory distress. She has no wheezes. She has no rales.  Lungs CTAB; no signs of respiratory  distress; speaking in full complete sentences without difficulty  Abdominal: Soft. Bowel sounds are normal.  Musculoskeletal: Normal range of motion.  Neurological: She is alert and oriented to person, place, and time.  Skin: Skin is warm and dry. She is not diaphoretic.  Psychiatric: She has a normal mood and affect.    ED Course  Procedures (including critical care time)  DIAGNOSTIC STUDIES: Oxygen Saturation is 98% on room air, normal by my interpretation.    COORDINATION OF CARE:  1:44 PM- Discussed treatment plan with patient, and the patient agreed to the plan. She denies a chest x-ray. The plan includes Tessalon pearls    Labs Review Labs Reviewed - No data to display  Imaging Review No results found.   EKG Interpretation None      MDM   Final diagnoses:  Bronchitis  Cough   Offered pt CXR for further evaluation, she declined stating "i know it's bronchitis, i don't want to waste your time."   Lungs are clear bilaterally without wheezes or rhonchi to suggest CAP.  At this time I have low suspicion that patient's symptoms are due to ACS, PE, dissection, or other acute cardiac event. Pt will be given tessalon perles and anti-inflammatories. Constellation of symptoms likely viral in nature. She requests refill of her xanax, i have instructed her to contact her psychiatrist for this.  Discussed plan with patient, he/she acknowledged understanding and agreed with plan of care.  Return precautions given for new or worsening symptoms.  I personally performed the services described in this documentation, which was scribed in my presence. The recorded information has been reviewed and is accurate.   Larene Pickett, PA-C 06/21/13 1436

## 2013-06-23 NOTE — ED Provider Notes (Signed)
Medical screening examination/treatment/procedure(s) were performed by non-physician practitioner and as supervising physician I was immediately available for consultation/collaboration.    Fuad Forget L Natoya Viscomi, MD 06/23/13 1106 

## 2013-06-30 ENCOUNTER — Emergency Department (HOSPITAL_COMMUNITY): Payer: Medicaid Other

## 2013-06-30 ENCOUNTER — Encounter (HOSPITAL_COMMUNITY): Payer: Self-pay | Admitting: Emergency Medicine

## 2013-06-30 ENCOUNTER — Emergency Department (HOSPITAL_COMMUNITY)
Admission: EM | Admit: 2013-06-30 | Discharge: 2013-07-01 | Disposition: A | Discharge status: Home / Self Care | Payer: Medicaid Other | Attending: Emergency Medicine | Admitting: Emergency Medicine

## 2013-06-30 DIAGNOSIS — Z792 Long term (current) use of antibiotics: Secondary | ICD-10-CM | POA: Insufficient documentation

## 2013-06-30 DIAGNOSIS — Z90710 Acquired absence of both cervix and uterus: Secondary | ICD-10-CM | POA: Insufficient documentation

## 2013-06-30 DIAGNOSIS — Z3202 Encounter for pregnancy test, result negative: Secondary | ICD-10-CM | POA: Insufficient documentation

## 2013-06-30 DIAGNOSIS — Z87442 Personal history of urinary calculi: Secondary | ICD-10-CM | POA: Insufficient documentation

## 2013-06-30 DIAGNOSIS — Z8543 Personal history of malignant neoplasm of ovary: Secondary | ICD-10-CM | POA: Insufficient documentation

## 2013-06-30 DIAGNOSIS — F17200 Nicotine dependence, unspecified, uncomplicated: Secondary | ICD-10-CM | POA: Insufficient documentation

## 2013-06-30 DIAGNOSIS — Z791 Long term (current) use of non-steroidal anti-inflammatories (NSAID): Secondary | ICD-10-CM | POA: Insufficient documentation

## 2013-06-30 DIAGNOSIS — R5381 Other malaise: Secondary | ICD-10-CM | POA: Insufficient documentation

## 2013-06-30 DIAGNOSIS — R319 Hematuria, unspecified: Secondary | ICD-10-CM | POA: Insufficient documentation

## 2013-06-30 DIAGNOSIS — F259 Schizoaffective disorder, unspecified: Secondary | ICD-10-CM | POA: Insufficient documentation

## 2013-06-30 DIAGNOSIS — F329 Major depressive disorder, single episode, unspecified: Secondary | ICD-10-CM | POA: Insufficient documentation

## 2013-06-30 DIAGNOSIS — R5383 Other fatigue: Secondary | ICD-10-CM

## 2013-06-30 DIAGNOSIS — F411 Generalized anxiety disorder: Secondary | ICD-10-CM | POA: Insufficient documentation

## 2013-06-30 DIAGNOSIS — F3289 Other specified depressive episodes: Secondary | ICD-10-CM | POA: Insufficient documentation

## 2013-06-30 DIAGNOSIS — Z9889 Other specified postprocedural states: Secondary | ICD-10-CM | POA: Insufficient documentation

## 2013-06-30 DIAGNOSIS — R112 Nausea with vomiting, unspecified: Secondary | ICD-10-CM | POA: Insufficient documentation

## 2013-06-30 DIAGNOSIS — R531 Weakness: Secondary | ICD-10-CM

## 2013-06-30 DIAGNOSIS — Z8669 Personal history of other diseases of the nervous system and sense organs: Secondary | ICD-10-CM | POA: Insufficient documentation

## 2013-06-30 DIAGNOSIS — R109 Unspecified abdominal pain: Secondary | ICD-10-CM

## 2013-06-30 LAB — URINALYSIS, ROUTINE W REFLEX MICROSCOPIC
BILIRUBIN URINE: NEGATIVE
Glucose, UA: NEGATIVE mg/dL
Hgb urine dipstick: NEGATIVE
KETONES UR: NEGATIVE mg/dL
NITRITE: NEGATIVE
PROTEIN: NEGATIVE mg/dL
Specific Gravity, Urine: 1.029 (ref 1.005–1.030)
UROBILINOGEN UA: 1 mg/dL (ref 0.0–1.0)
pH: 6 (ref 5.0–8.0)

## 2013-06-30 LAB — POC URINE PREG, ED: Preg Test, Ur: NEGATIVE

## 2013-06-30 LAB — URINE MICROSCOPIC-ADD ON

## 2013-06-30 MED ORDER — MORPHINE SULFATE 4 MG/ML IJ SOLN
4.0000 mg | INTRAMUSCULAR | Status: DC | PRN
  Filled 2013-06-30: qty 1

## 2013-06-30 MED ORDER — ONDANSETRON HCL 4 MG/2ML IJ SOLN
4.0000 mg | Freq: Once | INTRAMUSCULAR | Status: AC
  Administered 2013-07-01: 4 mg via INTRAVENOUS
  Filled 2013-06-30: qty 2

## 2013-06-30 NOTE — ED Notes (Signed)
Pt is c/o lower back pain in the kidney areas, vomiting, lower extremity swelling, chills, and general body aches  Pt states the onset of sxs was on Thursday   Pt states today she has headache and weakness

## 2013-06-30 NOTE — ED Provider Notes (Signed)
CSN: [id number]     Arrival date & time 06/30/13  2132 History   First MD Initiated Contact with Patient 06/30/13 2300     Chief Complaint  Patient presents with  . Back Pain  . Emesis     (Consider location/radiation/quality/duration/timing/severity/associated sxs/prior Treatment) HPI [name] is a(n) 28 y.o. female who presents emergency Department with chief complaint of abdominal pain, nausea and vomiting. Patient has a history of schizoaffective disorder, seizures, and kidney stones. Patient states that she takes she is a kidney stone now. She states her pain began 6 days ago. Had one episode of hematuria. Complains of left flank pain, abdominal pain began today with nausea. Denies any other urinary symptoms. She complains of weakness. Denies fevers, chills, myalgias, arthralgias. Denies DOE, SOB, chest tightness or pressure, radiation to left arm, jaw or back, or diaphoresis. Denies dysuria,  suprapubic pain, frequency, urgency.  Denies headaches, light headedness, weakness, visual disturbances. Denies abdominal pain, nausea, vomiting, diarrhea or constipation.    Past Medical History  Diagnosis Date  . 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 Brain tumor   . Schizoaffective disorder   . Anxiety   . Depression   . Seizures   . Kidney stones   . Kidney stone   . Cancer 1991    ovarian   Past Surgical History  Procedure Laterality Date  . Abdominal hysterectomy      partial  . Kidney stone removal    . Appendectomy     Family History  Problem Relation Age of Onset  . Diabetes type II Other    History  Substance Use Topics  . Smoking status: Current Every Day Smoker -- 0.25 packs/day    Types: Cigarettes  . Smokeless tobacco: Never Used  . Alcohol Use: No   OB History   Grav Para Term Preterm Abortions TAB SAB Ect Mult Living                 Review of Systems  Ten systems reviewed and are negative for acute change, except as noted in the  HPI.    Allergies  Ketorolac tromethamine; Lortab; and Tramadol  Home Medications   Prior to Admission medications   Medication Sig Start Date End Date Taking? Authorizing Provider  ALPRAZolam Duanne Moron) 1 MG tablet Take 1 tablet (1 mg total) by mouth 3 (three) times daily as needed for sleep or anxiety. 12/03/12   Elwyn Lade, PA-C  ARIPiprazole (ABILIFY) 9.75 MG/1.3ML injection Inject 15 mg into the muscle every 30 (thirty) days.    Historical Provider, MD  benzonatate (TESSALON) 100 MG capsule Take 1 capsule (100 mg total) by mouth every 8 (eight) hours. 06/21/13   Larene Pickett, PA-C  cephALEXin (KEFLEX) 250 MG capsule Take 1 capsule (250 mg total) by mouth 4 (four) times daily. 05/13/13   Tatyana A Kirichenko, PA-C  naproxen (NAPROSYN) 500 MG tablet Take 1 tablet (500 mg total) by mouth 2 (two) times daily with a meal. 06/21/13   Larene Pickett, PA-C   BP 128/63  Pulse 81  Temp(Src) 98.1 F (36.7 C) (Oral)  Resp 16  SpO2 97% Physical Exam Physical Exam  Nursing note and vitals reviewed. Constitutional: She is oriented to person, place, and time. Morbidly obese No distress.  HENT:  Head: Normocephalic and atraumatic.  Eyes: Conjunctivae normal and EOM are normal. Pupils are equal, round, and reactive to light. No scleral icterus.  Neck: Normal range of motion.  Cardiovascular: Normal rate, regular rhythm and normal heart sounds.  Exam reveals no gallop and no friction rub.   No murmur heard. Pulmonary/Chest: Effort normal and breath sounds normal. No respiratory distress.  Abdominal: Soft. Bowel sounds are normal. She exhibits no distension and no mass. There is no tenderness. There is no guarding.  no CVA tenderness  Neurological: She is alert and oriented to person, place, and time.  Skin: Skin is warm and dry. She is not diaphoretic.    ED Course  Procedures (including critical care time) Labs Review Labs Reviewed  URINALYSIS, ROUTINE W REFLEX MICROSCOPIC - Abnormal;  Notable for the following:    APPearance CLOUDY (*)    Leukocytes, UA SMALL (*)    All other components within normal limits  CBC WITH DIFFERENTIAL - Abnormal; Notable for the following:    Hemoglobin 11.7 (*)    Lymphs Abs 4.1 (*)    All other components within normal limits  BASIC METABOLIC PANEL - Abnormal; Notable for the following:    Potassium 3.4 (*)    Glucose, Bld 116 (*)    GFR calc non Af Amer 83 (*)    All other components within normal limits  URINE MICROSCOPIC-ADD ON - Abnormal; Notable for the following:    Squamous Epithelial / LPF MANY (*)    Crystals CA OXALATE CRYSTALS (*)    All other components within normal limits  POC URINE PREG, ED    Imaging Review Ct Abdomen Pelvis Wo Contrast  06/30/2013   CLINICAL DATA:  Bilateral flank pain.  Evaluate for stone.  EXAM: CT ABDOMEN AND PELVIS WITHOUT CONTRAST  TECHNIQUE: Multidetector CT imaging of the abdomen and pelvis was performed following the standard protocol without IV contrast.  COMPARISON:  05/22/2012  FINDINGS: BODY WALL: Lower abdominal wall scarring status post pelvic surgery.  LOWER CHEST: Unremarkable.  ABDOMEN/PELVIS:  Liver: No focal abnormality.  Biliary: No evidence of biliary obstruction or stone.  Pancreas: Unremarkable.  Spleen: Unremarkable.  Adrenals: Unremarkable.  Kidneys and ureters: No hydronephrosis or stone.  Bladder: Unremarkable.  Reproductive: Surgical changes in the right adnexa region.  Bowel: No obstruction. No pericecal inflammation. Question appendectomy.  Retroperitoneum: No mass or adenopathy.  Peritoneum: No free fluid or gas.  Vascular: No acute abnormality.  OSSEOUS: No acute abnormalities.  IMPRESSION: No hydronephrosis, nephrolithiasis, or acute intra-abdominal abnormality.   Electronically Signed   By: Jorje Guild M.D.   On: 06/30/2013 23:49     EKG Interpretation None      MDM   Final diagnoses:  Abdominal pain  Weakness   Filed Vitals:   06/30/13 2221 07/01/13 0111   BP: 142/87 128/63  Pulse: 86 81  Temp: 98.1 F (36.7 C)   TempSrc: Oral   Resp: 20 16  SpO2: 97% 97%    Patient with out evidence of UTI/kidney stone. She has mild hypokalemia and elevated glucose. Mild anemia. No evidence of aki. CT without acute abnormality. Afebrile No evidence of infections.  Patient is nontoxic, nonseptic appearing, in no apparent distress.  Patient's pain and other symptoms adequately managed in emergency department.  Fluid bolus given.  Labs, imaging and vitals reviewed.  Patient does not meet the SIRS or Sepsis criteria.  On repeat exam patient does not have a surgical abdomin and there are nor peritoneal signs.  No indication of appendicitis, bowel obstruction, bowel perforation, cholecystitis, diverticulitis, PID or ectopic pregnancy.  Patient discharged home with symptomatic treatment and given strict instructions for follow-up with their  primary care physician.  I have also discussed reasons to return immediately to the ER.  Patient expresses understanding and agrees with plan.        Margarita Mail, PA-C 07/01/13 [id number]

## 2013-07-01 LAB — CBC WITH DIFFERENTIAL/PLATELET
BASOS PCT: 0 % (ref 0–1)
Basophils Absolute: 0 10*3/uL (ref 0.0–0.1)
EOS ABS: 0.1 10*3/uL (ref 0.0–0.7)
Eosinophils Relative: 1 % (ref 0–5)
HCT: 36.7 % (ref 36.0–46.0)
Hemoglobin: 11.7 g/dL — ABNORMAL LOW (ref 12.0–15.0)
Lymphocytes Relative: 40 % (ref 12–46)
Lymphs Abs: 4.1 10*3/uL — ABNORMAL HIGH (ref 0.7–4.0)
MCH: 30.2 pg (ref 26.0–34.0)
MCHC: 31.9 g/dL (ref 30.0–36.0)
MCV: 94.6 fL (ref 78.0–100.0)
Monocytes Absolute: 0.5 10*3/uL (ref 0.1–1.0)
Monocytes Relative: 5 % (ref 3–12)
NEUTROS PCT: 55 % (ref 43–77)
Neutro Abs: 5.6 10*3/uL (ref 1.7–7.7)
Platelets: 375 10*3/uL (ref 150–400)
RBC: 3.88 MIL/uL (ref 3.87–5.11)
RDW: 13.3 % (ref 11.5–15.5)
WBC: 10.3 10*3/uL (ref 4.0–10.5)

## 2013-07-01 LAB — BASIC METABOLIC PANEL
BUN: 18 mg/dL (ref 6–23)
CO2: 27 mEq/L (ref 19–32)
Calcium: 9.1 mg/dL (ref 8.4–10.5)
Chloride: 100 mEq/L (ref 96–112)
Creatinine, Ser: 0.93 mg/dL (ref 0.50–1.10)
GFR, EST NON AFRICAN AMERICAN: 83 mL/min — AB (ref >90)
Glucose, Bld: 116 mg/dL — ABNORMAL HIGH (ref 70–99)
Potassium: 3.4 mEq/L — ABNORMAL LOW (ref 3.7–5.3)
Sodium: 140 mEq/L (ref 137–147)

## 2013-07-01 MED ORDER — HYDROMORPHONE HCL PF 1 MG/ML IJ SOLN
1.0000 mg | Freq: Once | INTRAMUSCULAR | Status: AC
  Administered 2013-07-01: 1 mg via INTRAVENOUS
  Filled 2013-07-01: qty 1

## 2013-07-01 MED ORDER — HYDROMORPHONE HCL PF 1 MG/ML IJ SOLN
0.5000 mg | Freq: Once | INTRAMUSCULAR | Status: AC
  Administered 2013-07-01: 0.5 mg via INTRAVENOUS
  Filled 2013-07-01: qty 1

## 2013-07-01 MED ORDER — HYDROCODONE-ACETAMINOPHEN 5-325 MG PO TABS
1.0000 | ORAL_TABLET | ORAL | Status: DC | PRN
Start: 2013-07-01 — End: 2013-09-02

## 2013-07-01 MED ORDER — HYDROMORPHONE HCL PF 1 MG/ML IJ SOLN: 0.5000 mg | Freq: Once | INTRAMUSCULAR | Status: DC

## 2013-07-01 NOTE — Discharge Instructions (Signed)
Abdominal Pain, Adult Many things can cause abdominal pain. Usually, abdominal pain is not caused by a disease and will improve without treatment. It can often be observed and treated at home. Your health care provider will do a physical exam and possibly order blood tests and X-rays to help determine the seriousness of your pain. However, in many cases, more time must pass before a clear cause of the pain can be found. Before that point, your health care provider may not know if you need more testing or further treatment. HOME CARE INSTRUCTIONS  Monitor your abdominal pain for any changes. The following actions may help to alleviate any discomfort you are experiencing:  Only take over-the-counter or prescription medicines as directed by your health care provider.  Do not take laxatives unless directed to do so by your health care provider.  Try a clear liquid diet (broth, tea, or water) as directed by your health care provider. Slowly move to a bland diet as tolerated. SEEK MEDICAL CARE IF:  You have unexplained abdominal pain.  You have abdominal pain associated with nausea or diarrhea.  You have pain when you urinate or have a bowel movement.  You experience abdominal pain that wakes you in the night.  You have abdominal pain that is worsened or improved by eating food.  You have abdominal pain that is worsened with eating fatty foods. SEEK IMMEDIATE MEDICAL CARE IF:   Your pain does not go away within 2 hours.  You have a fever.  You keep throwing up (vomiting).  Your pain is felt only in portions of the abdomen, such as the right side or the left lower portion of the abdomen.  You pass bloody or black tarry stools. MAKE SURE YOU:  Understand these instructions.   Will watch your condition.   Will get help right away if you are not doing well or get worse.  Document Released: 11/22/2004 Document Revised: 12/03/2012 Document Reviewed: 10/22/2012 [facility] Patient  Information 2014 [place].  Weakness Weakness is a lack of strength. It may be felt all over the body (generalized) or in one specific part of the body (focal). Some causes of weakness can be serious. You may need further medical evaluation, especially if you are elderly or you have a history of immunosuppression (such as chemotherapy or HIV), kidney disease, heart disease, or diabetes. CAUSES  Weakness can be caused by many different things, including:  Infection.  Physical exhaustion.  Internal bleeding or other blood loss that results in a lack of red blood cells (anemia).  Dehydration. This cause is more common in elderly people.  Side effects or electrolyte abnormalities from medicines, such as pain medicines or sedatives.  Emotional distress, anxiety, or depression.  Circulation problems, especially severe peripheral arterial disease.  Heart disease, such as rapid atrial fibrillation, bradycardia, or heart failure.  Nervous system disorders, such as Guillain-Barr syndrome, multiple sclerosis, or stroke. DIAGNOSIS  To find the cause of your weakness, your caregiver will take your history and perform a physical exam. Lab tests or X-rays may also be ordered, if needed. TREATMENT  Treatment of weakness depends on the cause of your symptoms and can vary greatly. HOME CARE INSTRUCTIONS   Rest as needed.  Eat a well-balanced diet.  Try to get some exercise every day.  Only take over-the-counter or prescription medicines as directed by your caregiver. SEEK MEDICAL CARE IF:   Your weakness seems to be getting worse or spreads to other parts of your body.  You develop new aches or pains. SEEK IMMEDIATE MEDICAL CARE IF:   You cannot perform your normal daily activities, such as getting dressed and feeding yourself.  You cannot walk up and down stairs, or you feel exhausted when you do so.  You have shortness of breath or chest pain.  You have difficulty moving  parts of your body.  You have weakness in only one area of the body or on only one side of the body.  You have a fever.  You have trouble speaking or swallowing.  You cannot control your bladder or bowel movements.  You have black or bloody vomit or stools. MAKE SURE YOU:  Understand these instructions.  Will watch your condition.  Will get help right away if you are not doing well or get worse. Document Released: 02/12/2005 Document Revised: 08/14/2011 Document Reviewed: 04/13/2011 [facility] Patient Information 2014 [place].

## 2013-07-01 NOTE — ED Provider Notes (Signed)
Medical screening examination/treatment/procedure(s) were performed by non-physician practitioner and as supervising physician I was immediately available for consultation/collaboration.   EKG Interpretation None       Kalman Drape, MD 07/01/13 1818

## 2013-08-27 ENCOUNTER — Institutional Professional Consult (permissible substitution): Payer: Medicaid Other | Admitting: Internal Medicine

## 2013-09-02 ENCOUNTER — Telehealth: Payer: Self-pay | Admitting: Internal Medicine

## 2013-09-02 ENCOUNTER — Ambulatory Visit (INDEPENDENT_AMBULATORY_CARE_PROVIDER_SITE_OTHER): Payer: Medicaid Other | Admitting: Internal Medicine

## 2013-09-02 ENCOUNTER — Encounter: Payer: Self-pay | Admitting: Internal Medicine

## 2013-09-02 VITALS — BP 132/90 | HR 84 | Temp 98.5°F | Ht 65.0 in | Wt 316.6 lb

## 2013-09-02 DIAGNOSIS — I10 Essential (primary) hypertension: Secondary | ICD-10-CM

## 2013-09-02 DIAGNOSIS — R059 Cough, unspecified: Secondary | ICD-10-CM

## 2013-09-02 DIAGNOSIS — R05 Cough: Secondary | ICD-10-CM

## 2013-09-02 DIAGNOSIS — R058 Other specified cough: Secondary | ICD-10-CM

## 2013-09-02 MED ORDER — OXYCODONE-ACETAMINOPHEN 5-325 MG PO TABS: ORAL_TABLET | ORAL | Status: DC

## 2013-09-02 MED ORDER — OLMESARTAN MEDOXOMIL-HCTZ 40-25 MG PO TABS
ORAL_TABLET | ORAL | Status: DC
Start: 2013-09-02 — End: 2013-10-09

## 2013-09-02 NOTE — Telephone Encounter (Signed)
[place] but needs to understand if the other one is filled this would be against the law and whoever fills it might be subject to arrest as it is a controlled drug ? Needs to call police to report it missing? (up to her)  Reorder roxicet same rx as office

## 2013-09-02 NOTE — Progress Notes (Signed)
   Subjective:    Patient ID: [name], female    DOB: [birth year]  MRN: [id number]  HPI  75 yobf variably described as former smoker( she reports quit summer 2014)  with  dx of asthma age 28  requiring daily rx and freq ER eval - does not remember ever living s daily treatments since age 24  - never saw specialist - worse than usual since  summer of 2015 so referred to pulmonary clinic by Dr Tommi Rumps 09/02/2013   09/02/2013 1st [facility] office visit/ [name] on ACEi Chief Complaint  Patient presents with  . Pulmonary Consult    Referred per Dr. Kevan Ny. Pt c/o cough x 4 wks. Cough is prod with minimal yellow sputum. She also c/o pain under breasts when she coughs. She also c/o DOE with walking minimal distances.   cough is more than usual, now hurts to cough x 4 weeks Doe x across the room   No obvious other patterns in day to day or daytime variabilty or assoc   cp or chest tightness, subjective wheeze overt sinus or hb symptoms. No unusual exp hx or h/o childhood pna/ asthma or knowledge of premature birth.  Sleeping ok without nocturnal  or early am exacerbation  of respiratory  c/o's or need for noct saba. Also denies any obvious fluctuation of symptoms with weather or environmental changes or other aggravating or alleviating factors except as outlined above   Current Medications, Allergies, Complete Past Medical History, Past Surgical History, Family History, and Social History were reviewed in [employer] record.             Review of Systems  Constitutional: Negative for fever, chills and unexpected weight change.  HENT: Negative for congestion, dental problem, ear pain, nosebleeds, postnasal drip, rhinorrhea, sinus pressure, sneezing, sore throat, trouble swallowing and voice change.   Eyes: Negative for visual disturbance.  Respiratory: Positive for cough and shortness of breath. Negative for choking.   Cardiovascular: Negative  for chest pain and leg swelling.  Gastrointestinal: Negative for vomiting, abdominal pain and diarrhea.  Genitourinary: Negative for difficulty urinating.  Musculoskeletal: Positive for arthralgias.  Skin: Negative for rash.  Neurological: Negative for tremors, syncope and headaches.  Hematological: Does not bruise/bleed easily.       Objective:   Physical Exam  amb obese bf with very unusual affect and harsh barking cough  Wt Readings from Last 3 Encounters:  09/02/13 316 lb 9.6 oz (143.609 kg)  05/07/11 293 lb (132.904 kg)  05/07/11 293 lb (132.904 kg)     HEENT: nl dentition, turbinates, and orophanx. Nl external ear canals without cough reflex   NECK :  without JVD/Nodes/TM/ nl carotid upstrokes bilaterally   LUNGS: no acc muscle use, clear to A and P bilaterally without cough on insp or exp maneuvers   CV:  RRR  no s3 or murmur or increase in P2, no edema   ABD:  soft and nontender with nl excursion in the supine position. No bruits or organomegaly, bowel sounds nl  MS:  warm without deformities, calf tenderness, cyanosis or clubbing  SKIN: warm and dry without lesions    NEURO:  alert, approp, no deficits          Assessment & Plan:

## 2013-09-02 NOTE — Patient Instructions (Signed)
Stop lisinopril  Start benicar 40/25 one half daily   For pain or cough take percocet up to every 4 hours as needed   Think of your respiratory medications as multiple steps you can take to control your symptoms and avoid having to go to the ER   Plan A= automatic is your maintenance daily no matter what meds: symbicort 80 Take 2 puffs first thing in am and then another 2 puffs about 12 hours later.  Plan B = backup  only use after you've used your maintenance (Plan A) medication, and only if you can't catch your breath: albuterol up to 2 puffs every 4h Plan C only use after you've used plan A and B and still can't catch your breath: neb albuterol up to every 4 hours  Plan D(for Doctor):  If you've used A thru C and not doing a lot better or still needing C more than a once a day,  D = call the doctor for evaluation asap Plan E (for ER):  If still not able to catch your breath, even after using your nebulizer up to every 4 hours, go to ER   Please schedule a follow up office visit in 4 weeks, sooner if needed to see Tammy NP

## 2013-09-02 NOTE — Telephone Encounter (Signed)
Called and spoke with pt and she stated that she lost the rx for the pain meds that was given to her today at the [facility].  She stated that she has been searching for the rx but was not able to find it.    Pt stated that she is in a lot of pain and is wanting to know if MW can write another rx for her?  She stated that she has been coughing since she left the office today and has used her steps A and B and is still not any better.  Any further recs from MW.  Please advise. Thanks  Allergies  Allergen Reactions  . Ketorolac Tromethamine Hives  . Lortab [Hydrocodone-Acetaminophen] Hives    Tolerates acetaminophen  . Tramadol Hives    Current Outpatient Prescriptions on File Prior to Visit  Medication Sig Dispense Refill  . albuterol (ACCUNEB) 1.25 MG/3ML nebulizer solution Take 1 ampule by nebulization 3 (three) times daily.      Marland Kitchen albuterol (PROAIR HFA) 108 (90 BASE) MCG/ACT inhaler Inhale 2 puffs into the lungs every 6 (six) hours as needed for wheezing or shortness of breath.      . ALPRAZolam (XANAX) 1 MG tablet Take 1 tablet (1 mg total) by mouth 3 (three) times daily as needed for sleep or anxiety.  10 tablet  0  . budesonide-formoterol (SYMBICORT) 80-4.5 MCG/ACT inhaler Inhale 2 puffs into the lungs 2 (two) times daily.      Marland Kitchen lisinopril-hydrochlorothiazide (PRINZIDE,ZESTORETIC) 20-12.5 MG per tablet Take 1 tablet by mouth daily.      Marland Kitchen olmesartan-hydrochlorothiazide (BENICAR HCT) 40-25 MG per tablet One half daily      . oxyCODONE-acetaminophen (ROXICET) 5-325 MG per tablet One every 4 h as needed for pain or cough  40 tablet  0   No current facility-administered medications on file prior to visit.

## 2013-09-02 NOTE — Telephone Encounter (Signed)
Called and spoke with pt and she is aware of MW recs.  She stated that her uncle is the magistrate and she will call him and let him know.  Pt is aware that she will come by the office and pick up the new rx tomorrow.  Nothing further is needed.

## 2013-09-03 DIAGNOSIS — I10 Essential (primary) hypertension: Secondary | ICD-10-CM | POA: Insufficient documentation

## 2013-09-03 DIAGNOSIS — R058 Other specified cough: Secondary | ICD-10-CM | POA: Insufficient documentation

## 2013-09-03 DIAGNOSIS — R05 Cough: Secondary | ICD-10-CM | POA: Insufficient documentation

## 2013-09-03 NOTE — Assessment & Plan Note (Addendum)
The most common causes of chronic cough in immunocompetent adults include the following: upper airway cough syndrome (UACS), previously referred to as postnasal drip syndrome (PNDS), which is caused by variety of rhinosinus conditions; (2) asthma; (3) GERD; (4) chronic bronchitis from cigarette smoking or other inhaled environmental irritants; (5) nonasthmatic eosinophilic bronchitis; and (6) bronchiectasis.   These conditions, singly or in combination, have accounted for up to 94% of the causes of chronic cough in prospective studies.   Other conditions have constituted no >6% of the causes in prospective studies These have included bronchogenic carcinoma, chronic interstitial pneumonia, sarcoidosis, left ventricular failure, ACEI-induced cough, and aspiration from a condition associated with pharyngeal dysfunction.    Chronic cough is often simultaneously caused by more than one condition. A single cause has been found from 38 to 82% of the time, multiple causes from 18 to 62%. Multiply caused cough has been the result of three diseases up to 42% of the time.       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   These are the same pts (now being labeled as having "irritable larynx syndrome" by some cough centers) who not infrequently have a history of having failed to tolerate ace inhibitors,  dry powder inhalers or biphosphonates or report having atypical reflux symptoms that don't respond to standard doses of PPI , and are easily confused as  having aecopd or asthma flares by even experienced allergists/ pulmonologists.   The first step is stop ACEi,    and eliminate cyclical coughing then regroup in 4 weeks to see what component of this asthma related .  See instructions for specific recommendations which were reviewed directly with the patient who was given a copy with highlighter outlining the key components.

## 2013-09-03 NOTE — Assessment & Plan Note (Addendum)
ACE inhibitors are problematic in  pts with airway complaints because  even experienced pulmonologists can't always distinguish ace effects from copd/asthma.  By themselves they don't actually cause a problem, much like oxygen can't by itself start a fire, but they certainly serve as a powerful catalyst or enhancer for any "fire"  or inflammatory process in the upper airway, be it caused by an ET  tube or more commonly reflux (especially in the obese or pts with known GERD or who are on biphoshonates).    In the era of ARB near equivalency until we have a better handle on the reversibility of the airway problem, it just makes sense to avoid ACEI  entirely in the short run and then decide later, having established a level of airway control using a reasonable limited regimen, whether to add back ace but even then being very careful to observe the pt for worsening airway control and number of meds used/ needed to control symptoms.    Try  benicar 40/25 one half daily and d/c lisinopril  See instructions for specific recommendations which were reviewed directly with the patient who was given a copy with highlighter outlining the key components.

## 2013-09-14 ENCOUNTER — Institutional Professional Consult (permissible substitution): Payer: Medicaid Other | Admitting: Internal Medicine

## 2013-09-21 ENCOUNTER — Institutional Professional Consult (permissible substitution): Payer: Medicaid Other | Admitting: Pulmonary Disease

## 2013-09-28 ENCOUNTER — Telehealth: Payer: Self-pay | Admitting: Internal Medicine

## 2013-09-28 NOTE — Telephone Encounter (Signed)
Called spoke with pt. She moved her scheduled appt up sooner with MW to tomorrow at 8:45. Nothing further needed

## 2013-09-29 ENCOUNTER — Ambulatory Visit (INDEPENDENT_AMBULATORY_CARE_PROVIDER_SITE_OTHER): Payer: Medicaid Other | Admitting: Internal Medicine

## 2013-09-29 ENCOUNTER — Encounter: Payer: Self-pay | Admitting: Internal Medicine

## 2013-09-29 ENCOUNTER — Ambulatory Visit (INDEPENDENT_AMBULATORY_CARE_PROVIDER_SITE_OTHER)
Admission: RE | Admit: 2013-09-29 | Discharge: 2013-09-29 | Disposition: A | Discharge status: Home / Self Care | Payer: Medicaid Other | Source: Ambulatory Visit | Attending: Internal Medicine | Admitting: Internal Medicine

## 2013-09-29 VITALS — BP 144/90 | HR 100 | Temp 99.0°F | Ht 65.0 in | Wt 308.8 lb

## 2013-09-29 DIAGNOSIS — R059 Cough, unspecified: Secondary | ICD-10-CM

## 2013-09-29 DIAGNOSIS — R05 Cough: Secondary | ICD-10-CM

## 2013-09-29 DIAGNOSIS — R058 Other specified cough: Secondary | ICD-10-CM

## 2013-09-29 DIAGNOSIS — I10 Essential (primary) hypertension: Secondary | ICD-10-CM

## 2013-09-29 MED ORDER — PANTOPRAZOLE SODIUM 40 MG PO TBEC: 40.0000 mg | DELAYED_RELEASE_TABLET | Freq: Every day | ORAL | Status: DC

## 2013-09-29 MED ORDER — OXYCODONE-ACETAMINOPHEN 5-325 MG PO TABS: ORAL_TABLET | ORAL | Status: DC

## 2013-09-29 MED ORDER — FAMOTIDINE 20 MG PO TABS: ORAL_TABLET | ORAL | Status: DC

## 2013-09-29 MED ORDER — VALSARTAN-HYDROCHLOROTHIAZIDE 160-25 MG PO TABS
1.0000 | ORAL_TABLET | Freq: Every day | ORAL | Status: DC
Start: 2013-09-29 — End: 2013-10-09

## 2013-09-29 NOTE — Progress Notes (Signed)
Subjective:    Patient ID: [name], female    DOB: Mar 14, 1985  MRN: [id number]   Brief patient profile:  27 yobf variably described as former smoker( she reports quit summer 2014)  with  dx of asthma age 28  requiring daily rx and freq ER eval - does not remember ever living s daily treatments since age 89  - never saw specialist - worse than usual since  summer of 2015 so referred to pulmonary clinic by Dr Tommi Rumps 09/02/2013   09/02/2013 1st [facility] office visit/ [name] on ACEi Chief Complaint  Patient presents with  . Pulmonary Consult    Referred per Dr. Kevan Ny. Pt c/o cough x 4 wks. Cough is prod with minimal yellow sputum. She also c/o pain under breasts when she coughs. She also c/o DOE with walking minimal distances.   cough is more than usual, now hurts to cough x 4 weeks Doe x across the room  rec For pain or cough take percocet up to every 4 hours as needed  Think of your respiratory medications as multiple steps you can take to control your symptoms and avoid having to go to the ER Plan A= automatic is your maintenance daily no matter what meds: symbicort 80 Take 2 puffs first thing in am and then another 2 puffs about 12 hours later.  Plan B = backup  only use after you've used your maintenance (Plan A) medication, and only if you can't catch your breath: albuterol up to 2 puffs every 4h Plan C only use after you've used plan A and B and still can't catch your breath: neb albuterol up to every 4 hours  Plan D(for Doctor):  If you've used A thru C and not doing a lot better or still needing C more than a once a day,  D = call the doctor for evaluation asap Plan E (for ER):  If still not able to catch your breath, even after using your nebulizer up to every 4 hours, go to ER     09/29/2013 f/u ov/Yandiel Bergum re: refractory cough ? Any asthma? Chief Complaint  Patient presents with  . Follow-up    Pt c/o increased cough and SOB for the past wk. Coughing  until the point of vomiting. She still c/o pain underneath breasts- esp when she coughs. She is using rescue inhaler 3-4 times per day and has used neb x 3 since last visit.    did not follow a single instruction as written, lost her rx for percocet and "called all the pharmacies to let them know" - affect continues to be unusual.  No mucus with cough unless vomits. Sob only with exertion  No obvious day to day or daytime variabilty or assoc   or cp or chest tightness, subjective wheeze or overt sinus   symptoms. No unusual exp hx or h/o childhood pna/ asthma or knowledge of premature birth.   Also denies any obvious fluctuation of symptoms with weather or environmental changes or other aggravating or alleviating factors except as outlined above   Current Medications, Allergies, Complete Past Medical History, Past Surgical History, Family History, and Social History were reviewed in [employer] record.  ROS  The following are not active complaints unless bolded sore throat, dysphagia, dental problems, itching, sneezing,  nasal congestion or excess/ purulent secretions, ear ache,   fever, chills, sweats, unintended wt loss, pleuritic or exertional cp, hemoptysis,  orthopnea pnd or leg swelling, presyncope,  palpitations, heartburn, abdominal pain, anorexia, nausea, vomiting, diarrhea  or change in bowel or urinary habits, change in stools or urine, dysuria,hematuria,  rash, arthralgias, visual complaints, headache, numbness weakness or ataxia or problems with walking or coordination,  change in mood/affect or memory.                        Objective:   Physical Exam  amb obese bf with very unusual affect and no longer  harsh barking cough  09/29/2013          309 Wt Readings from Last 3 Encounters:  09/02/13 316 lb 9.6 oz (143.609 kg)  05/07/11 293 lb (132.904 kg)  05/07/11 293 lb (132.904 kg)     HEENT: nl dentition, turbinates, and orophanx. Nl external ear  canals without cough reflex   NECK :  without JVD/Nodes/TM/ nl carotid upstrokes bilaterally   LUNGS: no acc muscle use, clear to A and P bilaterally without cough on insp or exp maneuvers   CV:  RRR  no s3 or murmur or increase in P2, no edema   ABD:  soft and nontender with nl excursion in the supine position. No bruits or organomegaly, bowel sounds nl  MS:  warm without deformities, calf tenderness, cyanosis or clubbing  SKIN: warm and dry without lesions         CXR  09/29/2013 :  Lungs are clear. Heart size and pulmonary vascularity are within normal limits. No adenopathy. No bone lesions     Assessment & Plan:

## 2013-09-29 NOTE — Assessment & Plan Note (Signed)
Adequate control on present rx, reviewed > no change in rx needed  > not a good candidate to ever challenge again with ACEi  Try generic diovan for [facility] reimbursement

## 2013-09-29 NOTE — Patient Instructions (Addendum)
Continue  benicar 40/25 one half daily until you run out of it, then fill the prescription for diovan/valsartan  For pain or cough take percocet up to 2 every 4 hours as needed   Pantoprazole (protonix) 40 mg   Take 30-60 min before first meal of the day and Pepcid 20 mg one bedtime until return to office - this is the best way to tell whether stomach acid is contributing to your problem.    GERD (REFLUX)  is an extremely common cause of respiratory symptoms, many times with no significant heartburn at all.    It can be treated with medication, but also with lifestyle changes including avoidance of late meals, excessive alcohol, smoking cessation, and avoid fatty foods, chocolate, peppermint, colas, red wine, and acidic juices such as orange juice.  NO MINT OR MENTHOL PRODUCTS SO NO COUGH DROPS  USE SUGARLESS CANDY INSTEAD (jolley ranchers or Stover's)  NO OIL BASED VITAMINS - use powdered substitutes.  Prednisone 10 mg take  4 each am x 2 days,   2 each am x 2 days,  1 each am x 2 days and stop     Think of your respiratory medications as multiple steps you can take to control your symptoms and avoid having to go to the ER   Plan A= automatic is your maintenance daily no matter what meds: symbicort 80 Take 2 puffs first thing in am and then another 2 puffs about 12 hours later.  Plan B = backup  only use after you've used your maintenance (Plan A) medication, and only if you can't catch your breath: albuterol up to 2 puffs every 4h and keep it with you  Plan C only use after you've used plan A and B and still can't catch your breath: neb albuterol up to every 4 hours  Plan D(for Doctor):  If you've used A thru C and not doing a lot better or still needing C more than a once a day,  D = call the doctor for evaluation asap Plan E (for ER):  If still not able to catch your breath, even after using your nebulizer up to every 4 hours, go to ER   Please remember to go to the x-ray department  downstairs for your tests - we will call you with the results when they are available.    Please schedule a follow up office visit in 2 weeks, sooner if needed to see Tammy NP with all your medications in hand

## 2013-09-29 NOTE — Assessment & Plan Note (Addendum)
Almost certainly this is  Classic Upper airway cough syndrome, so named because it's frequently impossible to sort out how much is  CR/sinusitis with freq throat clearing (which can be related to primary GERD)   vs  causing  secondary (" extra esophageal")  GERD from wide swings in gastric pressure that occur with throat clearing, often  promoting self use of mint and menthol lozenges that reduce the lower esophageal sphincter tone and exacerbate the problem further in a cyclical fashion.   These are the same pts (now being labeled as having "irritable larynx syndrome" by some cough centers) who not infrequently have a history of having failed to tolerate ace inhibitors,  dry powder inhalers or biphosphonates or report having atypical reflux symptoms that don't respond to standard doses of PPI , and are easily confused as having aecopd or asthma flares by even experienced allergists/ pulmonologists.   rx max gerd/ eliminate cyclical cough and then regroup with all meds in hand in 2 weeks using a trust but verify approach

## 2013-09-30 ENCOUNTER — Ambulatory Visit: Payer: Medicaid Other | Admitting: Adult Health

## 2013-09-30 NOTE — Progress Notes (Signed)
Quick Note:  Spoke with pt and notified of results per Dr. Wert. Pt verbalized understanding and denied any questions.  ______ 

## 2013-10-07 ENCOUNTER — Ambulatory Visit (INDEPENDENT_AMBULATORY_CARE_PROVIDER_SITE_OTHER): Payer: Medicaid Other | Admitting: Pulmonary Disease

## 2013-10-07 ENCOUNTER — Telehealth: Payer: Self-pay | Admitting: Internal Medicine

## 2013-10-07 ENCOUNTER — Encounter: Payer: Self-pay | Admitting: Pulmonary Disease

## 2013-10-07 VITALS — BP 130/86 | HR 105 | Temp 98.7°F | Ht 65.0 in | Wt 313.4 lb

## 2013-10-07 DIAGNOSIS — G4733 Obstructive sleep apnea (adult) (pediatric): Secondary | ICD-10-CM | POA: Insufficient documentation

## 2013-10-07 NOTE — Assessment & Plan Note (Signed)
The patient's history is classic for clinically significant obstructive sleep apnea. I have had a long discussion with her about sleep-disordered breathing, including its impact to quality of life and cardiovascular health. I think she needs to have a sleep study for diagnosis, and the patient is agreeable.

## 2013-10-07 NOTE — Telephone Encounter (Signed)
Last visit with MW 09-29-13, Next OV with TP on 10-13-13. Pt came to see [facility] today for sleep consult. She is c/o still having a lot of cough, pain in her chest, and now she is hoarse. I reviewed all of Dr. Morrison Old recs from last visit on 09-29-13. Pt states she is following all directions. She states that she has already taken most of the pain medication, states she only has 6 left. She states she is still having a lot of heartburn as well, especially when she is in bed.  She states she still coughs so much she vomits. Please advise. Cadiz Bing, CMA Allergies  Allergen Reactions  . Ketorolac Tromethamine Hives  . Lortab [Hydrocodone-Acetaminophen] Hives    Tolerates acetaminophen  . Tramadol Hives

## 2013-10-07 NOTE — Patient Instructions (Signed)
Will schedule for a sleep study, and arrange followup once the results are available.  Work on weight loss.   

## 2013-10-07 NOTE — Progress Notes (Signed)
Subjective:    Patient ID: [name], female    DOB: 19-Jun-1985, 28 y.o.   MRN: [id number]  HPI The patient is a 28 year old female who I've been asked to see for possible obstructive sleep apnea. She has been noted to have loud snoring, as well as witnessed apneas. She has frequent awakenings at night, and feels unrested in the mornings upon arising. She has severe daytime sleepiness, to the point that she will fall asleep anytime she sits still and is not being stimulated. She also has severe sleepiness in the evenings trying to watch television or movies. She tells me that she does not drive currently. The patient's weight has been up and down over the last few years, and her Epworth score today is 17.  Sleep Questionnaire What time do you typically go to bed?( Between what hours) 10 pm 10 pm at 1533 on 10/07/13 by Lilli Few, CMA How long does it take you to fall asleep? 20 minutes 20 minutes at 1533 on 10/07/13 by Lilli Few, CMA How many times during the night do you wake up? 5 5 at 1533 on 10/07/13 by Lilli Few, CMA What time do you get out of bed to start your day? 1130 1130 at 1533 on 10/07/13 by Lilli Few, CMA Do you drive or operate heavy machinery in your occupation? No No at 1533 on 10/07/13 by Lilli Few, CMA How much has your weight changed (up or down) over the past two years? (In pounds) 6 lb (2.722 kg) 6 lb (2.722 kg) at 1533 on 10/07/13 by Lilli Few, CMA Have you ever had a sleep study before? No No at 1533 on 10/07/13 by Lilli Few, CMA Do you currently use CPAP? No No at 1533 on 10/07/13 by Lilli Few, CMA Do you wear oxygen at any time? No No at 1533 on 10/07/13 by Lilli Few, CMA   Review of Systems  Constitutional: Negative for fever and unexpected weight change.  HENT: Negative for congestion, dental problem, ear pain, nosebleeds, postnasal drip, rhinorrhea,  sinus pressure, sneezing, sore throat and trouble swallowing.   Eyes: Negative for redness and itching.  Respiratory: Positive for cough and shortness of breath. Negative for chest tightness and wheezing.   Cardiovascular: Negative for palpitations and leg swelling.  Gastrointestinal: Positive for abdominal pain. Negative for nausea and vomiting.  Genitourinary: Negative for dysuria.  Musculoskeletal: Negative for joint swelling.  Skin: Negative for rash.  Neurological: Positive for headaches.  Hematological: Does not bruise/bleed easily.  Psychiatric/Behavioral: Negative for dysphoric mood. The patient is not nervous/anxious.        Objective:   Physical Exam Constitutional:  Morbidly obese female, no acute distress  HENT:  Nares patent without discharge  Oropharynx without exudate, palate and uvula moderately elongated and thickened.  +tonsillar enlargement.   Eyes:  Perrla, eomi, no scleral icterus  Neck:  No JVD, no TMG  Cardiovascular:  Normal rate, regular rhythm, no rubs or gallops.  No murmurs        Intact distal pulses  Pulmonary :  Normal breath sounds, no stridor or respiratory distress   No rales, rhonchi, or wheezing.  Prominent hoarseness  Abdominal:  Soft, nondistended, bowel sounds present.  No tenderness noted.   Musculoskeletal:  1+ lower extremity edema noted.  Lymph Nodes:  No cervical lymphadenopathy noted  Skin:  No cyanosis noted  Neurologic:  Alert, appropriate, moves all 4 extremities without obvious deficit.  Assessment & Plan:

## 2013-10-07 NOTE — Telephone Encounter (Signed)
Ok to work in before weekend with all active meds in hand including otcs and from all doctors

## 2013-10-07 NOTE — Telephone Encounter (Signed)
Called spoke with pt. appt scheduled for Friday. Pt aware to bring ALL her medications with her. Nothing further needed

## 2013-10-09 ENCOUNTER — Encounter: Payer: Self-pay | Admitting: Internal Medicine

## 2013-10-09 ENCOUNTER — Ambulatory Visit (INDEPENDENT_AMBULATORY_CARE_PROVIDER_SITE_OTHER): Payer: Medicaid Other | Admitting: Internal Medicine

## 2013-10-09 VITALS — BP 126/84 | HR 90 | Temp 98.0°F | Ht 65.0 in | Wt 311.2 lb

## 2013-10-09 DIAGNOSIS — R059 Cough, unspecified: Secondary | ICD-10-CM

## 2013-10-09 DIAGNOSIS — I10 Essential (primary) hypertension: Secondary | ICD-10-CM

## 2013-10-09 DIAGNOSIS — R058 Other specified cough: Secondary | ICD-10-CM

## 2013-10-09 DIAGNOSIS — R05 Cough: Secondary | ICD-10-CM

## 2013-10-09 MED ORDER — OXYCODONE-ACETAMINOPHEN 10-325 MG PO TABS: ORAL_TABLET | ORAL | Status: DC

## 2013-10-09 MED ORDER — METHYLPREDNISOLONE ACETATE 80 MG/ML IJ SUSP
120.0000 mg | Freq: Once | INTRAMUSCULAR | Status: AC
  Administered 2013-10-09: 120 mg via INTRAMUSCULAR

## 2013-10-09 MED ORDER — NEBIVOLOL HCL 10 MG PO TABS: 10.0000 mg | ORAL_TABLET | Freq: Every day | ORAL | Status: DC

## 2013-10-09 NOTE — Progress Notes (Signed)
Subjective:    Patient ID: [name], female    DOB: [birth year]  MRN: [id number]   Brief patient profile:  27 yobf variably described as former smoker( she reports quit summer 2014)  with  dx of asthma age 28  requiring daily rx and freq ER eval - does not remember ever living s daily treatments since age 57  - never saw specialist - worse than usual since  summer of 2015 so referred to pulmonary clinic by Dr [name] 09/02/2013    Brief patient profile:  09/02/2013 1st [facility] office visit/ [name] on ACEi Chief Complaint  Patient presents with  . Pulmonary Consult    Referred per Dr. Kevan Brown. Pt c/o cough x 4 wks. Cough is prod with minimal yellow sputum. She also c/o pain under breasts when she coughs. She also c/o DOE with walking minimal distances.   cough is more than usual, now hurts to cough x 4 weeks Doe x across the room  rec For pain or cough take percocet up to every 4 hours as needed  Think of your respiratory medications as multiple steps you can take to control your symptoms and avoid having to go to the ER Plan A= automatic is your maintenance daily no matter what meds: symbicort 80 Take 2 puffs first thing in am and then another 2 puffs about 12 hours later.  Plan B = backup  only use after you've used your maintenance (Plan A) medication, and only if you can't catch your breath: albuterol up to 2 puffs every 4h Plan C only use after you've used plan A and B and still can't catch your breath: neb albuterol up to every 4 hours  Plan D(for Doctor):  If you've used A thru C and not doing a lot better or still needing C more than a once a day,  D = call the doctor for evaluation asap Plan E (for ER):  If still not able to catch your breath, even after using your nebulizer up to every 4 hours, go to ER     09/29/2013 f/u ov/[name] re: refractory cough ? Any asthma? Chief Complaint  Patient presents with  . Follow-up    Pt c/o increased cough and SOB  for the past wk. Coughing until the point of vomiting. She still c/o pain underneath breasts- esp when she coughs. She is using rescue inhaler 3-4 times per day and has used neb x 3 since last visit.    did not follow a single instruction as written, lost her rx for percocet and "called all the pharmacies to let them know" - affect continues to be unusual.  No mucus with cough unless vomits. Sob only with exertion rec Continue  benicar 40/25 one half daily until you run out of it, then fill the prescription for diovan/valsartan For pain or cough take percocet up to 2 every 4 hours as needed  Pantoprazole (protonix) 40 mg   Take 30-60 min before first meal of the day and Pepcid 20 mg one bedtime     GERD   Diet  Prednisone 10 mg take  4 each am x 2 days,   2 each am x 2 days,  1 each am x 2 days and stop   Think of your respiratory medications as multiple steps you can take to control your symptoms and avoid having to go to the ER Plan A= automatic is your maintenance daily no matter what meds: symbicort 80  Take 2 puffs first thing in am and then another 2 puffs about 12 hours later.  Plan B = backup  only use after you've used your maintenance (Plan A) medication, and only if you can't catch your breath: albuterol up to 2 puffs every 4h and keep it with you  Plan C only use after you've used plan A and B and still can't catch your breath: neb albuterol up to every 4 hours  Plan D(for Doctor):  If you've used A thru C and not doing a lot better or still needing C more than a once a day,  D = call the doctor for evaluation asap Plan E (for ER):  If still not able to catch your breath, even after using your nebulizer up to every 4 hours, go to ER       10/09/2013  Acute  ov/[name] re: refractory cough with predominantly  Chief Complaint  Patient presents with  . Acute Visit    Pt c/o increased cough x 1 wk- prod with yellow to brown sputum to the point of vomiting. She has laryngitis.   brought all  of her bottles- they  are all empty "I took the last one of each this am" - did not bring neb solution - says percocet x 2 knocks her out so can't take it during the day but does not cough when she takes it.  Are you on singulair A yes Q where's the bottle A I just ran out it's being refilled    No obvious day to day or daytime variabilty or assoc   or cp or chest tightness, subjective wheeze or overt sinus   symptoms. No unusual exp hx or h/o childhood pna/ asthma or knowledge of premature birth.   Also denies any obvious fluctuation of symptoms with weather or environmental changes or other aggravating or alleviating factors except as outlined above   Current Medications, Allergies, Complete Past Medical History, Past Surgical History, Family History, and Social History were reviewed in [employer] record.  ROS  The following are not active complaints unless bolded sore throat, dysphagia, dental problems, itching, sneezing,  nasal congestion or excess/ purulent secretions, ear ache,   fever, chills, sweats, unintended wt loss, pleuritic or exertional cp, hemoptysis,  orthopnea pnd or leg swelling, presyncope, palpitations, heartburn, abdominal pain, anorexia, nausea, vomiting, diarrhea  or change in bowel or urinary habits, change in stools or urine, dysuria,hematuria,  rash, arthralgias, visual complaints, headache, numbness weakness or ataxia or problems with walking or coordination,  change in mood/affect or memory.                        Objective:   Physical Exam  amb obese bf with very unusual affect and gagging/harsh cough with extreme hoarseness   09/29/2013          309 > 10/09/2013   311 Wt Readings from Last 3 Encounters:  09/02/13 316 lb 9.6 oz (143.609 kg)  05/07/11 293 lb (132.904 kg)  05/07/11 293 lb (132.904 kg)     HEENT: nl dentition, turbinates, and orophanx. Nl external ear canals without cough reflex   NECK :  without JVD/Nodes/TM/ nl  carotid upstrokes bilaterally   LUNGS: no acc muscle use, clear to A and P bilaterally without cough on insp or exp maneuvers   CV:  RRR  no s3 or murmur or increase in P2, no edema   ABD:  soft and nontender  with nl excursion in the supine position. No bruits or organomegaly, bowel sounds nl  MS:  warm without deformities, calf tenderness, cyanosis or clubbing  SKIN: warm and dry without lesions         CXR  09/29/2013 :  Lungs are clear. Heart size and pulmonary vascularity are within normal limits. No adenopathy. No bone lesions     Assessment & Plan:

## 2013-10-09 NOTE — Patient Instructions (Addendum)
Stop prednisone and symbicort and all inhalers   bystolic 10 mg daily is you new blood pressure pill   Only use your albuterol neb as a rescue medication to be used if you can't catch your breath by resting or doing a relaxed purse lip breathing pattern.  - The less you use it, the better it will work when you need it. - Ok to use up to every 4 hours  For cough take percocet 10  1-2 every 4 hours as needed  Pantoprazole (protonix) 40 mg   Take 30-60 min before first meal of the day and Pepcid 20 mg one bedtime until return to office - this is the best way to tell whether stomach acid is contributing to your problem.    GERD (REFLUX)  is an extremely common cause of respiratory symptoms, many times with no significant heartburn at all.    It can be treated with medication, but also with lifestyle changes including avoidance of late meals, excessive alcohol, smoking cessation, and avoid fatty foods, chocolate, peppermint, colas, red wine, and acidic juices such as orange juice.  NO MINT OR MENTHOL PRODUCTS SO NO COUGH DROPS  USE SUGARLESS CANDY INSTEAD (jolley ranchers or Stover's)  NO OIL BASED VITAMINS - use powdered substitutes.    Return with all medications from all doctors when you see Tammy in 4 days and we will count your pills to make sure your are taking them correctly before considering changing any of them  Late add:  All of her med bottles are completely empty and were refilled today so when seen in 4 days should be able to verify 4 days absent from each one if she fills them today and immediately starts taking them as rec

## 2013-10-10 NOTE — Assessment & Plan Note (Addendum)
The standardized cough guidelines published in Chest by Lissa Morales in 2006 are still the best available and consist of a multiple step process (up to 12!) , not a single office visit,  and are intended  to address this problem logically,  with an alogrithm dependent on response to empiric treatment at  each progressive step  to determine a specific diagnosis with  minimal addtional testing needed. Therefore if adherence is an issue or can't be accurately verified,  it's very unlikely the standard evaluation and treatment will be successful here.    Furthermore, response to therapy (other than acute cough suppression, which should only be used short term with avoidance of narcotic containing cough syrups if possible), can be a gradual process for which the patient may perceive immediate benefit.  Unlike going to an eye doctor where the best perscription is almost always the first one and is immediately effective, this is almost never the case in the management of chronic cough syndromes. Therefore the patient needs to commit up front to consistently adhere to recommendations  for up to 6 weeks of therapy directed at the likely underlying problem(s) before the response can be reasonably evaluated.    We will need to start completely over with her and verify with each step that she's doing what we ask before declaring that stategy a failure.  To keep things simple, I have asked the patient to first separate medicines that are perceived as maintenance, that is to be taken daily "no matter what", from those medicines that are taken on only on an as-needed basis and I have given the patient examples of both, and then return to see our NP to generate a  detailed  medication calendar which should be followed until the next physician sees the patient and updates it.    For now rec retry cyclical cough regimen and d/c all inhalers except for neb and give a single depomedrol 120 IM to be sure she at least gets this  dose right.   I did inform her that if we cannot verify that she's doing what we ask, there is no point in returning to this clinic as we will not be able to help her

## 2013-10-10 NOTE — Assessment & Plan Note (Addendum)
Can't get arb on her plan > try bystolic 10 mg per day x one box of samples

## 2013-10-13 ENCOUNTER — Encounter: Payer: Medicaid Other | Admitting: Adult Health

## 2013-10-28 DIAGNOSIS — R109 Unspecified abdominal pain: Secondary | ICD-10-CM | POA: Insufficient documentation

## 2013-10-28 DIAGNOSIS — Z87442 Personal history of urinary calculi: Secondary | ICD-10-CM | POA: Diagnosis not present

## 2013-10-28 DIAGNOSIS — Z3202 Encounter for pregnancy test, result negative: Secondary | ICD-10-CM | POA: Insufficient documentation

## 2013-10-28 DIAGNOSIS — Z86011 Personal history of benign neoplasm of the brain: Secondary | ICD-10-CM | POA: Diagnosis not present

## 2013-10-28 DIAGNOSIS — E669 Obesity, unspecified: Secondary | ICD-10-CM | POA: Diagnosis not present

## 2013-10-28 DIAGNOSIS — Z8543 Personal history of malignant neoplasm of ovary: Secondary | ICD-10-CM | POA: Diagnosis not present

## 2013-10-28 DIAGNOSIS — Z87891 Personal history of nicotine dependence: Secondary | ICD-10-CM | POA: Insufficient documentation

## 2013-10-28 DIAGNOSIS — R112 Nausea with vomiting, unspecified: Secondary | ICD-10-CM | POA: Insufficient documentation

## 2013-10-28 DIAGNOSIS — Z79899 Other long term (current) drug therapy: Secondary | ICD-10-CM | POA: Diagnosis not present

## 2013-10-28 DIAGNOSIS — Z8659 Personal history of other mental and behavioral disorders: Secondary | ICD-10-CM | POA: Diagnosis not present

## 2013-10-28 DIAGNOSIS — Z9089 Acquired absence of other organs: Secondary | ICD-10-CM | POA: Insufficient documentation

## 2013-10-29 ENCOUNTER — Encounter (HOSPITAL_COMMUNITY): Payer: Self-pay | Admitting: Emergency Medicine

## 2013-10-29 ENCOUNTER — Emergency Department (HOSPITAL_COMMUNITY)
Admission: EM | Admit: 2013-10-29 | Discharge: 2013-10-29 | Disposition: A | Discharge status: Home / Self Care | Payer: Medicaid Other | Attending: Emergency Medicine | Admitting: Emergency Medicine

## 2013-10-29 DIAGNOSIS — R109 Unspecified abdominal pain: Secondary | ICD-10-CM

## 2013-10-29 LAB — URINE MICROSCOPIC-ADD ON

## 2013-10-29 LAB — BASIC METABOLIC PANEL
ANION GAP: 15 (ref 5–15)
BUN: 16 mg/dL (ref 6–23)
CHLORIDE: 103 meq/L (ref 96–112)
CO2: 24 mEq/L (ref 19–32)
Calcium: 9.3 mg/dL (ref 8.4–10.5)
Creatinine, Ser: 1.11 mg/dL — ABNORMAL HIGH (ref 0.50–1.10)
GFR calc Af Amer: 78 mL/min — ABNORMAL LOW (ref >90)
GFR calc non Af Amer: 67 mL/min — ABNORMAL LOW (ref >90)
Glucose, Bld: 109 mg/dL — ABNORMAL HIGH (ref 70–99)
POTASSIUM: 3.7 meq/L (ref 3.7–5.3)
Sodium: 142 mEq/L (ref 137–147)

## 2013-10-29 LAB — URINALYSIS, ROUTINE W REFLEX MICROSCOPIC
GLUCOSE, UA: NEGATIVE mg/dL
KETONES UR: NEGATIVE mg/dL
Nitrite: NEGATIVE
PROTEIN: 30 mg/dL — AB
Specific Gravity, Urine: 1.035 — ABNORMAL HIGH (ref 1.005–1.030)
Urobilinogen, UA: 1 mg/dL (ref 0.0–1.0)
pH: 6 (ref 5.0–8.0)

## 2013-10-29 LAB — CBC WITH DIFFERENTIAL/PLATELET
Basophils Absolute: 0 10*3/uL (ref 0.0–0.1)
Basophils Relative: 0 % (ref 0–1)
Eosinophils Absolute: 0.1 10*3/uL (ref 0.0–0.7)
Eosinophils Relative: 1 % (ref 0–5)
HCT: 35.1 % — ABNORMAL LOW (ref 36.0–46.0)
HEMOGLOBIN: 11.4 g/dL — AB (ref 12.0–15.0)
LYMPHS PCT: 47 % — AB (ref 12–46)
Lymphs Abs: 4.1 10*3/uL — ABNORMAL HIGH (ref 0.7–4.0)
MCH: 30.3 pg (ref 26.0–34.0)
MCHC: 32.5 g/dL (ref 30.0–36.0)
MCV: 93.4 fL (ref 78.0–100.0)
MONOS PCT: 5 % (ref 3–12)
Monocytes Absolute: 0.4 10*3/uL (ref 0.1–1.0)
NEUTROS ABS: 4.2 10*3/uL (ref 1.7–7.7)
NEUTROS PCT: 47 % (ref 43–77)
Platelets: 399 10*3/uL (ref 150–400)
RBC: 3.76 MIL/uL — ABNORMAL LOW (ref 3.87–5.11)
RDW: 13.2 % (ref 11.5–15.5)
WBC: 8.8 10*3/uL (ref 4.0–10.5)

## 2013-10-29 LAB — PREGNANCY, URINE: Preg Test, Ur: NEGATIVE

## 2013-10-29 MED ORDER — OXYCODONE-ACETAMINOPHEN 5-325 MG PO TABS: 1.0000 | ORAL_TABLET | ORAL | Status: DC | PRN

## 2013-10-29 MED ORDER — MORPHINE SULFATE 4 MG/ML IJ SOLN
4.0000 mg | Freq: Once | INTRAMUSCULAR | Status: DC
  Filled 2013-10-29: qty 1

## 2013-10-29 MED ORDER — HYDROMORPHONE HCL PF 1 MG/ML IJ SOLN
1.0000 mg | Freq: Once | INTRAMUSCULAR | Status: AC
  Administered 2013-10-29: 1 mg via INTRAVENOUS
  Filled 2013-10-29: qty 1

## 2013-10-29 MED ORDER — SODIUM CHLORIDE 0.9 % IV BOLUS (SEPSIS)
1000.0000 mL | Freq: Once | INTRAVENOUS | Status: AC
  Administered 2013-10-29: 1000 mL via INTRAVENOUS

## 2013-10-29 MED ORDER — ONDANSETRON HCL 4 MG/2ML IJ SOLN
4.0000 mg | Freq: Once | INTRAMUSCULAR | Status: AC
  Administered 2013-10-29: 4 mg via INTRAVENOUS
  Filled 2013-10-29: qty 2

## 2013-10-29 NOTE — ED Notes (Signed)
Pt states she has a hx of UTI's and kidney stones, thinks she is having kidney pain, states having lower back and bil flank pain, states it radiates from side to side, states also having urinary freq and burning.

## 2013-10-29 NOTE — Discharge Instructions (Signed)
Take Percocet as needed for pain. Follow up with [facility] regarding your symptoms. Follow up with your doctor. Refer to attached documents for more information.

## 2013-10-29 NOTE — ED Provider Notes (Signed)
Medical screening examination/treatment/procedure(s) were performed by non-physician practitioner and as supervising physician I was immediately available for consultation/collaboration.   EKG Interpretation None       Kalman Drape, MD 10/29/13 [phone]

## 2013-10-29 NOTE — ED Notes (Signed)
Pt states that she has had lower back pain rt worse than left x 3 days; pt c/o nausea and vomiting; pt states that she has had urinary frequency and urgency

## 2013-10-29 NOTE — ED Provider Notes (Signed)
CSN: [id number]     Arrival date & time 10/28/13  2248 History   First MD Initiated Contact with Patient 10/29/13 [phone]     Chief Complaint  Patient presents with  . Flank Pain     (Consider location/radiation/quality/duration/timing/severity/associated sxs/prior Treatment) HPI Comments: Patient is a 28 year old female with a past medical history of schizoaffective disorder, chronic flank pain and kidney stones who presents with bilateral flank pain for the past 3 days. The pain is worse on the left than the right. The pain is located in bilateral flanks and does not radiate. The pain is described as sharp and severe. The pain started gradually and progressively worsened since the onset. No alleviating/aggravating factors. The patient has tried 10 mg Percocet for symptoms without relief. Associated symptoms include nausea, vomiting, and urinary frequency and urgency. Patient denies fever, headache, diarrhea, chest pain, SOB, constipation, abnormal vaginal bleeding/discharge. Patient's last documented kidney stone was 2013.      Past Medical History  Diagnosis Date  . 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 Brain tumor   . Schizoaffective disorder   . Anxiety   . Depression   . Seizures   . Kidney stones   . Kidney stone   . Cancer 1991    ovarian   Past Surgical History  Procedure Laterality Date  . Abdominal hysterectomy      partial  . Kidney stone removal    . Appendectomy     Family History  Problem Relation Age of Onset  . Diabetes type II Other   . Asthma Mother    History  Substance Use Topics  . Smoking status: Former Smoker -- 0.25 packs/day for 2 years    Types: Cigarettes    Quit date: 07/27/2012  . Smokeless tobacco: Never Used  . Alcohol Use: No   OB History   Grav Para Term Preterm Abortions TAB SAB Ect Mult Living                 Review of Systems  Constitutional: Negative for fever, chills and fatigue.  HENT: Negative for trouble  swallowing.   Eyes: Negative for visual disturbance.  Respiratory: Negative for shortness of breath.   Cardiovascular: Negative for chest pain and palpitations.  Gastrointestinal: Positive for nausea and vomiting. Negative for abdominal pain and diarrhea.  Genitourinary: Positive for flank pain. Negative for dysuria and difficulty urinating.  Musculoskeletal: Negative for arthralgias and neck pain.  Skin: Negative for color change.  Neurological: Negative for dizziness and weakness.  Psychiatric/Behavioral: Negative for dysphoric mood.      Allergies  Ketorolac tromethamine; Lortab; and Tramadol  Home Medications   Prior to Admission medications   Medication Sig Start Date End Date Taking? Authorizing Provider  albuterol (ACCUNEB) 1.25 MG/3ML nebulizer solution Take 1 ampule by nebulization 3 (three) times daily.   Yes Historical Provider, MD  albuterol (PROAIR HFA) 108 (90 BASE) MCG/ACT inhaler Inhale 2 puffs into the lungs every 6 (six) hours as needed for wheezing or shortness of breath.   Yes Historical Provider, MD  calcium carbonate (TUMS - DOSED IN MG ELEMENTAL CALCIUM) 500 MG chewable tablet Chew 2 tablets by mouth daily as needed for indigestion or heartburn.   Yes Historical Provider, MD  famotidine (PEPCID) 20 MG tablet One at bedtime 09/29/13  Yes Tanda Rockers, MD  nebivolol (BYSTOLIC) 10 MG tablet Take 1 tablet (10 mg total) by mouth daily. 10/09/13  Yes Tanda Rockers, MD  oxyCODONE-acetaminophen (PERCOCET) 10-325 MG per tablet 1-2 every 4 hours as needed for cough 10/09/13  Yes Tanda Rockers, MD   BP 146/92  Pulse 87  Temp(Src) 98 F (36.7 C) (Oral)  Resp 20  SpO2 94% Physical Exam  Nursing note and vitals reviewed. Constitutional: She appears well-developed and well-nourished. No distress.  HENT:  Head: Normocephalic and atraumatic.  Eyes: Conjunctivae and EOM are normal.  Neck: Normal range of motion.  Cardiovascular: Normal rate and regular rhythm.  Exam  reveals no gallop and no friction rub.   No murmur heard. Pulmonary/Chest: Effort normal and breath sounds normal. She has no wheezes. She has no rales. She exhibits no tenderness.  Abdominal: Soft. She exhibits no distension. There is no tenderness. There is no rebound and no guarding.  Obese abdomen  Genitourinary:  No CVA tenderness.   Musculoskeletal: Normal range of motion.  No midline spine tenderness to palpation. Bilateral lumbar paraspinal tenderness to palpation.   Neurological: She is alert. Coordination normal.  Speech is goal-oriented. Moves limbs without ataxia.   Skin: Skin is warm and dry.  Psychiatric: She has a normal mood and affect. Her behavior is normal.    ED Course  Procedures (including critical care time) Labs Review Labs Reviewed  URINALYSIS, ROUTINE W REFLEX MICROSCOPIC - Abnormal; Notable for the following:    APPearance TURBID (*)    Specific Gravity, Urine 1.035 (*)    Hgb urine dipstick TRACE (*)    Bilirubin Urine SMALL (*)    Protein, ur 30 (*)    Leukocytes, UA LARGE (*)    All other components within normal limits  CBC WITH DIFFERENTIAL - Abnormal; Notable for the following:    RBC 3.76 (*)    Hemoglobin 11.4 (*)    HCT 35.1 (*)    Lymphocytes Relative 47 (*)    Lymphs Abs 4.1 (*)    All other components within normal limits  BASIC METABOLIC PANEL - Abnormal; Notable for the following:    Glucose, Bld 109 (*)    Creatinine, Ser 1.11 (*)    GFR calc non Af Amer 67 (*)    GFR calc Af Amer 78 (*)    All other components within normal limits  URINE MICROSCOPIC-ADD ON - Abnormal; Notable for the following:    Squamous Epithelial / LPF MANY (*)    Crystals CA OXALATE CRYSTALS (*)    All other components within normal limits  URINE CULTURE  PREGNANCY, URINE    Imaging Review No results found.   EKG Interpretation None      MDM   Final diagnoses:  Bilateral flank pain    2:14 AM Labs pending. Urinalysis likely contaminated due  to epithelial cells. Vitals stable and patient afebrile.   3:36 AM Patient is now sleeping and reports improvement of her pain. No need for CT at this time. Patient advised to follow up with [facility] and her PCP. Vitals stable and patient afebrile.   [place], [place] 10/29/13 [phone]

## 2013-10-30 LAB — URINE CULTURE

## 2013-11-18 ENCOUNTER — Ambulatory Visit (HOSPITAL_BASED_OUTPATIENT_CLINIC_OR_DEPARTMENT_OTHER): Payer: Medicaid Other | Attending: Pulmonary Disease | Admitting: Radiology

## 2013-11-18 VITALS — Ht 65.0 in | Wt 311.0 lb

## 2013-11-18 DIAGNOSIS — G4733 Obstructive sleep apnea (adult) (pediatric): Secondary | ICD-10-CM | POA: Diagnosis not present

## 2013-11-20 ENCOUNTER — Other Ambulatory Visit: Payer: Self-pay | Admitting: Internal Medicine

## 2013-11-20 ENCOUNTER — Encounter (HOSPITAL_BASED_OUTPATIENT_CLINIC_OR_DEPARTMENT_OTHER): Payer: Medicaid Other

## 2013-11-20 MED ORDER — PANTOPRAZOLE SODIUM 40 MG PO TBEC: 40.0000 mg | DELAYED_RELEASE_TABLET | Freq: Every day | ORAL | Status: DC

## 2013-11-24 ENCOUNTER — Encounter: Payer: Self-pay | Admitting: Pulmonary Disease

## 2013-11-24 ENCOUNTER — Ambulatory Visit (INDEPENDENT_AMBULATORY_CARE_PROVIDER_SITE_OTHER): Payer: Medicaid Other | Admitting: Pulmonary Disease

## 2013-11-24 ENCOUNTER — Ambulatory Visit: Payer: Medicaid Other | Admitting: Adult Health

## 2013-11-24 VITALS — BP 130/60 | HR 100 | Temp 99.9°F | Ht 65.0 in | Wt 314.4 lb

## 2013-11-24 DIAGNOSIS — J209 Acute bronchitis, unspecified: Secondary | ICD-10-CM

## 2013-11-24 MED ORDER — AZITHROMYCIN 250 MG PO TABS: ORAL_TABLET | ORAL | Status: DC

## 2013-11-24 NOTE — Progress Notes (Signed)
   Subjective:    Patient ID: [name], female    DOB: Jul 23, 1985, 28 y.o.   MRN: [id number]  HPI Patient comes in today for an acute sick visit. She normally is followed by Dr. [name] for the upper airway cough syndrome, and gives a few week history of increasing cough, chest congestion, and purulent mucus.  She has had some low-grade temperatures as well. Additional symptoms include nausea with some mild vomiting, as well as diarrhea and malaise.   Review of Systems  Constitutional: Negative for fever and unexpected weight change.  HENT: Positive for congestion, postnasal drip, rhinorrhea, sinus pressure, sneezing, sore throat and trouble swallowing. Negative for dental problem, ear pain and nosebleeds.   Eyes: Negative for redness and itching.  Respiratory: Positive for cough, chest tightness, shortness of breath and wheezing.   Cardiovascular: Positive for chest pain. Negative for palpitations and leg swelling.  Gastrointestinal: Positive for nausea and vomiting.  Genitourinary: Negative for dysuria.  Musculoskeletal: Negative for joint swelling.  Skin: Negative for rash.  Neurological: Positive for headaches.  Hematological: Does not bruise/bleed easily.  Psychiatric/Behavioral: Negative for dysphoric mood. The patient is not nervous/anxious.        Objective:   Physical Exam Morbidly obese female in no acute distress Nose without purulence or discharge noted Neck without lymphadenopathy or thyromegaly Chest totally clear to auscultation, no wheezing Cardiac exam with regular rate and rhythm Lower extremities with edema noted, no cyanosis Alert and oriented, moves all 4 extremities.       Assessment & Plan:

## 2013-11-24 NOTE — Patient Instructions (Signed)
Will treat with a zpak for possible bacterial infection. Will need followup with nurse practitioner as recommended by Dr. [name].  Please schedule for next week. Can take mucinex dm, 2 in am and pm for your congestion until better.

## 2013-11-24 NOTE — Assessment & Plan Note (Signed)
The patient is describing an upper respiratory infection most consistent with acute bronchitis. It is unclear whether this is viral or bacterial, but I suspec the former given her other GI symptoms. The patient is very concerned because it has been persistent over the last few weeks, and we'll therefore treat her with a course of antibiotics. I have told her that if she is not improving, or certainly if her condition worsens, she is to go to the emergency room.

## 2013-11-25 ENCOUNTER — Encounter (HOSPITAL_COMMUNITY): Payer: Self-pay | Admitting: Emergency Medicine

## 2013-11-25 ENCOUNTER — Emergency Department (HOSPITAL_COMMUNITY)
Admission: EM | Admit: 2013-11-25 | Discharge: 2013-11-26 | Disposition: A | Discharge status: Home / Self Care | Payer: Medicaid Other | Attending: Emergency Medicine | Admitting: Emergency Medicine

## 2013-11-25 ENCOUNTER — Emergency Department (HOSPITAL_COMMUNITY): Payer: Medicaid Other

## 2013-11-25 DIAGNOSIS — R112 Nausea with vomiting, unspecified: Secondary | ICD-10-CM | POA: Insufficient documentation

## 2013-11-25 DIAGNOSIS — Z79899 Other long term (current) drug therapy: Secondary | ICD-10-CM | POA: Insufficient documentation

## 2013-11-25 DIAGNOSIS — Z8659 Personal history of other mental and behavioral disorders: Secondary | ICD-10-CM | POA: Diagnosis not present

## 2013-11-25 DIAGNOSIS — R000 Tachycardia, unspecified: Secondary | ICD-10-CM | POA: Diagnosis not present

## 2013-11-25 DIAGNOSIS — N390 Urinary tract infection, site not specified: Secondary | ICD-10-CM | POA: Insufficient documentation

## 2013-11-25 DIAGNOSIS — Z87891 Personal history of nicotine dependence: Secondary | ICD-10-CM | POA: Diagnosis not present

## 2013-11-25 DIAGNOSIS — Z8543 Personal history of malignant neoplasm of ovary: Secondary | ICD-10-CM | POA: Diagnosis not present

## 2013-11-25 DIAGNOSIS — Z86011 Personal history of benign neoplasm of the brain: Secondary | ICD-10-CM | POA: Diagnosis not present

## 2013-11-25 DIAGNOSIS — Z87442 Personal history of urinary calculi: Secondary | ICD-10-CM | POA: Insufficient documentation

## 2013-11-25 DIAGNOSIS — Z658 Other specified problems related to psychosocial circumstances: Secondary | ICD-10-CM | POA: Insufficient documentation

## 2013-11-25 LAB — COMPREHENSIVE METABOLIC PANEL
ALBUMIN: 3.5 g/dL (ref 3.5–5.2)
ALT: 14 U/L (ref 0–35)
AST: 15 U/L (ref 0–37)
Alkaline Phosphatase: 94 U/L (ref 39–117)
Anion gap: 14 (ref 5–15)
BUN: 13 mg/dL (ref 6–23)
CALCIUM: 9.2 mg/dL (ref 8.4–10.5)
CO2: 24 mEq/L (ref 19–32)
CREATININE: 1.01 mg/dL (ref 0.50–1.10)
Chloride: 105 mEq/L (ref 96–112)
GFR calc Af Amer: 87 mL/min — ABNORMAL LOW (ref >90)
GFR, EST NON AFRICAN AMERICAN: 75 mL/min — AB (ref >90)
Glucose, Bld: 111 mg/dL — ABNORMAL HIGH (ref 70–99)
Potassium: 3.6 mEq/L — ABNORMAL LOW (ref 3.7–5.3)
Sodium: 143 mEq/L (ref 137–147)
Total Bilirubin: 0.3 mg/dL (ref 0.3–1.2)
Total Protein: 7.8 g/dL (ref 6.0–8.3)

## 2013-11-25 LAB — URINALYSIS, ROUTINE W REFLEX MICROSCOPIC
Bilirubin Urine: NEGATIVE
Glucose, UA: NEGATIVE mg/dL
KETONES UR: NEGATIVE mg/dL
NITRITE: NEGATIVE
Protein, ur: 30 mg/dL — AB
SPECIFIC GRAVITY, URINE: 1.043 — AB (ref 1.005–1.030)
Urobilinogen, UA: 1 mg/dL (ref 0.0–1.0)
pH: 5.5 (ref 5.0–8.0)

## 2013-11-25 LAB — CBC WITH DIFFERENTIAL/PLATELET
BASOS ABS: 0 10*3/uL (ref 0.0–0.1)
Basophils Relative: 0 % (ref 0–1)
EOS PCT: 1 % (ref 0–5)
Eosinophils Absolute: 0.1 10*3/uL (ref 0.0–0.7)
HCT: 35 % — ABNORMAL LOW (ref 36.0–46.0)
Hemoglobin: 11.4 g/dL — ABNORMAL LOW (ref 12.0–15.0)
Lymphocytes Relative: 18 % (ref 12–46)
Lymphs Abs: 3.1 10*3/uL (ref 0.7–4.0)
MCH: 30.9 pg (ref 26.0–34.0)
MCHC: 32.6 g/dL (ref 30.0–36.0)
MCV: 94.9 fL (ref 78.0–100.0)
Monocytes Absolute: 0.6 10*3/uL (ref 0.1–1.0)
Monocytes Relative: 4 % (ref 3–12)
Neutro Abs: 13.3 10*3/uL — ABNORMAL HIGH (ref 1.7–7.7)
Neutrophils Relative %: 77 % (ref 43–77)
Platelets: 345 10*3/uL (ref 150–400)
RBC: 3.69 MIL/uL — ABNORMAL LOW (ref 3.87–5.11)
RDW: 13.1 % (ref 11.5–15.5)
WBC: 17.2 10*3/uL — ABNORMAL HIGH (ref 4.0–10.5)

## 2013-11-25 LAB — URINE MICROSCOPIC-ADD ON

## 2013-11-25 LAB — LIPASE, BLOOD: LIPASE: 32 U/L (ref 11–59)

## 2013-11-25 MED ORDER — ONDANSETRON HCL 4 MG/2ML IJ SOLN
4.0000 mg | Freq: Once | INTRAMUSCULAR | Status: AC
  Administered 2013-11-25: 4 mg via INTRAVENOUS

## 2013-11-25 MED ORDER — SODIUM CHLORIDE 0.9 % IV BOLUS (SEPSIS)
1000.0000 mL | Freq: Once | INTRAVENOUS | Status: AC
  Administered 2013-11-25: 1000 mL via INTRAVENOUS

## 2013-11-25 MED ORDER — CEPHALEXIN 500 MG PO CAPS: 500.0000 mg | ORAL_CAPSULE | Freq: Four times a day (QID) | ORAL | Status: DC

## 2013-11-25 MED ORDER — HYDROMORPHONE HCL 1 MG/ML IJ SOLN
1.0000 mg | Freq: Once | INTRAMUSCULAR | Status: AC
Start: 2013-11-25 — End: 2013-11-25
  Administered 2013-11-25: 1 mg via INTRAVENOUS
  Filled 2013-11-25: qty 1

## 2013-11-25 MED ORDER — HYDROMORPHONE HCL 1 MG/ML IJ SOLN
1.0000 mg | Freq: Once | INTRAMUSCULAR | Status: AC
  Administered 2013-11-25: 1 mg via INTRAVENOUS
  Filled 2013-11-25: qty 1

## 2013-11-25 MED ORDER — ONDANSETRON HCL 4 MG/2ML IJ SOLN
4.0000 mg | Freq: Once | INTRAMUSCULAR | Status: AC
  Filled 2013-11-25: qty 2

## 2013-11-25 MED ORDER — SODIUM CHLORIDE 0.9 % IV SOLN
INTRAVENOUS | Status: DC
  Administered 2013-11-25: 20:00:00 via INTRAVENOUS

## 2013-11-25 NOTE — ED Notes (Signed)
Patient unable to urinate at this time. 

## 2013-11-25 NOTE — ED Notes (Signed)
MD at bedside. 

## 2013-11-25 NOTE — ED Notes (Signed)
Pt c/o body aches, cough, nausea, vomiting that started on Monday.

## 2013-11-25 NOTE — Discharge Instructions (Signed)

## 2013-11-25 NOTE — ED Provider Notes (Addendum)
CSN: [id number]     Arrival date & time 11/25/13  1857 History   First MD Initiated Contact with Patient 11/25/13 1918     Chief Complaint  Patient presents with  . Fatigue  . Nausea  . Emesis  . Generalized Body Aches     (Consider location/radiation/quality/duration/timing/severity/associated sxs/prior Treatment) HPI Comments: Patient here complaining of body aches nonbilious vomiting and scratchy throat with dry cough. No reported fever at home. Some watery diarrhea. History of similar symptoms in the past which she says are responsive to IV fluids and hydromorphone along with antibiotics. Denies any abdominal or chest pain. Denies any dysuria or hematuria. No rashes noted. No headache or neck pain or photophobia. Symptoms persisted and not responsive to home meds. Nothing makes them worse  Patient is a 28 y.o. female presenting with vomiting. The history is provided by the patient.  Emesis   Past Medical History  Diagnosis Date  . 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 Brain tumor   . Schizoaffective disorder   . Anxiety   . Depression   . Seizures   . Kidney stones   . Kidney stone   . Cancer 1991    ovarian   Past Surgical History  Procedure Laterality Date  . Abdominal hysterectomy      partial  . Kidney stone removal    . Appendectomy     Family History  Problem Relation Age of Onset  . Diabetes type II Other   . Asthma Mother    History  Substance Use Topics  . Smoking status: Former Smoker -- 0.25 packs/day for 2 years    Types: Cigarettes    Quit date: 07/27/2012  . Smokeless tobacco: Never Used  . Alcohol Use: No   OB History   Grav Para Term Preterm Abortions TAB SAB Ect Mult Living                 Review of Systems  Gastrointestinal: Positive for vomiting.  All other systems reviewed and are negative.     Allergies  Ketorolac tromethamine; Lortab; and Tramadol  Home Medications   Prior to Admission medications   Medication  Sig Start Date End Date Taking? Authorizing Provider  albuterol (ACCUNEB) 1.25 MG/3ML nebulizer solution Take 1 ampule by nebulization 3 (three) times daily.    Historical Provider, MD  albuterol (PROAIR HFA) 108 (90 BASE) MCG/ACT inhaler Inhale 2 puffs into the lungs every 6 (six) hours as needed for wheezing or shortness of breath.    Historical Provider, MD  azithromycin (ZITHROMAX) 250 MG tablet Take as directed 11/24/13   Kathee Delton, MD  calcium carbonate (TUMS - DOSED IN MG ELEMENTAL CALCIUM) 500 MG chewable tablet Chew 2 tablets by mouth daily as needed for indigestion or heartburn.    Historical Provider, MD  famotidine (PEPCID) 20 MG tablet One at bedtime 09/29/13   Tanda Rockers, MD  montelukast (SINGULAIR) 10 MG tablet Take 10 mg by mouth at bedtime.    Historical Provider, MD  nebivolol (BYSTOLIC) 10 MG tablet Take 1 tablet (10 mg total) by mouth daily. 10/09/13   Tanda Rockers, MD  oxyCODONE-acetaminophen [facility]) 10-325 MG per tablet 1-2 every 4 hours as needed for cough 10/09/13   Tanda Rockers, MD  pantoprazole (PROTONIX) 40 MG tablet Take 1 tablet (40 mg total) by mouth daily. 11/20/13   Tanda Rockers, MD   BP 164/103  Pulse 109  Temp(Src) 98.6 F (37  C) (Oral)  Resp 22  SpO2 95% Physical Exam  Nursing note and vitals reviewed. Constitutional: She is oriented to person, place, and time. She appears well-developed and well-nourished.  Non-toxic appearance. No distress.  HENT:  Head: Normocephalic and atraumatic.  Eyes: Conjunctivae, EOM and lids are normal. Pupils are equal, round, and reactive to light.  Neck: Normal range of motion. Neck supple. No tracheal deviation present. No mass present.  Cardiovascular: Regular rhythm and normal heart sounds.  Tachycardia present.  Exam reveals no gallop.   No murmur heard. Pulmonary/Chest: Effort normal and breath sounds normal. No stridor. No respiratory distress. She has no decreased breath sounds. She has no wheezes. She has  no rhonchi. She has no rales.  Abdominal: Soft. Normal appearance and bowel sounds are normal. She exhibits no distension. There is no tenderness. There is no rebound and no CVA tenderness.  Musculoskeletal: Normal range of motion. She exhibits no edema and no tenderness.  Neurological: She is alert and oriented to person, place, and time. She has normal strength. No cranial nerve deficit or sensory deficit. GCS eye subscore is 4. GCS verbal subscore is 5. GCS motor subscore is 6.  Skin: Skin is warm and dry. No abrasion and no rash noted.  Psychiatric: She has a normal mood and affect. Her speech is normal and behavior is normal.    ED Course  Procedures (including critical care time) Labs Review Labs Reviewed  CBC WITH DIFFERENTIAL  COMPREHENSIVE METABOLIC PANEL  LIPASE, BLOOD  URINALYSIS, ROUTINE W REFLEX MICROSCOPIC    Imaging Review No results found.   EKG Interpretation None      MDM   Final diagnoses:  None   Patient given IV fluids and pain medication here. Was started on antibiotics for her urinary tract infection    Leota Jacobsen, MD 11/25/13 2213  Leota Jacobsen, MD 11/25/13 [id number]

## 2013-11-26 NOTE — ED Notes (Signed)
Pt declined discharge vitals stating she will miss her ride.

## 2013-11-27 DIAGNOSIS — G4730 Sleep apnea, unspecified: Secondary | ICD-10-CM

## 2013-11-27 NOTE — Progress Notes (Signed)
ATC PT NA. VM is full. WCB 

## 2013-11-27 NOTE — Sleep Study (Signed)
   NAME: [name] DATE OF BIRTH:  June 20, 1985 MEDICAL RECORD NUMBER [id number]  LOCATION: [facility]  PHYSICIAN: [place] OF STUDY: 11/18/2013  SLEEP STUDY TYPE: Nocturnal Polysomnogram               REFERRING PHYSICIAN: Kevaughn Ewing, Armando Reichert, MD  INDICATION FOR STUDY: Hypersomnia with sleep apnea  EPWORTH SLEEPINESS SCORE:  11 HEIGHT: 5\' 5"  (165.1 cm)  WEIGHT: 311 lb (141.069 kg)    Body mass index is 51.75 kg/(m^2).  NECK SIZE: 17 in.  MEDICATIONS: Reviewed in the sleep record  SLEEP ARCHITECTURE: The patient has a total sleep time of 331 minutes, with decreased slow-wave sleep for age and decreased quantity of REM. Sleep onset latency was normal at 9 minutes, and REM onset was prolonged at 211 minutes. Sleep efficiency was moderately reduced at 80%.  RESPIRATORY DATA: The patient was found to have 92 apneas and 185 obstructive hypopneas, giving her an AHI of 50 events per hour. The events occurred in all body positions, and there was moderate snoring noted throughout.  OXYGEN DATA: There was oxygen desaturation as low as 86% with the patient's obstructive events  CARDIAC DATA: No clinically significant arrhythmias were seen  MOVEMENT/PARASOMNIA: The patient had no significant limb movements or other abnormal behaviors noted.  IMPRESSION/ RECOMMENDATION:    1) severe obstructive sleep apnea/hypopnea syndrome, with an AHI of 50 events per hour and oxygen desaturation as low as 86%. Treatment for this degree of sleep apnea should focus primarily on a trial of CPAP while working aggressively on weight loss.    Kathee Delton Diplomate, American Board of Sleep Medicine  ELECTRONICALLY SIGNED ON:  11/27/2013, 9:00 AM [place] PH: [phone]   FX: [phone] [place]

## 2013-11-27 NOTE — Progress Notes (Signed)
Pt needs ov to review sleep study 

## 2013-11-30 ENCOUNTER — Ambulatory Visit: Payer: Medicaid Other | Admitting: Adult Health

## 2013-12-01 NOTE — Progress Notes (Signed)
ATC PT NA unable to leave VM WCB 

## 2013-12-02 ENCOUNTER — Encounter: Payer: Self-pay | Admitting: Adult Health

## 2013-12-02 ENCOUNTER — Ambulatory Visit (INDEPENDENT_AMBULATORY_CARE_PROVIDER_SITE_OTHER): Payer: Medicaid Other | Admitting: Adult Health

## 2013-12-02 ENCOUNTER — Emergency Department (HOSPITAL_COMMUNITY)
Admission: EM | Admit: 2013-12-02 | Discharge: 2013-12-02 | Discharge status: Against Medical Advice | Payer: Medicaid Other

## 2013-12-02 ENCOUNTER — Telehealth: Payer: Self-pay | Admitting: Internal Medicine

## 2013-12-02 VITALS — BP 134/84 | HR 101 | Temp 98.4°F | Ht 65.0 in | Wt 314.0 lb

## 2013-12-02 DIAGNOSIS — R05 Cough: Secondary | ICD-10-CM

## 2013-12-02 DIAGNOSIS — R058 Other specified cough: Secondary | ICD-10-CM

## 2013-12-02 MED ORDER — FLUTICASONE FUROATE-VILANTEROL 100-25 MCG/INH IN AEPB: 1.0000 | INHALATION_SPRAY | Freq: Every day | RESPIRATORY_TRACT | Status: DC

## 2013-12-02 NOTE — Assessment & Plan Note (Signed)
UACS -refractory despite aggressive trigger control  I advised her I would not be able to prescribe narcotics today for her cough  She is allergic to hydrocodone  I did look her up on the [place] controlled substance directory and she is listed with multiple filings of percocet , xanax, valium with different providers along with multiple ER visits with IV pain meds given.   i have offered to evaluate her cough further with PFT and trial of ICS/Laba inhaler along with cough trigger control   Plan  Begin BREO 1 puff daily , rinse after use.  Delsym 2 tsp Twice daily  As needed  Cough  Mucinex Twice daily  As needed  Cough/congestioin  Follow up Dr. [name]  In 2-3 weeks with PFT  Please contact office for sooner follow up if symptoms do not improve or worsen or seek emergency care

## 2013-12-02 NOTE — ED Notes (Addendum)
Pt up to registration, handed them her pt labels and stated she had to leave to pick up children and would return later. Pt not seen in triage.

## 2013-12-02 NOTE — Telephone Encounter (Signed)
Called and spoke to pt. Pt c/o prod cough with brown, yellow and bright red blood. Pt states the blood is the same amount as if you were to get a CBG. Pt also c/o hoarseness, increase in SOB,  fever of 101, BIL rib pain when coughing and pt states she coughs so hard to point of emesis. Pt has not taken any medication of the counter. Pt went to ED today after OV with TP and left early to pick up her son and stated she would return to ED.  Dr. [name] please advise.  Allergies  Allergen Reactions  . Ketorolac Tromethamine Hives  . Lortab [Hydrocodone-Acetaminophen] Hives    Tolerates acetaminophen  . Tramadol Hives

## 2013-12-02 NOTE — Telephone Encounter (Signed)
Let her know I'd be happy to try to help but only if she first brings all medications, all bottles, all inhalers with her to office from all doctors - the last time she brought just empty bottles so I was unable to help identify her problem.   If she comes without the actual pills in the correct bottles then I will have to discharge her from our care as this not the responsible way to provide good medical care.

## 2013-12-02 NOTE — Patient Instructions (Addendum)
Begin BREO 1 puff daily , rinse after use.  Delsym 2 tsp Twice daily  As needed  Cough  Mucinex Twice daily  As needed  Cough/congestioin  Follow up Dr. [name]  In 2-3 weeks with PFT  Please contact office for sooner follow up if symptoms do not improve or worsen or seek emergency care

## 2013-12-02 NOTE — Telephone Encounter (Signed)
Spoke with the pt and notified of recs per MW  She verbalized understanding  She states will come in Friday at 4:30  She is aware to bring all meds from all docs and not just empty bottles  She c/o "pain all over"- I advised to seek emergent care if feels can not wait until next app She verbalized understanding

## 2013-12-02 NOTE — Progress Notes (Signed)
Subjective:    Patient ID: [name], female    DOB: Nov 19, 1985  MRN: [id number]   Brief patient profile:  27 yobf variably described as former smoker( she reports quit summer 2014)  with  dx of asthma age 28  requiring daily rx and freq ER eval - does not remember ever living s daily treatments since age 41  - never saw specialist - worse than usual since  summer of 2015 so referred to pulmonary clinic by Dr Tommi Rumps 09/02/2013    Brief patient profile:  09/02/2013 1st [facility] office visit/ [name] on ACEi Chief Complaint  Patient presents with  . Pulmonary Consult    Referred per Dr. Kevan Ny. Pt c/o cough x 4 wks. Cough is prod with minimal yellow sputum. She also c/o pain under breasts when she coughs. She also c/o DOE with walking minimal distances.   cough is more than usual, now hurts to cough x 4 weeks Doe x across the room  rec For pain or cough take percocet up to every 4 hours as needed  Think of your respiratory medications as multiple steps you can take to control your symptoms and avoid having to go to the ER Plan A= automatic is your maintenance daily no matter what meds: symbicort 80 Take 2 puffs first thing in am and then another 2 puffs about 12 hours later.  Plan B = backup  only use after you've used your maintenance (Plan A) medication, and only if you can't catch your breath: albuterol up to 2 puffs every 4h Plan C only use after you've used plan A and B and still can't catch your breath: neb albuterol up to every 4 hours  Plan D(for Doctor):  If you've used A thru C and not doing a lot better or still needing C more than a once a day,  D = call the doctor for evaluation asap Plan E (for ER):  If still not able to catch your breath, even after using your nebulizer up to every 4 hours, go to ER     09/29/2013 f/u ov/Wert re: refractory cough ? Any asthma? Chief Complaint  Patient presents with  . Follow-up    Pt c/o increased cough and SOB  for the past wk. Coughing until the point of vomiting. She still c/o pain underneath breasts- esp when she coughs. She is using rescue inhaler 3-4 times per day and has used neb x 3 since last visit.    did not follow a single instruction as written, lost her rx for percocet and "called all the pharmacies to let them know" - affect continues to be unusual.  No mucus with cough unless vomits. Sob only with exertion rec Continue  benicar 40/25 one half daily until you run out of it, then fill the prescription for diovan/valsartan For pain or cough take percocet up to 2 every 4 hours as needed  Pantoprazole (protonix) 40 mg   Take 30-60 min before first meal of the day and Pepcid 20 mg one bedtime     GERD   Diet  Prednisone 10 mg take  4 each am x 2 days,   2 each am x 2 days,  1 each am x 2 days and stop   Think of your respiratory medications as multiple steps you can take to control your symptoms and avoid having to go to the ER Plan A= automatic is your maintenance daily no matter what meds: symbicort 80  Take 2 puffs first thing in am and then another 2 puffs about 12 hours later.  Plan B = backup  only use after you've used your maintenance (Plan A) medication, and only if you can't catch your breath: albuterol up to 2 puffs every 4h and keep it with you  Plan C only use after you've used plan A and B and still can't catch your breath: neb albuterol up to every 4 hours  Plan D(for Doctor):  If you've used A thru C and not doing a lot better or still needing C more than a once a day,  D = call the doctor for evaluation asap Plan E (for ER):  If still not able to catch your breath, even after using your nebulizer up to every 4 hours, go to ER       10/09/2013  Acute  ov/Wert re: refractory cough with predominantly  Chief Complaint  Patient presents with  . Acute Visit    Pt c/o increased cough x 1 wk- prod with yellow to brown sputum to the point of vomiting. She has laryngitis.   brought all  of her bottles- they  are all empty "I took the last one of each this am" - did not bring neb solution - says percocet x 2 knocks her out so can't take it during the day but does not cough when she takes it.  Are you on singulair A yes Q where's the bottle A I just ran out it's being refilled >>pred taper , percocet and change to bystolic   46/07/5991 Acute OV  Complains of persistent cough . Was seen last week in office for bronchitis given a Zpack . Symptoms worsened and went to ER . Felt to have UTI, rx Keflx . Urine cx neg.  Was given IV Fluids and pain meds.  Says she has not improved from coughing . Coughs so hard she can vomit. She request rx for Percocet /Oxycodone-"this is the only thing that helps" .  I discussed alternative to narcotics and she says medicaid will not pay for this.  She does cough up thick mucus yellow, clear and what at times.  CXR last week in ER w/ no acute processs.  She was given percocet 2-3 months ago for cough , actually got  2 rx because she says one rx was lost.  No fever , hemopytsis, orthopnea, edema or n/v.d        Current Medications, Allergies, Complete Past Medical History, Past Surgical History, Family History, and Social History were reviewed in [employer] record.  ROS  The following are not active complaints unless bolded sore throat, dysphagia, dental problems, itching, sneezing,  nasal congestion or excess/ purulent secretions, ear ache,   fever, chills, sweats, unintended wt loss, pleuritic or exertional cp, hemoptysis,  orthopnea pnd or leg swelling, presyncope, palpitations, heartburn, abdominal pain, anorexia, nausea, vomiting, diarrhea  or change in bowel or urinary habits, change in stools or urine, dysuria,hematuria,  rash, arthralgias, visual complaints, headache, numbness weakness or ataxia or problems with walking or coordination,  change in mood/affect or memory.                        Objective:    Physical Exam  amb obese bf with very unusual affect and gagging/harsh cough with extreme hoarseness   09/29/2013          309 > 10/09/2013   311 >314 12/02/2013  HEENT: nl dentition, turbinates, and orophanx. Nl external ear canals without cough reflex   NECK :  without JVD/Nodes/TM/ nl carotid upstrokes bilaterally   LUNGS: no acc muscle use, clear to A and P bilaterally , no wheezing , barking cough    CV:  RRR  no s3 or murmur or increase in P2, no edema   ABD:  soft and nontender with nl excursion in the supine position. No bruits or organomegaly, bowel sounds nl  MS:  warm without deformities, calf tenderness, cyanosis or clubbing  SKIN: warm and dry without lesions         CXR  09/29/2013 :  Lungs are clear. Heart size and pulmonary vascularity are within normal limits. No adenopathy. No bone lesions  CXR 11/25/13 >NAD      Assessment & Plan:

## 2013-12-02 NOTE — Telephone Encounter (Signed)
Pt calling back again stating that she is hurting real bad and that when she saw tammy today she didn't want to put her on the hydrocodoine  Syrup b/c it had vicodine in it and she is allergic to it please advise.Hillery Hunter

## 2013-12-03 MED ORDER — LEVALBUTEROL HCL 0.63 MG/3ML IN NEBU
0.6300 mg | INHALATION_SOLUTION | Freq: Once | RESPIRATORY_TRACT | Status: AC
  Administered 2013-12-03: 0.63 mg via RESPIRATORY_TRACT

## 2013-12-03 NOTE — Progress Notes (Signed)
Pt scheduled appt to come in and see [facility] 10/22

## 2013-12-03 NOTE — Progress Notes (Signed)
Chart reviewed as well as ov note and agree with a/p

## 2013-12-03 NOTE — Addendum Note (Signed)
Addended by: Parke Poisson E on: 12/03/2013 02:31 PM   Modules accepted: Orders

## 2013-12-04 ENCOUNTER — Ambulatory Visit (INDEPENDENT_AMBULATORY_CARE_PROVIDER_SITE_OTHER): Payer: Medicaid Other | Admitting: Internal Medicine

## 2013-12-04 ENCOUNTER — Encounter: Payer: Self-pay | Admitting: Internal Medicine

## 2013-12-04 VITALS — BP 132/80 | HR 97 | Temp 98.5°F | Ht 63.0 in | Wt 309.0 lb

## 2013-12-04 DIAGNOSIS — R058 Other specified cough: Secondary | ICD-10-CM

## 2013-12-04 DIAGNOSIS — R05 Cough: Secondary | ICD-10-CM

## 2013-12-04 MED ORDER — FAMOTIDINE 20 MG PO TABS: ORAL_TABLET | ORAL | Status: DC

## 2013-12-04 MED ORDER — FLUTTER DEVI: Status: DC

## 2013-12-04 MED ORDER — PREDNISONE 10 MG PO TABS: ORAL_TABLET | ORAL | Status: DC

## 2013-12-04 NOTE — Patient Instructions (Addendum)
Best approach for cough is delsym 2 tsp every 12 hours and use the flutter valve  Pantoprazole (protonix) 40 mg   Take 30-60 min before first meal of the day and Pepcid 20 mg one bedtime until return to office - this is the best way to tell whether stomach acid is contributing to your problem.    Best approach for breathing is your nebulizer use it up to every 4 hours if needed   Prednisone 10 mg take  4 each am x 2 days,   2 each am x 2 days,  1 each am x 2 days and stop  GERD (REFLUX)  is an extremely common cause of respiratory symptoms, many times with no significant heartburn at all.    It can be treated with medication, but also with lifestyle changes including avoidance of late meals, excessive alcohol, smoking cessation, and avoid fatty foods, chocolate, peppermint, colas, red wine, and acidic juices such as orange juice.  NO MINT OR MENTHOL PRODUCTS SO NO COUGH DROPS  USE SUGARLESS CANDY INSTEAD (jolley ranchers or Stover's)  NO OIL BASED VITAMINS - use powdered substitutes.    Please schedule a follow up office visit in 1 week , sooner if needed with all medications/ neb solutions/ flutter valve in hand (including any over the counter) to be sure you are taking these correctly before we make any further changes to your medication plan

## 2013-12-04 NOTE — Progress Notes (Signed)
Subjective:    Patient ID: [name], female    DOB: 04-22-1985  MRN: [id number]   Brief patient profile:  39 yobf variably described as former smoker( she reports quit summer 2014)  with  dx of asthma age 28  requiring daily rx and freq ER eval - does not remember ever living s daily treatments since age 58  - never saw specialist - worse than usual since  summer of 2015 so referred to pulmonary clinic by Dr Tommi Rumps 09/02/2013    Brief patient profile:  09/02/2013 1st [facility] office visit/ [name] on ACEi Chief Complaint  Patient presents with  . Pulmonary Consult    Referred per Dr. Kevan Ny. Pt c/o cough x 4 wks. Cough is prod with minimal yellow sputum. She also c/o pain under breasts when she coughs. She also c/o DOE with walking minimal distances.   cough is more than usual, now hurts to cough x 4 weeks Doe x across the room  rec For pain or cough take percocet up to every 4 hours as needed  Think of your respiratory medications as multiple steps you can take to control your symptoms and avoid having to go to the ER Plan A= automatic is your maintenance daily no matter what meds: symbicort 80 Take 2 puffs first thing in am and then another 2 puffs about 12 hours later.  Plan B = backup  only use after you've used your maintenance (Plan A) medication, and only if you can't catch your breath: albuterol up to 2 puffs every 4h Plan C only use after you've used plan A and B and still can't catch your breath: neb albuterol up to every 4 hours  Plan D(for Doctor):  If you've used A thru C and not doing a lot better or still needing C more than a once a day,  D = call the doctor for evaluation asap Plan E (for ER):  If still not able to catch your breath, even after using your nebulizer up to every 4 hours, go to ER     09/29/2013 f/u ov/[name] re: refractory cough ? Any asthma? Chief Complaint  Patient presents with  . Follow-up    Pt c/o increased cough and SOB  for the past wk. Coughing until the point of vomiting. She still c/o pain underneath breasts- esp when she coughs. She is using rescue inhaler 3-4 times per day and has used neb x 3 since last visit.    did not follow a single instruction as written, lost her rx for percocet and "called all the pharmacies to let them know" - affect continues to be unusual.  No mucus with cough unless vomits. Sob only with exertion rec Continue  benicar 40/25 one half daily until you run out of it, then fill the prescription for diovan/valsartan For pain or cough take percocet up to 2 every 4 hours as needed  Pantoprazole (protonix) 40 mg   Take 30-60 min before first meal of the day and Pepcid 20 mg one bedtime     GERD   Diet  Prednisone 10 mg take  4 each am x 2 days,   2 each am x 2 days,  1 each am x 2 days and stop   Think of your respiratory medications as multiple steps you can take to control your symptoms and avoid having to go to the ER Plan A= automatic is your maintenance daily no matter what meds: symbicort 80  Take 2 puffs first thing in am and then another 2 puffs about 12 hours later.  Plan B = backup  only use after you've used your maintenance (Plan A) medication, and only if you can't catch your breath: albuterol up to 2 puffs every 4h and keep it with you  Plan C only use after you've used plan A and B and still can't catch your breath: neb albuterol up to every 4 hours  Plan D(for Doctor):  If you've used A thru C and not doing a lot better or still needing C more than a once a day,  D = call the doctor for evaluation asap Plan E (for ER):  If still not able to catch your breath, even after using your nebulizer up to every 4 hours, go to ER       10/09/2013  Acute  ov/[name] re: refractory cough with predominantly  Chief Complaint  Patient presents with  . Acute Visit    Pt c/o increased cough x 1 wk- prod with yellow to brown sputum to the point of vomiting. She has laryngitis.   brought all  of her bottles- they  are all empty "I took the last one of each this am" - did not bring neb solution - says percocet x 2 knocks her out so can't take it during the day but does not cough when she takes it.  Are you on singulair A yes Q where's the bottle A I just ran out it's being refilled rec Stop prednisone and symbicort and all inhalers  bystolic 10 mg daily is you new blood pressure pill  Only use your albuterol neb as a rescue medication  For cough take percocet 10  1-2 every 4 hours as needed Pantoprazole (protonix) 40 mg   Take 30-60 min before first meal of the day and Pepcid 20 mg one bedtime until return to office - this is the best way to tell whether stomach acid is contributing to your problem.   GERD diet  Return with all medications from all doctors when you see Tammy in 4 days and we will count your pills to make sure your are taking them correctly before considering changing any of them  Late add:  All of her med bottles are completely empty and were refilled today so when seen in 4 days should be able to verify 4 days absent from each one if she fills them today and immediately starts taking them as rec   Did not return in 4 d as rec " death in family   12/19/2013 [facility] eval rec Will treat with a zpak for possible bacterial infection. Will need followup with nurse practitioner as recommended by Dr. [name].  Please schedule for next week. Can take mucinex dm, 2 in am and pm for your congestion until better.  12/02/13 NP eval rec Begin BREO 1 puff daily , rinse after use.  Delsym 2 tsp Twice daily  As needed  Cough  Mucinex Twice daily  As needed  Cough/congestioin  Follow up Dr. [name]  In 2-3 weeks with PFT    12/04/2013 f/u ov/[name] re: refractory cough / harsh barking with gen cp ant only with cough  Chief Complaint  Patient presents with  . Follow-up    Cough, SOB, chest pain. bronchitis, lost voice, cannot hear out of both ears, fever  no purulent sputum or nasal  secretions. Did not bring all active meds, not taking ppi as directed. Using hfa instead  of neb p I warned her previously this was not effective in severe upper airway coughing.     No obvious day to day or daytime variabilty or assoc  chest tightness, subjective wheeze or overt sinus   symptoms. No unusual exp hx or h/o childhood pna/ asthma or knowledge of premature birth.   Also denies any obvious fluctuation of symptoms with weather or environmental changes or other aggravating or alleviating factors except as outlined above   Current Medications, Allergies, Complete Past Medical History, Past Surgical History, Family History, and Social History were reviewed in [employer] record.  ROS  The following are not active complaints unless bolded sore throat, dysphagia, dental problems, itching, sneezing,  nasal congestion or excess/ purulent secretions, ear ache,   fever, chills, sweats, unintended wt loss, pleuritic or exertional cp, hemoptysis,  orthopnea pnd or leg swelling, presyncope, palpitations, heartburn, abdominal pain, anorexia, nausea, vomiting, diarrhea  or change in bowel or urinary habits, change in stools or urine, dysuria,hematuria,  rash, arthralgias, visual complaints, headache, numbness weakness or ataxia or problems with walking or coordination,  change in mood/affect or memory.                        Objective:   Physical Exam  amb obese bf with very unusual affect  with extreme hoarseness pseudowheeze   09/29/2013          309 > 10/09/2013   311 > 12/04/2013  Wt Readings from Last 3 Encounters:  09/02/13 316 lb 9.6 oz (143.609 kg)  05/07/11 293 lb (132.904 kg)  05/07/11 293 lb (132.904 kg)     HEENT: nl dentition, turbinates, and orophanx. Nl external ear canals without cough reflex   NECK :  without JVD/Nodes/TM/ nl carotid upstrokes bilaterally   LUNGS: no acc muscle use, clear to A and P bilaterally without cough on insp or exp  maneuvers   CV:  RRR  no s3 or murmur or increase in P2, no edema   ABD:  soft and nontender with nl excursion in the supine position. No bruits or organomegaly, bowel sounds nl  MS:  warm without deformities, calf tenderness, cyanosis or clubbing  SKIN: warm and dry without lesions         CXR  11/25/13  No active cardiopulmonary disease      Assessment & Plan:

## 2013-12-05 ENCOUNTER — Emergency Department (HOSPITAL_COMMUNITY)
Admission: EM | Admit: 2013-12-05 | Discharge: 2013-12-05 | Disposition: A | Discharge status: Home / Self Care | Payer: Medicaid Other | Attending: Emergency Medicine | Admitting: Emergency Medicine

## 2013-12-05 ENCOUNTER — Encounter (HOSPITAL_COMMUNITY): Payer: Self-pay | Admitting: Emergency Medicine

## 2013-12-05 ENCOUNTER — Encounter: Payer: Self-pay | Admitting: Internal Medicine

## 2013-12-05 DIAGNOSIS — Z87891 Personal history of nicotine dependence: Secondary | ICD-10-CM | POA: Diagnosis not present

## 2013-12-05 DIAGNOSIS — Z9089 Acquired absence of other organs: Secondary | ICD-10-CM | POA: Insufficient documentation

## 2013-12-05 DIAGNOSIS — Z87442 Personal history of urinary calculi: Secondary | ICD-10-CM | POA: Diagnosis not present

## 2013-12-05 DIAGNOSIS — Z90710 Acquired absence of both cervix and uterus: Secondary | ICD-10-CM | POA: Insufficient documentation

## 2013-12-05 DIAGNOSIS — R112 Nausea with vomiting, unspecified: Secondary | ICD-10-CM | POA: Diagnosis not present

## 2013-12-05 DIAGNOSIS — N739 Female pelvic inflammatory disease, unspecified: Secondary | ICD-10-CM | POA: Diagnosis not present

## 2013-12-05 DIAGNOSIS — Z8659 Personal history of other mental and behavioral disorders: Secondary | ICD-10-CM | POA: Diagnosis not present

## 2013-12-05 DIAGNOSIS — Z8543 Personal history of malignant neoplasm of ovary: Secondary | ICD-10-CM | POA: Diagnosis not present

## 2013-12-05 DIAGNOSIS — Z3202 Encounter for pregnancy test, result negative: Secondary | ICD-10-CM | POA: Insufficient documentation

## 2013-12-05 DIAGNOSIS — N390 Urinary tract infection, site not specified: Secondary | ICD-10-CM | POA: Insufficient documentation

## 2013-12-05 DIAGNOSIS — R000 Tachycardia, unspecified: Secondary | ICD-10-CM | POA: Diagnosis not present

## 2013-12-05 DIAGNOSIS — Z79899 Other long term (current) drug therapy: Secondary | ICD-10-CM | POA: Insufficient documentation

## 2013-12-05 DIAGNOSIS — Z85841 Personal history of malignant neoplasm of brain: Secondary | ICD-10-CM | POA: Insufficient documentation

## 2013-12-05 DIAGNOSIS — R109 Unspecified abdominal pain: Secondary | ICD-10-CM | POA: Diagnosis present

## 2013-12-05 LAB — POC URINE PREG, ED: PREG TEST UR: NEGATIVE

## 2013-12-05 LAB — URINALYSIS, ROUTINE W REFLEX MICROSCOPIC
Bilirubin Urine: NEGATIVE
Glucose, UA: NEGATIVE mg/dL
Ketones, ur: NEGATIVE mg/dL
Nitrite: NEGATIVE
PH: 6 (ref 5.0–8.0)
Protein, ur: 30 mg/dL — AB
SPECIFIC GRAVITY, URINE: 1.026 (ref 1.005–1.030)
Urobilinogen, UA: 1 mg/dL (ref 0.0–1.0)

## 2013-12-05 LAB — CBC WITH DIFFERENTIAL/PLATELET
BASOS ABS: 0 10*3/uL (ref 0.0–0.1)
Basophils Relative: 0 % (ref 0–1)
Eosinophils Absolute: 0 10*3/uL (ref 0.0–0.7)
Eosinophils Relative: 0 % (ref 0–5)
HEMATOCRIT: 35.1 % — AB (ref 36.0–46.0)
Hemoglobin: 11.1 g/dL — ABNORMAL LOW (ref 12.0–15.0)
LYMPHS PCT: 33 % (ref 12–46)
Lymphs Abs: 3.5 10*3/uL (ref 0.7–4.0)
MCH: 29.8 pg (ref 26.0–34.0)
MCHC: 31.6 g/dL (ref 30.0–36.0)
MCV: 94.1 fL (ref 78.0–100.0)
Monocytes Absolute: 0.6 10*3/uL (ref 0.1–1.0)
Monocytes Relative: 6 % (ref 3–12)
Neutro Abs: 6.3 10*3/uL (ref 1.7–7.7)
Neutrophils Relative %: 61 % (ref 43–77)
PLATELETS: 377 10*3/uL (ref 150–400)
RBC: 3.73 MIL/uL — ABNORMAL LOW (ref 3.87–5.11)
RDW: 12.9 % (ref 11.5–15.5)
WBC: 10.5 10*3/uL (ref 4.0–10.5)

## 2013-12-05 LAB — I-STAT CHEM 8, ED
BUN: 15 mg/dL (ref 6–23)
CHLORIDE: 108 meq/L (ref 96–112)
Calcium, Ion: 1.04 mmol/L — ABNORMAL LOW (ref 1.12–1.23)
Creatinine, Ser: 1.1 mg/dL (ref 0.50–1.10)
Glucose, Bld: 111 mg/dL — ABNORMAL HIGH (ref 70–99)
HCT: 36 % (ref 36.0–46.0)
Hemoglobin: 12.2 g/dL (ref 12.0–15.0)
Potassium: 3.2 mEq/L — ABNORMAL LOW (ref 3.7–5.3)
SODIUM: 141 meq/L (ref 137–147)
TCO2: 23 mmol/L (ref 0–100)

## 2013-12-05 LAB — URINE MICROSCOPIC-ADD ON

## 2013-12-05 LAB — WET PREP, GENITAL: Yeast Wet Prep HPF POC: NONE SEEN

## 2013-12-05 MED ORDER — ONDANSETRON HCL 4 MG/2ML IJ SOLN
4.0000 mg | Freq: Once | INTRAMUSCULAR | Status: AC
  Administered 2013-12-05: 4 mg via INTRAVENOUS
  Filled 2013-12-05: qty 2

## 2013-12-05 MED ORDER — SODIUM CHLORIDE 0.9 % IV BOLUS (SEPSIS)
1000.0000 mL | Freq: Once | INTRAVENOUS | Status: AC
  Administered 2013-12-05: 1000 mL via INTRAVENOUS

## 2013-12-05 MED ORDER — DOXYCYCLINE HYCLATE 100 MG PO CAPS: 100.0000 mg | ORAL_CAPSULE | Freq: Two times a day (BID) | ORAL | Status: DC

## 2013-12-05 MED ORDER — CEFTRIAXONE SODIUM 250 MG IJ SOLR: 250.0000 mg | Freq: Once | INTRAMUSCULAR | Status: DC

## 2013-12-05 MED ORDER — DEXTROSE 5 % IV SOLN
250.0000 mg | Freq: Once | INTRAVENOUS | Status: AC
  Administered 2013-12-05: 250 mg via INTRAVENOUS
  Filled 2013-12-05: qty 250

## 2013-12-05 MED ORDER — CEPHALEXIN 500 MG PO CAPS: 500.0000 mg | ORAL_CAPSULE | Freq: Two times a day (BID) | ORAL | Status: DC

## 2013-12-05 MED ORDER — HYDROMORPHONE HCL 1 MG/ML IJ SOLN
1.0000 mg | Freq: Once | INTRAMUSCULAR | Status: AC
  Administered 2013-12-05: 1 mg via INTRAVENOUS
  Filled 2013-12-05: qty 1

## 2013-12-05 MED ORDER — OXYCODONE-ACETAMINOPHEN 5-325 MG PO TABS
2.0000 | ORAL_TABLET | Freq: Once | ORAL | Status: AC
  Administered 2013-12-05: 2 via ORAL
  Filled 2013-12-05: qty 2

## 2013-12-05 MED ORDER — DEXTROSE 5 % IV SOLN: 250.0000 mg | Freq: Once | INTRAVENOUS | Status: DC

## 2013-12-05 MED ORDER — OXYCODONE-ACETAMINOPHEN 5-325 MG PO TABS: 1.0000 | ORAL_TABLET | Freq: Four times a day (QID) | ORAL | Status: DC | PRN

## 2013-12-05 MED ORDER — ONDANSETRON HCL 4 MG PO TABS: 4.0000 mg | ORAL_TABLET | Freq: Four times a day (QID) | ORAL | Status: DC

## 2013-12-05 MED ORDER — METRONIDAZOLE 500 MG PO TABS: 500.0000 mg | ORAL_TABLET | Freq: Two times a day (BID) | ORAL | Status: DC

## 2013-12-05 NOTE — Discharge Instructions (Signed)
Take doxycycline and Flagyl as prescribed for pelvic inflammatory disease as well as Keflex for your urinary tract infection. Do not drink alcohol while taking Flagyl as it will make you violently ill vomiting. Recommend Zofran for persistent nausea and Percocet as needed for pain control. Followup with your primary care provider as well as an OB/GYN to ensure resolution of symptoms.  Pelvic Inflammatory Disease Pelvic inflammatory disease (PID) refers to an infection in some or all of the female organs. The infection can be in the uterus, ovaries, fallopian tubes, or the surrounding tissues in the pelvis. PID can cause abdominal or pelvic pain that comes on suddenly (acute pelvic pain). PID is a serious infection because it can lead to lasting (chronic) pelvic pain or the inability to have children (infertile).  CAUSES  The infection is often caused by the normal bacteria found in the vaginal tissues. PID may also be caused by an infection that is spread during sexual contact. PID can also occur following:   The birth of a baby.   A miscarriage.   An abortion.   Major pelvic surgery.   The use of an intrauterine device (IUD).   A sexual assault.  RISK FACTORS Certain factors can put a person at higher risk for PID, such as:  Being younger than 25 years.  Being sexually active at [place] age.  Usingnonbarrier contraception.  Havingmultiple sexual partners.  Having sex with someone who has symptoms of a genital infection.  Using oral contraception. Other times, certain behaviors can increase the possibility of getting PID, such as:  Having sex during your period.  Using a vaginal douche.  Having an intrauterine device (IUD) in place. SYMPTOMS   Abdominal or pelvic pain.   Fever.   Chills.   Abnormal vaginal discharge.  Abnormal uterine bleeding.   Unusual pain shortly after finishing your period. DIAGNOSIS  Your caregiver will choose some of the  following methods to make a diagnosis, such as:   Performinga physical exam and history. A pelvic exam typically reveals a very tender uterus and surrounding pelvis.   Ordering laboratory tests including a pregnancy test, blood tests, and urine test.  Orderingcultures of the vagina and cervix to check for a sexually transmitted infection (STI).  Performing an ultrasound.   Performing a laparoscopic procedure to look inside the pelvis.  TREATMENT   Antibiotic medicines may be prescribed and taken by mouth.   Sexual partners may be treated when the infection is caused by a sexually transmitted disease (STD).   Hospitalization may be needed to give antibiotics intravenously.  Surgery may be needed, but this is rare. It may take weeks until you are completely well. If you are diagnosed with PID, you should also be checked for human immunodeficiency virus (HIV). HOME CARE INSTRUCTIONS   If given, take your antibiotics as directed. Finish the medicine even if you start to feel better.   Only take over-the-counter or prescription medicines for pain, discomfort, or fever as directed by your caregiver.   Do not have sexual intercourse until treatment is completed or as directed by your caregiver. If PID is confirmed, your recent sexual partner(s) will need treatment.   Keep your follow-up appointments. SEEK MEDICAL CARE IF:   You have increased or abnormal vaginal discharge.   You need prescription medicine for your pain.   You vomit.   You cannot take your medicines.   Your partner has an STD.  SEEK IMMEDIATE MEDICAL CARE IF:   You have a  fever.   You have increased abdominal or pelvic pain.   You have chills.   You have pain when you urinate.   You are not better after 72 hours following treatment.  MAKE SURE YOU:   Understand these instructions.  Will watch your condition.  Will get help right away if you are not doing well or get  worse. Document Released: 02/12/2005 Document Revised: 06/09/2012 Document Reviewed: 02/08/2011 [facility] Patient Information 2015 [place], [place]. This information is not intended to replace advice given to you by your health care provider. Make sure you discuss any questions you have with your health care provider.  Urinary Tract Infection Urinary tract infections (UTIs) can develop anywhere along your urinary tract. Your urinary tract is your body's drainage system for removing wastes and extra water. Your urinary tract includes two kidneys, two ureters, a bladder, and a urethra. Your kidneys are a pair of bean-shaped organs. Each kidney is about the size of your fist. They are located below your ribs, one on each side of your spine. CAUSES Infections are caused by microbes, which are microscopic organisms, including fungi, viruses, and bacteria. These organisms are so small that they can only be seen through a microscope. Bacteria are the microbes that most commonly cause UTIs. SYMPTOMS  Symptoms of UTIs may vary by age and gender of the patient and by the location of the infection. Symptoms in young women typically include a frequent and intense urge to urinate and a painful, burning feeling in the bladder or urethra during urination. Older women and men are more likely to be tired, shaky, and weak and have muscle aches and abdominal pain. A fever may mean the infection is in your kidneys. Other symptoms of a kidney infection include pain in your back or sides below the ribs, nausea, and vomiting. DIAGNOSIS To diagnose a UTI, your caregiver will ask you about your symptoms. Your caregiver also will ask to provide a urine sample. The urine sample will be tested for bacteria and white blood cells. White blood cells are made by your body to help fight infection. TREATMENT  Typically, UTIs can be treated with medication. Because most UTIs are caused by a bacterial infection, they usually can be treated  with the use of antibiotics. The choice of antibiotic and length of treatment depend on your symptoms and the type of bacteria causing your infection. HOME CARE INSTRUCTIONS  If you were prescribed antibiotics, take them exactly as your caregiver instructs you. Finish the medication even if you feel better after you have only taken some of the medication.  Drink enough water and fluids to keep your urine clear or pale yellow.  Avoid caffeine, tea, and carbonated beverages. They tend to irritate your bladder.  Empty your bladder often. Avoid holding urine for long periods of time.  Empty your bladder before and after sexual intercourse.  After a bowel movement, women should cleanse from front to back. Use each tissue only once. SEEK MEDICAL CARE IF:   You have back pain.  You develop a fever.  Your symptoms do not begin to resolve within 3 days. SEEK IMMEDIATE MEDICAL CARE IF:   You have severe back pain or lower abdominal pain.  You develop chills.  You have nausea or vomiting.  You have continued burning or discomfort with urination. MAKE SURE YOU:   Understand these instructions.  Will watch your condition.  Will get help right away if you are not doing well or get worse. Document Released:  11/22/2004 Document Revised: 08/14/2011 Document Reviewed: 03/23/2011 [employer] Patient Information 2015 [place], [place]. This information is not intended to replace advice given to you by your health care provider. Make sure you discuss any questions you have with your health care provider.

## 2013-12-05 NOTE — ED Provider Notes (Signed)
CSN: [id number]     Arrival date & time 12/05/13  0113 History   First MD Initiated Contact with Patient 12/05/13 [phone]     Chief Complaint  Patient presents with  . Abdominal Pain  . Emesis    (Consider location/radiation/quality/duration/timing/severity/associated sxs/prior Treatment)  Patient is a 28 y.o. female presenting with abdominal pain. The history is provided by the patient. No language interpreter was used.  Abdominal Pain Pain location:  Suprapubic Pain radiates to:  Does not radiate Onset quality:  Gradual Duration:  2 weeks Timing:  Constant Progression:  Worsening Chronicity:  New Context: not recent sexual activity (denies sexual intercourse in the last 9 months), not sick contacts and not suspicious food intake   Relieved by:  Nothing Ineffective treatments: Keflex, prescribed for UTI. Associated symptoms: nausea and vomiting   Associated symptoms: no chest pain, no diarrhea, no dysuria, no fever, no hematemesis, no hematochezia, no hematuria, no melena, no shortness of breath and no vaginal bleeding   Risk factors: obesity     Past Medical History  Diagnosis Date  . 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 Brain tumor   . Schizoaffective disorder   . Anxiety   . Depression   . Seizures   . Kidney stones   . Kidney stone   . Cancer 1991    ovarian   Past Surgical History  Procedure Laterality Date  . Abdominal hysterectomy      partial  . Kidney stone removal    . Appendectomy     Family History  Problem Relation Age of Onset  . Diabetes type II Other   . Asthma Mother    History  Substance Use Topics  . Smoking status: Former Smoker -- 0.25 packs/day for 2 years    Types: Cigarettes    Quit date: 07/27/2012  . Smokeless tobacco: Never Used  . Alcohol Use: No   OB History   Grav Para Term Preterm Abortions TAB SAB Ect Mult Living                  Review of Systems  Constitutional: Negative for fever.  Respiratory:  Negative for shortness of breath.   Cardiovascular: Negative for chest pain.  Gastrointestinal: Positive for nausea, vomiting and abdominal pain. Negative for diarrhea, melena, hematochezia and hematemesis.  Genitourinary: Negative for dysuria, hematuria and vaginal bleeding.  All other systems reviewed and are negative.   Allergies  Ketorolac tromethamine; Lortab; and Tramadol  Home Medications   Prior to Admission medications   Medication Sig Start Date End Date Taking? Authorizing Provider  albuterol (ACCUNEB) 1.25 MG/3ML nebulizer solution Take 1 ampule by nebulization 3 (three) times daily.   Yes Historical Provider, MD  albuterol (PROAIR HFA) 108 (90 BASE) MCG/ACT inhaler Inhale 2 puffs into the lungs every 6 (six) hours as needed for wheezing or shortness of breath.   Yes Historical Provider, MD  Camphor-Eucalyptus-Menthol (MEDICATED CHEST RUB EX) Apply topically. Rubs on chest every night   Yes Historical Provider, MD  famotidine (PEPCID) 20 MG tablet One at bedtime 12/04/13  Yes Tanda Rockers, MD  montelukast (SINGULAIR) 10 MG tablet Take 10 mg by mouth at bedtime.   Yes Historical Provider, MD  nebivolol (BYSTOLIC) 10 MG tablet Take 1 tablet (10 mg total) by mouth daily. 10/09/13  Yes Tanda Rockers, MD  pantoprazole (PROTONIX) 40 MG tablet Take 1 tablet (40 mg total) by mouth daily. 11/20/13  Yes Tanda Rockers, MD  Phenyleph-Doxylamine-DM-APAP (ALKA SELTZER PLUS PO) Take 2 tablets by mouth every 4 (four) hours as needed (cold symptoms).   Yes Historical Provider, MD  predniSONE (DELTASONE) 10 MG tablet Take  4 each am x 2 days,   2 each am x 2 days,  1 each am x 2 days and stop 12/04/13  Yes Tanda Rockers, MD  promethazine (PHENERGAN) 25 MG tablet Take 25 mg by mouth every 6 (six) hours as needed for nausea or vomiting.   Yes Historical Provider, MD  cephALEXin (KEFLEX) 500 MG capsule Take 1 capsule (500 mg total) by mouth 2 (two) times daily. 12/05/13   Antonietta Breach, PA-C   doxycycline (VIBRAMYCIN) 100 MG capsule Take 1 capsule (100 mg total) by mouth 2 (two) times daily. 12/05/13   Antonietta Breach, PA-C  metroNIDAZOLE (FLAGYL) 500 MG tablet Take 1 tablet (500 mg total) by mouth 2 (two) times daily. 12/05/13   Antonietta Breach, PA-C  ondansetron (ZOFRAN) 4 MG tablet Take 1 tablet (4 mg total) by mouth every 6 (six) hours. 12/05/13   Antonietta Breach, PA-C  oxyCODONE-acetaminophen (PERCOCET/ROXICET) 5-325 MG per tablet Take 1-2 tablets by mouth every 6 (six) hours as needed for moderate pain or severe pain. 12/05/13   Antonietta Breach, PA-C  Respiratory Therapy Supplies (FLUTTER) DEVI Use as directed 12/04/13   Tanda Rockers, MD   BP 125/80  Pulse 93  Temp(Src) 98.1 F (36.7 C) (Oral)  Resp 18  SpO2 95%  Physical Exam  Nursing note and vitals reviewed. Constitutional: She is oriented to person, place, and time. She appears well-developed and well-nourished. No distress.  Nontoxic/nonseptic appearing. Patient is a morbidly obese female.  HENT:  Head: Normocephalic and atraumatic.  Eyes: Conjunctivae and EOM are normal. No scleral icterus.  Neck: Normal range of motion.  Cardiovascular: Regular rhythm and normal heart sounds.  Tachycardia present.   Mild tachycardia  Pulmonary/Chest: Effort normal. No respiratory distress. She has no wheezes. She has no rales.  Lungs clear bilaterally. Chest expansion symmetric.  Abdominal: Soft. She exhibits no distension. There is tenderness. There is no rebound and no guarding.  TTP in suprapubic region without peritoneal signs or involuntary guarding.  Genitourinary: There is no rash, tenderness, lesion or injury on the right labia. There is no rash, tenderness, lesion or injury on the left labia. Uterus is tender. Cervix exhibits no motion tenderness. Vaginal discharge found.  Cervical dysplasia without CMT. Scant white discharge in vaginal vault. There is tenderness on palpation of the uterus. Ovaries absent; hx partial hysterectomy.   Musculoskeletal: Normal range of motion.  Neurological: She is alert and oriented to person, place, and time. She exhibits normal muscle tone. Coordination normal.  GCS 15. Speech is goal oriented. Patient moves extremities without ataxia.  Skin: Skin is warm and dry. No rash noted. She is not diaphoretic. No erythema. No pallor.  Psychiatric: She has a normal mood and affect. Her behavior is normal.    ED Course  Procedures (including critical care time) Labs Review Labs Reviewed  WET PREP, GENITAL - Abnormal; Notable for the following:    Trich, Wet Prep MANY (*)    Clue Cells Wet Prep HPF POC MANY (*)    WBC, Wet Prep HPF POC MANY (*)    All other components within normal limits  CBC WITH DIFFERENTIAL - Abnormal; Notable for the following:    RBC 3.73 (*)    Hemoglobin 11.1 (*)    HCT 35.1 (*)    All other  components within normal limits  URINALYSIS, ROUTINE W REFLEX MICROSCOPIC - Abnormal; Notable for the following:    APPearance TURBID (*)    Hgb urine dipstick TRACE (*)    Protein, ur 30 (*)    Leukocytes, UA LARGE (*)    All other components within normal limits  URINE MICROSCOPIC-ADD ON - Abnormal; Notable for the following:    Squamous Epithelial / LPF MANY (*)    Bacteria, UA MANY (*)    Casts GRANULAR CAST (*)    Crystals CA OXALATE CRYSTALS (*)    All other components within normal limits  I-STAT CHEM 8, ED - Abnormal; Notable for the following:    Potassium 3.2 (*)    Glucose, Bld 111 (*)    Calcium, Ion 1.04 (*)    All other components within normal limits  GC/CHLAMYDIA PROBE AMP  URINE CULTURE  POC URINE PREG, ED   Imaging Review No results found.   EKG Interpretation None      MDM   Final diagnoses:  PID (pelvic inflammatory disease)  UTI (lower urinary tract infection)    Patient is a 28 y/o female who presents for persistent abdominal pain with associated N/V. Symptoms ongoing x 2 weeks. Patient seen for symptoms at onset and given Keflex  to take for UTI. She has had no improvement in symptoms with this regimen.  Patient is nontoxic/nonseptic appearing. She has TTP in her suprapubic region without peritoneal signs or guarding. No evidence of acute surgical abdomen. Patient also noted to have no leukocytosis today. H/H stable. UA noted to have many WBCs and bacteria. Leukocytes negative. Trichomonas present. Wet prep also noted to have trichomonas as well as clue cells.  Given trichomonal infection with persistent suprapubic abdominal pain; will tx for PID. Patient tx in ED with Rocephin. She will be discharged with doxycycline and Flagyl. Keflex given should patient have persistent UTI; urine sent for culture. Percocet prescribed for pain control and zofran given for nausea. Patient has been able to tolerate POs in ED without emesis. She is stable for d/c at this time with instruction to f/u with an OBGYN. Return precautions provided and patient agreeable to plan with no unaddressed concerns.   Filed Vitals:   12/05/13 0121 12/05/13 0354 12/05/13 0536 12/05/13 0632  BP: 129/74 155/90 132/80 125/80  Pulse: 105 76 87 93  Temp: 98.2 F (36.8 C) 98.1 F (36.7 C)    TempSrc: Oral Oral    Resp: 18 18  18   SpO2: 96% 100% 90% 95%     Antonietta Breach, PA-C 12/11/13 1707

## 2013-12-05 NOTE — ED Notes (Signed)
Pt presents from home, c/o of n/v, abdominal pain 10/10, recently seen for the same, states no improvement for the last 2 weeks.

## 2013-12-05 NOTE — Assessment & Plan Note (Signed)
-   trial off acei    09/03/2013 >  The most common causes of chronic cough in immunocompetent adults include the following: upper airway cough syndrome (UACS), previously referred to as postnasal drip syndrome (PNDS), which is caused by variety of rhinosinus conditions; (2) asthma; (3) GERD; (4) chronic bronchitis from cigarette smoking or other inhaled environmental irritants; (5) nonasthmatic eosinophilic bronchitis; and (6) bronchiectasis.   These conditions, singly or in combination, have accounted for up to 94% of the causes of chronic cough in prospective studies.   Other conditions have constituted no >6% of the causes in prospective studies These have included bronchogenic carcinoma, chronic interstitial pneumonia, sarcoidosis, left ventricular failure, ACEI-induced cough, and aspiration from a condition associated with pharyngeal dysfunction.    Chronic cough is often simultaneously caused by more than one condition. A single cause has been found from 38 to 82% of the time, multiple causes from 18 to 62%. Multiply caused cough has been the result of three diseases up to 42% of the time.       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   These are the same pts (now being labeled as having "irritable larynx syndrome" by some cough centers) who not infrequently have a history of having failed to tolerate ace inhibitors,  dry powder inhalers or biphosphonates or report having atypical reflux symptoms that don't respond to standard doses of  PPI , and are easily confused as having aecopd or asthma flares by even experienced allergists/ pulmonologists.   The first step is to maximize acid suppression and eliminate cyclical coughing with delsym/flutter then regroup in 1 week to do full med reconciliation  I had an extended discussion with the patient today lasting 15 to 20 minutes of a 25 minute visit on the following issues: 1) no longer willing to offer narcotics for cough if she can't prove to me she's taking the meds we've already recommended 2) willing to see her only in a trust but verify mode, which we were not able to accomplish today as she did not bring all meds as requested 3) no evidence of asthma here so stop breo for now as her problem is cough, not wheeze.  4) instructed on approp use of flutter and candy to prevent throat clearing/traumatic cough from developing in the first place

## 2013-12-05 NOTE — ED Notes (Signed)
Bed: WA17 Expected date:  Expected time:  Means of arrival:  Comments: EMS, 15 F, n/v/abd pain

## 2013-12-06 LAB — URINE CULTURE
Colony Count: NO GROWTH
Culture: NO GROWTH

## 2013-12-06 NOTE — ED Provider Notes (Signed)
Medical screening examination/treatment/procedure(s) were performed by non-physician practitioner and as supervising physician I was immediately available for consultation/collaboration.   EKG Interpretation None       Jurni Cesaro K Makaiyah Schweiger-Rasch, MD 12/06/13 [phone]

## 2013-12-07 LAB — GC/CHLAMYDIA PROBE AMP
CT PROBE, AMP APTIMA: NEGATIVE
GC Probe RNA: NEGATIVE

## 2013-12-14 NOTE — ED Provider Notes (Signed)
Medical screening examination/treatment/procedure(s) were performed by non-physician practitioner and as supervising physician I was immediately available for consultation/collaboration.   EKG Interpretation None       [name] K Adia Crammer-Rasch, MD 12/14/13 2302

## 2013-12-17 ENCOUNTER — Ambulatory Visit: Payer: Medicaid Other | Admitting: Pulmonary Disease

## 2013-12-18 ENCOUNTER — Other Ambulatory Visit: Payer: Self-pay | Admitting: Internal Medicine

## 2013-12-22 ENCOUNTER — Other Ambulatory Visit: Payer: Self-pay | Admitting: Internal Medicine

## 2013-12-22 DIAGNOSIS — R05 Cough: Secondary | ICD-10-CM

## 2013-12-22 DIAGNOSIS — R059 Cough, unspecified: Secondary | ICD-10-CM

## 2013-12-23 ENCOUNTER — Ambulatory Visit: Payer: Medicaid Other | Admitting: Internal Medicine

## 2013-12-29 ENCOUNTER — Encounter (HOSPITAL_COMMUNITY): Payer: Self-pay | Admitting: *Deleted

## 2013-12-29 DIAGNOSIS — Z8669 Personal history of other diseases of the nervous system and sense organs: Secondary | ICD-10-CM | POA: Insufficient documentation

## 2013-12-29 DIAGNOSIS — Z86011 Personal history of benign neoplasm of the brain: Secondary | ICD-10-CM | POA: Diagnosis not present

## 2013-12-29 DIAGNOSIS — Z79899 Other long term (current) drug therapy: Secondary | ICD-10-CM | POA: Diagnosis not present

## 2013-12-29 DIAGNOSIS — R509 Fever, unspecified: Secondary | ICD-10-CM | POA: Insufficient documentation

## 2013-12-29 DIAGNOSIS — R102 Pelvic and perineal pain: Secondary | ICD-10-CM | POA: Diagnosis present

## 2013-12-29 DIAGNOSIS — Z792 Long term (current) use of antibiotics: Secondary | ICD-10-CM | POA: Insufficient documentation

## 2013-12-29 DIAGNOSIS — A5901 Trichomonal vulvovaginitis: Secondary | ICD-10-CM | POA: Insufficient documentation

## 2013-12-29 DIAGNOSIS — E669 Obesity, unspecified: Secondary | ICD-10-CM | POA: Insufficient documentation

## 2013-12-29 DIAGNOSIS — Z87442 Personal history of urinary calculi: Secondary | ICD-10-CM | POA: Insufficient documentation

## 2013-12-29 DIAGNOSIS — Z87891 Personal history of nicotine dependence: Secondary | ICD-10-CM | POA: Diagnosis not present

## 2013-12-29 DIAGNOSIS — Z3202 Encounter for pregnancy test, result negative: Secondary | ICD-10-CM | POA: Diagnosis not present

## 2013-12-29 DIAGNOSIS — Z8659 Personal history of other mental and behavioral disorders: Secondary | ICD-10-CM | POA: Insufficient documentation

## 2013-12-29 DIAGNOSIS — Z8543 Personal history of malignant neoplasm of ovary: Secondary | ICD-10-CM | POA: Insufficient documentation

## 2013-12-29 MED ORDER — OXYCODONE-ACETAMINOPHEN 5-325 MG PO TABS
1.0000 | ORAL_TABLET | Freq: Once | ORAL | Status: AC
  Administered 2013-12-29: 1 via ORAL
  Filled 2013-12-29: qty 1

## 2013-12-29 MED ORDER — ONDANSETRON 4 MG PO TBDP
8.0000 mg | ORAL_TABLET | Freq: Once | ORAL | Status: AC
  Administered 2013-12-29: 8 mg via ORAL
  Filled 2013-12-29: qty 2

## 2013-12-29 NOTE — ED Notes (Signed)
Pt in c/o pelvic pain for the last two days, recently dx with PID at [facility] long, reports increased pain in the last few days, no distress noted

## 2013-12-30 ENCOUNTER — Emergency Department (HOSPITAL_COMMUNITY)
Admission: EM | Admit: 2013-12-30 | Discharge: 2013-12-30 | Disposition: A | Discharge status: Home / Self Care | Payer: Medicaid Other | Attending: Emergency Medicine | Admitting: Emergency Medicine

## 2013-12-30 DIAGNOSIS — A5901 Trichomonal vulvovaginitis: Secondary | ICD-10-CM

## 2013-12-30 LAB — PREGNANCY, URINE: PREG TEST UR: NEGATIVE

## 2013-12-30 LAB — URINALYSIS, ROUTINE W REFLEX MICROSCOPIC
Bilirubin Urine: NEGATIVE
Glucose, UA: NEGATIVE mg/dL
Ketones, ur: 15 mg/dL — AB
NITRITE: NEGATIVE
PH: 6 (ref 5.0–8.0)
Protein, ur: NEGATIVE mg/dL
SPECIFIC GRAVITY, URINE: 1.033 — AB (ref 1.005–1.030)
Urobilinogen, UA: 1 mg/dL (ref 0.0–1.0)

## 2013-12-30 LAB — URINE MICROSCOPIC-ADD ON

## 2013-12-30 LAB — WET PREP, GENITAL: Yeast Wet Prep HPF POC: NONE SEEN

## 2013-12-30 MED ORDER — METRONIDAZOLE 500 MG PO TABS
2000.0000 mg | ORAL_TABLET | Freq: Once | ORAL | Status: AC
  Administered 2013-12-30: 2000 mg via ORAL
  Filled 2013-12-30: qty 4

## 2013-12-30 MED ORDER — OXYCODONE-ACETAMINOPHEN 5-325 MG PO TABS: 1.0000 | ORAL_TABLET | Freq: Once | ORAL | Status: DC

## 2013-12-30 MED ORDER — OXYCODONE-ACETAMINOPHEN 5-325 MG PO TABS: 1.0000 | ORAL_TABLET | Freq: Two times a day (BID) | ORAL | Status: DC | PRN

## 2013-12-30 MED ORDER — OXYCODONE-ACETAMINOPHEN 5-325 MG PO TABS
2.0000 | ORAL_TABLET | Freq: Once | ORAL | Status: AC
  Administered 2013-12-30: 2 via ORAL
  Filled 2013-12-30: qty 2

## 2013-12-30 NOTE — ED Provider Notes (Signed)
CSN: [id number]     Arrival date & time 12/29/13  2203 History   First MD Initiated Contact with Patient 12/30/13 0122     Chief Complaint  Patient presents with  . Pelvic Pain     (Consider location/radiation/quality/duration/timing/severity/associated sxs/prior Treatment) HPI  Samah L Difiore is a 28 y.o. female with past medical history of anxiety, nephrolithiasis, ovarian cancer status post partial hysterectomy, here with pelvic pain. Patient states she has recurrent PID and is scheduled to see OB/GYN on November 18 for possible evaluation for total hysterectomy. She was seen at [facility] long 2 weeks ago and treated with pain medications and antibiotics. She is currently requesting pain medication now. She states she's had bilateral lower quadrant pain that feels like labor pain. Radiates to her back bilaterally. She takes Percocet 10 mg at home for relief. She's had subjective fevers as well. Patient is denying dysuria or hematuria. She's had no abnormal discharge. She states she completed her antibiotics from her [facility] long visit and now her symptoms have recurred. She denies any unprotected sex during the interval. Patient has no further complaints.  10 Systems reviewed and are negative for acute change except as noted in the HPI.     Past Medical History  Diagnosis Date  . 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 Brain tumor   . Schizoaffective disorder   . Anxiety   . Depression   . Seizures   . Kidney stones   . Kidney stone   . Cancer 1991    ovarian   Past Surgical History  Procedure Laterality Date  . Abdominal hysterectomy      partial  . Kidney stone removal    . Appendectomy     Family History  Problem Relation Age of Onset  . Diabetes type II Other   . Asthma Mother    History  Substance Use Topics  . Smoking status: Former Smoker -- 0.25 packs/day for 2 years    Types: Cigarettes    Quit date: 07/27/2012  . Smokeless tobacco: Never Used  .  Alcohol Use: No   OB History    No data available     Review of Systems    Allergies  Ketorolac tromethamine; Lortab; and Tramadol  Home Medications   Prior to Admission medications   Medication Sig Start Date End Date Taking? Authorizing Provider  albuterol (ACCUNEB) 1.25 MG/3ML nebulizer solution Take 1 ampule by nebulization 3 (three) times daily.   Yes Historical Provider, MD  albuterol (PROAIR HFA) 108 (90 BASE) MCG/ACT inhaler Inhale 2 puffs into the lungs every 6 (six) hours as needed for wheezing or shortness of breath.   Yes Historical Provider, MD  Camphor-Eucalyptus-Menthol (MEDICATED CHEST RUB EX) Apply 1 application topically at bedtime. Rubs on chest every night   Yes Historical Provider, MD  famotidine (PEPCID) 20 MG tablet One at bedtime 12/04/13  Yes Tanda Rockers, MD  montelukast (SINGULAIR) 10 MG tablet Take 10 mg by mouth at bedtime.   Yes Historical Provider, MD  nebivolol (BYSTOLIC) 10 MG tablet Take 1 tablet (10 mg total) by mouth daily. 10/09/13  Yes Tanda Rockers, MD  ondansetron (ZOFRAN) 4 MG tablet Take 1 tablet (4 mg total) by mouth every 6 (six) hours. Patient taking differently: Take 4 mg by mouth every 8 (eight) hours as needed for nausea.  12/05/13  Yes Antonietta Breach, PA-C  oxyCODONE-acetaminophen (PERCOCET) 10-325 MG per tablet Take 1 tablet by mouth every 6 (six)  hours as needed for pain.   Yes Historical Provider, MD  pantoprazole (PROTONIX) 40 MG tablet Take 1 tablet (40 mg total) by mouth daily. 11/20/13  Yes Tanda Rockers, MD  Phenyleph-Doxylamine-DM-APAP (ALKA SELTZER PLUS PO) Take 2 tablets by mouth every 4 (four) hours as needed (cold symptoms).   Yes Historical Provider, MD  promethazine (PHENERGAN) 25 MG tablet Take 25 mg by mouth every 6 (six) hours as needed for nausea or vomiting.   Yes Historical Provider, MD  cephALEXin (KEFLEX) 500 MG capsule Take 1 capsule (500 mg total) by mouth 2 (two) times daily. 12/05/13   Antonietta Breach, PA-C   doxycycline (VIBRAMYCIN) 100 MG capsule Take 1 capsule (100 mg total) by mouth 2 (two) times daily. 12/05/13   Antonietta Breach, PA-C  metroNIDAZOLE (FLAGYL) 500 MG tablet Take 1 tablet (500 mg total) by mouth 2 (two) times daily. 12/05/13   Antonietta Breach, PA-C  oxyCODONE-acetaminophen (PERCOCET/ROXICET) 5-325 MG per tablet Take 1-2 tablets by mouth every 6 (six) hours as needed for moderate pain or severe pain. 12/05/13   Antonietta Breach, PA-C  predniSONE (DELTASONE) 10 MG tablet Take  4 each am x 2 days,   2 each am x 2 days,  1 each am x 2 days and stop 12/04/13   Tanda Rockers, MD  Respiratory Therapy Supplies (FLUTTER) DEVI Use as directed 12/04/13   Tanda Rockers, MD   BP 143/78 mmHg  Pulse 78  Temp(Src) 98.2 F (36.8 C) (Oral)  Resp 17  Ht 5\' 5"  (1.651 m)  Wt 304 lb (137.893 kg)  BMI 50.59 kg/m2  SpO2 97% Physical Exam  Constitutional: She is oriented to person, place, and time. She appears well-developed and well-nourished. No distress.  Obese female  HENT:  Head: Normocephalic and atraumatic.  Nose: Nose normal.  Mouth/Throat: Oropharynx is clear and moist. No oropharyngeal exudate.  Eyes: Conjunctivae and EOM are normal. Pupils are equal, round, and reactive to light. No scleral icterus.  Neck: Normal range of motion. Neck supple. No JVD present. No tracheal deviation present. No thyromegaly present.  Cardiovascular: Normal rate, regular rhythm and normal heart sounds.  Exam reveals no gallop and no friction rub.   No murmur heard. Pulmonary/Chest: Effort normal and breath sounds normal. No respiratory distress. She has no wheezes. She exhibits no tenderness.  Abdominal: Soft. Bowel sounds are normal. She exhibits no distension and no mass. There is no tenderness. There is no rebound and no guarding.  Genitourinary: Vagina normal. No vaginal discharge found.  Malodorous. No CMT or adnexal tenderness.  Musculoskeletal: Normal range of motion. She exhibits no edema or tenderness.   Lymphadenopathy:    She has no cervical adenopathy.  Neurological: She is alert and oriented to person, place, and time. No cranial nerve deficit. She exhibits normal muscle tone.  Skin: Skin is warm and dry. No rash noted. She is not diaphoretic. No erythema. No pallor.  Nursing note and vitals reviewed.   ED Course  Procedures (including critical care time) Labs Review Labs Reviewed  WET PREP, GENITAL - Abnormal; Notable for the following:    Trich, Wet Prep MODERATE (*)    Clue Cells Wet Prep HPF POC FEW (*)    WBC, Wet Prep HPF POC FEW (*)    All other components within normal limits  URINALYSIS, ROUTINE W REFLEX MICROSCOPIC - Abnormal; Notable for the following:    APPearance CLOUDY (*)    Specific Gravity, Urine 1.033 (*)    Hgb  urine dipstick SMALL (*)    Ketones, ur 15 (*)    Leukocytes, UA MODERATE (*)    All other components within normal limits  URINE MICROSCOPIC-ADD ON - Abnormal; Notable for the following:    Squamous Epithelial / LPF FEW (*)    Bacteria, UA FEW (*)    Crystals CA OXALATE CRYSTALS (*)    All other components within normal limits  GC/CHLAMYDIA PROBE AMP  PREGNANCY, URINE    Imaging Review No results found.   EKG Interpretation None      MDM   Final diagnoses:  None    Patient presents emergency department for recurrent pelvic pain. Her physical exam was unremarkable and her abdomen was soft. She stated she was in pain during palpation but this was easily distractible and not reproducible in the same areas. Will evaluate further with pelvic exam. She is requesting IV along with pain medication. She states she is allergic to Toradol, tramadol, Lortab. She was given her home Percocet dose.  Urinalysis was not overwhelming for infection, patient was recently treated for PID and a UTI. Pelvic exam did reveal Trichomonas will give Flagyl here. Patient will be discharged with follow-up with her [facility] GYN physician for possible surgical repair. Her  vital signs remain within her normal limits and she is safe for discharge.   [name], MD 12/30/13 [phone]

## 2013-12-30 NOTE — Discharge Instructions (Signed)
Trichomoniasis [name], you were seen for pelvic pain. Your urine did not reveal a significant infection, your pelvic exam was unremarkable. You were treated for Trichomonas. Will up with your OB/GYN surgeon and get an earlier appointment for possible surgery. If any of her symptoms worsen come back to emergency department for repeat evaluation. Thank you. Trichomoniasis is an infection caused by an organism called Trichomonas. The infection can affect both women and men. In women, the outer female genitalia and the vagina are affected. In men, the penis is mainly affected, but the prostate and other reproductive organs can also be involved. Trichomoniasis is a sexually transmitted infection (STI) and is most often passed to another person through sexual contact.  RISK FACTORS  Having unprotected sexual intercourse.  Having sexual intercourse with an infected partner. SIGNS AND SYMPTOMS  Symptoms of trichomoniasis in women include:  Abnormal gray-green frothy vaginal discharge.  Itching and irritation of the vagina.  Itching and irritation of the area outside the vagina. Symptoms of trichomoniasis in men include:   Penile discharge with or without pain.  Pain during urination. This results from inflammation of the urethra. DIAGNOSIS  Trichomoniasis may be found during a Pap test or physical exam. Your health care provider may use one of the following methods to help diagnose this infection:  Examining vaginal discharge under a microscope. For men, urethral discharge would be examined.  Testing the pH of the vagina with a test tape.  Using a vaginal swab test that checks for the Trichomonas organism. A test is available that provides results within a few minutes.  Doing a culture test for the organism. This is not usually needed. TREATMENT   You may be given medicine to fight the infection. Women should inform their health care provider if they could be or are pregnant. Some  medicines used to treat the infection should not be taken during pregnancy.  Your health care provider may recommend over-the-counter medicines or creams to decrease itching or irritation.  Your sexual partner will need to be treated if infected. HOME CARE INSTRUCTIONS   Take medicines only as directed by your health care provider.  Take over-the-counter medicine for itching or irritation as directed by your health care provider.  Do not have sexual intercourse while you have the infection.  Women should not douche or wear tampons while they have the infection.  Discuss your infection with your partner. Your partner may have gotten the infection from you, or you may have gotten it from your partner.  Have your sex partner get examined and treated if necessary.  Practice safe, informed, and protected sex.  See your health care provider for other STI testing. SEEK MEDICAL CARE IF:   You still have symptoms after you finish your medicine.  You develop abdominal pain.  You have pain when you urinate.  You have bleeding after sexual intercourse.  You develop a rash.  Your medicine makes you sick or makes you throw up (vomit). MAKE SURE YOU:  Understand these instructions.  Will watch your condition.  Will get help right away if you are not doing well or get worse. Document Released: 08/08/2000 Document Revised: 06/29/2013 Document Reviewed: 11/24/2012 [facility] Patient Information 2015 [place], [place]. This information is not intended to replace advice given to you by your health care provider. Make sure you discuss any questions you have with your health care provider.

## 2013-12-30 NOTE — ED Notes (Signed)
MD at bedside. 

## 2013-12-31 LAB — GC/CHLAMYDIA PROBE AMP
CT Probe RNA: NEGATIVE
GC PROBE AMP APTIMA: NEGATIVE

## 2014-01-06 ENCOUNTER — Ambulatory Visit: Payer: Medicaid Other | Admitting: Internal Medicine

## 2014-01-12 ENCOUNTER — Ambulatory Visit: Payer: Medicaid Other | Admitting: Pulmonary Disease

## 2014-01-13 ENCOUNTER — Encounter: Payer: Self-pay | Admitting: Obstetrics and Gynecology

## 2014-01-13 ENCOUNTER — Telehealth: Payer: Self-pay | Admitting: *Deleted

## 2014-01-13 ENCOUNTER — Ambulatory Visit (INDEPENDENT_AMBULATORY_CARE_PROVIDER_SITE_OTHER): Payer: Medicaid Other | Admitting: Obstetrics and Gynecology

## 2014-01-13 VITALS — BP 164/98 | HR 86 | Temp 99.0°F | Ht 65.0 in | Wt 300.3 lb

## 2014-01-13 DIAGNOSIS — R102 Pelvic and perineal pain: Secondary | ICD-10-CM

## 2014-01-13 MED ORDER — OXYCODONE-ACETAMINOPHEN 5-325 MG PO TABS: 1.0000 | ORAL_TABLET | ORAL | Status: DC | PRN

## 2014-01-13 NOTE — Progress Notes (Signed)
Patient ID: [name], female   DOB: 1985-08-13, 28 y.o.   MRN: [id number] 28 yo G0 here for ED follow up. Patient reports onset of Levar Fayson lower abdominal pain since September 2015. Patient states that the pain is consistent with contraction pains lasting 40 minutes daily. There are no aggravating factors but pain medication seems to help with her pain. She reports some constipation and pain relief following bowel movement. She denies dysuria, frequency or urgency. She has not been sexually active since January 2015. Patient reports having bilateral oophorectomy at  28 years old secondary to an ovarian cancer.  Past Medical History  Diagnosis Date  . 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 Brain tumor   . Schizoaffective disorder   . Anxiety   . Depression   . Seizures   . Kidney stones   . Kidney stone   . Cancer 1991    ovarian  . Ovarian cancer    Past Surgical History  Procedure Laterality Date  . Kidney stone removal    . Appendectomy    . Abdominal hysterectomy      partial; took fallopian tubes and ovaries   Family History  Problem Relation Age of Onset  . Diabetes type II Other   . Asthma Mother    History  Substance Use Topics  . Smoking status: Former Smoker -- 0.25 packs/day for 2 years    Types: Cigarettes    Quit date: 07/27/2012  . Smokeless tobacco: Never Used  . Alcohol Use: No   GENERAL: Well-developed, well-nourished female in no acute distress. Obese ABDOMEN: Soft,diffuse abdominal tenderness, nondistended, no rebound, no guarding. No organomegaly. PELVIC: Normal external female genitalia. Vagina is pink and rugated.  Normal appearing cervix. Uterus is normal in size. No adnexal mass or tenderness. Bimanual exam limited secondary to body habitus EXTREMITIES: No cyanosis, clubbing, or edema, 2+ distal pulses.  A/P 28 yo with abdominal pain - Will obtain pelvic ultrasound to rule out a GYN etiology of her pain - Discussed with patient  that pain could be related to constipation and to avoid narcotics - Rx 20 tabs percocet provided. Patient was informed that no further narcotics will be prescribed if there is no obvious GYN cause for her pain and that she should follow up with her PCP as planned in December - Patient will be contacted with any abnormal results

## 2014-01-13 NOTE — Telephone Encounter (Signed)
Contacted patient to come in for appointment early. Pt states she is on the bus currently and will be here shortly.

## 2014-01-14 ENCOUNTER — Ambulatory Visit (HOSPITAL_COMMUNITY): Payer: Medicaid Other

## 2014-02-03 ENCOUNTER — Ambulatory Visit (HOSPITAL_COMMUNITY): Payer: Medicaid Other

## 2014-02-11 ENCOUNTER — Ambulatory Visit (HOSPITAL_COMMUNITY): Admission: RE | Admit: 2014-02-11 | Payer: Medicaid Other | Source: Ambulatory Visit

## 2014-04-22 ENCOUNTER — Other Ambulatory Visit: Payer: Self-pay | Admitting: Internal Medicine

## 2014-04-23 ENCOUNTER — Other Ambulatory Visit: Payer: Self-pay | Admitting: Internal Medicine

## 2014-05-06 ENCOUNTER — Emergency Department (HOSPITAL_COMMUNITY)
Admission: EM | Admit: 2014-05-06 | Discharge: 2014-05-06 | Disposition: A | Discharge status: Home / Self Care | Payer: Medicaid Other | Attending: Emergency Medicine | Admitting: Emergency Medicine

## 2014-05-06 ENCOUNTER — Emergency Department (HOSPITAL_COMMUNITY): Payer: Medicaid Other

## 2014-05-06 ENCOUNTER — Encounter (HOSPITAL_COMMUNITY): Payer: Self-pay

## 2014-05-06 DIAGNOSIS — Z8669 Personal history of other diseases of the nervous system and sense organs: Secondary | ICD-10-CM | POA: Insufficient documentation

## 2014-05-06 DIAGNOSIS — Z87442 Personal history of urinary calculi: Secondary | ICD-10-CM | POA: Insufficient documentation

## 2014-05-06 DIAGNOSIS — Z86011 Personal history of benign neoplasm of the brain: Secondary | ICD-10-CM | POA: Diagnosis not present

## 2014-05-06 DIAGNOSIS — Z8543 Personal history of malignant neoplasm of ovary: Secondary | ICD-10-CM | POA: Insufficient documentation

## 2014-05-06 DIAGNOSIS — Z79899 Other long term (current) drug therapy: Secondary | ICD-10-CM | POA: Diagnosis not present

## 2014-05-06 DIAGNOSIS — Z8659 Personal history of other mental and behavioral disorders: Secondary | ICD-10-CM | POA: Insufficient documentation

## 2014-05-06 DIAGNOSIS — J069 Acute upper respiratory infection, unspecified: Secondary | ICD-10-CM | POA: Diagnosis not present

## 2014-05-06 DIAGNOSIS — Z87891 Personal history of nicotine dependence: Secondary | ICD-10-CM | POA: Diagnosis not present

## 2014-05-06 DIAGNOSIS — R05 Cough: Secondary | ICD-10-CM | POA: Diagnosis present

## 2014-05-06 MED ORDER — DOXYCYCLINE HYCLATE 100 MG PO CAPS: 100.0000 mg | ORAL_CAPSULE | Freq: Two times a day (BID) | ORAL | Status: DC

## 2014-05-06 MED ORDER — BENZONATATE 100 MG PO CAPS: 100.0000 mg | ORAL_CAPSULE | Freq: Three times a day (TID) | ORAL | Status: DC

## 2014-05-06 MED ORDER — OXYCODONE-ACETAMINOPHEN 5-325 MG PO TABS: 1.0000 | ORAL_TABLET | ORAL | Status: DC | PRN

## 2014-05-06 MED ORDER — PREDNISONE 20 MG PO TABS: 60.0000 mg | ORAL_TABLET | Freq: Every day | ORAL | Status: DC

## 2014-05-06 NOTE — ED Notes (Signed)
Patient transported to X-ray 

## 2014-05-06 NOTE — ED Notes (Signed)
MD at bedside. 

## 2014-05-06 NOTE — ED Provider Notes (Signed)
CSN: [id number]     Arrival date & time 05/06/14  4098 History   First MD Initiated Contact with Patient 05/06/14 [id number]     Chief Complaint  Patient presents with  . Cough  . Shortness of Breath     (Consider location/radiation/quality/duration/timing/severity/associated sxs/prior Treatment) HPI Comments: Patient presents to the ER for evaluation of cough and shortness of breath. Symptoms present for 3 days. Patient reports a previous history of asthma. She reports she gets like this every year. She is been using her albuterol inhaler but it has not helped. Patient reports sinus congestion and pain associated with the symptoms. She has not had a fever.  Patient is a 29 y.o. female presenting with cough and shortness of breath.  Cough Associated symptoms: shortness of breath   Shortness of Breath Associated symptoms: cough     Past Medical History  Diagnosis Date  . 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 Brain tumor   . Schizoaffective disorder   . Anxiety   . Depression   . Seizures   . Kidney stones   . Kidney stone   . Cancer 1991    ovarian  . Ovarian cancer    Past Surgical History  Procedure Laterality Date  . Kidney stone removal    . Appendectomy    . Abdominal hysterectomy      partial; took fallopian tubes and ovaries   Family History  Problem Relation Age of Onset  . Diabetes type II Other   . Asthma Mother    History  Substance Use Topics  . Smoking status: Former Smoker -- 0.25 packs/day for 2 years    Types: Cigarettes    Quit date: 07/27/2012  . Smokeless tobacco: Never Used  . Alcohol Use: No   OB History    No data available     Review of Systems  HENT: Positive for congestion.   Respiratory: Positive for cough and shortness of breath.   All other systems reviewed and are negative.     Allergies  Ketorolac tromethamine; Lortab; and Tramadol  Home Medications   Prior to Admission medications   Medication Sig Start Date  End Date Taking? Authorizing Provider  albuterol (PROAIR HFA) 108 (90 BASE) MCG/ACT inhaler Inhale 2 puffs into the lungs every 6 (six) hours as needed for wheezing or shortness of breath.   Yes Historical Provider, MD  Camphor-Eucalyptus-Menthol (MEDICATED CHEST RUB EX) Apply 1 application topically at bedtime. Rubs on chest every night   Yes Historical Provider, MD  famotidine (PEPCID) 20 MG tablet One at bedtime 12/04/13  Yes Tanda Rockers, MD  montelukast (SINGULAIR) 10 MG tablet Take 10 mg by mouth at bedtime.   Yes Historical Provider, MD  nebivolol (BYSTOLIC) 10 MG tablet Take 1 tablet (10 mg total) by mouth daily. 10/09/13  Yes Tanda Rockers, MD  ondansetron (ZOFRAN) 4 MG tablet Take 1 tablet (4 mg total) by mouth every 6 (six) hours. Patient taking differently: Take 4 mg by mouth every 8 (eight) hours as needed for nausea.  12/05/13  Yes Antonietta Breach, PA-C  oxyCODONE-acetaminophen (PERCOCET/ROXICET) 5-325 MG per tablet Take 1 tablet by mouth every 4 (four) hours as needed for severe pain. 01/13/14  Yes Peggy Constant, MD  pantoprazole (PROTONIX) 40 MG tablet Take 1 tablet (40 mg total) by mouth daily. 11/20/13  Yes Tanda Rockers, MD  Respiratory Therapy Supplies (FLUTTER) DEVI Use as directed 12/04/13  Yes Tanda Rockers, MD  albuterol (  ACCUNEB) 1.25 MG/3ML nebulizer solution Take 1 ampule by nebulization 3 (three) times daily.    Historical Provider, MD  benzonatate (TESSALON) 100 MG capsule Take 1 capsule (100 mg total) by mouth every 8 (eight) hours. 05/06/14   Orpah Greek, MD  doxycycline (VIBRAMYCIN) 100 MG capsule Take 1 capsule (100 mg total) by mouth 2 (two) times daily. 05/06/14   Orpah Greek, MD  predniSONE (DELTASONE) 20 MG tablet Take 3 tablets (60 mg total) by mouth daily with breakfast. 05/06/14   Orpah Greek, MD  promethazine (PHENERGAN) 25 MG tablet Take 25 mg by mouth every 6 (six) hours as needed for nausea or vomiting.    Historical Provider, MD    BP 148/87 mmHg  Pulse 90  Temp(Src) 98.5 F (36.9 C) (Oral)  Resp 20  SpO2 99% Physical Exam  Constitutional: She is oriented to person, place, and time. She appears well-developed and well-nourished. No distress.  HENT:  Head: Normocephalic and atraumatic.  Right Ear: Hearing normal.  Left Ear: Hearing normal.  Nose: Nose normal.  Mouth/Throat: Oropharynx is clear and moist and mucous membranes are normal.  Eyes: Conjunctivae and EOM are normal. Pupils are equal, round, and reactive to light.  Neck: Normal range of motion. Neck supple.  Cardiovascular: Regular rhythm, S1 normal and S2 normal.  Exam reveals no gallop and no friction rub.   No murmur heard. Pulmonary/Chest: Effort normal and breath sounds normal. No respiratory distress. She exhibits no tenderness.  Abdominal: Soft. Normal appearance and bowel sounds are normal. There is no hepatosplenomegaly. There is no tenderness. There is no rebound, no guarding, no tenderness at McBurney's point and negative Murphy's sign. No hernia.  Musculoskeletal: Normal range of motion.  Neurological: She is alert and oriented to person, place, and time. She has normal strength. No cranial nerve deficit or sensory deficit. Coordination normal. GCS eye subscore is 4. GCS verbal subscore is 5. GCS motor subscore is 6.  Skin: Skin is warm, dry and intact. No rash noted. No cyanosis.  Psychiatric: She has a normal mood and affect. Her speech is normal and behavior is normal. Thought content normal.  Nursing note and vitals reviewed.   ED Course  Procedures (including critical care time) Labs Review Labs Reviewed - No data to display  Imaging Review Dg Chest 2 View  05/06/2014   CLINICAL DATA:  Cough, shortness of breath for 2 days  EXAM: CHEST  2 VIEW  COMPARISON:  11/25/2013  FINDINGS: Cardiomediastinal silhouette is stable. No acute infiltrate or pleural effusion. No pulmonary edema. Bony thorax is unremarkable.  IMPRESSION: No active  cardiopulmonary disease.   Electronically Signed   By: Lahoma Crocker M.D.   On: 05/06/2014 09:30     EKG Interpretation None      MDM   Final diagnoses:  URI (upper respiratory infection)    Presents to the ER for evaluation of cough, congestion, increased shortness of breath. Patient has a history of asthma. Chest x-ray is clear, no evidence of pneumonia. She is not hypoxic. Minimal wheezing currently. Will treat with doxycycline, prednisone, continue bronchodilator therapy. Follow-up with Dr. [name], her pulmonologist. Return if symptoms worsen.    Orpah Greek, MD 05/06/14 1034

## 2014-05-06 NOTE — ED Notes (Signed)
Pharmacy at bedside

## 2014-05-06 NOTE — ED Notes (Signed)
Pt with cough and shortness of breath x 2 days.  Productive.  Nausea with no vomiting.  No fever

## 2014-05-06 NOTE — Discharge Instructions (Signed)
Upper Respiratory Infection, Adult An upper respiratory infection (URI) is also sometimes known as the common cold. The upper respiratory tract includes the nose, sinuses, throat, trachea, and bronchi. Bronchi are the airways leading to the lungs. Most people improve within 1 week, but symptoms can last up to 2 weeks. A residual cough may last even longer.  CAUSES Many different viruses can infect the tissues lining the upper respiratory tract. The tissues become irritated and inflamed and often become very moist. Mucus production is also common. A cold is contagious. You can easily spread the virus to others by oral contact. This includes kissing, sharing a glass, coughing, or sneezing. Touching your mouth or nose and then touching a surface, which is then touched by another person, can also spread the virus. SYMPTOMS  Symptoms typically develop 1 to 3 days after you come in contact with a cold virus. Symptoms vary from person to person. They may include:  Runny nose.  Sneezing.  Nasal congestion.  Sinus irritation.  Sore throat.  Loss of voice (laryngitis).  Cough.  Fatigue.  Muscle aches.  Loss of appetite.  Headache.  Low-grade fever. DIAGNOSIS  You might diagnose your own cold based on familiar symptoms, since most people get a cold 2 to 3 times a year. Your caregiver can confirm this based on your exam. Most importantly, your caregiver can check that your symptoms are not due to another disease such as strep throat, sinusitis, pneumonia, asthma, or epiglottitis. Blood tests, throat tests, and X-rays are not necessary to diagnose a common cold, but they may sometimes be helpful in excluding other more serious diseases. Your caregiver will decide if any further tests are required. RISKS AND COMPLICATIONS  You may be at risk for a more severe case of the common cold if you smoke cigarettes, have chronic heart disease (such as heart failure) or lung disease (such as asthma), or if  you have a weakened immune system. The very young and very old are also at risk for more serious infections. Bacterial sinusitis, middle ear infections, and bacterial pneumonia can complicate the common cold. The common cold can worsen asthma and chronic obstructive pulmonary disease (COPD). Sometimes, these complications can require emergency medical care and may be life-threatening. PREVENTION  The best way to protect against getting a cold is to practice good hygiene. Avoid oral or hand contact with people with cold symptoms. Wash your hands often if contact occurs. There is no clear evidence that vitamin C, vitamin E, echinacea, or exercise reduces the chance of developing a cold. However, it is always recommended to get plenty of rest and practice good nutrition. TREATMENT  Treatment is directed at relieving symptoms. There is no cure. Antibiotics are not effective, because the infection is caused by a virus, not by bacteria. Treatment may include:  Increased fluid intake. Sports drinks offer valuable electrolytes, sugars, and fluids.  Breathing heated mist or steam (vaporizer or shower).  Eating chicken soup or other clear broths, and maintaining good nutrition.  Getting plenty of rest.  Using gargles or lozenges for comfort.  Controlling fevers with ibuprofen or acetaminophen as directed by your caregiver.  Increasing usage of your inhaler if you have asthma. Zinc gel and zinc lozenges, taken in the first 24 hours of the common cold, can shorten the duration and lessen the severity of symptoms. Pain medicines may help with fever, muscle aches, and throat pain. A variety of non-prescription medicines are available to treat congestion and runny nose. Your caregiver  can make recommendations and may suggest nasal or lung inhalers for other symptoms.  HOME CARE INSTRUCTIONS   Only take over-the-counter or prescription medicines for pain, discomfort, or fever as directed by your  caregiver.  Use a warm mist humidifier or inhale steam from a shower to increase air moisture. This may keep secretions moist and make it easier to breathe.  Drink enough water and fluids to keep your urine clear or pale yellow.  Rest as needed.  Return to work when your temperature has returned to normal or as your caregiver advises. You may need to stay home longer to avoid infecting others. You can also use a face mask and careful hand washing to prevent spread of the virus. SEEK MEDICAL CARE IF:   After the first few days, you feel you are getting worse rather than better.  You need your caregiver's advice about medicines to control symptoms.  You develop chills, worsening shortness of breath, or brown or red sputum. These may be signs of pneumonia.  You develop yellow or brown nasal discharge or pain in the face, especially when you bend forward. These may be signs of sinusitis.  You develop a fever, swollen neck glands, pain with swallowing, or white areas in the back of your throat. These may be signs of strep throat. SEEK IMMEDIATE MEDICAL CARE IF:   You have a fever.  You develop severe or persistent headache, ear pain, sinus pain, or chest pain.  You develop wheezing, a prolonged cough, cough up blood, or have a change in your usual mucus (if you have chronic lung disease).  You develop sore muscles or a stiff neck. Document Released: 08/08/2000 Document Revised: 05/07/2011 Document Reviewed: 05/20/2013 [facility] Patient Information 2015 [place], [place]. This information is not intended to replace advice given to you by your health care provider. Make sure you discuss any questions you have with your health care provider. Asthma Asthma is a recurring condition in which the airways tighten and narrow. Asthma can make it difficult to breathe. It can cause coughing, wheezing, and shortness of breath. Asthma episodes, also called asthma attacks, range from minor to  life-threatening. Asthma cannot be cured, but medicines and lifestyle changes can help control it. CAUSES Asthma is believed to be caused by inherited (genetic) and environmental factors, but its exact cause is unknown. Asthma may be triggered by allergens, lung infections, or irritants in the air. Asthma triggers are different for each person. Common triggers include:   Animal dander.  Dust mites.  Cockroaches.  Pollen from trees or grass.  Mold.  Smoke.  Air pollutants such as dust, household cleaners, hair sprays, aerosol sprays, paint fumes, strong chemicals, or strong odors.  Cold air, weather changes, and winds (which increase molds and pollens in the air).  Strong emotional expressions such as crying or laughing hard.  Stress.  Certain medicines (such as aspirin) or types of drugs (such as beta-blockers).  Sulfites in foods and drinks. Foods and drinks that may contain sulfites include dried fruit, potato chips, and sparkling grape juice.  Infections or inflammatory conditions such as the flu, a cold, or an inflammation of the nasal membranes (rhinitis).  Gastroesophageal reflux disease (GERD).  Exercise or strenuous activity. SYMPTOMS Symptoms may occur immediately after asthma is triggered or many hours later. Symptoms include:  Wheezing.  Excessive nighttime or early morning coughing.  Frequent or severe coughing with a common cold.  Chest tightness.  Shortness of breath. DIAGNOSIS  The diagnosis of asthma is  made by a review of your medical history and a physical exam. Tests may also be performed. These may include:  Lung function studies. These tests show how much air you breathe in and out.  Allergy tests.  Imaging tests such as X-rays. TREATMENT  Asthma cannot be cured, but it can usually be controlled. Treatment involves identifying and avoiding your asthma triggers. It also involves medicines. There are 2 classes of medicine used for asthma  treatment:   Controller medicines. These prevent asthma symptoms from occurring. They are usually taken every day.  Reliever or rescue medicines. These quickly relieve asthma symptoms. They are used as needed and provide short-term relief. Your health care provider will help you create an asthma action plan. An asthma action plan is a written plan for managing and treating your asthma attacks. It includes a list of your asthma triggers and how they may be avoided. It also includes information on when medicines should be taken and when their dosage should be changed. An action plan may also involve the use of a device called a peak flow meter. A peak flow meter measures how well the lungs are working. It helps you monitor your condition. HOME CARE INSTRUCTIONS   Take medicines only as directed by your health care provider. Speak with your health care provider if you have questions about how or when to take the medicines.  Use a peak flow meter as directed by your health care provider. Record and keep track of readings.  Understand and use the action plan to help minimize or stop an asthma attack without needing to seek medical care.  Control your home environment in the following ways to help prevent asthma attacks:  Do not smoke. Avoid being exposed to secondhand smoke.  Change your heating and air conditioning filter regularly.  Limit your use of fireplaces and wood stoves.  Get rid of pests (such as roaches and mice) and their droppings.  Throw away plants if you see mold on them.  Clean your floors and dust regularly. Use unscented cleaning products.  Try to have someone else vacuum for you regularly. Stay out of rooms while they are being vacuumed and for a short while afterward. If you vacuum, use a dust mask from a hardware store, a double-layered or microfilter vacuum cleaner bag, or a vacuum cleaner with a HEPA filter.  Replace carpet with wood, tile, or vinyl flooring. Carpet  can trap dander and dust.  Use allergy-proof pillows, mattress covers, and box spring covers.  Wash bed sheets and blankets every week in hot water and dry them in a dryer.  Use blankets that are made of polyester or cotton.  Clean bathrooms and kitchens with bleach. If possible, have someone repaint the walls in these rooms with mold-resistant paint. Keep out of the rooms that are being cleaned and painted.  Wash hands frequently. SEEK MEDICAL CARE IF:   You have wheezing, shortness of breath, or a cough even if taking medicine to prevent attacks.  The colored mucus you cough up (sputum) is thicker than usual.  Your sputum changes from clear or white to yellow, green, gray, or bloody.  You have any problems that may be related to the medicines you are taking (such as a rash, itching, swelling, or trouble breathing).  You are using a reliever medicine more than 2-3 times per week.  Your peak flow is still at 50-79% of your personal best after following your action plan for 1 hour.  You  have a fever. SEEK IMMEDIATE MEDICAL CARE IF:   You seem to be getting worse and are unresponsive to treatment during an asthma attack.  You are short of breath even at rest.  You get short of breath when doing very little physical activity.  You have difficulty eating, drinking, or talking due to asthma symptoms.  You develop chest pain.  You develop a fast heartbeat.  You have a bluish color to your lips or fingernails.  You are light-headed, dizzy, or faint.  Your peak flow is less than 50% of your personal best. MAKE SURE YOU:   Understand these instructions.  Will watch your condition.  Will get help right away if you are not doing well or get worse. Document Released: 02/12/2005 Document Revised: 06/29/2013 Document Reviewed: 09/11/2012 [facility] Patient Information 2015 [place], [place]. This information is not intended to replace advice given to you by your health care  provider. Make sure you discuss any questions you have with your health care provider.

## 2014-06-30 ENCOUNTER — Inpatient Hospital Stay (HOSPITAL_COMMUNITY)
Admission: AD | Admit: 2014-06-30 | Discharge: 2014-06-30 | Disposition: A | Discharge status: Home / Self Care | Payer: Medicaid Other | Source: Ambulatory Visit | Attending: Obstetrics and Gynecology | Admitting: Obstetrics and Gynecology

## 2014-06-30 ENCOUNTER — Encounter (HOSPITAL_COMMUNITY): Payer: Self-pay | Admitting: *Deleted

## 2014-06-30 DIAGNOSIS — Z87891 Personal history of nicotine dependence: Secondary | ICD-10-CM | POA: Insufficient documentation

## 2014-06-30 DIAGNOSIS — I10 Essential (primary) hypertension: Secondary | ICD-10-CM | POA: Diagnosis not present

## 2014-06-30 DIAGNOSIS — N9089 Other specified noninflammatory disorders of vulva and perineum: Secondary | ICD-10-CM | POA: Diagnosis not present

## 2014-06-30 DIAGNOSIS — Z87442 Personal history of urinary calculi: Secondary | ICD-10-CM | POA: Insufficient documentation

## 2014-06-30 DIAGNOSIS — R102 Pelvic and perineal pain: Secondary | ICD-10-CM | POA: Insufficient documentation

## 2014-06-30 DIAGNOSIS — N909 Noninflammatory disorder of vulva and perineum, unspecified: Secondary | ICD-10-CM | POA: Diagnosis not present

## 2014-06-30 LAB — URINE MICROSCOPIC-ADD ON

## 2014-06-30 LAB — URINALYSIS, ROUTINE W REFLEX MICROSCOPIC
Bilirubin Urine: NEGATIVE
Glucose, UA: NEGATIVE mg/dL
Ketones, ur: NEGATIVE mg/dL
LEUKOCYTES UA: NEGATIVE
Nitrite: NEGATIVE
PROTEIN: NEGATIVE mg/dL
Specific Gravity, Urine: 1.025 (ref 1.005–1.030)
Urobilinogen, UA: 1 mg/dL (ref 0.0–1.0)
pH: 6.5 (ref 5.0–8.0)

## 2014-06-30 LAB — WET PREP, GENITAL
CLUE CELLS WET PREP: NONE SEEN
Trich, Wet Prep: NONE SEEN
Yeast Wet Prep HPF POC: NONE SEEN

## 2014-06-30 LAB — POCT PREGNANCY, URINE: PREG TEST UR: NEGATIVE

## 2014-06-30 MED ORDER — OXYCODONE-ACETAMINOPHEN 5-325 MG PO TABS: 1.0000 | ORAL_TABLET | Freq: Four times a day (QID) | ORAL | Status: DC | PRN

## 2014-06-30 MED ORDER — OXYCODONE-ACETAMINOPHEN 5-325 MG PO TABS
2.0000 | ORAL_TABLET | Freq: Once | ORAL | Status: AC
  Administered 2014-06-30: 2 via ORAL
  Filled 2014-06-30: qty 2

## 2014-06-30 MED ORDER — HYDROCHLOROTHIAZIDE 25 MG PO TABS: 25.0000 mg | ORAL_TABLET | Freq: Every day | ORAL | Status: DC

## 2014-06-30 MED ORDER — LIDOCAINE HCL 2 % EX GEL
1.0000 "application " | Freq: Once | CUTANEOUS | Status: AC
  Administered 2014-06-30: 1 via TOPICAL
  Filled 2014-06-30: qty 5

## 2014-06-30 NOTE — MAU Provider Note (Signed)
Chief Complaint: Vaginal Pain   None     SUBJECTIVE HPI: [name] is a 29 y.o. G0 who presents to maternity admissions reporting a bump in her vaginal area that she noticed 3 days ago.  She reports the bump appeared and she thought it was a boil or pimple so she tried to pop it but nothing came out.  There was scant bleeding from the bump after she tried to pop it.  Then it became extremely painful.  She denies history of hypertension and reports that pain is causing her BP to be up today. She has primary care provider, with last visit 3 months ago. Her history includes essential HTN with trial of Ace inhibitors but was taken off of them in 2015.  She also reports cramping intermittent abdominal pain.  She reports she had Gyn provider at [facility] a few years ago but was unable to make payment and was discharged from practice.  She has hx of ovarian cancer as a child with removal of ovaries.  Pt reports she has a uterus but has never had periods related to her surgery. She has had abnormal Pap in the past so does have her cervix.  She has been seen at [facility] for similar bumps, and has had I&D for abscess before. She reports she usually takes Percocet for a few days for these episodes but does not take it regularly.  She denies vaginal bleeding, vaginal itching/burning, urinary symptoms, h/a, dizziness, n/v, or fever/chills.     Vaginal Pain The patient's primary symptoms include genital lesions and pelvic pain. This is a recurrent problem. The current episode started in the past 7 days. The problem occurs constantly. The problem has been gradually worsening. The pain is severe. The problem affects the left side. She is not pregnant. Associated symptoms include abdominal pain. Pertinent negatives include no chills, dysuria, fever, frequency, headaches, nausea, urgency or vomiting. She has tried NSAIDs for the symptoms. The treatment provided no relief. She is not sexually active. She uses nothing for  contraception. She is postmenopausal. Her past medical history is significant for an abdominal surgery.  Hypertension This is a new problem. The current episode started today. The problem is unchanged. The problem is uncontrolled. Pertinent negatives include no blurred vision, chest pain, headaches, malaise/fatigue, palpitations or shortness of breath. There are no associated agents to hypertension. Risk factors for coronary artery disease include obesity and post-menopausal state. Past treatments include nothing.    Past Medical History  Diagnosis Date  . 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 Brain tumor   . Schizoaffective disorder   . Anxiety   . Depression   . Seizures   . Kidney stones   . Kidney stone   . Cancer 1991    ovarian  . Ovarian cancer    Past Surgical History  Procedure Laterality Date  . Kidney stone removal    . Appendectomy    . Abdominal hysterectomy      partial; took fallopian tubes and ovaries   History   Social History  . Marital Status: Single    Spouse Name: N/A  . Number of Children: N/A  . Years of Education: N/A   Occupational History  . Student     Social History Main Topics  . Smoking status: Former Smoker -- 0.25 packs/day for 2 years    Types: Cigarettes    Quit date: 07/27/2012  . Smokeless tobacco: Never Used  . Alcohol Use: No  .  Drug Use: No     Comment: Sts quit 2-3 months ago  . Sexual Activity: Not Currently    Birth Control/ Protection: Surgical   Other Topics Concern  . Not on file   Social History Narrative   No current facility-administered medications on file prior to encounter.   Current Outpatient Prescriptions on File Prior to Encounter  Medication Sig Dispense Refill  . albuterol (ACCUNEB) 1.25 MG/3ML nebulizer solution Take 1 ampule by nebulization 3 (three) times daily.    Marland Kitchen albuterol (PROAIR HFA) 108 (90 BASE) MCG/ACT inhaler Inhale 2 puffs into the lungs every 6 (six) hours as needed for  wheezing or shortness of breath.    Marland Kitchen Respiratory Therapy Supplies (FLUTTER) DEVI Use as directed 1 each 0   Allergies  Allergen Reactions  . Ketorolac Tromethamine Hives  . Lortab [Hydrocodone-Acetaminophen] Hives    Tolerates acetaminophen  . Tramadol Hives    Review of Systems  Constitutional: Negative for fever, chills and malaise/fatigue.  Eyes: Negative for blurred vision.  Respiratory: Negative for cough and shortness of breath.   Cardiovascular: Negative for chest pain and palpitations.  Gastrointestinal: Positive for abdominal pain. Negative for heartburn, nausea and vomiting.  Genitourinary: Positive for vaginal pain and pelvic pain. Negative for dysuria, urgency and frequency.  Musculoskeletal: Negative.   Neurological: Negative for dizziness and headaches.  Psychiatric/Behavioral: Negative for depression.    OBJECTIVE Blood pressure 147/103, pulse 80, temperature 98.2 F (36.8 C), temperature source Oral, resp. rate 18, height 5\' 5"  (1.651 m), weight 136.17 kg (300 lb 3.2 oz).   Patient Vitals for the past 24 hrs:  BP Temp Temp src Pulse Resp Height Weight  06/30/14 1447 (!) 147/103 mmHg - - 80 18 - -  06/30/14 1435 (!) 147/103 mmHg - - 80 18 - -  06/30/14 1321 (!) 170/106 mmHg - - 68 - - -  06/30/14 1311 138/87 mmHg - - 78 - - -  06/30/14 1301 129/82 mmHg - - 76 - - -  06/30/14 1251 (!) 161/103 mmHg - - 72 - - -  06/30/14 1241 160/98 mmHg - - 72 18 - -  06/30/14 1231 150/98 mmHg - - 80 18 - -  06/30/14 1221 150/83 mmHg - - 80 - - -  06/30/14 1212 (!) 148/104 mmHg - - 83 18 - -  06/30/14 1210 150/97 mmHg - - 84 18 - -  06/30/14 1206 (!) 162/104 mmHg - - 82 18 - -  06/30/14 1141 (!) 166/103 mmHg 98.2 F (36.8 C) Oral 87 18 5\' 5"  (1.651 m) (!) 136.17 kg (300 lb 3.2 oz)   GENERAL: Well-developed, well-nourished female in no acute distress.  EYES: normal sclera/conjunctiva; no lid-lag HENT: Atraumatic, normocephalic HEART: normal rate RESP: normal effort GI:  Soft, non-tender MUSCULOSKELETAL: Normal ROM EXTREMITIES: Nontender, no edema NEURO/PSYCH: Alert and oriented, appropriate affect  GU: Cervix pink, visually closed, without lesion, scant white creamy discharge, vaginal walls and external genitalia normal except for 1 cm raised hard, immobile lesion with 0.5 cm flat area of open skin that is painful to palpation Bimanual exam: Cervix 0/long/high, firm, anterior, neg CMT, uterus and ovaries difficult to palpate due to body habitus  LAB RESULTS Results for orders placed or performed during the hospital encounter of 06/30/14 (from the past 24 hour(s))  Urinalysis, Routine w reflex microscopic     Status: Abnormal   Collection Time: 06/30/14 11:45 AM  Result Value Ref Range   Color, Urine YELLOW YELLOW   APPearance  CLEAR CLEAR   Specific Gravity, Urine 1.025 1.005 - 1.030   pH 6.5 5.0 - 8.0   Glucose, UA NEGATIVE NEGATIVE mg/dL   Hgb urine dipstick TRACE (A) NEGATIVE   Bilirubin Urine NEGATIVE NEGATIVE   Ketones, ur NEGATIVE NEGATIVE mg/dL   Protein, ur NEGATIVE NEGATIVE mg/dL   Urobilinogen, UA 1.0 0.0 - 1.0 mg/dL   Nitrite NEGATIVE NEGATIVE   Leukocytes, UA NEGATIVE NEGATIVE  Urine microscopic-add on     Status: Abnormal   Collection Time: 06/30/14 11:45 AM  Result Value Ref Range   Squamous Epithelial / LPF MANY (A) RARE   WBC, UA 0-2 <3 WBC/hpf   RBC / HPF 0-2 <3 RBC/hpf   Bacteria, UA MANY (A) RARE   Urine-Other MUCOUS PRESENT   Pregnancy, urine POC     Status: None   Collection Time: 06/30/14 12:08 PM  Result Value Ref Range   Preg Test, Ur NEGATIVE NEGATIVE  Wet prep, genital     Status: Abnormal   Collection Time: 06/30/14  1:35 PM  Result Value Ref Range   Yeast Wet Prep HPF POC NONE SEEN NONE SEEN   Trich, Wet Prep NONE SEEN NONE SEEN   Clue Cells Wet Prep HPF POC NONE SEEN NONE SEEN   WBC, Wet Prep HPF POC FEW (A) NONE SEEN    ASSESSMENT 1. Labial lesion   2. Essential hypertension     PLAN Discharge  home Topical lidocaine jelly PRN Percocet 5/325, take 1-2 tabs Q 6 hours PRN x 10 tabs HCTZ 25 mg tab daily F/U with primary care r/t HTN F/U with [facility]--message sent to begin care Records from [facility] requested  Follow-up Information    Follow up with [facility].   Specialty:  Obstetrics and Gynecology   Why:  The clinic will call you with appointment.   Contact information:   [place] [phone]      Follow up with [facility].   Why:  As needed for emergencies   Contact information:   [address] [id number] [place] Certified Nurse-Midwife 06/30/2014  3:01 PM

## 2014-06-30 NOTE — MAU Note (Signed)
States that she was diagnosed at age 29 with cervical or uterine cancer and had a hysterectomy; states that she is not sexually active; c/o vaginal pain and "bump" inside vagina on the L; denies any hypertension but bp's are elevated today;

## 2014-06-30 NOTE — Discharge Instructions (Signed)
Pelvic Pain Female pelvic pain can be caused by many different things and start from a variety of places. Pelvic pain refers to pain that is located in the lower half of the abdomen and between your hips. The pain may occur over a short period of time (acute) or may be reoccurring (chronic). The cause of pelvic pain may be related to disorders affecting the female reproductive organs (gynecologic), but it may also be related to the bladder, kidney stones, an intestinal complication, or muscle or skeletal problems. Getting help right away for pelvic pain is important, especially if there has been severe, sharp, or a sudden onset of unusual pain. It is also important to get help right away because some types of pelvic pain can be life threatening.  CAUSES  Below are only some of the causes of pelvic pain. The causes of pelvic pain can be in one of several categories.   Gynecologic.  Pelvic inflammatory disease.  Sexually transmitted infection.  Ovarian cyst or a twisted ovarian ligament (ovarian torsion).  Uterine lining that grows outside the uterus (endometriosis).  Fibroids, cysts, or tumors.  Ovulation.  Pregnancy.  Pregnancy that occurs outside the uterus (ectopic pregnancy).  Miscarriage.  Labor.  Abruption of the placenta or ruptured uterus.  Infection.  Uterine infection (endometritis).  Bladder infection.  Diverticulitis.  Miscarriage related to a uterine infection (septic abortion).  Bladder.  Inflammation of the bladder (cystitis).  Kidney stone(s).  Gastrointestinal.  Constipation.  Diverticulitis.  Neurologic.  Trauma.  Feeling pelvic pain because of mental or emotional causes (psychosomatic).  Cancers of the bowel or pelvis. EVALUATION  Your caregiver will want to take a careful history of your concerns. This includes recent changes in your health, a careful gynecologic history of your periods (menses), and a sexual history. Obtaining your family  history and medical history is also important. Your caregiver may suggest a pelvic exam. A pelvic exam will help identify the location and severity of the pain. It also helps in the evaluation of which organ system may be involved. In order to identify the cause of the pelvic pain and be properly treated, your caregiver may order tests. These tests may include:   A pregnancy test.  Pelvic ultrasonography.  An X-ray exam of the abdomen.  A urinalysis or evaluation of vaginal discharge.  Blood tests. HOME CARE INSTRUCTIONS   Only take over-the-counter or prescription medicines for pain, discomfort, or fever as directed by your caregiver.   Rest as directed by your caregiver.   Eat a balanced diet.   Drink enough fluids to make your urine clear or pale yellow, or as directed.   Avoid sexual intercourse if it causes pain.   Apply warm or cold compresses to the lower abdomen depending on which one helps the pain.   Avoid stressful situations.   Keep a journal of your pelvic pain. Write down when it started, where the pain is located, and if there are things that seem to be associated with the pain, such as food or your menstrual cycle.  Follow up with your caregiver as directed.  SEEK MEDICAL CARE IF:  Your medicine does not help your pain.  You have abnormal vaginal discharge. SEEK IMMEDIATE MEDICAL CARE IF:   You have heavy bleeding from the vagina.   Your pelvic pain increases.   You feel light-headed or faint.   You have chills.   You have pain with urination or blood in your urine.   You have uncontrolled diarrhea   or vomiting.   You have a fever or persistent symptoms for more than 3 days.  You have a fever and your symptoms suddenly get worse.   You are being physically or sexually abused.  MAKE SURE YOU:  Understand these instructions.  Will watch your condition.  Will get help if you are not doing well or get worse. Document Released:  01/10/2004 Document Revised: 06/29/2013 Document Reviewed: 06/04/2011 [facility] Patient Information 2015 [place], [place]. This information is not intended to replace advice given to you by your health care provider. Make sure you discuss any questions you have with your health care provider.   Hypertension Hypertension, commonly called high blood pressure, is when the force of blood pumping through your arteries is too strong. Your arteries are the blood vessels that carry blood from your heart throughout your body. A blood pressure reading consists of a higher number over a lower number, such as 110/72. The higher number (systolic) is the pressure inside your arteries when your heart pumps. The lower number (diastolic) is the pressure inside your arteries when your heart relaxes. Ideally you want your blood pressure below 120/80. Hypertension forces your heart to work harder to pump blood. Your arteries may become narrow or stiff. Having hypertension puts you at risk for heart disease, stroke, and other problems.  RISK FACTORS Some risk factors for high blood pressure are controllable. Others are not.  Risk factors you cannot control include:   Race. You may be at higher risk if you are African American.  Age. Risk increases with age.  Gender. Men are at higher risk than women before age 47 years. After age 28, women are at higher risk than men. Risk factors you can control include:  Not getting enough exercise or physical activity.  Being overweight.  Getting too much fat, sugar, calories, or salt in your diet.  Drinking too much alcohol. SIGNS AND SYMPTOMS Hypertension does not usually cause signs or symptoms. Extremely high blood pressure (hypertensive crisis) may cause headache, anxiety, shortness of breath, and nosebleed. DIAGNOSIS  To check if you have hypertension, your health care provider will measure your blood pressure while you are seated, with your arm held at the level of  your heart. It should be measured at least twice using the same arm. Certain conditions can cause a difference in blood pressure between your right and left arms. A blood pressure reading that is higher than normal on one occasion does not mean that you need treatment. If one blood pressure reading is high, ask your health care provider about having it checked again. TREATMENT  Treating high blood pressure includes making lifestyle changes and possibly taking medicine. Living a healthy lifestyle can help lower high blood pressure. You may need to change some of your habits. Lifestyle changes may include:  Following the DASH diet. This diet is high in fruits, vegetables, and whole grains. It is low in salt, red meat, and added sugars.  Getting at least 2 hours of brisk physical activity every week.  Losing weight if necessary.  Not smoking.  Limiting alcoholic beverages.  Learning ways to reduce stress. If lifestyle changes are not enough to get your blood pressure under control, your health care provider may prescribe medicine. You may need to take more than one. Work closely with your health care provider to understand the risks and benefits. HOME CARE INSTRUCTIONS  Have your blood pressure rechecked as directed by your health care provider.   Take medicines only as  directed by your health care provider. Follow the directions carefully. Blood pressure medicines must be taken as prescribed. The medicine does not work as well when you skip doses. Skipping doses also puts you at risk for problems.   Do not smoke.   Monitor your blood pressure at home as directed by your health care provider. SEEK MEDICAL CARE IF:   You think you are having a reaction to medicines taken.  You have recurrent headaches or feel dizzy.  You have swelling in your ankles.  You have trouble with your vision. SEEK IMMEDIATE MEDICAL CARE IF:  You develop a severe headache or confusion.  You have  unusual weakness, numbness, or feel faint.  You have severe chest or abdominal pain.  You vomit repeatedly.  You have trouble breathing. MAKE SURE YOU:   Understand these instructions.  Will watch your condition.  Will get help right away if you are not doing well or get worse. Document Released: 02/12/2005 Document Revised: 06/29/2013 Document Reviewed: 12/05/2012 [facility] Patient Information 2015 [place], [place]. This information is not intended to replace advice given to you by your health care provider. Make sure you discuss any questions you have with your health care provider.

## 2014-06-30 NOTE — MAU Note (Signed)
Was diagnosed with ovarian cancer at age 29 both ovaries were removed.  Former patient of [facility] was terminated due to missed appointments. Knot in vaginal area for 3 days. Not sexually active, denies vaginal discharge.

## 2014-07-01 ENCOUNTER — Telehealth: Payer: Self-pay | Admitting: Physician Assistant

## 2014-07-01 LAB — GC/CHLAMYDIA PROBE AMP (~~LOC~~) NOT AT ARMC
CHLAMYDIA, DNA PROBE: NEGATIVE
Neisseria Gonorrhea: NEGATIVE

## 2014-07-01 MED ORDER — SULFAMETHOXAZOLE-TRIMETHOPRIM 800-160 MG PO TABS: 1.0000 | ORAL_TABLET | Freq: Two times a day (BID) | ORAL | Status: DC

## 2014-07-01 NOTE — Telephone Encounter (Signed)
Patient reports that she was seen yesterday and was told she would receive a prescription for antibiotic and nausea medication but none was provided.   Uncertain if abx was truly intended.  Discussed with Dr. Ihor Dow whom is agreeable to Bactrim bid x 7.  Rx requested to be filled at [facility] on [address].  This is provided for pt.   Documentation from visit yesterday indicates no nausea/vomiting and therefore no nausea medication would have been necessitated.   Pt advised for further eval/treatment, she would need to be seen again.

## 2014-07-02 ENCOUNTER — Encounter: Payer: Self-pay | Admitting: *Deleted

## 2014-07-02 LAB — HERPES SIMPLEX VIRUS CULTURE: CULTURE: NOT DETECTED

## 2014-07-28 ENCOUNTER — Encounter: Payer: Medicaid Other | Admitting: Obstetrics & Gynecology

## 2014-09-16 ENCOUNTER — Encounter (HOSPITAL_COMMUNITY): Payer: Self-pay | Admitting: Emergency Medicine

## 2014-09-16 ENCOUNTER — Emergency Department (HOSPITAL_COMMUNITY)
Admission: EM | Admit: 2014-09-16 | Discharge: 2014-09-16 | Disposition: A | Discharge status: Home / Self Care | Payer: Medicaid Other | Attending: Emergency Medicine | Admitting: Emergency Medicine

## 2014-09-16 DIAGNOSIS — R202 Paresthesia of skin: Secondary | ICD-10-CM

## 2014-09-16 DIAGNOSIS — F419 Anxiety disorder, unspecified: Secondary | ICD-10-CM | POA: Insufficient documentation

## 2014-09-16 DIAGNOSIS — R1110 Vomiting, unspecified: Secondary | ICD-10-CM | POA: Diagnosis not present

## 2014-09-16 DIAGNOSIS — R197 Diarrhea, unspecified: Secondary | ICD-10-CM | POA: Diagnosis not present

## 2014-09-16 DIAGNOSIS — Z3202 Encounter for pregnancy test, result negative: Secondary | ICD-10-CM | POA: Insufficient documentation

## 2014-09-16 DIAGNOSIS — R200 Anesthesia of skin: Secondary | ICD-10-CM | POA: Diagnosis not present

## 2014-09-16 DIAGNOSIS — Z8543 Personal history of malignant neoplasm of ovary: Secondary | ICD-10-CM | POA: Diagnosis not present

## 2014-09-16 DIAGNOSIS — R52 Pain, unspecified: Secondary | ICD-10-CM | POA: Insufficient documentation

## 2014-09-16 DIAGNOSIS — Z79899 Other long term (current) drug therapy: Secondary | ICD-10-CM | POA: Insufficient documentation

## 2014-09-16 DIAGNOSIS — Z87442 Personal history of urinary calculi: Secondary | ICD-10-CM | POA: Insufficient documentation

## 2014-09-16 DIAGNOSIS — F259 Schizoaffective disorder, unspecified: Secondary | ICD-10-CM | POA: Diagnosis not present

## 2014-09-16 DIAGNOSIS — Z8669 Personal history of other diseases of the nervous system and sense organs: Secondary | ICD-10-CM | POA: Diagnosis not present

## 2014-09-16 DIAGNOSIS — Z87891 Personal history of nicotine dependence: Secondary | ICD-10-CM | POA: Diagnosis not present

## 2014-09-16 DIAGNOSIS — Z86011 Personal history of benign neoplasm of the brain: Secondary | ICD-10-CM | POA: Insufficient documentation

## 2014-09-16 DIAGNOSIS — R5381 Other malaise: Secondary | ICD-10-CM

## 2014-09-16 DIAGNOSIS — F329 Major depressive disorder, single episode, unspecified: Secondary | ICD-10-CM | POA: Insufficient documentation

## 2014-09-16 LAB — CBC
HEMATOCRIT: 34.7 % — AB (ref 36.0–46.0)
HEMOGLOBIN: 11.1 g/dL — AB (ref 12.0–15.0)
MCH: 30.2 pg (ref 26.0–34.0)
MCHC: 32 g/dL (ref 30.0–36.0)
MCV: 94.3 fL (ref 78.0–100.0)
Platelets: 387 10*3/uL (ref 150–400)
RBC: 3.68 MIL/uL — ABNORMAL LOW (ref 3.87–5.11)
RDW: 13.3 % (ref 11.5–15.5)
WBC: 8.9 10*3/uL (ref 4.0–10.5)

## 2014-09-16 LAB — COMPREHENSIVE METABOLIC PANEL
ALT: 19 U/L (ref 14–54)
ANION GAP: 7 (ref 5–15)
AST: 23 U/L (ref 15–41)
Albumin: 4 g/dL (ref 3.5–5.0)
Alkaline Phosphatase: 73 U/L (ref 38–126)
BUN: 14 mg/dL (ref 6–20)
CO2: 24 mmol/L (ref 22–32)
CREATININE: 1 mg/dL (ref 0.44–1.00)
Calcium: 9.1 mg/dL (ref 8.9–10.3)
Chloride: 110 mmol/L (ref 101–111)
GFR calc Af Amer: >60 mL/min (ref >60)
GFR calc non Af Amer: >60 mL/min (ref >60)
GLUCOSE: 117 mg/dL — AB (ref 65–99)
Potassium: 3.7 mmol/L (ref 3.5–5.1)
Sodium: 141 mmol/L (ref 135–145)
Total Bilirubin: 0.6 mg/dL (ref 0.3–1.2)
Total Protein: 7.6 g/dL (ref 6.5–8.1)

## 2014-09-16 LAB — URINE MICROSCOPIC-ADD ON

## 2014-09-16 LAB — URINALYSIS, ROUTINE W REFLEX MICROSCOPIC
GLUCOSE, UA: NEGATIVE mg/dL
Hgb urine dipstick: NEGATIVE
Ketones, ur: NEGATIVE mg/dL
NITRITE: NEGATIVE
PROTEIN: NEGATIVE mg/dL
Specific Gravity, Urine: 1.027 (ref 1.005–1.030)
Urobilinogen, UA: 1 mg/dL (ref 0.0–1.0)
pH: 6 (ref 5.0–8.0)

## 2014-09-16 LAB — PREGNANCY, URINE: Preg Test, Ur: NEGATIVE

## 2014-09-16 LAB — LIPASE, BLOOD: Lipase: 26 U/L (ref 22–51)

## 2014-09-16 MED ORDER — ACETAMINOPHEN 325 MG PO TABS
650.0000 mg | ORAL_TABLET | Freq: Once | ORAL | Status: AC
  Administered 2014-09-16: 650 mg via ORAL
  Filled 2014-09-16: qty 2

## 2014-09-16 MED ORDER — ONDANSETRON HCL 4 MG/2ML IJ SOLN
4.0000 mg | Freq: Once | INTRAMUSCULAR | Status: AC
Start: 2014-09-16 — End: 2014-09-16
  Administered 2014-09-16: 4 mg via INTRAVENOUS
  Filled 2014-09-16: qty 2

## 2014-09-16 MED ORDER — SODIUM CHLORIDE 0.9 % IV BOLUS (SEPSIS)
1000.0000 mL | Freq: Once | INTRAVENOUS | Status: AC
  Administered 2014-09-16: 1000 mL via INTRAVENOUS

## 2014-09-16 MED ORDER — ONDANSETRON HCL 4 MG PO TABS: 4.0000 mg | ORAL_TABLET | Freq: Four times a day (QID) | ORAL | Status: DC

## 2014-09-16 NOTE — ED Notes (Addendum)
Pt states that for the past 2 and half weeks been having body aches, bilat hand numbness, diarrhea and vomiting.  Pt states that she was tested for DM at her doctor last year and everything was fine.  Pt states that she saw her PCP last month and everything was fine.

## 2014-09-16 NOTE — ED Provider Notes (Signed)
CSN: [id number]     Arrival date & time 09/16/14  1443 History   First MD Initiated Contact with Patient 09/16/14 1514     Chief Complaint  Patient presents with  . Generalized Body Aches  . Diarrhea  . Emesis  . hand numbness     Patient is a 29 y.o. female presenting with diarrhea and vomiting. The history is provided by the patient. No language interpreter was used.  Diarrhea Associated symptoms: vomiting   Emesis Associated symptoms: diarrhea    Ms. Canady presents for evaluation of multiple complaints.  Primarily she is concerned that she may have diabetes, but she also endorses two weeks of generalized body aches, diarrhea (3-5 watery episodes daily), vomiting (3-4 episodes daily), numbness in bilateral hands/legs.  Sxs started around the same time and are gradually worsening.  She reports chills and sweating spells but no reports fevers.  No chest pain, abdominal pain.  Sxs are moderate, constant, worsening.    Past Medical History  Diagnosis Date  . 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 Brain tumor   . Schizoaffective disorder   . Anxiety   . Depression   . Seizures   . Kidney stones   . Kidney stone   . Cancer 1991    ovarian  . Ovarian cancer    Past Surgical History  Procedure Laterality Date  . Kidney stone removal    . Appendectomy    . Abdominal hysterectomy      partial; took fallopian tubes and ovaries   Family History  Problem Relation Age of Onset  . Diabetes type II Other   . Asthma Mother    History  Substance Use Topics  . Smoking status: Former Smoker -- 0.25 packs/day for 2 years    Types: Cigarettes    Quit date: 07/27/2012  . Smokeless tobacco: Never Used  . Alcohol Use: No   OB History    Gravida Para Term Preterm AB TAB SAB Ectopic Multiple Living   0 0 0 0 0 0 0 0 0 0      Review of Systems  Gastrointestinal: Positive for vomiting and diarrhea.  All other systems reviewed and are negative.     Allergies   Ketorolac tromethamine; Lortab; and Tramadol  Home Medications   Prior to Admission medications   Medication Sig Start Date End Date Taking? Authorizing Provider  QUEtiapine (SEROQUEL) 25 MG tablet Take 25 mg by mouth daily as needed.   Yes Historical Provider, MD  sertraline (ZOLOFT) 100 MG tablet Take 100 mg by mouth 2 (two) times daily.   Yes Historical Provider, MD  hydrochlorothiazide (HYDRODIURIL) 25 MG tablet Take 1 tablet (25 mg total) by mouth daily. Patient not taking: Reported on 09/16/2014 06/30/14   Kathie Dike Leftwich-Kirby, CNM  oxyCODONE-acetaminophen (PERCOCET/ROXICET) 5-325 MG per tablet Take 1-2 tablets by mouth every 6 (six) hours as needed. Patient not taking: Reported on 09/16/2014 06/30/14   Elvera Maria, CNM  Respiratory Therapy Supplies (FLUTTER) DEVI Use as directed Patient not taking: Reported on 09/16/2014 12/04/13   Tanda Rockers, MD  sulfamethoxazole-trimethoprim (BACTRIM DS,SEPTRA DS) 800-160 MG per tablet Take 1 tablet by mouth 2 (two) times daily. Patient not taking: Reported on 09/16/2014 07/01/14   Collene Leyden Teague Clark, PA-C   BP 167/91 mmHg  Pulse 95  Temp(Src) 99 F (37.2 C) (Oral)  Resp 18  SpO2 99% Physical Exam  Constitutional: She is oriented to person, place, and time. She appears  well-developed and well-nourished.  HENT:  Head: Normocephalic and atraumatic.  Cardiovascular: Normal rate and regular rhythm.   No murmur heard. Pulmonary/Chest: Effort normal and breath sounds normal. No respiratory distress.  Abdominal: Soft. There is no tenderness. There is no rebound and no guarding.  Musculoskeletal: She exhibits no edema or tenderness.  Neurological: She is alert and oriented to person, place, and time.  Decreased sensation to light touch in all four extremities.  5/5 strength in all four extremities.    Skin: Skin is warm and dry.  Psychiatric: She has a normal mood and affect. Her behavior is normal.  Nursing note and vitals  reviewed.   ED Course  Procedures (including critical care time) Labs Review Labs Reviewed  COMPREHENSIVE METABOLIC PANEL - Abnormal; Notable for the following:    Glucose, Bld 117 (*)    All other components within normal limits  CBC - Abnormal; Notable for the following:    RBC 3.68 (*)    Hemoglobin 11.1 (*)    HCT 34.7 (*)    All other components within normal limits  URINALYSIS, ROUTINE W REFLEX MICROSCOPIC (NOT AT [facility]) - Abnormal; Notable for the following:    Color, Urine AMBER (*)    APPearance CLOUDY (*)    Bilirubin Urine SMALL (*)    Leukocytes, UA TRACE (*)    All other components within normal limits  URINE MICROSCOPIC-ADD ON - Abnormal; Notable for the following:    Squamous Epithelial / LPF MANY (*)    Bacteria, UA FEW (*)    Crystals CA OXALATE CRYSTALS (*)    All other components within normal limits  LIPASE, BLOOD  PREGNANCY, URINE  POC URINE PREG, ED    Imaging Review No results found.   EKG Interpretation None      MDM   Final diagnoses:  Malaise  Vomiting and diarrhea  Paresthesia    Pt here with multiple complaints, requesting dilaudid for her body aches - discussed with patient that dilaudid is not an appropriate medication for body aches - pt given tylenol.  Pt without vomiting in the department, tolerating oral fluids.  UA not c/w UTI.  Hx and presentation is not c/w CVA, cervical epidural abscess.  Discussed PCP follow up, return precautions.     Quintella Reichert, MD 09/16/14 [phone]

## 2014-09-16 NOTE — ED Notes (Signed)
Pt, again, stating that she needs "strong IV medication" for her body aches.  This RN informed her that we have notified the EDP and Ralene Bathe MD will only be ordering the Tylenol.

## 2014-09-16 NOTE — Discharge Instructions (Signed)
The cause of your symptoms was not identified today.  Follow up with your doctor for further testing.     Fatigue Fatigue is a feeling of tiredness, lack of energy, lack of motivation, or feeling tired all the time. Having enough rest, good nutrition, and reducing stress will normally reduce fatigue. Consult your caregiver if it persists. The nature of your fatigue will help your caregiver to find out its cause. The treatment is based on the cause.  CAUSES  There are many causes for fatigue. Most of the time, fatigue can be traced to one or more of your habits or routines. Most causes fit into one or more of three general areas. They are: Lifestyle problems  Sleep disturbances.  Overwork.  Physical exertion.  Unhealthy habits.  Poor eating habits or eating disorders.  Alcohol and/or drug use .  Lack of proper nutrition (malnutrition). Psychological problems  Stress and/or anxiety problems.  Depression.  Grief.  Boredom. Medical Problems or Conditions  Anemia.  Pregnancy.  Thyroid gland problems.  Recovery from major surgery.  Continuous pain.  Emphysema or asthma that is not well controlled  Allergic conditions.  Diabetes.  Infections (such as mononucleosis).  Obesity.  Sleep disorders, such as sleep apnea.  Heart failure or other heart-related problems.  Cancer.  Kidney disease.  Liver disease.  Effects of certain medicines such as antihistamines, cough and cold remedies, prescription pain medicines, heart and blood pressure medicines, drugs used for treatment of cancer, and some antidepressants. SYMPTOMS  The symptoms of fatigue include:   Lack of energy.  Lack of drive (motivation).  Drowsiness.  Feeling of indifference to the surroundings. DIAGNOSIS  The details of how you feel help guide your caregiver in finding out what is causing the fatigue. You will be asked about your present and past health condition. It is important to review all  medicines that you take, including prescription and non-prescription items. A thorough exam will be done. You will be questioned about your feelings, habits, and normal lifestyle. Your caregiver may suggest blood tests, urine tests, or other tests to look for common medical causes of fatigue.  TREATMENT  Fatigue is treated by correcting the underlying cause. For example, if you have continuous pain or depression, treating these causes will improve how you feel. Similarly, adjusting the dose of certain medicines will help in reducing fatigue.  HOME CARE INSTRUCTIONS   Try to get the required amount of good sleep every night.  Eat a healthy and nutritious diet, and drink enough water throughout the day.  Practice ways of relaxing (including yoga or meditation).  Exercise regularly.  Make plans to change situations that cause stress. Act on those plans so that stresses decrease over time. Keep your work and personal routine reasonable.  Avoid street drugs and minimize use of alcohol.  Start taking a daily multivitamin after consulting your caregiver. SEEK MEDICAL CARE IF:   You have persistent tiredness, which cannot be accounted for.  You have fever.  You have unintentional weight loss.  You have headaches.  You have disturbed sleep throughout the night.  You are feeling sad.  You have constipation.  You have dry skin.  You have gained weight.  You are taking any new or different medicines that you suspect are causing fatigue.  You are unable to sleep at night.  You develop any unusual swelling of your legs or other parts of your body. SEEK IMMEDIATE MEDICAL CARE IF:   You are feeling confused.  Your  vision is blurred.  You feel faint or pass out.  You develop severe headache.  You develop severe abdominal, pelvic, or back pain.  You develop chest pain, shortness of breath, or an irregular or fast heartbeat.  You are unable to pass a normal amount of  urine.  You develop abnormal bleeding such as bleeding from the rectum or you vomit blood.  You have thoughts about harming yourself or committing suicide.  You are worried that you might harm someone else. MAKE SURE YOU:   Understand these instructions.  Will watch your condition.  Will get help right away if you are not doing well or get worse. Document Released: 12/10/2006 Document Revised: 05/07/2011 Document Reviewed: 06/16/2013 [facility] Patient Information 2015 [place], [place]. This information is not intended to replace advice given to you by your health care provider. Make sure you discuss any questions you have with your health care provider.  Nausea and Vomiting Nausea is a sick feeling that often comes before throwing up (vomiting). Vomiting is a reflex where stomach contents come out of your mouth. Vomiting can cause severe loss of body fluids (dehydration). Children and elderly adults can become dehydrated quickly, especially if they also have diarrhea. Nausea and vomiting are symptoms of a condition or disease. It is important to find the cause of your symptoms. CAUSES   Direct irritation of the stomach lining. This irritation can result from increased acid production (gastroesophageal reflux disease), infection, food poisoning, taking certain medicines (such as nonsteroidal anti-inflammatory drugs), alcohol use, or tobacco use.  Signals from the brain.These signals could be caused by a headache, heat exposure, an inner ear disturbance, increased pressure in the brain from injury, infection, a tumor, or a concussion, pain, emotional stimulus, or metabolic problems.  An obstruction in the gastrointestinal tract (bowel obstruction).  Illnesses such as diabetes, hepatitis, gallbladder problems, appendicitis, kidney problems, cancer, sepsis, atypical symptoms of a heart attack, or eating disorders.  Medical treatments such as chemotherapy and radiation.  Receiving medicine  that makes you sleep (general anesthetic) during surgery. DIAGNOSIS Your caregiver may ask for tests to be done if the problems do not improve after a few days. Tests may also be done if symptoms are severe or if the reason for the nausea and vomiting is not clear. Tests may include:  Urine tests.  Blood tests.  Stool tests.  Cultures (to look for evidence of infection).  X-rays or other imaging studies. Test results can help your caregiver make decisions about treatment or the need for additional tests. TREATMENT You need to stay well hydrated. Drink frequently but in small amounts.You may wish to drink water, sports drinks, clear broth, or eat frozen ice pops or gelatin dessert to help stay hydrated.When you eat, eating slowly may help prevent nausea.There are also some antinausea medicines that may help prevent nausea. HOME CARE INSTRUCTIONS   Take all medicine as directed by your caregiver.  If you do not have an appetite, do not force yourself to eat. However, you must continue to drink fluids.  If you have an appetite, eat a normal diet unless your caregiver tells you differently.  Eat a variety of complex carbohydrates (rice, wheat, potatoes, bread), lean meats, yogurt, fruits, and vegetables.  Avoid high-fat foods because they are more difficult to digest.  Drink enough water and fluids to keep your urine clear or pale yellow.  If you are dehydrated, ask your caregiver for specific rehydration instructions. Signs of dehydration may include:  Severe thirst.  Dry lips and mouth.  Dizziness.  Dark urine.  Decreasing urine frequency and amount.  Confusion.  Rapid breathing or pulse. SEEK IMMEDIATE MEDICAL CARE IF:   You have blood or brown flecks (like coffee grounds) in your vomit.  You have black or bloody stools.  You have a severe headache or stiff neck.  You are confused.  You have severe abdominal pain.  You have chest pain or trouble  breathing.  You do not urinate at least once every 8 hours.  You develop cold or clammy skin.  You continue to vomit for longer than 24 to 48 hours.  You have a fever. MAKE SURE YOU:   Understand these instructions.  Will watch your condition.  Will get help right away if you are not doing well or get worse. Document Released: 02/12/2005 Document Revised: 05/07/2011 Document Reviewed: 07/12/2010 [facility] Patient Information 2015 [place], [place]. This information is not intended to replace advice given to you by your health care provider. Make sure you discuss any questions you have with your health care provider.

## 2014-11-25 ENCOUNTER — Encounter (HOSPITAL_COMMUNITY): Payer: Self-pay

## 2014-11-25 ENCOUNTER — Emergency Department (HOSPITAL_COMMUNITY): Payer: Medicaid Other

## 2014-11-25 ENCOUNTER — Emergency Department (HOSPITAL_COMMUNITY)
Admission: EM | Admit: 2014-11-25 | Discharge: 2014-11-26 | Disposition: A | Discharge status: Home / Self Care | Payer: Medicaid Other | Attending: Emergency Medicine | Admitting: Emergency Medicine

## 2014-11-25 DIAGNOSIS — M549 Dorsalgia, unspecified: Secondary | ICD-10-CM | POA: Insufficient documentation

## 2014-11-25 DIAGNOSIS — Z86011 Personal history of benign neoplasm of the brain: Secondary | ICD-10-CM | POA: Diagnosis not present

## 2014-11-25 DIAGNOSIS — E669 Obesity, unspecified: Secondary | ICD-10-CM | POA: Diagnosis not present

## 2014-11-25 DIAGNOSIS — R600 Localized edema: Secondary | ICD-10-CM | POA: Diagnosis not present

## 2014-11-25 DIAGNOSIS — Z8669 Personal history of other diseases of the nervous system and sense organs: Secondary | ICD-10-CM | POA: Insufficient documentation

## 2014-11-25 DIAGNOSIS — F419 Anxiety disorder, unspecified: Secondary | ICD-10-CM | POA: Diagnosis not present

## 2014-11-25 DIAGNOSIS — Z3202 Encounter for pregnancy test, result negative: Secondary | ICD-10-CM | POA: Diagnosis not present

## 2014-11-25 DIAGNOSIS — R200 Anesthesia of skin: Secondary | ICD-10-CM | POA: Diagnosis not present

## 2014-11-25 DIAGNOSIS — Z87442 Personal history of urinary calculi: Secondary | ICD-10-CM | POA: Insufficient documentation

## 2014-11-25 DIAGNOSIS — R300 Dysuria: Secondary | ICD-10-CM | POA: Diagnosis not present

## 2014-11-25 DIAGNOSIS — F209 Schizophrenia, unspecified: Secondary | ICD-10-CM | POA: Insufficient documentation

## 2014-11-25 DIAGNOSIS — Z87891 Personal history of nicotine dependence: Secondary | ICD-10-CM | POA: Insufficient documentation

## 2014-11-25 DIAGNOSIS — F329 Major depressive disorder, single episode, unspecified: Secondary | ICD-10-CM | POA: Insufficient documentation

## 2014-11-25 DIAGNOSIS — Z8543 Personal history of malignant neoplasm of ovary: Secondary | ICD-10-CM | POA: Insufficient documentation

## 2014-11-25 DIAGNOSIS — M25572 Pain in left ankle and joints of left foot: Secondary | ICD-10-CM | POA: Diagnosis not present

## 2014-11-25 LAB — URINALYSIS, ROUTINE W REFLEX MICROSCOPIC
Bilirubin Urine: NEGATIVE
Glucose, UA: NEGATIVE mg/dL
HGB URINE DIPSTICK: NEGATIVE
Ketones, ur: NEGATIVE mg/dL
Leukocytes, UA: NEGATIVE
NITRITE: NEGATIVE
PH: 5.5 (ref 5.0–8.0)
Protein, ur: NEGATIVE mg/dL
SPECIFIC GRAVITY, URINE: 1.024 (ref 1.005–1.030)
Urobilinogen, UA: 1 mg/dL (ref 0.0–1.0)

## 2014-11-25 LAB — BASIC METABOLIC PANEL
Anion gap: 8 (ref 5–15)
BUN: 14 mg/dL (ref 6–20)
CHLORIDE: 108 mmol/L (ref 101–111)
CO2: 26 mmol/L (ref 22–32)
CREATININE: 1.44 mg/dL — AB (ref 0.44–1.00)
Calcium: 9.4 mg/dL (ref 8.9–10.3)
GFR calc non Af Amer: 48 mL/min — ABNORMAL LOW (ref >60)
GFR, EST AFRICAN AMERICAN: 56 mL/min — AB (ref >60)
Glucose, Bld: 85 mg/dL (ref 65–99)
POTASSIUM: 3.7 mmol/L (ref 3.5–5.1)
Sodium: 142 mmol/L (ref 135–145)

## 2014-11-25 LAB — POC URINE PREG, ED: Preg Test, Ur: NEGATIVE

## 2014-11-25 LAB — CBC
HEMATOCRIT: 35.7 % — AB (ref 36.0–46.0)
HEMOGLOBIN: 11.3 g/dL — AB (ref 12.0–15.0)
MCH: 30 pg (ref 26.0–34.0)
MCHC: 31.7 g/dL (ref 30.0–36.0)
MCV: 94.7 fL (ref 78.0–100.0)
PLATELETS: 380 10*3/uL (ref 150–400)
RBC: 3.77 MIL/uL — AB (ref 3.87–5.11)
RDW: 13 % (ref 11.5–15.5)
WBC: 7.8 10*3/uL (ref 4.0–10.5)

## 2014-11-25 MED ORDER — OXYCODONE-ACETAMINOPHEN 5-325 MG PO TABS
2.0000 | ORAL_TABLET | Freq: Once | ORAL | Status: AC
Start: 2014-11-25 — End: 2014-11-25
  Administered 2014-11-25: 2 via ORAL
  Filled 2014-11-25: qty 2

## 2014-11-25 NOTE — ED Notes (Signed)
Pt c/o intermittent R arm numbness, lower back pain, dysuria, bilateral hand swelling w/ intermittent numbness, and bilateral ankle swelling x 2 weeks.  Pain score 9/10.  Swelling noted to hands and ankles.  Pt reports being seen by PCP w/o diagnosis.

## 2014-11-25 NOTE — ED Notes (Signed)
Pt stated that she is not able to urinate. 

## 2014-11-25 NOTE — ED Provider Notes (Signed)
CSN: [id number]     Arrival date & time 11/25/14  1833 History   First MD Initiated Contact with Patient 11/25/14 1849     Chief Complaint  Patient presents with  . Numbness  . Back Pain  . Dysuria  . Edema     HPI   [name] is a 29 y.o. female with a PMH of epilepsy, schizoaffective disorder, anxiety, depression who presents to the ED with low back pain, numbness to right hand, numbness to bilateral feet, and ankle swelling. She states her symptoms have been present since Monday. She denies exacerbating factors. She has not tried anything for symptom relief. She denies headache, lightheadedness, dizziness, chest pain, shortness of breath, abdominal pain, nausea, vomiting, diarrhea, constipation, dysuria, urgency, frequency, weakness, paresthesia, bowel or bladder incontinence, saddle anesthesia, history of IV drug use, anticoagulant use. Patient has a history of ovarian cancer.   Past Medical History  Diagnosis Date  . 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 Brain tumor   . Schizoaffective disorder   . Anxiety   . Depression   . Seizures   . Kidney stones   . Kidney stone   . Cancer 1991    ovarian  . Ovarian cancer    Past Surgical History  Procedure Laterality Date  . Kidney stone removal    . Appendectomy    . Abdominal hysterectomy      partial; took fallopian tubes and ovaries   Family History  Problem Relation Age of Onset  . Diabetes type II Other   . Asthma Mother    Social History  Substance Use Topics  . Smoking status: Former Smoker -- 0.25 packs/day for 2 years    Types: Cigarettes    Quit date: 07/27/2012  . Smokeless tobacco: Never Used  . Alcohol Use: No   OB History    Gravida Para Term Preterm AB TAB SAB Ectopic Multiple Living   0 0 0 0 0 0 0 0 0 0       Review of Systems  Constitutional: Positive for fatigue. Negative for fever and chills.  Respiratory: Negative for shortness of breath.   Cardiovascular: Negative for  chest pain.  Gastrointestinal: Negative for nausea, vomiting, abdominal pain, diarrhea and constipation.  Genitourinary: Negative for dysuria, urgency and frequency.  Musculoskeletal: Positive for myalgias, back pain, joint swelling and arthralgias.  Neurological: Positive for numbness. Negative for dizziness, syncope, weakness, light-headedness and headaches.  All other systems reviewed and are negative.     Allergies  Ketorolac tromethamine; Lortab; and Tramadol  Home Medications   Prior to Admission medications   Medication Sig Start Date End Date Taking? Authorizing Provider  hydrochlorothiazide (HYDRODIURIL) 25 MG tablet Take 1 tablet (25 mg total) by mouth daily. Patient not taking: Reported on 09/16/2014 06/30/14   Lattie Haw A Leftwich-Kirby, CNM  ondansetron (ZOFRAN) 4 MG tablet Take 1 tablet (4 mg total) by mouth every 6 (six) hours. 09/16/14   Quintella Reichert, MD  oxyCODONE-acetaminophen (PERCOCET/ROXICET) 5-325 MG per tablet Take 1-2 tablets by mouth every 6 (six) hours as needed. Patient not taking: Reported on 09/16/2014 06/30/14   Kathie Dike Leftwich-Kirby, CNM  QUEtiapine (SEROQUEL) 25 MG tablet Take 25 mg by mouth daily as needed.    Historical Provider, MD  Respiratory Therapy Supplies (FLUTTER) DEVI Use as directed Patient not taking: Reported on 09/16/2014 12/04/13   Tanda Rockers, MD  sertraline (ZOLOFT) 100 MG tablet Take 100 mg by mouth 2 (two) times daily.  Historical Provider, MD  sulfamethoxazole-trimethoprim (BACTRIM DS,SEPTRA DS) 800-160 MG per tablet Take 1 tablet by mouth 2 (two) times daily. Patient not taking: Reported on 09/16/2014 07/01/14   Collene Leyden Teague Clark, PA-C    BP 161/99 mmHg  Pulse 86  Temp(Src) 98.9 F (37.2 C) (Oral)  Resp 18  SpO2 95% Physical Exam  Constitutional: She is oriented to person, place, and time. No distress.  Obese female in no acute distress.  HENT:  Head: Normocephalic and atraumatic.  Right Ear: External ear normal.  Left Ear:  External ear normal.  Nose: Nose normal.  Mouth/Throat: Uvula is midline, oropharynx is clear and moist and mucous membranes are normal.  Eyes: Conjunctivae, EOM and lids are normal. Pupils are equal, round, and reactive to light. Right eye exhibits no discharge. Left eye exhibits no discharge. No scleral icterus.  Neck: Normal range of motion. Neck supple.  Cardiovascular: Normal rate, regular rhythm, normal heart sounds, intact distal pulses and normal pulses.   Pulmonary/Chest: Effort normal and breath sounds normal. No respiratory distress. She has no wheezes. She has no rales. She exhibits no tenderness.  Abdominal: Soft. Normal appearance and bowel sounds are normal. She exhibits no distension and no mass. There is no tenderness. There is no rigidity, no rebound and no guarding.  Musculoskeletal: Normal range of motion. She exhibits edema and tenderness.  Mild diffuse tenderness to palpation of left ankle with mild edema. Tenderness to palpation of lumbar spine and paraspinal muscles. No palpable step-off or deformity.  Neurological: She is alert and oriented to person, place, and time. She has normal strength and normal reflexes. No cranial nerve deficit. Coordination and gait normal.  Decreased sensation to dorsal aspect of feet bilaterally. Patient ambulates without difficulty.  Skin: Skin is warm, dry and intact. No rash noted. She is not diaphoretic. No erythema. No pallor.  Psychiatric: She has a normal mood and affect. Her speech is normal and behavior is normal. Judgment and thought content normal.  Nursing note and vitals reviewed.   ED Course  Procedures (including critical care time)  Labs Review Labs Reviewed  URINALYSIS, ROUTINE W REFLEX MICROSCOPIC (NOT AT [facility]) - Abnormal; Notable for the following:    APPearance CLOUDY (*)    All other components within normal limits  BASIC METABOLIC PANEL - Abnormal; Notable for the following:    Creatinine, Ser 1.44 (*)    GFR calc  non Af Amer 48 (*)    GFR calc Af Amer 56 (*)    All other components within normal limits  CBC - Abnormal; Notable for the following:    RBC 3.77 (*)    Hemoglobin 11.3 (*)    HCT 35.7 (*)    All other components within normal limits  POC URINE PREG, ED    Imaging Review Dg Lumbar Spine Complete  11/25/2014   CLINICAL DATA:  Back pain.  History of ovarian cancer.  EXAM: LUMBAR SPINE - COMPLETE 4+ VIEW  COMPARISON:  None.  FINDINGS: There is no evidence of lumbar spine fracture. Alignment is normal. Intervertebral disc spaces are maintained.  IMPRESSION: Negative.   Electronically Signed   By: Abelardo Diesel M.D.   On: 11/25/2014 23:30     I have personally reviewed and evaluated these images and lab results as part of my medical decision-making.   EKG Interpretation None      MDM   Final diagnoses:  Back pain, unspecified location  Numbness    29 year old female presents with  low back pain, numbness to right hand, numbness to bilateral feet, and ankle swelling since Monday. Denies headache, lightheadedness, dizziness, chest pain, shortness of breath, abdominal pain, nausea, vomiting, diarrhea, constipation, dysuria, urgency, frequency, weakness, paresthesia, bowel or bladder incontinence, saddle anesthesia, history of IV drug use, anticoagulant use. Patient has a history of ovarian cancer. Patient was seen in the ED on July 21 for numbness and generalized body aches, at that time she was discharged with PCP follow-up.  Patient is afebrile. Vital signs stable. Heart regular rate and rhythm. Lungs clear to auscultation bilaterally. Abdomen soft, nondistended, nontender. Diffuse decreased sensation to right hand, distribution does not follow nerve pattern. Grip strength intact. Full range of motion. Diffuse decreased sensation to dorsal aspect of feet bilaterally. Strength intact. Full range of motion. Distal pulses intact. Mild tenderness to palpation of lumbar spine and paraspinal  muscles. No palpable step-off or deformity. Patient ambulates without difficulty.   CBC with hemoglobin 11.3. BMP with creatinine mildly elevated at 1.44. UA negative for infection or hemoglobin. Urine pregnancy negative. Imaging of lumbar spine obtained, which is negative for fracture or abnormal alignment. Pain treated with percocet in the ED. Patient reports significant symptom improvement s/p pain medicine.  Feel patient is stable for discharge at this time. Strict return precautions discussed. Patient to follow up with PCP. Patient is in agreement with plan.  BP 161/96 mmHg  Pulse 69  Temp(Src) 98.1 F (36.7 C) (Oral)  Resp 24  SpO2 96%    Marella Chimes, PA-C 11/26/14 0249  Virgel Manifold, MD 11/26/14 [phone]

## 2014-11-26 MED ORDER — OXYCODONE-ACETAMINOPHEN 5-325 MG PO TABS: 1.0000 | ORAL_TABLET | ORAL | Status: DC | PRN

## 2014-11-26 MED ORDER — OXYCODONE-ACETAMINOPHEN 5-325 MG PO TABS
1.0000 | ORAL_TABLET | Freq: Once | ORAL | Status: AC
  Administered 2014-11-26: 1 via ORAL
  Filled 2014-11-26: qty 1

## 2014-11-26 NOTE — ED Notes (Signed)
Pt ambulated 50 ft.  Pt complained of left ankle pain.   Pt showed no signs  distress.  Was able to ambulate without assistance.

## 2014-11-26 NOTE — Discharge Instructions (Signed)
1. Medications: pain medicine, usual home medications 2. Treatment: rest, drink plenty of fluids 3. Follow Up: please followup with your primary doctor within the next week for discussion of your diagnoses and further evaluation after today's visit; if you do not have a primary care doctor use the resource guide provided to find one; please return to the ER for high fever, chest pain, shortness of breath, severe abdominal pain, persistent vomiting, difficulty walking, new or worsening symptoms   Back Exercises Back exercises help treat and prevent back injuries. The goal is to increase your strength in your belly (abdominal) and back muscles. These exercises can also help with flexibility. Start these exercises when told by your doctor. HOME CARE Back exercises include: Pelvic Tilt.  Lie on your back with your knees bent. Tilt your pelvis until the lower part of your back is against the floor. Hold this position 5 to 10 sec. Repeat this exercise 5 to 10 times. Knee to Chest.  Pull 1 knee up against your chest and hold for 20 to 30 seconds. Repeat this with the other knee. This may be done with the other leg straight or bent, whichever feels better. Then, pull both knees up against your chest. Sit-Ups or Curl-Ups.  Bend your knees 90 degrees. Start with tilting your pelvis, and do a partial, slow sit-up. Only lift your upper half 30 to 45 degrees off the floor. Take at least 2 to 3 seonds for each sit-up. Do not do sit-ups with your knees out straight. If partial sit-ups are difficult, simply do the above but with only tightening your belly (abdominal) muscles and holding it as told. Hip-Lift.  Lie on your back with your knees flexed 90 degrees. Push down with your feet and shoulders as you raise your hips 2 inches off the floor. Hold for 10 seconds, repeat 5 to 10 times. Back Arches.  Lie on your stomach. Prop yourself up on bent elbows. Slowly press on your hands, causing an arch in your low  back. Repeat 3 to 5 times. Shoulder-Lifts.  Lie face down with arms beside your body. Keep hips and belly pressed to floor as you slowly lift your head and shoulders off the floor. Do not overdo your exercises. Be careful in the beginning. Exercises may cause you some mild back discomfort. If the pain lasts for more than 15 minutes, stop the exercises until you see your doctor. Improvement with exercise for back problems is slow.  Document Released: 03/17/2010 Document Revised: 05/07/2011 Document Reviewed: 12/14/2010 [facility] Patient Information 2015 [place], [place]. This information is not intended to replace advice given to you by your health care provider. Make sure you discuss any questions you have with your health care provider.  Back Pain, Adult Back pain is very common. The pain often gets better over time. The cause of back pain is usually not dangerous. Most people can learn to manage their back pain on their own.  HOME CARE   Stay active. Start with short walks on flat ground if you can. Try to walk farther each day.  Do not sit, drive, or stand in one place for more than 30 minutes. Do not stay in bed.  Do not avoid exercise or work. Activity can help your back heal faster.  Be careful when you bend or lift an object. Bend at your knees, keep the object close to you, and do not twist.  Sleep on a firm mattress. Lie on your side, and bend your knees.  If you lie on your back, put a pillow under your knees.  Only take medicines as told by your doctor.  Put ice on the injured area.  Put ice in a plastic bag.  Place a towel between your skin and the bag.  Leave the ice on for 15-20 minutes, 03-04 times a day for the first 2 to 3 days. After that, you can switch between ice and heat packs.  Ask your doctor about back exercises or massage.  Avoid feeling anxious or stressed. Find good ways to deal with stress, such as exercise. GET HELP RIGHT AWAY IF:   Your pain does not  go away with rest or medicine.  Your pain does not go away in 1 week.  You have new problems.  You do not feel well.  The pain spreads into your legs.  You cannot control when you poop (bowel movement) or pee (urinate).  Your arms or legs feel weak or lose feeling (numbness).  You feel sick to your stomach (nauseous) or throw up (vomit).  You have belly (abdominal) pain.  You feel like you may pass out (faint). MAKE SURE YOU:   Understand these instructions.  Will watch your condition.  Will get help right away if you are not doing well or get worse. Document Released: 08/01/2007 Document Revised: 05/07/2011 Document Reviewed: 06/16/2013 [facility] Patient Information 2015 [place], [place]. This information is not intended to replace advice given to you by your health care provider. Make sure you discuss any questions you have with your health care provider.  Neuropathic Pain We often think that pain has a physical cause. If we get rid of the cause, the pain should go away. Nerves themselves can also cause pain. It is called neuropathic pain, which means nerve abnormality. It may be difficult for the patients who have it and for the treating caregivers. Pain is usually described as acute (short-lived) or chronic (long-lasting). Acute pain is related to the physical sensations caused by an injury. It can last from a few seconds to many weeks, but it usually goes away when normal healing occurs. Chronic pain lasts beyond the typical healing time. With neuropathic pain, the nerve fibers themselves may be damaged or injured. They then send incorrect signals to other pain centers. The pain you feel is real, but the cause is not easy to find.  CAUSES  Chronic pain can result from diseases, such as diabetes and shingles (an infection related to chickenpox), or from trauma, surgery, or amputation. It can also happen without any known injury or disease. The nerves are sending pain messages, even  though there is no identifiable cause for such messages.   Other common causes of neuropathy include diabetes, phantom limb pain, or Regional Pain Syndrome (RPS).  As with all forms of chronic back pain, if neuropathy is not correctly treated, there can be a number of associated problems that lead to a downward cycle for the patient. These include depression, sleeplessness, feelings of fear and anxiety, limited social interaction and inability to do normal daily activities or work.  The most dramatic and mysterious example of neuropathic pain is called "phantom limb syndrome." This occurs when an arm or a leg has been removed because of illness or injury. The brain still gets pain messages from the nerves that originally carried impulses from the missing limb. These nerves now seem to misfire and cause troubling pain.  Neuropathic pain often seems to have no cause. It responds poorly to standard pain  treatment. Neuropathic pain can occur after:  Shingles (herpes zoster virus infection).  A lasting burning sensation of the skin, caused usually by injury to a peripheral nerve.  Peripheral neuropathy which is widespread nerve damage, often caused by diabetes or alcoholism.  Phantom limb pain following an amputation.  Facial nerve problems (trigeminal neuralgia).  Multiple sclerosis.  Reflex sympathetic dystrophy.  Pain which comes with cancer and cancer chemotherapy.  Entrapment neuropathy such as when pressure is put on a nerve such as in carpal tunnel syndrome.  Back, leg, and hip problems (sciatica).  Spine or back surgery.  HIV Infection or AIDS where nerves are infected by viruses. Your caregiver can explain items in the above list which may apply to you. SYMPTOMS  Characteristics of neuropathic pain are:  Severe, sharp, electric shock-like, shooting, lightening-like, knife-like.  Pins and needles sensation.  Deep burning, deep cold, or deep ache.  Persistent numbness,  tingling, or weakness.  Pain resulting from light touch or other stimulus that would not usually cause pain.  Increased sensitivity to something that would normally cause pain, such as a pinprick. Pain may persist for months or years following the healing of damaged tissues. When this happens, pain signals no longer sound an alarm about current injuries or injuries about to happen. Instead, the alarm system itself is not working correctly.  Neuropathic pain may get worse instead of better over time. For some people, it can lead to serious disability. It is important to be aware that severe injury in a limb can occur without a proper, protective pain response.Burns, cuts, and other injuries may go unnoticed. Without proper treatment, these injuries can become infected or lead to further disability. Take any injury seriously, and consult your caregiver for treatment. DIAGNOSIS  When you have a pain with no known cause, your caregiver will probably ask some specific questions:   Do you have any other conditions, such as diabetes, shingles, multiple sclerosis, or HIV infection?  How would you describe your pain? (Neuropathic pain is often described as shooting, stabbing, burning, or searing.)  Is your pain worse at any time of the day? (Neuropathic pain is usually worse at night.)  Does the pain seem to follow a certain physical pathway?  Does the pain come from an area that has missing or injured nerves? (An example would be phantom limb pain.)  Is the pain triggered by minor things such as rubbing against the sheets at night? These questions often help define the type of pain involved. Once your caregiver knows what is happening, treatment can begin. Anticonvulsant, antidepressant drugs, and various pain relievers seem to work in some cases. If another condition, such as diabetes is involved, better management of that disorder may relieve the neuropathic pain.  TREATMENT  Neuropathic pain is  frequently long-lasting and tends not to respond to treatment with narcotic type pain medication. It may respond well to other drugs such as antiseizure and antidepressant medications. Usually, neuropathic problems do not completely go away, but partial improvement is often possible with proper treatment. Your caregivers have large numbers of medications available to treat you. Do not be discouraged if you do not get immediate relief. Sometimes different medications or a combination of medications will be tried before you receive the results you are hoping for. See your caregiver if you have pain that seems to be coming from nowhere and does not go away. Help is available.  SEEK IMMEDIATE MEDICAL CARE IF:   There is a sudden change in  the quality of your pain, especially if the change is on only one side of the body.  You notice changes of the skin, such as redness, black or purple discoloration, swelling, or an ulcer.  You cannot move the affected limbs. Document Released: 11/10/2003 Document Revised: 05/07/2011 Document Reviewed: 11/10/2003 [facility] Patient Information 2015 [place], [place]. This information is not intended to replace advice given to you by your health care provider. Make sure you discuss any questions you have with your health care provider.   Emergency Department Resource Guide 1) Find a Doctor and Pay Out of Pocket Although you won't have to find out who is covered by your insurance plan, it is a good idea to ask around and get recommendations. You will then need to call the office and see if the doctor you have chosen will accept you as a new patient and what types of options they offer for patients who are self-pay. Some doctors offer discounts or will set up payment plans for their patients who do not have insurance, but you will need to ask so you aren't surprised when you get to your appointment.  2) Contact Your Local Health Department Not all health departments have  doctors that can see patients for sick visits, but many do, so it is worth a call to see if yours does. If you don't know where your local health department is, you can check in your phone book. The CDC also has a tool to help you locate your state's health department, and many state websites also have listings of all of their local health departments.  3) Find a [facility] If your illness is not likely to be very severe or complicated, you may want to try a walk in clinic. These are popping up all over the country in pharmacies, drugstores, and shopping centers. They're usually staffed by nurse practitioners or physician assistants that have been trained to treat common illnesses and complaints. They're usually fairly quick and inexpensive. However, if you have serious medical issues or chronic medical problems, these are probably not your best option.  No Primary Care Doctor: - Call Health Connect at  [phone] - they can help you locate a primary care doctor that  accepts your insurance, provides certain services, etc. - Physician Referral Service- [phone]  Chronic Pain Problems: Organization         Address  Phone   Notes  [facility]  [phone] Patients need to be referred by their primary care doctor.   Medication Assistance: Organization         Address  Phone   Notes  [facility] Medication [facility] [address] [place], [place] [address] [phone] --Must be a resident of [facility] -- Must have NO insurance coverage whatsoever (no Medicaid/ Medicare, etc.) -- The pt. MUST have a primary care doctor that directs their care regularly and follows them in the community   Med[facility]ist  [phone]   [employer]  [phone]    Agencies that provide inexpensive medical care: Organization         Address  Phone   Notes  [place]  [phone]   [facility]    [phone]    [facility] [place], [place] [address] [phone]   [place] [address] [place] [phone]   Planned Parenthood    [phone])  [facility]    [phone]   [address], [place] Phone:  [phone], Fax:  [phone] Hours of Operation:  9 am - 6 pm, M-F.  Also accepts Medicaid/Medicare and self-pay.  [facility] for [place] [address], [address], [place] Phone: [phone], Fax: [phone]. Hours of Operation:  8:30 am - 5:30 pm, M-F.  Also accepts Medicaid and self-pay.  [facility] [address], [place] Phone: [phone]   [place], [place], [place] [phone], Ext. [phone] Mondays & Thursdays: 7-9 AM.  First 15 patients are seen on a first come, first serve basis.    [place] Providers:  Organization         Address  Phone   Notes  [facility] [address], [place] [phone] Also accepts self-pay patients.  [facility] [address], [place]  [phone]   [place], [address], [place] [phone]   [facility] [address] [place] [phone]   Lucianne Lei [address], [place]   [phone] Only accepts [place] Access [place] patients after they have their name applied to their card.   Self-Pay (no insurance) in [facility]:  Organization         Address  Phone   Notes  Sickle Cell Patients, [facility] [place] [phone]   [facility] [place] [phone]   [facility] [place]  [place], [place], [place] [phone]   [facility]/Dr. Osei-Bonsu  [address], [place] or [address], [place] [phone] Phone number for both [place] and [place] locations is the same.  Urgent Medical and [facility] [address] [place] [phone]   [facility] [address], [place] or [address] [phone]   [facility] [address] [place] [phone], phone; [phone], fax Sees patients 1st and 3rd Saturday of every month.  Must not qualify for public or private insurance (i.e. Medicaid, Medicare, [place] Health Choice, Veterans' Benefits)  Household income should be no more than 200% of the poverty level The clinic cannot treat you if you are pregnant or think you are pregnant  Sexually transmitted diseases are not treated at the clinic.    Dental Care: Organization         Address  Phone  Notes  [facility] Department of [facility] [place] [phone] Accepts children up to age 78 who are enrolled in [place] or [place]; pregnant women with a Medicaid card; and children who have applied for Medicaid or [place] Health Choice, but were declined, whose parents can pay a reduced fee at time of service.  [facility] Department of [facility]  [address], [place] [phone] Accepts children up to age 40 who are enrolled in [place] or [place]; pregnant women with a Medicaid card; and children who have applied for Medicaid or [place], but were declined, whose parents can pay a reduced fee  at time of service.  [place] Adult Dental Access PROGRAM  [place] [phone] Patients are seen by appointment only. Walk-ins are not accepted. [place] will see patients 45 years of age and older. Monday - Tuesday (8am-5pm) Most Wednesdays (8:30-5pm) $30 per visit, cash only  [facility]  Adult Dental Access PROGRAM  [address], [facility] [phone] Patients are seen by appointment only. Walk-ins are not accepted. [place] will see patients 6 years of age and older. One Wednesday Evening (Monthly: Volunteer Based).  $30 per visit, cash only  [place]  [phone] for adults; Children under age 40, call Graduate Pediatric Dentistry at [phone]. Children aged 67-14, please call [phone] to request a pediatric application.  Dental services are provided in all areas of dental care including fillings, crowns and bridges, complete and partial dentures, implants, gum treatment, root canals, and extractions. Preventive care is also provided. Treatment is provided to both adults and children. Patients are selected via a lottery and there is often a waiting list.   [facility] [address] [place]  [phone] [email or web address]   [facility] [address] [place], [place] [phone], Ext. [phone] Second and Fourth Thursday of each month, opens at 6:30 AM; Clinic ends at 9 AM.  Patients are seen on a first-come first-served basis, and a limited number are seen during each clinic.   [facility]  [address] [place] Danker [place], [place] [phone]   Eligibility Requirements You must have lived in [place], [place], or [place] counties for at least the last three months.   You cannot be eligible for state or federal sponsored [employer], including [employer], [place], or [employer].   You generally cannot be eligible for healthcare insurance through your employer.    How to apply: Eligibility screenings are held every Tuesday and Wednesday afternoon from 1:00 pm until 4:00 pm. You do not need an appointment for the interview!  [facility] [address] [place], [place]   [place]  [place] Department  [place]  [phone]    Behavioral Health Resources in the Community: Intensive Outpatient Programs Organization         Address  Phone  Notes  Sandy Perry. [address] [place], [place] [phone]   [facility] Outpatient [address], [place], [place]   [facility]: [facility] [address] [place], [place]   [place] [address]  [place], [place] or [phone]   Substance Abuse Resources Organization         Address  Phone  Notes  Alcohol and Drug Services  [phone]   [place]  [phone]   [facility] [place]   Chinita Pester  [phone]   Residential & Outpatient Substance Abuse Program  [phone]   Psychological Services Organization         Address  Phone  Notes  [facility] [place]  [place]  [phone]   [place] [address] [place] or [phone]    Mobile Crisis Teams Organization         Address  Phone  Notes  [facility], Mobile Crisis Care Unit  [phone]  [facility]  [address] [place], [place]   [facility] [address] [place] [phone]    Self-Help/Support Groups Organization         Address  Phone             Notes  [facility] of [place] - variety of support groups  [place] Call for more information  Narcotics Anonymous (NA), [facility] [address], [employer] [place]  2 meetings at this location   Special educational needs teacher         Address  Phone  Notes  [facility] [place],    [place]  [phone]   [facility]  [address] [place] [phone], [place], [place]   [place], [place] [phone] Admissions: 8am-3pm M-F  Incentives Substance [place] [address],    [place], [place] [phone]   [facility] [address] [place], [place], [place]   [facility] [address],  [place], [place]   [facility] - Intensive Outpatient [address]een [address], [place], [place]   [facility] [address]  [place], [place] [phone] or [phone]   [facility] ([facility]) [address], [place], [place] Accepts Medicaid  [facility] [place] [address],  [place] [phone] Substance Abuse/Addiction Treatment   [facility] Organization         Address  Phone  Notes  [facility]  [phone]   Domenic Schwab, PhD [address] Arlis [place], [place]   [phone] or [phone]   [place], [place] [phone]   [facility] [address] [place], [place] [phone] Insurance/Medicaid/sponsorship through [facility] and Families [address], [address]                                    [place], [place] [phone] [place] [address], [place] [phone]    Dr. Adele Schilder  [phone]   [facility] Dept. 1) [address] [place] 2) [place] 3)  [place] [address], [place] [phone]  [phone]   [place] [phone] or [phone] (After Hours)

## 2015-03-23 ENCOUNTER — Telehealth: Payer: Self-pay | Admitting: Internal Medicine

## 2015-03-23 NOTE — Telephone Encounter (Signed)
NEEDS OV  LMTCB

## 2015-03-25 NOTE — Telephone Encounter (Signed)
Pt seen in ED at [facility] x 2 weeks for PNA. Was advised upon D/C to follow up with Pulmonary for HFU. Refused to see anyone but Dr [name]. Does not want to see a NP. Pt added on per MW at 3pm on 04/01/15. Nothing further needed.

## 2015-04-01 ENCOUNTER — Inpatient Hospital Stay: Payer: Medicaid Other | Admitting: Internal Medicine

## 2015-06-14 ENCOUNTER — Encounter (HOSPITAL_COMMUNITY): Payer: Self-pay | Admitting: *Deleted

## 2015-06-14 ENCOUNTER — Emergency Department (HOSPITAL_COMMUNITY): Payer: Medicaid Other

## 2015-06-14 ENCOUNTER — Emergency Department (HOSPITAL_COMMUNITY)
Admission: EM | Admit: 2015-06-14 | Discharge: 2015-06-15 | Disposition: A | Discharge status: Home / Self Care | Payer: Medicaid Other | Attending: Emergency Medicine | Admitting: Emergency Medicine

## 2015-06-14 DIAGNOSIS — F329 Major depressive disorder, single episode, unspecified: Secondary | ICD-10-CM | POA: Insufficient documentation

## 2015-06-14 DIAGNOSIS — J111 Influenza due to unidentified influenza virus with other respiratory manifestations: Secondary | ICD-10-CM | POA: Insufficient documentation

## 2015-06-14 DIAGNOSIS — R509 Fever, unspecified: Secondary | ICD-10-CM | POA: Diagnosis not present

## 2015-06-14 DIAGNOSIS — R05 Cough: Secondary | ICD-10-CM | POA: Diagnosis not present

## 2015-06-14 DIAGNOSIS — R059 Cough, unspecified: Secondary | ICD-10-CM

## 2015-06-14 DIAGNOSIS — R69 Illness, unspecified: Secondary | ICD-10-CM

## 2015-06-14 DIAGNOSIS — Z87891 Personal history of nicotine dependence: Secondary | ICD-10-CM | POA: Diagnosis not present

## 2015-06-14 LAB — BASIC METABOLIC PANEL
ANION GAP: 11 (ref 5–15)
BUN: 9 mg/dL (ref 6–20)
CHLORIDE: 101 mmol/L (ref 101–111)
CO2: 26 mmol/L (ref 22–32)
Calcium: 8.8 mg/dL — ABNORMAL LOW (ref 8.9–10.3)
Creatinine, Ser: 0.98 mg/dL (ref 0.44–1.00)
Glucose, Bld: 101 mg/dL — ABNORMAL HIGH (ref 65–99)
POTASSIUM: 3.5 mmol/L (ref 3.5–5.1)
SODIUM: 138 mmol/L (ref 135–145)

## 2015-06-14 LAB — CBC
HCT: 33.5 % — ABNORMAL LOW (ref 36.0–46.0)
Hemoglobin: 11.2 g/dL — ABNORMAL LOW (ref 12.0–15.0)
MCH: 30.4 pg (ref 26.0–34.0)
MCHC: 33.4 g/dL (ref 30.0–36.0)
MCV: 91 fL (ref 78.0–100.0)
Platelets: 314 10*3/uL (ref 150–400)
RBC: 3.68 MIL/uL — AB (ref 3.87–5.11)
RDW: 12.9 % (ref 11.5–15.5)
WBC: 4.8 10*3/uL (ref 4.0–10.5)

## 2015-06-14 MED ORDER — ONDANSETRON 8 MG PO TBDP
8.0000 mg | ORAL_TABLET | Freq: Once | ORAL | Status: AC
  Administered 2015-06-14: 8 mg via ORAL
  Filled 2015-06-14: qty 1

## 2015-06-14 MED ORDER — SODIUM CHLORIDE 0.9 % IV BOLUS (SEPSIS)
1000.0000 mL | Freq: Once | INTRAVENOUS | Status: AC
  Administered 2015-06-14: 1000 mL via INTRAVENOUS

## 2015-06-14 MED ORDER — ACETAMINOPHEN 325 MG PO TABS
650.0000 mg | ORAL_TABLET | Freq: Once | ORAL | Status: AC | PRN
  Administered 2015-06-14: 650 mg via ORAL
  Filled 2015-06-14: qty 2

## 2015-06-14 MED ORDER — BENZONATATE 100 MG PO CAPS
100.0000 mg | ORAL_CAPSULE | Freq: Once | ORAL | Status: AC
  Administered 2015-06-14: 100 mg via ORAL
  Filled 2015-06-14: qty 1

## 2015-06-14 NOTE — ED Notes (Signed)
Pt is unable to give a urine sample 

## 2015-06-14 NOTE — ED Notes (Signed)
Pt states she wants to be d/c'd now. Pt is upset that she is not receiving dilaudid for her chest pain. Pt refused EKG. MD informed pt wants to leave.

## 2015-06-14 NOTE — ED Notes (Addendum)
Pt states that she has had fever, body aches, nausea, vomiting and diarrhea; pt states that she is having chills; pt states that she has not treated her temp since 11am; pt reports decreased appetite and states "I have just been laying in the bed"; pt also c/o bilateral flank pain and swelling to her feet

## 2015-06-14 NOTE — ED Provider Notes (Signed)
CSN: [place]:[id number]     Arrival date & time 06/14/15  1934 History   First MD Initiated Contact with Patient 06/14/15 2110     Chief Complaint  Patient presents with  . Flu Like Symptoms      (Consider location/radiation/quality/duration/timing/severity/associated sxs/prior Treatment) HPI  Pt presenting with c/o cough, fever, body aches which began earlier today. Pt states she took tylenol and ibuprofen earlier today but has been coughing and fever has returned.  No difficulty breathing.  No abdominal pain, vomting or diarrhea.  She also c/o some dysuria.  Pt states she has had decreased energy today.  She did get her flu shot this year.  No vomiting or diarrhea.  There are no other associated systemic symptoms, there are no other alleviating or modifying factors.   Past Medical History  Diagnosis Date  . 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 Brain tumor ([place])   . Schizoaffective disorder   . Anxiety   . Depression   . Seizures ([place])   . Kidney stones   . Kidney stone   . Cancer ([place]) 1991    ovarian  . Ovarian cancer [facility])    Past Surgical History  Procedure Laterality Date  . Kidney stone removal    . Appendectomy    . Abdominal hysterectomy      partial; took fallopian tubes and ovaries   Family History  Problem Relation Age of Onset  . Diabetes type II Other   . Asthma Mother    Social History  Substance Use Topics  . Smoking status: Former Smoker -- 0.25 packs/day for 2 years    Types: Cigarettes    Quit date: 07/27/2012  . Smokeless tobacco: Never Used  . Alcohol Use: No   OB History    Gravida Para Term Preterm AB TAB SAB Ectopic Multiple Living   0 0 0 0 0 0 0 0 0 0      Review of Systems  ROS reviewed and all otherwise negative except for mentioned in HPI    Allergies  Ketorolac tromethamine; Lortab; and Tramadol  Home Medications   Prior to Admission medications   Medication Sig Start Date End Date Taking? Authorizing Provider   hydrochlorothiazide (HYDRODIURIL) 25 MG tablet Take 1 tablet (25 mg total) by mouth daily. Patient not taking: Reported on 09/16/2014 06/30/14   Lattie Haw A Leftwich-Kirby, CNM  olmesartan (BENICAR) 10 mg TABS tablet Take 5-10 mg by mouth 2 (two) times daily. Take 1 tablet (10 mg) in the am and Take 0.5 tablet (5 mg) at bedtime.    Historical Provider, MD  ondansetron (ZOFRAN) 4 MG tablet Take 1 tablet (4 mg total) by mouth every 6 (six) hours. Patient not taking: Reported on 11/25/2014 09/16/14   Quintella Reichert, MD  oxyCODONE-acetaminophen (PERCOCET/ROXICET) 5-325 MG tablet Take 1 tablet by mouth every 4 (four) hours as needed for severe pain. Patient not taking: Reported on 06/14/2015 11/26/14   Marella Chimes, PA-C  Respiratory Therapy Supplies (FLUTTER) DEVI Use as directed Patient not taking: Reported on 09/16/2014 12/04/13   Tanda Rockers, MD  sulfamethoxazole-trimethoprim (BACTRIM DS,SEPTRA DS) 800-160 MG per tablet Take 1 tablet by mouth 2 (two) times daily. Patient not taking: Reported on 09/16/2014 07/01/14   Collene Leyden Teague Clark, PA-C   BP 109/67 mmHg  Pulse 96  Temp(Src) 99.3 F (37.4 C) (Oral)  Resp 18  SpO2 91%  Vitals reviewed Physical Exam  Physical Examination: General appearance - alert, well appearing,  and in no distress Mental status - alert, oriented to person, place, and time Eyes - no conjunctival injection, no scleral icterus Mouth - mucous membranes moist, pharynx normal without lesions Chest - clear to auscultation, no wheezes, rales or rhonchi, symmetric air entry Heart - normal rate, regular rhythm, normal S1, S2, no murmurs, rubs, clicks or gallops Abdomen - soft, nontender, nondistended, no masses or organomegaly Neurological - alert, oriented, normal speech Extremities - peripheral pulses normal, no pedal edema, no clubbing or cyanosis Skin - normal coloration and turgor, no rashes, no suspicious skin lesions noted Psych- labile affect, at times calm and  cooperative, at times angry and belligerent  ED Course  Procedures (including critical care time) Labs Review Labs Reviewed  CBC - Abnormal; Notable for the following:    RBC 3.68 (*)    Hemoglobin 11.2 (*)    HCT 33.5 (*)    All other components within normal limits  BASIC METABOLIC PANEL - Abnormal; Notable for the following:    Glucose, Bld 101 (*)    Calcium 8.8 (*)    All other components within normal limits  URINALYSIS, ROUTINE W REFLEX MICROSCOPIC (NOT AT [facility])  PREGNANCY, URINE    Imaging Review Dg Chest 2 View  06/14/2015  CLINICAL DATA:  Cough, congestion, nausea, vomiting, diarrhea, and fever for 3 days. EXAM: CHEST  2 VIEW COMPARISON:  05/06/2014 FINDINGS: Shallow inspiration. The heart size and mediastinal contours are within normal limits. Both lungs are clear. The visualized skeletal structures are unremarkable. IMPRESSION: No active cardiopulmonary disease. Electronically Signed   By: Lucienne Capers M.D.   On: 06/14/2015 22:36   I have personally reviewed and evaluated these images and lab results as part of my medical decision-making.   EKG Interpretation None      MDM   Final diagnoses:  Influenza-like illness  Cough  Febrile illness    Pt presneting with fever, cough, body aches.  CXR is reassuring.  Pt initially febrile which has improved after tylenol. She was treated symptomatically in the ED.  She has co dysuria as well- awaiting urine sample.  If this is negative then patient will be clear for discharge.    11:36 PM pt demanding to receive dilaudid.  I have discussed with patient that dilaudid is not a treatment for viral/flu like illness.  She continues to ask the nurse for dialudid.  I have given her medication for her cough, IV fluids, toradol, zofran.  She states she will sue the hospital due to not receiving dilaudid.    12:31 AM pt is now calm and cooperative, she continue to request dilaudid due to pain from coughing- I have given her a  tessalon perle. She asks for tylenol #3 - agreed to give 1 tablet.  She states she cannot urinate and is requesting a cath urine- I have encouraged her to give urine sample.    Alfonzo Beers, MD 06/16/15 [phone]

## 2015-06-15 LAB — URINALYSIS, ROUTINE W REFLEX MICROSCOPIC
GLUCOSE, UA: NEGATIVE mg/dL
HGB URINE DIPSTICK: NEGATIVE
Ketones, ur: NEGATIVE mg/dL
Nitrite: NEGATIVE
Protein, ur: 30 mg/dL — AB
Specific Gravity, Urine: 1.033 — ABNORMAL HIGH (ref 1.005–1.030)
pH: 6 (ref 5.0–8.0)

## 2015-06-15 LAB — URINE MICROSCOPIC-ADD ON: RBC / HPF: NONE SEEN RBC/hpf (ref 0–5)

## 2015-06-15 LAB — PREGNANCY, URINE: Preg Test, Ur: NEGATIVE

## 2015-06-15 MED ORDER — OSELTAMIVIR PHOSPHATE 75 MG PO CAPS: 75.0000 mg | ORAL_CAPSULE | Freq: Two times a day (BID) | ORAL | Status: DC

## 2015-06-15 MED ORDER — ACETAMINOPHEN-CODEINE #3 300-30 MG PO TABS
1.0000 | ORAL_TABLET | Freq: Once | ORAL | Status: AC
  Administered 2015-06-15: 1 via ORAL
  Filled 2015-06-15: qty 1

## 2015-06-15 MED ORDER — BENZONATATE 100 MG PO CAPS: 100.0000 mg | ORAL_CAPSULE | Freq: Three times a day (TID) | ORAL | Status: DC

## 2015-06-15 NOTE — ED Notes (Signed)
Pt stated she did not want anyone to touch her and to leave her alone. Pt stated she was going to sue [facility] and to google her last name. Pt stated her uncle use to be the chief police of [place]. Pt refuses to have an EKG done by nurse tech.

## 2015-06-15 NOTE — ED Notes (Signed)
Pt states "I will slap the shit out of you if you do not go and get my doctor" when told by the RN that her MD was no longer here. Pt refused to wait in the lobby till the bus transportation began to run again. Pt given bus pass and encouraged by RN to wait in the lobby.

## 2015-06-15 NOTE — Discharge Instructions (Signed)
Return to the ED with any concerns including difficulty breathing, vomiting and not able to keep down liquids, abdominal pain, fainting, decreased level of alertness/lethargy, or any other alarming symptoms °

## 2015-06-15 NOTE — ED Provider Notes (Signed)
Pending UA Presents with flu-like symptoms Repeatedly requesting Dilaudid - discussed this would not be indicated for her illness.  UA reviewed and found to be negative. Patient discharged per plan of previous treatment team.   Charlann Lange, PA-C 06/15/15 0131  Alfonzo Beers, MD 06/16/15 1911

## 2015-08-30 ENCOUNTER — Encounter (HOSPITAL_COMMUNITY): Payer: Self-pay | Admitting: *Deleted

## 2015-08-30 ENCOUNTER — Emergency Department (HOSPITAL_COMMUNITY)
Admission: EM | Admit: 2015-08-30 | Discharge: 2015-08-31 | Disposition: A | Discharge status: Other Acute Inpatient Hospital | Payer: Medicaid Other | Attending: Emergency Medicine | Admitting: Emergency Medicine

## 2015-08-30 DIAGNOSIS — Z79899 Other long term (current) drug therapy: Secondary | ICD-10-CM | POA: Diagnosis not present

## 2015-08-30 DIAGNOSIS — R45850 Homicidal ideations: Secondary | ICD-10-CM | POA: Insufficient documentation

## 2015-08-30 DIAGNOSIS — F251 Schizoaffective disorder, depressive type: Secondary | ICD-10-CM | POA: Diagnosis present

## 2015-08-30 DIAGNOSIS — Z8543 Personal history of malignant neoplasm of ovary: Secondary | ICD-10-CM | POA: Insufficient documentation

## 2015-08-30 DIAGNOSIS — F259 Schizoaffective disorder, unspecified: Secondary | ICD-10-CM | POA: Insufficient documentation

## 2015-08-30 DIAGNOSIS — F17210 Nicotine dependence, cigarettes, uncomplicated: Secondary | ICD-10-CM | POA: Diagnosis not present

## 2015-08-30 DIAGNOSIS — R45851 Suicidal ideations: Secondary | ICD-10-CM | POA: Diagnosis not present

## 2015-08-30 LAB — COMPREHENSIVE METABOLIC PANEL
ALBUMIN: 4.2 g/dL (ref 3.5–5.0)
ALT: 14 U/L (ref 14–54)
AST: 15 U/L (ref 15–41)
Alkaline Phosphatase: 75 U/L (ref 38–126)
Anion gap: 7 (ref 5–15)
BILIRUBIN TOTAL: 0.4 mg/dL (ref 0.3–1.2)
BUN: 21 mg/dL — AB (ref 6–20)
CHLORIDE: 108 mmol/L (ref 101–111)
CO2: 25 mmol/L (ref 22–32)
Calcium: 9.4 mg/dL (ref 8.9–10.3)
Creatinine, Ser: 1.05 mg/dL — ABNORMAL HIGH (ref 0.44–1.00)
GFR calc Af Amer: >60 mL/min (ref >60)
GFR calc non Af Amer: >60 mL/min (ref >60)
GLUCOSE: 108 mg/dL — AB (ref 65–99)
POTASSIUM: 3.4 mmol/L — AB (ref 3.5–5.1)
Sodium: 140 mmol/L (ref 135–145)
TOTAL PROTEIN: 8 g/dL (ref 6.5–8.1)

## 2015-08-30 LAB — CBC
HEMATOCRIT: 34.5 % — AB (ref 36.0–46.0)
Hemoglobin: 11.6 g/dL — ABNORMAL LOW (ref 12.0–15.0)
MCH: 30.6 pg (ref 26.0–34.0)
MCHC: 33.6 g/dL (ref 30.0–36.0)
MCV: 91 fL (ref 78.0–100.0)
Platelets: 349 10*3/uL (ref 150–400)
RBC: 3.79 MIL/uL — AB (ref 3.87–5.11)
RDW: 12.9 % (ref 11.5–15.5)
WBC: 10 10*3/uL (ref 4.0–10.5)

## 2015-08-30 LAB — ACETAMINOPHEN LEVEL: Acetaminophen (Tylenol), Serum: <10 ug/mL — ABNORMAL LOW (ref 10–30)

## 2015-08-30 LAB — SALICYLATE LEVEL: Salicylate Lvl: <4 mg/dL (ref 2.8–30.0)

## 2015-08-30 LAB — ETHANOL: Alcohol, Ethyl (B): <5 mg/dL (ref <5)

## 2015-08-30 MED ORDER — ALUM & MAG HYDROXIDE-SIMETH 200-200-20 MG/5ML PO SUSP: 30.0000 mL | ORAL | Status: DC | PRN

## 2015-08-30 MED ORDER — ONDANSETRON HCL 4 MG PO TABS: 4.0000 mg | ORAL_TABLET | Freq: Three times a day (TID) | ORAL | Status: DC | PRN

## 2015-08-30 MED ORDER — POTASSIUM CHLORIDE CRYS ER 20 MEQ PO TBCR
40.0000 meq | EXTENDED_RELEASE_TABLET | Freq: Once | ORAL | Status: AC
  Administered 2015-08-31: 40 meq via ORAL
  Filled 2015-08-30: qty 2

## 2015-08-30 MED ORDER — LORAZEPAM 1 MG PO TABS
1.0000 mg | ORAL_TABLET | Freq: Three times a day (TID) | ORAL | Status: DC | PRN
  Administered 2015-08-30: 1 mg via ORAL
  Filled 2015-08-30: qty 1

## 2015-08-30 MED ORDER — NICOTINE 21 MG/24HR TD PT24
21.0000 mg | MEDICATED_PATCH | Freq: Every day | TRANSDERMAL | Status: DC
  Administered 2015-08-31: 21 mg via TRANSDERMAL

## 2015-08-30 MED ORDER — ZOLPIDEM TARTRATE 5 MG PO TABS
5.0000 mg | ORAL_TABLET | Freq: Every evening | ORAL | Status: DC | PRN
  Administered 2015-08-30: 5 mg via ORAL
  Filled 2015-08-30: qty 1

## 2015-08-30 MED ORDER — HYDROCHLOROTHIAZIDE 25 MG PO TABS
25.0000 mg | ORAL_TABLET | Freq: Every day | ORAL | Status: DC
  Administered 2015-08-31: 25 mg via ORAL
  Filled 2015-08-30 (×2): qty 1

## 2015-08-30 NOTE — ED Provider Notes (Addendum)
Complains of feeling suicidal and homicidal. She is not feeling homicidal towards any particular person. Patient is pleasant alert and cooperative. Results for orders placed or performed during the hospital encounter of 08/30/15  Comprehensive metabolic panel  Result Value Ref Range   Sodium 140 135 - 145 mmol/L   Potassium 3.4 (L) 3.5 - 5.1 mmol/L   Chloride 108 101 - 111 mmol/L   CO2 25 22 - 32 mmol/L   Glucose, Bld 108 (H) 65 - 99 mg/dL   BUN 21 (H) 6 - 20 mg/dL   Creatinine, Ser 1.05 (H) 0.44 - 1.00 mg/dL   Calcium 9.4 8.9 - 10.3 mg/dL   Total Protein 8.0 6.5 - 8.1 g/dL   Albumin 4.2 3.5 - 5.0 g/dL   AST 15 15 - 41 U/L   ALT 14 14 - 54 U/L   Alkaline Phosphatase 75 38 - 126 U/L   Total Bilirubin 0.4 0.3 - 1.2 mg/dL   GFR calc non Af Amer >60 >60 mL/min   GFR calc Af Amer >60 >60 mL/min   Anion gap 7 5 - 15  Ethanol  Result Value Ref Range   Alcohol, Ethyl (B) <5 <5 mg/dL  Salicylate level  Result Value Ref Range   Salicylate Lvl 123456 2.8 - 30.0 mg/dL  Acetaminophen level  Result Value Ref Range   Acetaminophen (Tylenol), Serum <10 (L) 10 - 30 ug/mL  cbc  Result Value Ref Range   WBC 10.0 4.0 - 10.5 K/uL   RBC 3.79 (L) 3.87 - 5.11 MIL/uL   Hemoglobin 11.6 (L) 12.0 - 15.0 g/dL   HCT 34.5 (L) 36.0 - 46.0 %   MCV 91.0 78.0 - 100.0 fL   MCH 30.6 26.0 - 34.0 pg   MCHC 33.6 30.0 - 36.0 g/dL   RDW 12.9 11.5 - 15.5 %   Platelets 349 150 - 400 K/uL   No results found.   Orlie Dakin, MD 08/30/15 [id number]  Orlie Dakin, MD 08/30/15 [phone]

## 2015-08-30 NOTE — ED Provider Notes (Signed)
CSN: [id number]     Arrival date & time 08/30/15  2035 History  By signing my name below, I, Georgette Shell, attest that this documentation has been prepared under the direction and in the presence of Domenic Moras, PA-C. Electronically Signed: Georgette Shell, ED Scribe. 08/30/2015. 9:20 PM.    Chief Complaint  Patient presents with  . Homicidal   The history is provided by the patient. No language interpreter was used.    HPI Comments: [name] is a 30 y.o. female with h/o anxiety, depression, suicidal/homocidal ideations,. Bipolar disorder, and PTSD who presents to the Emergency Department complaining of homicidal ideations with no specific plan. Pt feels that she is apathetic and she also has suicidal ideations. Pt has tried to commit suicide before. She reports that her brother was killed one week ago and has made her feel overwhelmed. Pt states she had marijuana and cocaine a couple days ago and smokes a cigarette pack a day. Pt does not drink alcohol. Pt states she cannot sleep, drink, or eat.  Pt has not been taking any of her medications for over 6 months because she thinks she could "manage without her medication".  Pt notes that she's been in psychiatric care since she was a child. Pt denies having any hallucinations lately. Pt is in no physical pain at this time.    Past Medical History  Diagnosis Date  . 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 Brain tumor ([place])   . Schizoaffective disorder   . Anxiety   . Depression   . Seizures ([place])   . Kidney stones   . Kidney stone   . Cancer ([place]) 1991    ovarian  . Ovarian cancer [facility])    Past Surgical History  Procedure Laterality Date  . Kidney stone removal    . Appendectomy    . Abdominal hysterectomy      partial; took fallopian tubes and ovaries   Family History  Problem Relation Age of Onset  . Diabetes type II Other   . Asthma Mother    Social History  Substance Use Topics  . Smoking status: Former  Smoker -- 0.25 packs/day for 2 years    Types: Cigarettes    Quit date: 07/27/2012  . Smokeless tobacco: Never Used  . Alcohol Use: No   OB History    Gravida Para Term Preterm AB TAB SAB Ectopic Multiple Living   0 0 0 0 0 0 0 0 0 0      Review of Systems  Constitutional: Negative for fever.  Neurological: Negative for speech difficulty.  Psychiatric/Behavioral: Positive for suicidal ideas. Negative for hallucinations and agitation.  All other systems reviewed and are negative.     Allergies  Ketorolac tromethamine; Lortab; and Tramadol  Home Medications   Prior to Admission medications   Medication Sig Start Date End Date Taking? Authorizing Provider  benzonatate (TESSALON) 100 MG capsule Take 1 capsule (100 mg total) by mouth every 8 (eight) hours. Patient not taking: Reported on 08/30/2015 06/15/15   Alfonzo Beers, MD  hydrochlorothiazide (HYDRODIURIL) 25 MG tablet Take 1 tablet (25 mg total) by mouth daily. Patient not taking: Reported on 09/16/2014 06/30/14   Lattie Haw A Leftwich-Kirby, CNM  ondansetron (ZOFRAN) 4 MG tablet Take 1 tablet (4 mg total) by mouth every 6 (six) hours. Patient not taking: Reported on 11/25/2014 09/16/14   Quintella Reichert, MD  oseltamivir (TAMIFLU) 75 MG capsule Take 1 capsule (75 mg total) by mouth  every 12 (twelve) hours. Patient not taking: Reported on 08/30/2015 06/15/15   Alfonzo Beers, MD  oxyCODONE-acetaminophen (PERCOCET/ROXICET) 5-325 MG tablet Take 1 tablet by mouth every 4 (four) hours as needed for severe pain. Patient not taking: Reported on 06/14/2015 11/26/14   Marella Chimes, PA-C  Respiratory Therapy Supplies (FLUTTER) DEVI Use as directed Patient not taking: Reported on 09/16/2014 12/04/13   Tanda Rockers, MD  sulfamethoxazole-trimethoprim (BACTRIM DS,SEPTRA DS) 800-160 MG per tablet Take 1 tablet by mouth 2 (two) times daily. Patient not taking: Reported on 09/16/2014 07/01/14   Collene Leyden Teague Clark, PA-C   BP 135/99 mmHg  Pulse 74   Temp(Src) 98.5 F (36.9 C) (Oral)  Resp 18  SpO2 100% Physical Exam  Constitutional: She appears well-developed and well-nourished.  Obese African American female. Tearful, but cooperative.   HENT:  Head: Normocephalic.  Eyes: Conjunctivae are normal.  Cardiovascular: Normal rate, regular rhythm and normal heart sounds.   Pulmonary/Chest: Effort normal and breath sounds normal. No respiratory distress.  Abdominal: Soft. Bowel sounds are normal. She exhibits no distension.  Musculoskeletal: Normal range of motion.  Neurological: She is alert.  Skin: Skin is warm and dry.  Psychiatric: Her speech is normal. Judgment normal. She is withdrawn. Cognition and memory are normal. She exhibits a depressed mood. She expresses homicidal and suicidal ideation.  Nursing note and vitals reviewed.   ED Course  Procedures  DIAGNOSTIC STUDIES: Oxygen Saturation is 100% on RA, normal by my interpretation.    COORDINATION OF CARE: 9:18 PM Discussed treatment plan with pt at bedside which includes lab work and pt agreed to plan.  Labs Review Labs Reviewed  COMPREHENSIVE METABOLIC PANEL - Abnormal; Notable for the following:    Potassium 3.4 (*)    Glucose, Bld 108 (*)    BUN 21 (*)    Creatinine, Ser 1.05 (*)    All other components within normal limits  ACETAMINOPHEN LEVEL - Abnormal; Notable for the following:    Acetaminophen (Tylenol), Serum <10 (*)    All other components within normal limits  CBC - Abnormal; Notable for the following:    RBC 3.79 (*)    Hemoglobin 11.6 (*)    HCT 34.5 (*)    All other components within normal limits  ETHANOL  SALICYLATE LEVEL  URINE RAPID DRUG SCREEN, HOSP PERFORMED   I have personally reviewed and evaluated these lab results as part of my medical decision-making.   MDM   Final diagnoses:  Passive suicidal ideations  Homicidal ideation    BP 135/99 mmHg  Pulse 74  Temp(Src) 98.5 F (36.9 C) (Oral)  Resp 18  SpO2 100%   I personally  performed the services described in this documentation, which was scribed in my presence. The recorded information has been reviewed and is accurate.     10:48 PM Patient here for evaluation of passive suicidal and homicidal ideation after losing her brother due to gun violence. She is here voluntarily. No medical condition identified that would preclude her from further psychiatric assessment.  Appreciate consultation from [facility], who felt pt will need inpt psych management. Care discussed with Dr. Winfred Leeds.     Domenic Moras, PA-C 08/30/15 2252  Orlie Dakin, MD 08/30/15 [phone]
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
## 2015-08-30 NOTE — ED Notes (Signed)
Bed: [facility]42 Expected date:  Expected time:  Means of arrival:  Comments: Triage 3 

## 2015-08-31 ENCOUNTER — Inpatient Hospital Stay
Admission: RE | Admit: 2015-08-31 | Discharge: 2015-09-02 | DRG: 885 | Disposition: A | Discharge status: Home / Self Care | Payer: Medicaid Other | Source: Intra-hospital | Attending: Psychiatry | Admitting: Psychiatry

## 2015-08-31 DIAGNOSIS — Z90711 Acquired absence of uterus with remaining cervical stump: Secondary | ICD-10-CM

## 2015-08-31 DIAGNOSIS — G4700 Insomnia, unspecified: Secondary | ICD-10-CM | POA: Diagnosis present

## 2015-08-31 DIAGNOSIS — Z8543 Personal history of malignant neoplasm of ovary: Secondary | ICD-10-CM | POA: Diagnosis not present

## 2015-08-31 DIAGNOSIS — G4733 Obstructive sleep apnea (adult) (pediatric): Secondary | ICD-10-CM | POA: Diagnosis present

## 2015-08-31 DIAGNOSIS — Z833 Family history of diabetes mellitus: Secondary | ICD-10-CM | POA: Diagnosis not present

## 2015-08-31 DIAGNOSIS — G40409 Other generalized epilepsy and epileptic syndromes, not intractable, without status epilepticus: Secondary | ICD-10-CM | POA: Diagnosis present

## 2015-08-31 DIAGNOSIS — R45851 Suicidal ideations: Secondary | ICD-10-CM

## 2015-08-31 DIAGNOSIS — F17200 Nicotine dependence, unspecified, uncomplicated: Secondary | ICD-10-CM | POA: Diagnosis present

## 2015-08-31 DIAGNOSIS — F4310 Post-traumatic stress disorder, unspecified: Secondary | ICD-10-CM | POA: Diagnosis present

## 2015-08-31 DIAGNOSIS — Z87442 Personal history of urinary calculi: Secondary | ICD-10-CM

## 2015-08-31 DIAGNOSIS — F17210 Nicotine dependence, cigarettes, uncomplicated: Secondary | ICD-10-CM | POA: Diagnosis present

## 2015-08-31 DIAGNOSIS — Z9049 Acquired absence of other specified parts of digestive tract: Secondary | ICD-10-CM

## 2015-08-31 DIAGNOSIS — Z9119 Patient's noncompliance with other medical treatment and regimen: Secondary | ICD-10-CM

## 2015-08-31 DIAGNOSIS — Z915 Personal history of self-harm: Secondary | ICD-10-CM

## 2015-08-31 DIAGNOSIS — Z888 Allergy status to other drugs, medicaments and biological substances status: Secondary | ICD-10-CM | POA: Diagnosis not present

## 2015-08-31 DIAGNOSIS — R45850 Homicidal ideations: Secondary | ICD-10-CM | POA: Diagnosis present

## 2015-08-31 DIAGNOSIS — F251 Schizoaffective disorder, depressive type: Secondary | ICD-10-CM

## 2015-08-31 DIAGNOSIS — Z825 Family history of asthma and other chronic lower respiratory diseases: Secondary | ICD-10-CM

## 2015-08-31 DIAGNOSIS — I10 Essential (primary) hypertension: Secondary | ICD-10-CM | POA: Diagnosis present

## 2015-08-31 DIAGNOSIS — F1220 Cannabis dependence, uncomplicated: Secondary | ICD-10-CM | POA: Diagnosis present

## 2015-08-31 DIAGNOSIS — W19XXXA Unspecified fall, initial encounter: Secondary | ICD-10-CM

## 2015-08-31 LAB — RAPID URINE DRUG SCREEN, HOSP PERFORMED
AMPHETAMINES: NOT DETECTED
BENZODIAZEPINES: NOT DETECTED
Barbiturates: NOT DETECTED
Cocaine: NOT DETECTED
OPIATES: NOT DETECTED
TETRAHYDROCANNABINOL: POSITIVE — AB

## 2015-08-31 LAB — PREGNANCY, URINE: PREG TEST UR: NEGATIVE

## 2015-08-31 MED ORDER — HYDROXYZINE HCL 25 MG PO TABS: 25.0000 mg | ORAL_TABLET | Freq: Four times a day (QID) | ORAL | Status: DC | PRN

## 2015-08-31 MED ORDER — MAGNESIUM HYDROXIDE 400 MG/5ML PO SUSP: 30.0000 mL | Freq: Every day | ORAL | Status: DC | PRN

## 2015-08-31 MED ORDER — HYDROXYZINE HCL 25 MG PO TABS
25.0000 mg | ORAL_TABLET | Freq: Four times a day (QID) | ORAL | Status: DC | PRN
  Administered 2015-09-01 – 2015-09-02 (×2): 25 mg via ORAL
  Filled 2015-08-31 (×2): qty 1

## 2015-08-31 MED ORDER — HALOPERIDOL 1 MG PO TABS: 1.0000 mg | ORAL_TABLET | Freq: Two times a day (BID) | ORAL | Status: DC

## 2015-08-31 MED ORDER — FLUCONAZOLE 150 MG PO TABS
150.0000 mg | ORAL_TABLET | Freq: Once | ORAL | Status: AC
  Administered 2015-08-31: 150 mg via ORAL
  Filled 2015-08-31: qty 1

## 2015-08-31 MED ORDER — OLANZAPINE 10 MG PO TBDP: 10.0000 mg | ORAL_TABLET | Freq: Three times a day (TID) | ORAL | Status: DC | PRN

## 2015-08-31 MED ORDER — HYDROCHLOROTHIAZIDE 25 MG PO TABS
25.0000 mg | ORAL_TABLET | Freq: Every day | ORAL | Status: DC
  Administered 2015-09-01 – 2015-09-02 (×2): 25 mg via ORAL
  Filled 2015-08-31 (×2): qty 1

## 2015-08-31 MED ORDER — OLANZAPINE 5 MG PO TBDP: 10.0000 mg | ORAL_TABLET | Freq: Three times a day (TID) | ORAL | Status: DC | PRN

## 2015-08-31 MED ORDER — BENZTROPINE MESYLATE 1 MG PO TABS
0.5000 mg | ORAL_TABLET | Freq: Two times a day (BID) | ORAL | Status: DC
  Administered 2015-08-31 – 2015-09-01 (×2): 0.5 mg via ORAL
  Filled 2015-08-31 (×2): qty 1

## 2015-08-31 MED ORDER — TRAZODONE HCL 100 MG PO TABS: 100.0000 mg | ORAL_TABLET | Freq: Every day | ORAL | Status: DC

## 2015-08-31 MED ORDER — ALUM & MAG HYDROXIDE-SIMETH 200-200-20 MG/5ML PO SUSP: 30.0000 mL | ORAL | Status: DC | PRN

## 2015-08-31 MED ORDER — TRAZODONE HCL 100 MG PO TABS
100.0000 mg | ORAL_TABLET | Freq: Every day | ORAL | Status: DC
Start: 2015-08-31 — End: 2015-09-02
  Administered 2015-08-31 – 2015-09-01 (×2): 100 mg via ORAL
  Filled 2015-08-31 (×2): qty 1

## 2015-08-31 MED ORDER — BENZTROPINE MESYLATE 1 MG PO TABS
0.5000 mg | ORAL_TABLET | Freq: Two times a day (BID) | ORAL | Status: DC
  Administered 2015-08-31: 0.5 mg via ORAL
  Filled 2015-08-31: qty 1

## 2015-08-31 MED ORDER — NICOTINE 21 MG/24HR TD PT24
21.0000 mg | MEDICATED_PATCH | Freq: Every day | TRANSDERMAL | Status: DC
  Administered 2015-09-01 – 2015-09-02 (×2): 21 mg via TRANSDERMAL
  Filled 2015-08-31 (×2): qty 1

## 2015-08-31 MED ORDER — ONDANSETRON HCL 4 MG PO TABS: 4.0000 mg | ORAL_TABLET | Freq: Three times a day (TID) | ORAL | Status: DC | PRN

## 2015-08-31 MED ORDER — BENZTROPINE MESYLATE 1 MG PO TABS
1.0000 mg | ORAL_TABLET | Freq: Once | ORAL | Status: AC
  Administered 2015-08-31: 1 mg via ORAL
  Filled 2015-08-31: qty 1

## 2015-08-31 MED ORDER — HALOPERIDOL 2 MG PO TABS
2.0000 mg | ORAL_TABLET | Freq: Two times a day (BID) | ORAL | Status: DC
  Administered 2015-08-31: 2 mg via ORAL
  Filled 2015-08-31: qty 1

## 2015-08-31 NOTE — ED Notes (Signed)
Pt c/o vaginal itching and vaginal odor. Pt reports she has a yeast infection. EDP notified.

## 2015-08-31 NOTE — ED Notes (Signed)
Pt denies involuntary hand tremors at this time. No tremors observed. Will continue to monitor.

## 2015-08-31 NOTE — ED Notes (Addendum)
Pt c/o right hand shaking. Involuntary Right hand tremor observed. Tremor noted when pt picked up cup. Pt reports right hand shaking just started. Donzetta Sprung Lord,NP notified.

## 2015-08-31 NOTE — BH Assessment (Signed)
[facility] Assessment Progress Note  Per Corena Pilgrim, MD, this pt requires psychiatric hospitalization at this time.  At 15:25 Elmyra Ricks calls from [facility].  Pt has been accepted to their facility by Dr Jerilee Hoh to Rm [id number]; they will be ready to receive pt after 19:00.  Pt has signed Voluntary Admission and Consent for Treatment, as well as Consent to Release Information to [facility], her outpatient provider, and a notification call has been placed.  Signed forms have been faxed to [phone].  Pt's nurse, Caryl Pina, has been notified, and agrees to send original paperwork along with pt via Betsy Pries, and to call report to [phone].  Jalene Mullet, [place] Triage Specialist [phone]
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
## 2015-08-31 NOTE — ED Notes (Signed)
Pt transported to [facility] by [facility] transportation service for continuation of specialized care. Belongings given to driver after patient signed for them. Pt left in no acute distress. 
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
## 2015-09-01 ENCOUNTER — Inpatient Hospital Stay: Payer: Medicaid Other

## 2015-09-01 ENCOUNTER — Encounter: Payer: Self-pay | Admitting: Psychiatry

## 2015-09-01 DIAGNOSIS — F1220 Cannabis dependence, uncomplicated: Secondary | ICD-10-CM | POA: Diagnosis present

## 2015-09-01 DIAGNOSIS — F251 Schizoaffective disorder, depressive type: Principal | ICD-10-CM

## 2015-09-01 DIAGNOSIS — F17200 Nicotine dependence, unspecified, uncomplicated: Secondary | ICD-10-CM | POA: Diagnosis present

## 2015-09-01 LAB — GLUCOSE, CAPILLARY: Glucose-Capillary: 120 mg/dL — ABNORMAL HIGH (ref 65–99)

## 2015-09-01 MED ORDER — ONDANSETRON HCL 4 MG PO TABS: 4.0000 mg | ORAL_TABLET | Freq: Three times a day (TID) | ORAL | Status: DC | PRN

## 2015-09-01 MED ORDER — QUETIAPINE FUMARATE 100 MG PO TABS
100.0000 mg | ORAL_TABLET | Freq: Every day | ORAL | Status: DC
  Administered 2015-09-01: 100 mg via ORAL
  Filled 2015-09-01: qty 1

## 2015-09-01 MED ORDER — ONDANSETRON HCL 4 MG/2ML IJ SOLN
4.0000 mg | Freq: Once | INTRAMUSCULAR | Status: AC
  Administered 2015-09-02: 4 mg via INTRAVENOUS
  Filled 2015-09-01: qty 2

## 2015-09-01 MED ORDER — LIDOCAINE 5 % EX PTCH
1.0000 | MEDICATED_PATCH | CUTANEOUS | Status: DC
  Administered 2015-09-01: 1 via TRANSDERMAL
  Filled 2015-09-01 (×2): qty 1

## 2015-09-01 MED ORDER — PNEUMOCOCCAL VAC POLYVALENT 25 MCG/0.5ML IJ INJ: 0.5000 mL | INJECTION | INTRAMUSCULAR | Status: DC

## 2015-09-01 MED ORDER — GI COCKTAIL ~~LOC~~
30.0000 mL | Freq: Once | ORAL | Status: AC
  Administered 2015-09-02: 30 mL via ORAL
  Filled 2015-09-01: qty 30

## 2015-09-01 MED ORDER — SERTRALINE HCL 50 MG PO TABS
50.0000 mg | ORAL_TABLET | Freq: Every day | ORAL | Status: DC
  Administered 2015-09-01 – 2015-09-02 (×2): 50 mg via ORAL
  Filled 2015-09-01 (×2): qty 1

## 2015-09-01 MED ORDER — PRAZOSIN HCL 2 MG PO CAPS
2.0000 mg | ORAL_CAPSULE | Freq: Two times a day (BID) | ORAL | Status: DC
  Administered 2015-09-01 – 2015-09-02 (×3): 2 mg via ORAL
  Filled 2015-09-01 (×3): qty 1

## 2015-09-01 MED ORDER — ARIPIPRAZOLE 10 MG PO TABS
10.0000 mg | ORAL_TABLET | Freq: Every day | ORAL | Status: DC
  Administered 2015-09-01 – 2015-09-02 (×2): 10 mg via ORAL
  Filled 2015-09-01 (×2): qty 1
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
## 2015-09-01 NOTE — [facility] Group Notes (Signed)
[facility] LCSW Aftercare Discharge Planning Group Note  09/01/2015 1PM  Participation Quality: Did Not Attend. Patient invited to participate but declined.   Alphonse Guild. Maile Linford, MSW, LCSWA, LCAS
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
## 2015-09-01 NOTE — Tx Team (Addendum)
Initial Interdisciplinary Treatment Plan   PATIENT STRESSORS: Loss of brother Medication change or noncompliance   PATIENT STRENGTHS: Ability for insight Active sense of humor Average or above average intelligence Communication skills   PROBLEM LIST: Problem List/Patient Goals Date to be addressed Date deferred Reason deferred Estimated date of resolution  Suicidal ideation 09/01/15     Anxiety 09/01/15     depression 09/01/15     Visual Hallucination 09/01/15     "Get back on my medicine" 09/01/15                              DISCHARGE CRITERIA:  Improved stabilization in mood, thinking, and/or behavior  PRELIMINARY DISCHARGE PLAN: Outpatient therapy  PATIENT/FAMIILY INVOLVEMENT: This treatment plan has been presented to and reviewed with the patient, [name].  The patient and family have been given the opportunity to ask questions and make suggestions.  Laverle Patter Athelene Hursey 09/01/2015, 3:38 AM
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
## 2015-09-01 NOTE — BHH Group Notes (Signed)
[facility] LCSW Group Therapy   09/01/2015 9:15 am   Type of Therapy: Group Therapy   Participation Level: Active   Participation Quality: Attentive, Sharing and Supportive   Affect: Appropriate   Cognitive: Alert and Oriented   Insight: Developing/Improving and Engaged   Engagement in Therapy: Developing/Improving and Engaged   Modes of Intervention: Clarification, Confrontation, Discussion, Education, Exploration, Limit-setting, Orientation, Problem-solving, Rapport Building, Art therapist, Socialization and Support   Summary of Progress/Problems: The topic for group was balance in life. Today's group focused on defining balance in one's own words, identifying things that can knock one off balance, and exploring healthy ways to maintain balance in life. Group members were asked to provide an example of a time when they felt off balance, describe how they handled that situation, and process healthier ways to regain balance in the future. Group members were asked to share the most important tool for maintaining balance that they learned while at [facility] and how they plan to apply this method after discharge. Pt shared that the pt maintained balance in the pt's life by spreading positivity to the people around the pt, rather than negativity.  Pt shared an instance in which the pt felt off balance after being admitted and instead of reacting negatively the pt left the group until the pt felt positive about the situation and then rejoined the group the pt described being a part of.  Pt describe the pt's most valuable tool as positively affecting the world around the pt.  Pt was polite and cooperative with the CSW and other group members and focused and attentive to the topics discussed and the sharing of others.  Alphonse Guild. Airon Sahni, LCSWA, LCAS 09/01/15
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
## 2015-09-01 NOTE — Progress Notes (Signed)
D: Upset this am   Due her being on a heart  healthy diet . Patient  Cursed  At staff  Because she wanted a bacon sandwich.  Instructed  Patient when her doctor get's on the unit  This can change.  Patient continue through out the shift  With complaints of not getting her medications.  Attended unit programing   Stated she was  Not going to comply with the other  Unit activities. Patient took all her medications. A: Writer instructed on discharge criteria  . Informed of 7 day supply  Of medication  FL2 and prescriptions  given to group home staff . Aware  Of follow up appointment . R: Patient left unit with no questions  Or concerns  With group home staff.

## 2015-09-01 NOTE — Progress Notes (Addendum)
D: Pt was standing in the med room with walker. Pt having conversation with Probation officer A&Ox4. Pt stated that she felt dizzy, and immediately fell back on the floor. Fall was witnessed, and pt fell back on to floor and did not hit anything on the way down to the floor. Writer uncertain if head hit floor, but certainly possible. Pt has LOC for approximately 30 seconds, then opened eyes.  Pt disoriented, mute, responded to verbal stimuli with movement and eyes open, but would not verbalize and not appropriately following directions. Pt would not get up off the floor. Pt having possible tremors, but no sign of seizure. Pt pupil reaction sluggish but present. Pt extremities weak A: Vital signs checked. Neuro check performed. Head checked for injury. Doctor called. Nurse Supervisor called. Pt mother called. Pt lifted to bed via hoyer lift and taken to CT per doctor order. Pt returned from CT and assessed by hospitalist. Post fall flowsheet completed. Fall huddle and documentation completed. Safety zone completed. R: Pt initially was selectively mute with hospitalist during assessment, but would strongly respond to pain. Pt then started to whisper. When neuro check performed at [place], pt was verbal but indicated that she had some amnesia in regards to events that occurred post fall. Neuro check at 0200 showed only weakness in extremities, but no other neurological abnormalities. Pt voice is clear, no longer whispering. No sign of injury from fall.

## 2015-09-01 NOTE — [facility]HH Suicide Risk Assessment (Addendum)
[facility] Discharge Suicide Risk Assessment   Principal Problem: Schizoaffective disorder, depressive type [facility]) Discharge Diagnoses:  Patient Active Problem List   Diagnosis Date Noted  . Tobacco use disorder [F17.200] 09/01/2015  . Cannabis use disorder, moderate, dependence ([place]) [F12.20] 09/01/2015  . OSA (obstructive sleep apnea) [G47.33] 10/07/2013  . Upper airway cough syndrome [R05] 09/03/2013  . Essential hypertension [I10] 09/03/2013  . Schizoaffective disorder, depressive type ([place]) [F25.1] 02/22/2011    Total Time spent with patient: 30 minutes  Musculoskeletal: Strength & Muscle Tone: within normal limits Gait & Station: normal Patient leans: N/A  Psychiatric Specialty Exam: Review of Systems  All other systems reviewed and are negative.   Blood pressure 125/67, pulse 78, temperature 98 F (36.7 C), temperature source Oral, resp. rate 18, height 5' 5.75" (1.67 m), weight 113.626 kg (250 lb 8 oz), SpO2 100 %.Body mass index is 40.74 kg/(m^2).  General Appearance: Casual  Eye Contact::  Good  Speech:  Clear and [id number]  Volume:  Normal  Mood:  Anxious  Affect:  Appropriate  Thought Process:  Goal Directed  Orientation:  Full (Time, Place, and Person)  Thought Content:  WDL  Suicidal Thoughts:  No  Homicidal Thoughts:  No  Memory:  Immediate;   Fair Recent;   Fair Remote;   Fair  Judgement:  Impaired  Insight:  Shallow  Psychomotor Activity:  Normal  Concentration:  Fair  Recall:  [place]  Language: Fair  Akathisia:  No  Handed:  Right  AIMS (if indicated):     Assets:  Communication Skills Desire for Improvement Financial Resources/Insurance Housing Resilience Social Support  Sleep:  Number of Hours: 5.75  Cognition: WNL  ADL's:  Intact   Mental Status Per Nursing Assessment::   On Admission:  NA (denies)  Demographic Factors:  Living alone  Loss Factors: NA  Historical Factors: Prior suicide attempts and  Impulsivity  Risk Reduction Factors:   Sense of responsibility to family, Positive social support and Positive therapeutic relationship  Continued Clinical Symptoms:  Bipolar Disorder:   Depressive phase Schizophrenia:   Depressive state Less than 68 years old  Cognitive Features That Contribute To Risk:  None    Suicide Risk:  Minimal: No identifiable suicidal ideation.  Patients presenting with no risk factors but with morbid ruminations; may be classified as minimal risk based on the severity of the depressive symptoms  Follow-up Information    Follow up with [facility]  On 09/14/2015.   Why:  Your hospital follow up appointment is on Wednesday July 19th at 3:00pm.    Contact information:   [id number] [place], [place] Phone: [phone] Fax: [phone]      Plan Of Care/Follow-up recommendations:  Activity:  as tolerated. Diet:  low sodium hert healthy. Other:  keep follow up appointments.  Orson Slick, MD 09/01/2015, 11:05 PM

## 2015-09-01 NOTE — Plan of Care (Signed)
Problem: Education: Goal: Knowledge of the prescribed therapeutic regimen will improve Outcome: Progressing Pt verbalized knowledge of medication regimen, and acknowledged the importance of following it post discharge.

## 2015-09-02 LAB — URINALYSIS COMPLETE WITH MICROSCOPIC (ARMC ONLY)
BILIRUBIN URINE: NEGATIVE
GLUCOSE, UA: NEGATIVE mg/dL
Hgb urine dipstick: NEGATIVE
KETONES UR: NEGATIVE mg/dL
Leukocytes, UA: NEGATIVE
Nitrite: NEGATIVE
Protein, ur: NEGATIVE mg/dL
SPECIFIC GRAVITY, URINE: 1.006 (ref 1.005–1.030)
pH: 7 (ref 5.0–8.0)

## 2015-09-02 LAB — COMPREHENSIVE METABOLIC PANEL
ALBUMIN: 4.2 g/dL (ref 3.5–5.0)
ALK PHOS: 83 U/L (ref 38–126)
ALT: 14 U/L (ref 14–54)
ANION GAP: 8 (ref 5–15)
AST: 18 U/L (ref 15–41)
BUN: 17 mg/dL (ref 6–20)
CALCIUM: 9.5 mg/dL (ref 8.9–10.3)
CO2: 26 mmol/L (ref 22–32)
Chloride: 105 mmol/L (ref 101–111)
Creatinine, Ser: 1 mg/dL (ref 0.44–1.00)
GFR calc non Af Amer: >60 mL/min (ref >60)
Glucose, Bld: 116 mg/dL — ABNORMAL HIGH (ref 65–99)
POTASSIUM: 3.7 mmol/L (ref 3.5–5.1)
SODIUM: 139 mmol/L (ref 135–145)
TOTAL PROTEIN: 7.7 g/dL (ref 6.5–8.1)
Total Bilirubin: 0.7 mg/dL (ref 0.3–1.2)

## 2015-09-02 LAB — CBC
HCT: 34.5 % — ABNORMAL LOW (ref 35.0–47.0)
HEMOGLOBIN: 11.9 g/dL — AB (ref 12.0–16.0)
MCH: 31.4 pg (ref 26.0–34.0)
MCHC: 34.5 g/dL (ref 32.0–36.0)
MCV: 90.9 fL (ref 80.0–100.0)
Platelets: 324 10*3/uL (ref 150–440)
RBC: 3.79 MIL/uL — ABNORMAL LOW (ref 3.80–5.20)
RDW: 13.1 % (ref 11.5–14.5)
WBC: 7.3 10*3/uL (ref 3.6–11.0)

## 2015-09-02 LAB — LIPID PANEL
CHOL/HDL RATIO: 4 ratio
Cholesterol: 149 mg/dL (ref 0–200)
HDL: 37 mg/dL — ABNORMAL LOW (ref >40)
LDL CALC: 96 mg/dL (ref 0–99)
Triglycerides: 80 mg/dL (ref <150)
VLDL: 16 mg/dL (ref 0–40)

## 2015-09-02 LAB — TROPONIN I

## 2015-09-02 LAB — TSH: TSH: 0.755 u[IU]/mL (ref 0.350–4.500)

## 2015-09-02 MED ORDER — TRAZODONE HCL 100 MG PO TABS: 100.0000 mg | ORAL_TABLET | Freq: Every evening | ORAL | Status: DC | PRN

## 2015-09-02 MED ORDER — HYDROCHLOROTHIAZIDE 25 MG PO TABS: 25.0000 mg | ORAL_TABLET | Freq: Every day | ORAL | Status: DC

## 2015-09-02 MED ORDER — LIDOCAINE 5 % EX PTCH
1.0000 | MEDICATED_PATCH | CUTANEOUS | Status: DC
Start: 2015-09-02 — End: 2015-09-02

## 2015-09-02 MED ORDER — ARIPIPRAZOLE 10 MG PO TABS: 10.0000 mg | ORAL_TABLET | Freq: Every day | ORAL | Status: DC

## 2015-09-02 MED ORDER — PRAZOSIN HCL 1 MG PO CAPS: 1.0000 mg | ORAL_CAPSULE | Freq: Two times a day (BID) | ORAL | Status: DC

## 2015-09-02 MED ORDER — QUETIAPINE FUMARATE 100 MG PO TABS: 100.0000 mg | ORAL_TABLET | Freq: Every day | ORAL | Status: DC

## 2015-09-02 MED ORDER — SERTRALINE HCL 50 MG PO TABS: 50.0000 mg | ORAL_TABLET | Freq: Every day | ORAL | Status: DC

## 2015-09-02 NOTE — Progress Notes (Signed)
  [facility] Adult Case Management Discharge Plan :  Will you be returning to the same living situation after discharge:  Yes,  home  At discharge, do you have transportation home?: Yes,  bus Do you have the ability to pay for your medications: Yes,  insurance   Release of information consent forms completed and in the chart;  Patient's signature needed at discharge.  Patient to Follow up at: Follow-up Information    Follow up with [facility]  On 09/14/2015.   Why:  Your hospital follow up appointment is on Wednesday July 19th at 3:00pm.    Contact information:   [id number] [place], [place] Phone: [phone] Fax: [phone]      Next level of care provider has access to [place] and Suicide Prevention discussed: Yes,  with patient   Have you used any form of tobacco in the last 30 days? (Cigarettes, Smokeless Tobacco, Cigars, and/or Pipes): Yes  Has patient been referred to the Quitline?: Patient refused referral  Patient has been referred for addiction treatment: Pt. refused referral  [place] MSW, [place]  09/02/2015, 10:24 AM

## 2015-09-02 NOTE — BHH Suicide Risk Assessment (Signed)
[place] INPATIENT:  Family/Significant Other Suicide Prevention Education  Suicide Prevention Education:  Patient Refusal for Family/Significant Other Suicide Prevention Education: The patient [name] has refused to provide written consent for family/significant other to be provided Family/Significant Other Suicide Prevention Education during admission and/or prior to discharge.  Physician notified.  [place] MSW, [place]  09/02/2015, 10:23 AM

## 2015-09-02 NOTE — Progress Notes (Signed)
D: Observed pt in room laying in bed. Patient alert and oriented x4. Patient denies SI/HI/AVH. Pt affect is sad and anxious. Pt stated her day was "all right." Pt indicated she went to two groups and learned "how not to let people bring your self-esteem down." Pt rated depression 1/10 and anxiety 0/0. Pt had no complaints at this time.  A: Offered active listening and support. Provided therapeutic communication. Administered scheduled medications. Encouraged pt to follow medication protocol post discharge R: Pt pleasant and cooperative. Pt endorsed the importance of following medication protocol post discharge. Pt medication compliant. Will continue Q15 min. checks. Safety maintained.

## 2015-09-02 NOTE — Consult Note (Signed)
PCP:   Wendie Simmer, MD   Chief Complaint:  Fall, decreased mentation  HPI: This is a 30 year old female admitted to psych floor with suicidal ideation. Today while patient was walking in the hallway she had a syncopal episode. She fell and possibly hit her head. She did brief episode of loss of consciousness lasting approximately 30 seconds. It was a witnessed fall. On awakening patient was less responsive and not very verbal. There is a question of shaking raising the question of seizures. But there is no grand mal activity. The patient does have history of seizures and has not been on medication for greater than 2 years [Based on chart review]. When I examine the patient she was nonverbal but appeared to understand the medications. She squeezes both hands weakly as instructed and attempted to move her legs. She had some eye rolling to the back of her head. On palpating her tummy which appears to be somewhat uncomfortable, she moved her hands quite decisively to remove my hands from palpating her stomach. I reexamined the patient 20 minutes later she again will my handwritten necessity from her tummy. Initially would not speak but then told me a weak voice that she's had nausea for over a week and some abdominal discomfort for over a week. She states she believes the pain is from her uterus as she has a history of uterine cancer. She states she has not been on seizure medications over 2 years. Her last seizure was 2 years ago. She states her physician discontinued her antiepileptic medications.  Review of Systems:  The patient denies anorexia, fever, weight loss,, vision loss, decreased hearing, hoarseness, chest pain, syncope, dyspnea on exertion, peripheral edema, balance deficits, hemoptysis, abdominal pain, melena, hematochezia, severe indigestion/heartburn, hematuria, incontinence, genital sores, muscle weakness, suspicious skin lesions, transient blindness, difficulty walking, depression,  unusual weight change, abnormal bleeding, enlarged lymph nodes, angioedema, and breast masses.  Past Medical History: Past Medical History  Diagnosis Date  . Epilepsy with grand mal seizures on awakening ([place])   . Brain tumor ([place])   . Schizoaffective disorder   . Anxiety   . Depression   . Seizures ([place])   . Kidney stones   . Kidney stone   . Cancer ([place]) 1991    ovarian  . Ovarian cancer [facility])    Past Surgical History  Procedure Laterality Date  . Kidney stone removal    . Appendectomy    . Abdominal hysterectomy      partial; took fallopian tubes and ovaries    Medications: Prior to Admission medications   Medication Sig Start Date End Date Taking? Authorizing Provider  hydrochlorothiazide (HYDRODIURIL) 25 MG tablet Take 1 tablet (25 mg total) by mouth daily. Patient not taking: Reported on 09/16/2014 06/30/14   Elvera Maria, CNM    Allergies:   Allergies  Allergen Reactions  . Ketorolac Tromethamine Hives  . Lortab [Hydrocodone-Acetaminophen] Hives    Tolerates acetaminophen  . Tramadol Hives    Social History:  reports that she has been smoking Cigarettes.  She has a 4 pack-year smoking history. She has never used smokeless tobacco. She reports that she does not drink alcohol or use illicit drugs.  Family History: Family History  Problem Relation Age of Onset  . Diabetes type II Other   . Asthma Mother     Physical Exam: Filed Vitals:   08/31/15 2100 09/01/15 0700 09/01/15 2142 09/01/15 2300  BP: 160/114 142/84 125/67 130/80  Pulse: 74 80 78 62  Temp: 98.7 F (37.1 C) 98 F (36.7 C)  98.4 F (36.9 C)  TempSrc: Oral Oral  Oral  Resp: 18 18  18   Height: 5' 5.75" (1.67 m)     Weight: 113.626 kg (250 lb 8 oz)     SpO2: 100%       General:  Alert and oriented times three, well developed and nourished, no acute distress Eyes: PERRLA, pink conjunctiva, no scleral icterus ENT: Moist oral mucosa, neck supple, no thyromegaly Lungs: clear to  ascultation, no wheeze, no crackles, no use of accessory muscles Cardiovascular: regular rate and rhythm, no regurgitation, no gallops, no murmurs. No carotid bruits, no JVD Abdomen: soft, positive BS, non-tender, non-distended, no organomegaly, not an acute abdomen GU: not examined Neuro: CN II - XII grossly intact, sensation intact Musculoskeletal: strength 5/5 all extremities, no clubbing, cyanosis or edema Skin: no rash, no subcutaneous crepitation, no decubitus Psych: appropriate patient   Labs on Admission:   Recent Labs  08/30/15 2100  NA 140  K 3.4*  CL 108  CO2 25  GLUCOSE 108*  BUN 21*  CREATININE 1.05*  CALCIUM 9.4    Recent Labs  08/30/15 2100  AST 15  ALT 14  ALKPHOS 75  BILITOT 0.4  PROT 8.0  ALBUMIN 4.2   No results for input(s): LIPASE, AMYLASE in the last 72 hours.  Recent Labs  08/30/15 2100  WBC 10.0  HGB 11.6*  HCT 34.5*  MCV 91.0  PLT 349   No results for input(s): CKTOTAL, CKMB, CKMBINDEX, TROPONINI in the last 72 hours. Invalid input(s): POCBNP No results for input(s): DDIMER in the last 72 hours. No results for input(s): HGBA1C in the last 72 hours. No results for input(s): CHOL, HDL, LDLCALC, TRIG, CHOLHDL, LDLDIRECT in the last 72 hours. No results for input(s): TSH, T4TOTAL, T3FREE, THYROIDAB in the last 72 hours.  Invalid input(s): FREET3 No results for input(s): VITAMINB12, FOLATE, FERRITIN, TIBC, IRON, RETICCTPCT in the last 72 hours.  Micro Results: No results found for this or any previous visit (from the past 240 hour(s)).   Radiological Exams on Admission: Ct Head Wo Contrast  09/01/2015  CLINICAL DATA:  Initial evaluation for acute psychiatric illness. History of schizoaffective disorder. EXAM: CT HEAD WITHOUT CONTRAST TECHNIQUE: Contiguous axial images were obtained from the base of the skull through the vertex without intravenous contrast. COMPARISON:  Prior CT from 03/20/2011. FINDINGS: There is no acute intracranial  hemorrhage or infarct. No mass lesion or midline shift. Gray-white matter differentiation is well maintained. Ventricles are normal in size without evidence of hydrocephalus. CSF containing spaces are within normal limits. No extra-axial fluid collection. Calvarium is prominent with thickening of the diploic space, stable. Calvarium intact. Orbital soft tissues are within normal limits. The paranasal sinuses and mastoid air cells are well pneumatized and free of fluid. Scalp soft tissues are unremarkable. IMPRESSION: Stable normal appearance of the brain. No acute intracranial process identified. Electronically Signed   By: Jeannine Boga M.D.   On: 09/01/2015 23:13    Assessment/Plan Present on Admission:  Fall  -Unclear etiology, possible orthostatics hypotension, especially if patient has been nauseous for a week. We'll check orthostatic vitals. -Encourage by mouth fluid intake -Antiemetics when necessary -Patient with epigastric discomfort on examination. We'll order a GI cocktail once -Protonix daily -UA with culture ordered. Doubt seizures. Some element of selective mutism -Labs ordered CMP, CBC, UA, troponins. Will review once available. EKG ordered -If abdominal discomfort persists we'll broaden studies  .  Schizoaffective disorder, depressive type [facility]) -Per psychiatry  . Essential hypertension  -Stable, home medications resumed   . Tobacco use disorder -Nicotine patch, doing nebs as needed  . Cannabis use disorder, moderate, dependence ([place]) -Aware  Zakar Brosch 09/02/2015, 12:28 AM

## 2015-09-02 NOTE — Progress Notes (Signed)
Patient denies SI/HI, denies A/V hallucinations. Patient verbalizes understanding of discharge instructions, follow up care and prescriptions. Patient given all belongings from  locker. Patient escorted out by staff, transported by security to the bus stoop.       Marland Kitchen

## 2015-09-02 NOTE — [facility] Group Notes (Signed)
[facility] Group Notes:  (Nursing/MHT/Case Management/Adjunct)  Date:  09/02/2015  Time:  2:15 AM  Type of Therapy:  Psychoeducational Skills  Summary of Progress/Problems:  [name] 09/02/2015, 2:15 AM

## 2015-09-02 NOTE — Plan of Care (Signed)
Problem: Safety: Goal: Periods of time without injury will increase Outcome: Not Progressing Pt had a fall this evening, no injury seems to have occurred though.

## 2015-09-03 LAB — HEMOGLOBIN A1C: Hgb A1c MFr Bld: 5.4 % (ref 4.0–6.0)

## 2015-09-03 LAB — PROLACTIN: Prolactin: 5.6 ng/mL (ref 4.8–23.3)

## 2015-10-03 ENCOUNTER — Encounter (HOSPITAL_COMMUNITY): Payer: Self-pay | Admitting: Emergency Medicine

## 2015-10-03 ENCOUNTER — Emergency Department (HOSPITAL_COMMUNITY)
Admission: EM | Admit: 2015-10-03 | Discharge: 2015-10-03 | Disposition: A | Discharge status: Home / Self Care | Payer: Medicaid Other | Attending: Emergency Medicine | Admitting: Emergency Medicine

## 2015-10-03 DIAGNOSIS — F17210 Nicotine dependence, cigarettes, uncomplicated: Secondary | ICD-10-CM | POA: Diagnosis not present

## 2015-10-03 DIAGNOSIS — L02412 Cutaneous abscess of left axilla: Secondary | ICD-10-CM | POA: Insufficient documentation

## 2015-10-03 DIAGNOSIS — Z8543 Personal history of malignant neoplasm of ovary: Secondary | ICD-10-CM | POA: Diagnosis not present

## 2015-10-03 MED ORDER — DOXYCYCLINE HYCLATE 100 MG PO TABS
100.0000 mg | ORAL_TABLET | Freq: Once | ORAL | Status: AC
  Administered 2015-10-03: 100 mg via ORAL
  Filled 2015-10-03: qty 1

## 2015-10-03 MED ORDER — DOXYCYCLINE HYCLATE 100 MG PO CAPS: 100.0000 mg | ORAL_CAPSULE | Freq: Two times a day (BID) | ORAL | 0 refills | Status: DC

## 2015-10-03 MED ORDER — LIDOCAINE-EPINEPHRINE 1 %-1:100000 IJ SOLN
10.0000 mL | Freq: Once | INTRAMUSCULAR | Status: AC
  Administered 2015-10-03: 10 mL via INTRADERMAL
  Filled 2015-10-03: qty 1

## 2015-10-03 MED ORDER — CEPHALEXIN 500 MG PO CAPS: 500.0000 mg | ORAL_CAPSULE | Freq: Two times a day (BID) | ORAL | 0 refills | Status: DC

## 2015-10-03 MED ORDER — IBUPROFEN 800 MG PO TABS
800.0000 mg | ORAL_TABLET | Freq: Once | ORAL | Status: AC
  Administered 2015-10-03: 800 mg via ORAL
  Filled 2015-10-03: qty 1

## 2015-10-03 MED ORDER — CEPHALEXIN 500 MG PO CAPS
500.0000 mg | ORAL_CAPSULE | Freq: Once | ORAL | Status: AC
  Administered 2015-10-03: 500 mg via ORAL
  Filled 2015-10-03: qty 1

## 2015-10-03 MED ORDER — IBUPROFEN 800 MG PO TABS: 800.0000 mg | ORAL_TABLET | Freq: Three times a day (TID) | ORAL | 0 refills | Status: DC

## 2015-10-03 NOTE — ED Notes (Signed)
Patient stated she was not leaving until she speak to her doctor. Patient was asked do she need a wheelchair. Patient stated there is nothing wrong with her legs.

## 2015-10-03 NOTE — ED Provider Notes (Signed)
[facility] DEPT Provider Note   CSN: [id number] Arrival date & time: 10/03/15  0008  By signing my name below, I, [name], attest that this documentation has been prepared under the direction and in the presence of [name], MD. Electronically Signed: [name], ED Scribe. 10/03/15. 12:55 AM.  First MD Initiated Contact with Patient 10/03/15 0052    History   Chief Complaint Chief Complaint  Patient presents with  . Abscess  . Emesis   The history is provided by the patient. No language interpreter was used.  Emesis      HPI Comments: [name] is a 30 y.o. female who presents to the Emergency Department complaining of gradual worsening area of erythema and induration onset 3 weeks. Pt was seen on 09/08/15 for a diagnosed abscess under her left axilla. Pt states her PCP told her to go to ED to have the abscess drained. Pt states she has associated vomiting for 2 days.  Past Medical History:  Diagnosis Date  . Anxiety   . Brain tumor ([place])   . Cancer ([place]) 1991   ovarian  . Depression   . 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 Kidney stone   . Kidney stones   . Ovarian cancer ([place])   . Schizoaffective disorder   . Seizures [facility])     Patient Active Problem List   Diagnosis Date Noted  . Tobacco use disorder 09/01/2015  . Cannabis use disorder, moderate, dependence ([place]) 09/01/2015  . OSA (obstructive sleep apnea) 10/07/2013  . Upper airway cough syndrome 09/03/2013  . Essential hypertension 09/03/2013  . Schizoaffective disorder, depressive type ([place]) 02/22/2011    Past Surgical History:  Procedure Laterality Date  . ABDOMINAL HYSTERECTOMY     partial; took fallopian tubes and ovaries  . APPENDECTOMY    . kidney stone removal      OB History    Gravida Para Term Preterm AB Living   0 0 0 0 0 0   SAB TAB Ectopic Multiple Live Births   0 0 0 0         Home Medications    Prior to Admission medications     Medication Sig Start Date End Date Taking? Authorizing Provider  ARIPiprazole (ABILIFY) 10 MG tablet Take 1 tablet (10 mg total) by mouth daily. 09/02/15   Clovis Fredrickson, MD  hydrochlorothiazide (HYDRODIURIL) 25 MG tablet Take 1 tablet (25 mg total) by mouth daily. 09/02/15   Clovis Fredrickson, MD  prazosin (MINIPRESS) 1 MG capsule Take 1 capsule (1 mg total) by mouth 2 (two) times daily. 09/02/15   Clovis Fredrickson, MD  QUEtiapine (SEROQUEL) 100 MG tablet Take 1 tablet (100 mg total) by mouth at bedtime. 09/02/15   Clovis Fredrickson, MD  sertraline (ZOLOFT) 50 MG tablet Take 1 tablet (50 mg total) by mouth daily. 09/02/15   Clovis Fredrickson, MD  traZODone (DESYREL) 100 MG tablet Take 1 tablet (100 mg total) by mouth at bedtime as needed for sleep. 09/02/15   Clovis Fredrickson, MD    Family History Family History  Problem Relation Age of Onset  . Diabetes type II Other   . Asthma Mother     Social History Social History  Substance Use Topics  . Smoking status: Heavy Tobacco Smoker    Packs/day: 2.00    Years: 2.00    Types: Cigarettes    Last attempt to quit: 07/27/2012  . Smokeless tobacco: Never  Used  . Alcohol use No     Allergies   Ketorolac tromethamine; Lortab [hydrocodone-acetaminophen]; and Tramadol  Review of Systems Review of Systems 10 Systems reviewed and are negative for acute change except as noted in the HPI.  Physical Exam Updated Vital Signs BP (!) 155/127 (BP Location: Right Arm)   Pulse 97   Temp 98.7 F (37.1 C) (Oral)   Resp 18   SpO2 100%   Physical Exam  Constitutional: She is oriented to person, place, and time. She appears well-developed and well-nourished. No distress.  HENT:  Head: Normocephalic and atraumatic.  Nose: Nose normal.  Mouth/Throat: Oropharynx is clear and moist. No oropharyngeal exudate.  Eyes: Conjunctivae and EOM are normal. Pupils are equal, round, and reactive to light. No scleral icterus.  Neck: Normal range  of motion. Neck supple. No JVD present. No tracheal deviation present. No thyromegaly present.  Cardiovascular: Normal rate, regular rhythm and normal heart sounds.  Exam reveals no gallop and no friction rub.   No murmur heard. Pulmonary/Chest: Effort normal and breath sounds normal. No respiratory distress. She has no wheezes. She exhibits no tenderness.  Abdominal: Soft. Bowel sounds are normal. She exhibits no distension and no mass. There is no tenderness. There is no rebound and no guarding.  Musculoskeletal: Normal range of motion. She exhibits no edema or tenderness.  Lymphadenopathy:    She has no cervical adenopathy.  Neurological: She is alert and oriented to person, place, and time. No cranial nerve deficit. She exhibits normal muscle tone.  Skin: Skin is warm and dry. No rash noted. No erythema. No pallor.  Left axilla 5 cm abscess with tenderness to palpation No drainage  Nursing note and vitals reviewed.   ED Treatments / Results  DIAGNOSTIC STUDIES: Oxygen Saturation is 100% on RA, normal by my interpretation.    COORDINATION OF CARE: 12:55 AM Discussed treatment plan with pt at bedside which includes incision and drainage and pt agreed to plan.  Labs (all labs ordered are listed, but only abnormal results are displayed) Labs Reviewed - No data to display  EKG  EKG Interpretation None       Radiology No results found.  Procedures .Marland KitchenIncision and Drainage Date/Time: 10/03/2015 12:58 AM Performed by: [name] Authorized by: [name]   Consent:    Consent obtained:  Verbal   Consent given by:  Patient Location:    Type:  Abscess   Size:  5 cm   Location: Left axilla.   (including critical care time)  Medications Ordered in ED Medications - No data to display   Initial Impression / Assessment and Plan / ED Course  I have reviewed the triage vital signs and the nursing notes.  Pertinent labs & imaging results that were available during my  care of the patient were reviewed by me and considered in my medical decision making (see chart for details).  Clinical Course   Patient presents to the emergency department for left axilla abscess. Perform I&D. Patient will be sent home with 3 days or if antibiotics.  PCP follow up advised within 3 days.  She appears well and in NAD. VS remain within her normal limits and she is safe for DC.    INCISION AND DRAINAGE Performed by: [name] Consent: Verbal consent obtained. Risks and benefits: risks, benefits and alternatives were discussed Type: abscess  Body area: L axilla  Anesthesia: local infiltration  Incision was made with a scalpel.  Local anesthetic: lidocaine 1% with epinephrine  Anesthetic total: 6 ml  Complexity: complex Blunt dissection to break up loculations  Drainage: purulent  Drainage amount: 10cc   Patient tolerance: Patient tolerated the procedure well with no immediate complications.     Final Clinical Impressions(s) / ED Diagnoses   Final diagnoses:  None    New Prescriptions New Prescriptions   No medications on file     I personally performed the services described in this documentation, which was scribed in my presence. The recorded information has been reviewed and is accurate.      [name], MD 10/03/15 [phone]

## 2015-10-03 NOTE — ED Notes (Signed)
Patient d/c'd se;f care/  F/U and medications reviewed.  Patient verbalized understanding.

## 2015-10-03 NOTE — ED Triage Notes (Signed)
Pt states that she showed her PCP an abscess under her L axilla on 7/13 that has gotten bigger. Pt states that she has been vomiting x 2 days as well but has been drinking at home. Alert and oriented.

## 2015-10-12 ENCOUNTER — Encounter (HOSPITAL_COMMUNITY): Payer: Self-pay | Admitting: Emergency Medicine

## 2015-10-12 ENCOUNTER — Emergency Department (HOSPITAL_COMMUNITY)
Admission: EM | Admit: 2015-10-12 | Discharge: 2015-10-13 | Disposition: A | Discharge status: Home / Self Care | Payer: Medicaid Other | Attending: Emergency Medicine | Admitting: Emergency Medicine

## 2015-10-12 DIAGNOSIS — J45909 Unspecified asthma, uncomplicated: Secondary | ICD-10-CM | POA: Insufficient documentation

## 2015-10-12 DIAGNOSIS — E876 Hypokalemia: Secondary | ICD-10-CM

## 2015-10-12 DIAGNOSIS — Z85841 Personal history of malignant neoplasm of brain: Secondary | ICD-10-CM | POA: Diagnosis not present

## 2015-10-12 DIAGNOSIS — F17210 Nicotine dependence, cigarettes, uncomplicated: Secondary | ICD-10-CM | POA: Insufficient documentation

## 2015-10-12 DIAGNOSIS — Z8543 Personal history of malignant neoplasm of ovary: Secondary | ICD-10-CM | POA: Insufficient documentation

## 2015-10-12 DIAGNOSIS — B349 Viral infection, unspecified: Secondary | ICD-10-CM | POA: Insufficient documentation

## 2015-10-12 DIAGNOSIS — R112 Nausea with vomiting, unspecified: Secondary | ICD-10-CM

## 2015-10-12 DIAGNOSIS — Z79899 Other long term (current) drug therapy: Secondary | ICD-10-CM | POA: Diagnosis not present

## 2015-10-12 DIAGNOSIS — I10 Essential (primary) hypertension: Secondary | ICD-10-CM | POA: Diagnosis not present

## 2015-10-12 LAB — COMPREHENSIVE METABOLIC PANEL
ALBUMIN: 4.2 g/dL (ref 3.5–5.0)
ALK PHOS: 81 U/L (ref 38–126)
ALT: 17 U/L (ref 14–54)
ANION GAP: 8 (ref 5–15)
AST: 17 U/L (ref 15–41)
BILIRUBIN TOTAL: 0.6 mg/dL (ref 0.3–1.2)
BUN: 13 mg/dL (ref 6–20)
CALCIUM: 9.1 mg/dL (ref 8.9–10.3)
CO2: 25 mmol/L (ref 22–32)
CREATININE: 0.81 mg/dL (ref 0.44–1.00)
Chloride: 106 mmol/L (ref 101–111)
GFR calc Af Amer: >60 mL/min (ref >60)
GFR calc non Af Amer: >60 mL/min (ref >60)
GLUCOSE: 98 mg/dL (ref 65–99)
Potassium: 3 mmol/L — ABNORMAL LOW (ref 3.5–5.1)
SODIUM: 139 mmol/L (ref 135–145)
Total Protein: 8 g/dL (ref 6.5–8.1)

## 2015-10-12 LAB — CBC
HCT: 34 % — ABNORMAL LOW (ref 36.0–46.0)
HEMOGLOBIN: 11.1 g/dL — AB (ref 12.0–15.0)
MCH: 30.7 pg (ref 26.0–34.0)
MCHC: 32.6 g/dL (ref 30.0–36.0)
MCV: 94.2 fL (ref 78.0–100.0)
PLATELETS: 345 10*3/uL (ref 150–400)
RBC: 3.61 MIL/uL — ABNORMAL LOW (ref 3.87–5.11)
RDW: 13.2 % (ref 11.5–15.5)
WBC: 6.7 10*3/uL (ref 4.0–10.5)

## 2015-10-12 NOTE — ED Triage Notes (Signed)
Patient presents for generalized body aches, subjective fever, N/V/D, productive cough with yellow sputum, nasal congestion x2 days. A&O x4.

## 2015-10-13 ENCOUNTER — Emergency Department (HOSPITAL_COMMUNITY): Payer: Medicaid Other

## 2015-10-13 MED ORDER — POTASSIUM CHLORIDE CRYS ER 20 MEQ PO TBCR
40.0000 meq | EXTENDED_RELEASE_TABLET | Freq: Once | ORAL | Status: AC
  Administered 2015-10-13: 40 meq via ORAL
  Filled 2015-10-13: qty 2

## 2015-10-13 MED ORDER — ONDANSETRON HCL 8 MG PO TABS: 8.0000 mg | ORAL_TABLET | Freq: Three times a day (TID) | ORAL | 0 refills | Status: DC | PRN

## 2015-10-13 MED ORDER — POTASSIUM CHLORIDE CRYS ER 20 MEQ PO TBCR: 20.0000 meq | EXTENDED_RELEASE_TABLET | Freq: Every day | ORAL | 0 refills | Status: DC

## 2015-10-13 MED ORDER — ACETAMINOPHEN 500 MG PO TABS
1000.0000 mg | ORAL_TABLET | Freq: Once | ORAL | Status: AC
  Administered 2015-10-13: 1000 mg via ORAL
  Filled 2015-10-13: qty 2

## 2015-10-13 MED ORDER — AEROCHAMBER PLUS W/MASK MISC
1.0000 | Freq: Once | Status: AC
  Administered 2015-10-13: 1
  Filled 2015-10-13: qty 1

## 2015-10-13 MED ORDER — BENZONATATE 100 MG PO CAPS: 100.0000 mg | ORAL_CAPSULE | Freq: Three times a day (TID) | ORAL | 0 refills | Status: DC | PRN

## 2015-10-13 MED ORDER — FENTANYL CITRATE (PF) 100 MCG/2ML IJ SOLN
50.0000 ug | Freq: Once | INTRAMUSCULAR | Status: AC
  Administered 2015-10-13: 50 ug via INTRAVENOUS
  Filled 2015-10-13: qty 2

## 2015-10-13 MED ORDER — ALBUTEROL SULFATE HFA 108 (90 BASE) MCG/ACT IN AERS
2.0000 | INHALATION_SPRAY | RESPIRATORY_TRACT | Status: DC | PRN
  Administered 2015-10-13: 2 via RESPIRATORY_TRACT
  Filled 2015-10-13: qty 6.7

## 2015-10-13 MED ORDER — SODIUM CHLORIDE 0.9 % IV BOLUS (SEPSIS)
1000.0000 mL | Freq: Once | INTRAVENOUS | Status: AC
  Administered 2015-10-13: 1000 mL via INTRAVENOUS

## 2015-10-13 MED ORDER — ONDANSETRON HCL 4 MG/2ML IJ SOLN
4.0000 mg | Freq: Once | INTRAMUSCULAR | Status: AC
  Administered 2015-10-13: 4 mg via INTRAVENOUS
  Filled 2015-10-13: qty 2

## 2015-10-13 NOTE — ED Provider Notes (Signed)
[facility] DEPT Provider Note   CSN: [id number] Arrival date & time: 10/12/15  2146  By signing my name below, I, Jasmyn B. Alexander, attest that this documentation has been prepared under the direction and in the presence of Shanon Rosser, MD. Electronically Signed: Tedra Coupe. Sheppard Coil, ED Scribe. 10/13/15. 1:20 AM.  History   Chief Complaint Chief Complaint  Patient presents with  . Generalized Body Aches    HPI HPI Comments: [name] is a 30 y.o. female with PMHx of asthma, anxiety, epilepsy, schizoaffective disorder and depression who presents to the Emergency Department complaining of generalized body aches x 3 days. Pt has associated nausea, vomiting, diarrhea, nasal congestion, subjective fever, decreased appetite and productive cough with yellow sputum. No alleviating factors noted. She denies any abdominal pain.  The history is provided by the patient. No language interpreter was used.   Past Medical History:  Diagnosis Date  . Anxiety   . Brain tumor ([place])   . Cancer ([place]) 1991   ovarian  . Depression   . 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 Kidney stone   . Kidney stones   . Ovarian cancer ([place])   . Schizoaffective disorder   . Seizures [facility])     Patient Active Problem List   Diagnosis Date Noted  . Tobacco use disorder 09/01/2015  . Cannabis use disorder, moderate, dependence ([place]) 09/01/2015  . OSA (obstructive sleep apnea) 10/07/2013  . Upper airway cough syndrome 09/03/2013  . Essential hypertension 09/03/2013  . Schizoaffective disorder, depressive type ([place]) 02/22/2011    Past Surgical History:  Procedure Laterality Date  . ABDOMINAL HYSTERECTOMY     partial; took fallopian tubes and ovaries  . APPENDECTOMY    . kidney stone removal      OB History    Gravida Para Term Preterm AB Living   0 0 0 0 0 0   SAB TAB Ectopic Multiple Live Births   0 0 0 0         Home Medications    Prior to Admission  medications   Medication Sig Start Date End Date Taking? Authorizing Provider  ARIPiprazole (ABILIFY) 10 MG tablet Take 1 tablet (10 mg total) by mouth daily. 09/02/15  Yes Jolanta B Pucilowska, MD  hydrochlorothiazide (HYDRODIURIL) 25 MG tablet Take 1 tablet (25 mg total) by mouth daily. 09/02/15  Yes Jolanta B Pucilowska, MD  oxyCODONE-acetaminophen (PERCOCET) 7.5-325 MG tablet Take 1 tablet by mouth 3 (three) times daily as needed for pain. 09/08/15  Yes Historical Provider, MD  prazosin (MINIPRESS) 1 MG capsule Take 1 capsule (1 mg total) by mouth 2 (two) times daily. 09/02/15  Yes Jolanta B Pucilowska, MD  QUEtiapine (SEROQUEL) 100 MG tablet Take 1 tablet (100 mg total) by mouth at bedtime. 09/02/15  Yes Jolanta B Pucilowska, MD  sertraline (ZOLOFT) 50 MG tablet Take 1 tablet (50 mg total) by mouth daily. 09/02/15  Yes Clovis Fredrickson, MD    Family History Family History  Problem Relation Age of Onset  . Diabetes type II Other   . Asthma Mother     Social History Social History  Substance Use Topics  . Smoking status: Heavy Tobacco Smoker    Packs/day: 2.00    Years: 2.00    Types: Cigarettes    Last attempt to quit: 07/27/2012  . Smokeless tobacco: Never Used  . Alcohol use No     Allergies   Ketorolac tromethamine; Lortab [hydrocodone-acetaminophen]; and Tramadol  Review of Systems Review of Systems 10 Systems reviewed and all are negative for acute change except as noted in the HPI.  Physical Exam Updated Vital Signs BP (!) 139/104 (BP Location: Right Arm)   Pulse 91   Temp 99.2 F (37.3 C) (Oral)   Resp 18   SpO2 98%   Physical Exam  General: Well-developed, well-nourished female in no acute distress; appearance consistent with age of record HENT: normocephalic; atraumatic Eyes: pupils equal, round and reactive to light; extraocular muscles intact Neck: supple Heart: regular rate and rhythm Lungs: Faint expiratory wheezes; frequent cough Abdomen: soft;  nondistended; nontender; no masses or hepatosplenomegaly; bowel sounds present Extremities: No deformity; full range of motion; pulses normal Neurologic: Awake, alert and oriented; motor function intact in all extremities and symmetric; no facial droop Skin: Warm and dry Psychiatric: Normal mood and affect   ED Treatments / Results   Nursing notes and vitals signs, including pulse oximetry, reviewed.  Summary of this visit's results, reviewed by myself:  Labs:  Results for orders placed or performed during the hospital encounter of 10/12/15 (from the past 24 hour(s))  Comprehensive metabolic panel     Status: Abnormal   Collection Time: 10/12/15 10:26 PM  Result Value Ref Range   Sodium 139 135 - 145 mmol/L   Potassium 3.0 (L) 3.5 - 5.1 mmol/L   Chloride 106 101 - 111 mmol/L   CO2 25 22 - 32 mmol/L   Glucose, Bld 98 65 - 99 mg/dL   BUN 13 6 - 20 mg/dL   Creatinine, Ser 0.81 0.44 - 1.00 mg/dL   Calcium 9.1 8.9 - 10.3 mg/dL   Total Protein 8.0 6.5 - 8.1 g/dL   Albumin 4.2 3.5 - 5.0 g/dL   AST 17 15 - 41 U/L   ALT 17 14 - 54 U/L   Alkaline Phosphatase 81 38 - 126 U/L   Total Bilirubin 0.6 0.3 - 1.2 mg/dL   GFR calc non Af Amer >60 >60 mL/min   GFR calc Af Amer >60 >60 mL/min   Anion gap 8 5 - 15  CBC     Status: Abnormal   Collection Time: 10/12/15 10:26 PM  Result Value Ref Range   WBC 6.7 4.0 - 10.5 K/uL   RBC 3.61 (L) 3.87 - 5.11 MIL/uL   Hemoglobin 11.1 (L) 12.0 - 15.0 g/dL   HCT 34.0 (L) 36.0 - 46.0 %   MCV 94.2 78.0 - 100.0 fL   MCH 30.7 26.0 - 34.0 pg   MCHC 32.6 30.0 - 36.0 g/dL   RDW 13.2 11.5 - 15.5 %   Platelets 345 150 - 400 K/uL    Imaging Studies: Dg Chest 2 View  Result Date: 10/13/2015 CLINICAL DATA:  Asthma, wheezing, shortness of breath, and cough. EXAM: CHEST  2 VIEW COMPARISON:  06/14/2015 FINDINGS: The heart size and mediastinal contours are within normal limits. Both lungs are clear. The visualized skeletal structures are unremarkable.  IMPRESSION: No active cardiopulmonary disease. Electronically Signed   By: Lucienne Capers M.D.   On: 10/13/2015 01:50    Procedures (including critical care time)  Medications Ordered in ED Medications  ondansetron (ZOFRAN) injection 4 mg (4 mg Intravenous Given 10/13/15 0216)  fentaNYL (SUBLIMAZE) injection 50 mcg (50 mcg Intravenous Given 10/13/15 0216)  sodium chloride 0.9 % bolus 1,000 mL (1,000 mLs Intravenous New Bag/Given 10/13/15 0216)  acetaminophen (TYLENOL) tablet 1,000 mg (1,000 mg Oral Given 10/13/15 0215)   Final Clinical Impressions(s) / ED Diagnoses  Final diagnoses:  Viral illness  Nausea and vomiting in adult  Hypokalemia   I personally performed the services described in this documentation, which was scribed in my presence. The recorded information has been reviewed and is accurate.    Shanon Rosser, MD 10/13/15 [phone]

## 2015-10-31 ENCOUNTER — Emergency Department (HOSPITAL_COMMUNITY)
Admission: EM | Admit: 2015-10-31 | Discharge: 2015-11-01 | Disposition: A | Discharge status: Home / Self Care | Payer: Medicaid Other | Attending: Emergency Medicine | Admitting: Emergency Medicine

## 2015-10-31 ENCOUNTER — Encounter (HOSPITAL_COMMUNITY): Payer: Self-pay | Admitting: Emergency Medicine

## 2015-10-31 DIAGNOSIS — Z046 Encounter for general psychiatric examination, requested by authority: Secondary | ICD-10-CM | POA: Diagnosis present

## 2015-10-31 DIAGNOSIS — Z8543 Personal history of malignant neoplasm of ovary: Secondary | ICD-10-CM | POA: Insufficient documentation

## 2015-10-31 DIAGNOSIS — F17210 Nicotine dependence, cigarettes, uncomplicated: Secondary | ICD-10-CM | POA: Diagnosis not present

## 2015-10-31 DIAGNOSIS — Z79899 Other long term (current) drug therapy: Secondary | ICD-10-CM | POA: Diagnosis not present

## 2015-10-31 DIAGNOSIS — R109 Unspecified abdominal pain: Secondary | ICD-10-CM | POA: Diagnosis not present

## 2015-10-31 DIAGNOSIS — Z85841 Personal history of malignant neoplasm of brain: Secondary | ICD-10-CM | POA: Insufficient documentation

## 2015-10-31 DIAGNOSIS — I10 Essential (primary) hypertension: Secondary | ICD-10-CM | POA: Diagnosis not present

## 2015-10-31 DIAGNOSIS — F251 Schizoaffective disorder, depressive type: Secondary | ICD-10-CM | POA: Diagnosis not present

## 2015-10-31 DIAGNOSIS — N898 Other specified noninflammatory disorders of vagina: Secondary | ICD-10-CM | POA: Diagnosis not present

## 2015-10-31 DIAGNOSIS — R112 Nausea with vomiting, unspecified: Secondary | ICD-10-CM | POA: Insufficient documentation

## 2015-10-31 NOTE — ED Triage Notes (Signed)
Pt states on Saturday she was walking with her nieces and nephews and felt a sharp pain in her mid lower abdomen that radiated to her back.  States she has had nausea and vomiting since then as well.  Also notes she has had a brown mucous like vaginal discharge.  States when she walks it feels like something wet is discharging.

## 2015-10-31 NOTE — ED Triage Notes (Addendum)
Pt added her brother was killed last month and was admitted at [facility] for psych evaluation due to night terrors.  States she feels like she needs to be evaluated for this again however has a psychiatry appointment on Wednesday and is wondering if she should just wait to see them about the night terrors. She admits to suicidal and homicidal thoughts about an hour ago but denies them now.

## 2015-10-31 NOTE — ED Notes (Signed)
Bed: WTR5 Expected date:  Expected time:  Means of arrival:  Comments: 

## 2015-11-01 ENCOUNTER — Encounter (HOSPITAL_COMMUNITY): Payer: Self-pay | Admitting: Emergency Medicine

## 2015-11-01 DIAGNOSIS — F251 Schizoaffective disorder, depressive type: Secondary | ICD-10-CM | POA: Diagnosis not present

## 2015-11-01 LAB — COMPREHENSIVE METABOLIC PANEL
ALBUMIN: 3.8 g/dL (ref 3.5–5.0)
ALT: 16 U/L (ref 14–54)
ANION GAP: 6 (ref 5–15)
AST: 17 U/L (ref 15–41)
Alkaline Phosphatase: 69 U/L (ref 38–126)
BUN: 14 mg/dL (ref 6–20)
CO2: 29 mmol/L (ref 22–32)
Calcium: 9.1 mg/dL (ref 8.9–10.3)
Chloride: 108 mmol/L (ref 101–111)
Creatinine, Ser: 0.93 mg/dL (ref 0.44–1.00)
GFR calc non Af Amer: >60 mL/min (ref >60)
GLUCOSE: 88 mg/dL (ref 65–99)
POTASSIUM: 3.5 mmol/L (ref 3.5–5.1)
SODIUM: 143 mmol/L (ref 135–145)
Total Bilirubin: 0.3 mg/dL (ref 0.3–1.2)
Total Protein: 7.5 g/dL (ref 6.5–8.1)

## 2015-11-01 LAB — WET PREP, GENITAL
Sperm: NONE SEEN
Trich, Wet Prep: NONE SEEN
YEAST WET PREP: NONE SEEN

## 2015-11-01 LAB — CBC
HEMATOCRIT: 33.7 % — AB (ref 36.0–46.0)
HEMOGLOBIN: 11 g/dL — AB (ref 12.0–15.0)
MCH: 31.2 pg (ref 26.0–34.0)
MCHC: 32.6 g/dL (ref 30.0–36.0)
MCV: 95.5 fL (ref 78.0–100.0)
Platelets: 357 10*3/uL (ref 150–400)
RBC: 3.53 MIL/uL — ABNORMAL LOW (ref 3.87–5.11)
RDW: 13.1 % (ref 11.5–15.5)
WBC: 7.1 10*3/uL (ref 4.0–10.5)

## 2015-11-01 LAB — GC/CHLAMYDIA PROBE AMP (~~LOC~~) NOT AT ARMC
CHLAMYDIA, DNA PROBE: NEGATIVE
NEISSERIA GONORRHEA: NEGATIVE

## 2015-11-01 LAB — ETHANOL: Alcohol, Ethyl (B): <5 mg/dL (ref <5)

## 2015-11-01 LAB — SALICYLATE LEVEL

## 2015-11-01 LAB — ACETAMINOPHEN LEVEL: Acetaminophen (Tylenol), Serum: <10 ug/mL — ABNORMAL LOW (ref 10–30)

## 2015-11-01 MED ORDER — ALUM & MAG HYDROXIDE-SIMETH 200-200-20 MG/5ML PO SUSP: 30.0000 mL | ORAL | Status: DC | PRN

## 2015-11-01 MED ORDER — LORAZEPAM 1 MG PO TABS: 1.0000 mg | ORAL_TABLET | Freq: Three times a day (TID) | ORAL | Status: DC | PRN

## 2015-11-01 MED ORDER — LORAZEPAM 1 MG PO TABS
1.0000 mg | ORAL_TABLET | Freq: Once | ORAL | Status: AC
  Administered 2015-11-01: 1 mg via ORAL
  Filled 2015-11-01: qty 1

## 2015-11-01 MED ORDER — ACETAMINOPHEN 325 MG PO TABS: 650.0000 mg | ORAL_TABLET | ORAL | Status: DC | PRN

## 2015-11-01 MED ORDER — ONDANSETRON HCL 4 MG PO TABS: 4.0000 mg | ORAL_TABLET | Freq: Three times a day (TID) | ORAL | Status: DC | PRN

## 2015-11-01 MED ORDER — ZOLPIDEM TARTRATE 5 MG PO TABS: 5.0000 mg | ORAL_TABLET | Freq: Every evening | ORAL | Status: DC | PRN

## 2015-11-01 NOTE — ED Notes (Signed)
Patient discharged to home.  She denies suicidal or homicidal ideation.  She left the unit ambulatory with instructions to follow up with her outpatient provider.  All belongings were returned to the patient.  She was given a bus pass and AVS.
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
## 2015-11-01 NOTE — ED Notes (Signed)
Pt refuses blood draw RN made aware
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
## 2015-11-01 NOTE — ED Notes (Addendum)
Pt in rm 42. Pt reports feeling depressed. Pt reports brother passed away about a month ago. Pt stated having nightmare at night. Pt denies SI stating "I'm not strong enough to kill myself". Pt reports having a appointment on Wednesday but does not feel safe at home. Pt reports not fully compliant with medication regime.

## 2015-11-01 NOTE — [facility] Suicide Risk Assessment (Signed)
Suicide Risk Assessment  Discharge Assessment   [facility] Discharge Suicide Risk Assessment   Principal Problem: Schizoaffective disorder, depressive type [facility]) Discharge Diagnoses:  Patient Active Problem List   Diagnosis Date Noted  . Schizoaffective disorder, depressive type ([place]) [F25.1] 02/22/2011    Priority: High  . Tobacco use disorder [F17.200] 09/01/2015  . Cannabis use disorder, moderate, dependence ([place]) [F12.20] 09/01/2015  . OSA (obstructive sleep apnea) [G47.33] 10/07/2013  . Upper airway cough syndrome [R05] 09/03/2013  . Essential hypertension [I10] 09/03/2013    Total Time spent with patient: 45 minutes  Musculoskeletal: Strength & Muscle Tone: within normal limits Gait & Station: normal Patient leans: N/A  Psychiatric Specialty Exam: Physical Exam  Constitutional: She is oriented to person, place, and time. She appears well-developed and well-nourished.  HENT:  Head: Normocephalic.  Neck: Normal range of motion.  Respiratory: Effort normal.  Musculoskeletal: Normal range of motion.  Neurological: She is alert and oriented to person, place, and time.  Skin: Skin is warm and dry.  Psychiatric: She has a normal mood and affect. Her speech is normal and behavior is normal. Judgment and thought content normal. Cognition and memory are normal.    Review of Systems  Constitutional: Negative.   HENT: Negative.   Eyes: Negative.   Respiratory: Negative.   Cardiovascular: Negative.   Gastrointestinal: Negative.   Genitourinary: Negative.   Musculoskeletal: Negative.   Skin: Negative.   Neurological: Negative.   Endo/Heme/Allergies: Negative.   Psychiatric/Behavioral: Positive for depression.    Blood pressure 139/98, pulse 88, resp. rate 16, height 5\' 5"  (1.651 m), weight 123.8 kg (273 lb), SpO2 100 %.Body mass index is 45.43 kg/m.  General Appearance: Casual  Eye Contact:  Good  Speech:  Normal Rate  Volume:  Normal  Mood:  Depression, mild  Affect:   Congruent  Thought Process:  Coherent and Descriptions of Associations: Intact  Orientation:  Full (Time, Place, and Person)  Thought Content:  WDL  Suicidal Thoughts:  No  Homicidal Thoughts:  No  Memory:  Immediate;   Good Recent;   Good Remote;   Good  Judgement:  Fair  Insight:  Fair  Psychomotor Activity:  Normal  Concentration:  Concentration: Good and Attention Span: Good  Recall:  Good  Fund of Knowledge:  Fair  Language:  Good  Akathisia:  No  Handed:  Right  AIMS (if indicated):     Assets:  Housing Leisure Time Physical Health Resilience Social Support  ADL's:  Intact  Cognition:  WNL  Sleep:       Mental Status Per Nursing Assessment::   On Admission:   suicidal ideations  Demographic Factors:  Adolescent or young adult  Loss Factors: NA  Historical Factors: NA  Risk Reduction Factors:   Sense of responsibility to family, Living with another person, especially a relative, Positive social support and Positive therapeutic relationship  Continued Clinical Symptoms:  Depression, mild  Cognitive Features That Contribute To Risk:  None    Suicide Risk:  Minimal: No identifiable suicidal ideation.  Patients presenting with no risk factors but with morbid ruminations; may be classified as minimal risk based on the severity of the depressive symptoms    Plan Of Care/Follow-up recommendations:  Activity:  as tolerated Diet:  heart healthy diet  LORD, JAMISON, NP 11/01/2015, 9:44 AM

## 2015-11-04 ENCOUNTER — Emergency Department (HOSPITAL_COMMUNITY): Payer: Medicaid Other

## 2015-11-04 ENCOUNTER — Emergency Department (HOSPITAL_COMMUNITY)
Admission: EM | Admit: 2015-11-04 | Discharge: 2015-11-05 | Disposition: A | Discharge status: Home / Self Care | Payer: Medicaid Other

## 2015-11-04 ENCOUNTER — Encounter (HOSPITAL_COMMUNITY): Payer: Self-pay

## 2015-11-04 DIAGNOSIS — R4781 Slurred speech: Secondary | ICD-10-CM | POA: Insufficient documentation

## 2015-11-04 DIAGNOSIS — R7989 Other specified abnormal findings of blood chemistry: Secondary | ICD-10-CM | POA: Diagnosis not present

## 2015-11-04 DIAGNOSIS — F17210 Nicotine dependence, cigarettes, uncomplicated: Secondary | ICD-10-CM | POA: Diagnosis not present

## 2015-11-04 DIAGNOSIS — M7989 Other specified soft tissue disorders: Secondary | ICD-10-CM | POA: Insufficient documentation

## 2015-11-04 DIAGNOSIS — Z8543 Personal history of malignant neoplasm of ovary: Secondary | ICD-10-CM | POA: Insufficient documentation

## 2015-11-04 DIAGNOSIS — Z79899 Other long term (current) drug therapy: Secondary | ICD-10-CM | POA: Diagnosis not present

## 2015-11-04 DIAGNOSIS — R079 Chest pain, unspecified: Secondary | ICD-10-CM | POA: Diagnosis not present

## 2015-11-04 DIAGNOSIS — Z791 Long term (current) use of non-steroidal anti-inflammatories (NSAID): Secondary | ICD-10-CM | POA: Diagnosis not present

## 2015-11-04 DIAGNOSIS — I10 Essential (primary) hypertension: Secondary | ICD-10-CM | POA: Insufficient documentation

## 2015-11-04 LAB — BRAIN NATRIURETIC PEPTIDE: B NATRIURETIC PEPTIDE 5: 39.5 pg/mL (ref 0.0–100.0)

## 2015-11-04 LAB — RAPID URINE DRUG SCREEN, HOSP PERFORMED
Amphetamines: NOT DETECTED
BENZODIAZEPINES: NOT DETECTED
Barbiturates: NOT DETECTED
COCAINE: NOT DETECTED
OPIATES: NOT DETECTED
Tetrahydrocannabinol: POSITIVE — AB

## 2015-11-04 LAB — CBC
HCT: 32.8 % — ABNORMAL LOW (ref 36.0–46.0)
Hemoglobin: 10.6 g/dL — ABNORMAL LOW (ref 12.0–15.0)
MCH: 30.9 pg (ref 26.0–34.0)
MCHC: 32.3 g/dL (ref 30.0–36.0)
MCV: 95.6 fL (ref 78.0–100.0)
PLATELETS: 391 10*3/uL (ref 150–400)
RBC: 3.43 MIL/uL — ABNORMAL LOW (ref 3.87–5.11)
RDW: 13.1 % (ref 11.5–15.5)
WBC: 8.5 10*3/uL (ref 4.0–10.5)

## 2015-11-04 LAB — I-STAT TROPONIN, ED: TROPONIN I, POC: 0 ng/mL (ref 0.00–0.08)

## 2015-11-04 LAB — ETHANOL

## 2015-11-04 LAB — BASIC METABOLIC PANEL
Anion gap: 7 (ref 5–15)
BUN: 15 mg/dL (ref 6–20)
CALCIUM: 9.2 mg/dL (ref 8.9–10.3)
CHLORIDE: 106 mmol/L (ref 101–111)
CO2: 28 mmol/L (ref 22–32)
CREATININE: 1 mg/dL (ref 0.44–1.00)
GFR calc Af Amer: >60 mL/min (ref >60)
Glucose, Bld: 105 mg/dL — ABNORMAL HIGH (ref 65–99)
Potassium: 3.3 mmol/L — ABNORMAL LOW (ref 3.5–5.1)
SODIUM: 141 mmol/L (ref 135–145)

## 2015-11-04 LAB — SALICYLATE LEVEL

## 2015-11-04 LAB — ACETAMINOPHEN LEVEL

## 2015-11-04 LAB — I-STAT CG4 LACTIC ACID, ED: Lactic Acid, Venous: 2.06 mmol/L (ref 0.5–1.9)

## 2015-11-04 LAB — CBG MONITORING, ED: GLUCOSE-CAPILLARY: 121 mg/dL — AB (ref 65–99)

## 2015-11-04 MED ORDER — SODIUM CHLORIDE 0.9 % IV BOLUS (SEPSIS)
1000.0000 mL | INTRAVENOUS | Status: AC
  Administered 2015-11-04: 1000 mL via INTRAVENOUS

## 2015-11-04 NOTE — ED Notes (Signed)
Lactic acid= 2.06, PA Humes notified

## 2015-11-04 NOTE — ED Notes (Signed)
Pt returned from radiology via stretcher

## 2015-11-04 NOTE — ED Notes (Signed)
Bed: [id number] Expected date:  Expected time:  Means of arrival:  Comments: 30 yo F  Flu like sxs

## 2015-11-04 NOTE — ED Provider Notes (Signed)
[facility] DEPT Provider Note   CSN: [id number] Arrival date & time: 11/04/15  2050  By signing my name below, I, Reola Mosher, attest that this documentation has been prepared under the direction and in the presence of [employer], PA-C.  Electronically Signed: Reola Mosher, ED Scribe. 11/04/15. 9:47 PM.  History   Chief Complaint Chief Complaint  Patient presents with  . Chest Pain  . Altered Mental Status   LEVEL 5 CAVEAT: HPI and ROS limited due to acuity of condition of patient  The history is provided by medical records, the patient and a friend. No language interpreter was used.   HPI Comments: [name] is a 30 y.o. female who presents to the Emergency Department complaining of centralized chest pain onset PTA. Pt's friend also notes that she has not been acting like herself throughout the day, and has had slurred speech. Last known normal: ~7.5 hours ago. Pt additionally reports associated bilateral lower extremity swelling and numbness, and balance issues while walking. Pt states that she was admitted into [facility] ICU for similar symptoms in the past. Pt notes that she has only taken her BP medications today. She is currently followed by [facility] cardiology.    Past Medical History:  Diagnosis Date  . Anxiety   . Brain tumor ([place])   . Cancer ([place]) 1991   ovarian  . Depression   . Epilepsy with grand mal seizures on awakening ([place])   . Kidney stone   . Kidney stones   . Ovarian cancer ([place])   . Schizoaffective disorder   . Seizures [facility])     Patient Active Problem List   Diagnosis Date Noted  . Tobacco use disorder 09/01/2015  . Cannabis use disorder, moderate, dependence ([place]) 09/01/2015  . OSA (obstructive sleep apnea) 10/07/2013  . Upper airway cough syndrome 09/03/2013  . Essential hypertension 09/03/2013  . Schizoaffective disorder, depressive type ([place]) 02/22/2011    Past Surgical History:  Procedure Laterality Date  .  ABDOMINAL HYSTERECTOMY     partial; took fallopian tubes and ovaries  . APPENDECTOMY    . kidney stone removal      OB History    Gravida Para Term Preterm AB Living   0 0 0 0 0 0   SAB TAB Ectopic Multiple Live Births   0 0 0 0         Home Medications    Prior to Admission medications   Medication Sig Start Date End Date Taking? Authorizing Provider  hydrochlorothiazide (HYDRODIURIL) 25 MG tablet Take 1 tablet (25 mg total) by mouth daily. 09/02/15  Yes Jolanta B Pucilowska, MD  oxyCODONE-acetaminophen (PERCOCET) 7.5-325 MG tablet Take 1 tablet by mouth 3 (three) times daily as needed for pain. 09/08/15  Yes Historical Provider, MD  QUEtiapine (SEROQUEL) 100 MG tablet Take 1 tablet (100 mg total) by mouth at bedtime. 09/02/15  Yes Jolanta B Pucilowska, MD  sertraline (ZOLOFT) 50 MG tablet Take 1 tablet (50 mg total) by mouth daily. 09/02/15  Yes Jolanta B Pucilowska, MD  ARIPiprazole (ABILIFY) 10 MG tablet Take 1 tablet (10 mg total) by mouth daily. 09/02/15   Clovis Fredrickson, MD  benzonatate (TESSALON) 100 MG capsule Take 1 capsule (100 mg total) by mouth 3 (three) times daily as needed for cough (swallow whole). Patient not taking: Reported on 11/04/2015 10/13/15   Shanon Rosser, MD  ondansetron (ZOFRAN) 8 MG tablet Take 1 tablet (8 mg total) by mouth every 8 (eight) hours as  needed for nausea or vomiting. Patient not taking: Reported on 10/31/2015 10/13/15   Shanon Rosser, MD  potassium chloride SA (K-DUR,KLOR-CON) 20 MEQ tablet Take 1 tablet (20 mEq total) by mouth daily. Patient not taking: Reported on 11/04/2015 10/13/15   Shanon Rosser, MD  prazosin (MINIPRESS) 1 MG capsule Take 1 capsule (1 mg total) by mouth 2 (two) times daily. 09/02/15   Clovis Fredrickson, MD    Family History Family History  Problem Relation Age of Onset  . Diabetes type II Other   . Asthma Mother     Social History Social History  Substance Use Topics  . Smoking status: Heavy Tobacco Smoker    Packs/day: 2.00     Years: 2.00    Types: Cigarettes    Last attempt to quit: 07/27/2012  . Smokeless tobacco: Never Used  . Alcohol use No     Allergies   Ketorolac tromethamine; Lortab [hydrocodone-acetaminophen]; and Tramadol  Review of Systems Review of Systems  Unable to perform ROS: Acuity of condition  Cardiovascular: Positive for chest pain and leg swelling.  Musculoskeletal: Positive for gait problem.  Neurological: Positive for speech difficulty and numbness.    Physical Exam Updated Vital Signs BP 125/77 (BP Location: Left Arm)   Pulse 80   Temp 98.7 F (37.1 C) (Oral)   Resp 18   Ht 5\' 4"  (1.626 m)   Wt 126.6 kg   SpO2 96%   BMI 47.89 kg/m   Physical Exam  Constitutional: She is oriented to person, place, and time. She appears well-developed and well-nourished. No distress.  Nontoxic appearing. Obese.  HENT:  Head: Normocephalic and atraumatic.  Symmetric rise of the uvula with phonation  Eyes: Conjunctivae and EOM are normal. Pupils are equal, round, and reactive to light. No scleral icterus.  EOMs normal. No nystagmus.  Neck: Normal range of motion.  Cardiovascular: Normal rate, regular rhythm and intact distal pulses.   Pulmonary/Chest: Effort normal. No respiratory distress.  Respirations even and unlabored  Musculoskeletal: Normal range of motion.  Neurological: She is alert and oriented to person, place, and time. No cranial nerve deficit.  GCS 15. Patient answers questions appropriately and follows commands. No focal neurologic deficits appreciated; symmetric eyebrow raise, no facial drooping, tongue midline. Patient has equal grip strength bilaterally with 5/5 strength against resistance in all major muscle groups bilaterally. Patient reports subjective decreased sensation in her left lower extremity; however, she reports normal sensation in her left ankle. Patient able to wiggle all toes. She moves extremities without ataxia.  Skin: Skin is warm and dry. No rash  noted. She is not diaphoretic. No erythema. No pallor.  Psychiatric: She has a normal mood and affect. Her behavior is normal.  Nursing note and vitals reviewed.     ED Treatments / Results  DIAGNOSTIC STUDIES: Oxygen Saturation is 100% on RA, normal by my interpretation.   COORDINATION OF CARE: 9:35 PM-Discussed next steps with pt. Pt verbalized understanding and is agreeable with the plan.   Labs (all labs ordered are listed, but only abnormal results are displayed) Labs Reviewed  BASIC METABOLIC PANEL - Abnormal; Notable for the following:       Result Value   Potassium 3.3 (*)    Glucose, Bld 105 (*)    All other components within normal limits  CBC - Abnormal; Notable for the following:    RBC 3.43 (*)    Hemoglobin 10.6 (*)    HCT 32.8 (*)    All other  components within normal limits  URINE RAPID DRUG SCREEN, HOSP PERFORMED - Abnormal; Notable for the following:    Tetrahydrocannabinol POSITIVE (*)    All other components within normal limits  ACETAMINOPHEN LEVEL - Abnormal; Notable for the following:    Acetaminophen (Tylenol), Serum <10 (*)    All other components within normal limits  I-STAT CG4 LACTIC ACID, ED - Abnormal; Notable for the following:    Lactic Acid, Venous 2.06 (*)    All other components within normal limits  CBG MONITORING, ED - Abnormal; Notable for the following:    Glucose-Capillary 121 (*)    All other components within normal limits  BRAIN NATRIURETIC PEPTIDE  ETHANOL  SALICYLATE LEVEL  I-STAT TROPOININ, ED  I-STAT CG4 LACTIC ACID, ED  I-STAT CG4 LACTIC ACID, ED    EKG  EKG Interpretation None      Radiology Dg Chest 2 View  Result Date: 11/04/2015 CLINICAL DATA:  Slurred speech and centralized chest pain, leg swelling, tremor, lethargy. History of epilepsy, brain tumor, ovarian cancer. EXAM: CHEST  2 VIEW COMPARISON:  Chest radiograph October 13, 2015 FINDINGS: Cardiomediastinal silhouette is unremarkable for this low inspiratory  examination with crowded vasculature markings. The lungs are clear without pleural effusions or focal consolidations. Trachea projects midline and there is no pneumothorax. Included soft tissue planes and osseous structures are non-suspicious. Surgical clips in the included right abdomen compatible with cholecystectomy. Large body habitus. IMPRESSION: No acute cardiopulmonary process for this low inspiratory examination. Electronically Signed   By: Elon Alas M.D.   On: 11/04/2015 22:19   Ct Head Wo Contrast  Result Date: 11/04/2015 CLINICAL DATA:  30 year old female with numbness and slurred speech. EXAM: CT HEAD WITHOUT CONTRAST TECHNIQUE: Contiguous axial images were obtained from the base of the skull through the vertex without intravenous contrast. COMPARISON:  Head CT dated 09/01/2015 FINDINGS: The ventricles and the sulci are appropriate in size for the patient's age. There is no intracranial hemorrhage. No midline shift or mass effect identified. The gray-white matter differentiation is preserved. The visualized paranasal sinuses and mastoid air cells are well aerated. The calvarium is intact. Hyperostosis frontalis. IMPRESSION: No acute intracranial pathology. Electronically Signed   By: Anner Crete M.D.   On: 11/04/2015 22:48    Procedures Procedures (including critical care time)  Medications Ordered in ED Medications  sodium chloride 0.9 % bolus 1,000 mL (0 mLs Intravenous Stopped 11/05/15 0140)    Initial Impression / Assessment and Plan / ED Course  I have reviewed the triage vital signs and the nursing notes.  Pertinent labs & imaging results that were available during my care of the patient were reviewed by me and considered in my medical decision making (see chart for details).  Clinical Course    30 year old female percent to the emergency department for evaluation of multiple complaints. She has a psychiatric history including depression, anxiety, and  schizoaffective disorder. Patient recently seen here 4 days ago for complaints of vaginal discharge. Today, patient presents complaining of chest pain as well as numbness in her left lower extremity. On exam patient is able to express pain when lifting her left leg, however. She has no focal neurologic deficits on physical exam today.  Cardiac workup today is reassuring. I have a low suspicion for cardiac etiology of chest pain in this patient. Age and risk factors place patient at low risk for acute coronary event. Chest x-ray shows no evidence of mediastinal widening, pneumothorax, pleural effusion, focal consolidation or pneumonia.  CT head was obtained given complaints of numbness. This is negative for mass, hydrocephalus, hemorrhage, or ischemic stroke.  Laboratory workup significant only for a mildly elevated lactic acid level. This cleared appropriately with IV fluids. Patient has been monitored in the emergency department with no decompensation in her status. Suspect symptoms to be psychosomatic. Patient has been referred to her primary care doctor for follow-up. I do not believe further emergent workup is indicated. Patient evaluated also by my attending who is in agreement with this workup, assessment, management plan, and patient's stability for discharge.   Final Clinical Impressions(s) / ED Diagnoses   Final diagnoses:  Chest pain, unspecified chest pain type    New Prescriptions Discharge Medication List as of 11/05/2015 12:53 AM      I personally performed the services described in this documentation, which was scribed in my presence. The recorded information has been reviewed and is accurate.       Antonietta Breach, PA-C 11/05/15 Mifflin Liu, MD 11/06/15 [phone]

## 2015-11-04 NOTE — ED Triage Notes (Addendum)
Pt friend states that patient has not been herself since 1400 today. Pt has slurred speech and centralized chest pain at this time. Pt also complaining of increase leg swelling. Pt denies taking any medication or drugs today. Pt also developing a tremor and appears to have increased lethargy. Pt not alert and did not answer orientation questions.

## 2015-11-05 LAB — I-STAT CG4 LACTIC ACID, ED: Lactic Acid, Venous: 0.78 mmol/L (ref 0.5–1.9)

## 2015-11-05 NOTE — ED Notes (Signed)
Pt ambulatory to BR with steady gait.

## 2015-11-05 NOTE — ED Notes (Signed)
Pt c/o chest discomfort 9/10 and compete numbness to lower extremities. Pt continues to have thick tongued speech, visitor at bedside

## 2015-11-05 NOTE — ED Notes (Signed)
During d/c patient noted to have thick tongued speech when speaking to me but speech was clear when speaking to friend in room.  Pt easily got herself up from stretcher, dressed herself and walked to bathroom with swift steady gait.

## 2015-11-05 NOTE — ED Notes (Signed)
Repeat lactic drawn, pt c/o tightness and pain to chest and legs, pt requesting something for pain stating her PCP usually gives her Percocet. Pt lifted leg from stretcher to show me swelling to lower extremities.

## 2015-11-05 NOTE — ED Notes (Signed)
Per charge RN ok for pt and visitor to stay in room until bus starts running in AM

## 2015-11-08 ENCOUNTER — Encounter: Payer: Self-pay | Admitting: Pediatric Intensive Care

## 2015-11-08 DIAGNOSIS — Z139 Encounter for screening, unspecified: Secondary | ICD-10-CM

## 2015-11-14 ENCOUNTER — Encounter: Payer: Self-pay | Admitting: Pediatric Intensive Care

## 2015-11-14 DIAGNOSIS — Z139 Encounter for screening, unspecified: Secondary | ICD-10-CM

## 2015-11-14 NOTE — Congregational Nurse Program (Signed)
Congregational Nurse Program Note  Date of Encounter: 11/08/2015  Past Medical History: Past Medical History:  Diagnosis Date  . Anxiety   . Brain tumor ([place])   . Cancer ([place]) 1991   ovarian  . Depression   . 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 Kidney stone   . Kidney stones   . Ovarian cancer ([place])   . Schizoaffective disorder   . Seizures ([place])     Encounter Details:     CNP Questionnaire - 11/07/15 1145      Patient Demographics   Is this a new or existing patient? New   Patient is considered a/an Not Applicable   Race African-American/Black     Patient Assistance   Location of Patient Assistance [facility]   Patient's financial/insurance status Medicaid   Uninsured Patient No   Patient referred to apply for the following financial assistance Not Applicable   Food insecurities addressed Not Applicable   Transportation assistance Yes   Type of Assistance Bus Pass Given   Assistance securing medications No   Educational health offerings Navigating the healthcare system     Encounter Details   Primary purpose of visit [place]   Was an Emergency Department visit averted? Not Applicable   Does patient have a medical provider? Yes   Patient referred to Not Applicable   Was a mental health screening completed? (GAINS tool) No   Does patient have dental issues? No   Does patient have vision issues? No   Does your patient have an abnormal blood pressure today? No   Since previous encounter, have you referred patient for abnormal blood pressure that resulted in a new diagnosis or medication change? No   Does your patient have an abnormal blood glucose today? No   Since previous encounter, have you referred patient for abnormal blood glucose that resulted in a new diagnosis or medication change? No   Was there a life-saving intervention made? No     Client states she had an ED visit recently and requests CN check on lab results.  Client states that she is out of behavioral health medication. She was previously seen at [facility] but states her doctor is no longer there. Client states that she has an appointment with her medical provider this afternoon. CN suggested that client speak with her provider about her behavioral health meds until she can find a new behavioral health provider. CN called [facility] for client and client says she will follow up there.

## 2015-11-14 NOTE — Congregational Nurse Program (Signed)
Congregational Nurse Program Note  Date of Encounter: 11/14/2015  Past Medical History: Past Medical History:  Diagnosis Date  . Anxiety   . Brain tumor ([place])   . Cancer ([place]) 1991   ovarian  . Depression   . 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 Kidney stone   . Kidney stones   . Ovarian cancer ([place])   . Schizoaffective disorder   . Seizures ([place])     Encounter Details:     CNP Questionnaire - 11/14/15 1232      Patient Demographics   Is this a new or existing patient? Existing   Patient is considered a/an Not Applicable   Race African-American/Black     Patient Assistance   Location of Patient Assistance [facility]   Patient's financial/insurance status Medicaid   Uninsured Patient No   Patient referred to apply for the following financial assistance Not Applicable   Food insecurities addressed Not Applicable   Transportation assistance Yes   Type of Assistance Bus Pass Given   Assistance securing medications No   Educational health offerings Navigating the healthcare system;Hypertension     Encounter Details   Primary purpose of visit Acute Illness/Condition Visit;Navigating the Healthcare System   Was an Emergency Department visit averted? Not Applicable   Does patient have a medical provider? Yes   Patient referred to Follow up with established PCP;Clinic   Was a mental health screening completed? (GAINS tool) No   Does patient have dental issues? No   Does patient have vision issues? No   Does your patient have an abnormal blood pressure today? No   Since previous encounter, have you referred patient for abnormal blood pressure that resulted in a new diagnosis or medication change? No   Does your patient have an abnormal blood glucose today? No   Since previous encounter, have you referred patient for abnormal blood glucose that resulted in a new diagnosis or medication change? No   Was there a life-saving intervention made? No     Client in  for BP check. States that she has PCP follow up tomorrow, behavioral health appointment on Wednesday. States that her PCP started her on HCTZ due to increased lower extremity edema and suggested bedrest. Client has pitting edema to shins. Referral to front desk for bedrest in lobby. Client will have PCP write bedrest orders as well. Client to follow up with CN on Friday for BP check.

## 2015-11-16 DIAGNOSIS — Z8659 Personal history of other mental and behavioral disorders: Secondary | ICD-10-CM

## 2015-11-16 MED FILL — SERTRALINE HCL 50 MG TABLET: 50 | 30 days supply | Qty: 30 | Fill #0

## 2015-11-16 MED FILL — ARIPiprazole 10 MG TABS: 10 | 30 days supply | Qty: 30 | Fill #0

## 2015-11-16 MED FILL — PRAZOSIN 1 MG CAPSULE: 1 | 15 days supply | Qty: 30 | Fill #0

## 2015-11-16 MED FILL — HYDROCHLOROTHIAZIDE 25 MG T: 25 | 30 days supply | Qty: 30 | Fill #0

## 2015-11-16 MED FILL — QUETIAPINE FUMARATE 100 MG: 100 | 30 days supply | Qty: 30 | Fill #0

## 2015-11-16 MED FILL — traZODone HCL 100 MG TABS: 100 | 30 days supply | Qty: 30 | Fill #0

## 2015-12-01 NOTE — Congregational Nurse Program (Signed)
Congregational Nurse Program Note  Date of Encounter: 11/16/2015  Past Medical History: Past Medical History:  Diagnosis Date  . Anxiety   . Brain tumor ([place])   . Cancer ([place]) 1991   ovarian  . Depression   . 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 Kidney stone   . Kidney stones   . Ovarian cancer ([place])   . Schizoaffective disorder   . Seizures ([place])     Encounter Details:     CNP Questionnaire - 11/16/15 1110      Patient Demographics   Is this a new or existing patient? New   Patient is considered a/an Not Applicable   Race African-American/Black     Patient Assistance   Location of Patient Assistance Not Applicable   Patient's financial/insurance status Medicaid   Uninsured Patient No   Patient referred to apply for the following financial assistance Not Applicable   Food insecurities addressed Not Applicable   Transportation assistance No   Assistance securing medications Yes   Type of Assistance [facility]   Educational health offerings Behavioral health;Medications     Encounter Details   Primary purpose of visit Chronic Illness/Condition Visit   Was an Emergency Department visit averted? Not Applicable   Does patient have a medical provider? Yes   Patient referred to Follow up with established PCP   Was a mental health screening completed? (GAINS tool) No   Does patient have dental issues? No   Does patient have vision issues? No   Does your patient have an abnormal blood pressure today? No   Since previous encounter, have you referred patient for abnormal blood pressure that resulted in a new diagnosis or medication change? No   Does your patient have an abnormal blood glucose today? No   Since previous encounter, have you referred patient for abnormal blood glucose that resulted in a new diagnosis or medication change? No   Was there a life-saving intervention made? No     States is experiencing self more anxious and easily  triggered.  Hx of PTSD.  Has not had meds for several weeks.  Was unable to fill prescriptions following inpatient at [facility].  Has an appointment with new mental health provider within a couple of weeks.  Meds filled at [facility].  Instructions about meds given to client

## 2015-12-02 ENCOUNTER — Encounter: Payer: Self-pay | Admitting: Pediatric Intensive Care

## 2016-01-02 NOTE — Congregational Nurse Program (Signed)
Congregational Nurse Program Note  Date of Encounter: 12/02/2015 Requesting bus tickets to go to a substance abuse counseling appointment.  States is "not doing well".  States is experiencing more agitation.  States smokes "weed" to decrease agitation.  Encouraged her to discuss her use of "weed" with her substance abuse counselor   Past Medical History:  Diagnosis Date  . Anxiety   . Brain tumor ([place])   . Cancer ([place]) 1991   ovarian  . Depression   . 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 Kidney stone   . Kidney stones   . Ovarian cancer ([place])   . Schizoaffective disorder   . Seizures ([place])     Encounter Details:

## 2016-01-04 NOTE — Congregational Nurse Program (Signed)
Congregational Nurse Program Note  Date of Encounter: 12/02/2015  Past Medical History: Past Medical History:  Diagnosis Date  . Anxiety   . Brain tumor ([place])   . Cancer ([place]) 1991   ovarian  . Depression   . 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 Kidney stone   . Kidney stones   . Ovarian cancer ([place])   . Schizoaffective disorder   . Seizures ([place])     Encounter Details:  States she is having medication side effects. Directed to discuss with medical provider at her appointment next week. Client states that she is supposed to use nebulizer 2x per day but does not currently have a machine. Given number for [facility] to follow up.

## 2016-03-05 ENCOUNTER — Ambulatory Visit (HOSPITAL_COMMUNITY)
Admission: EM | Admit: 2016-03-05 | Discharge: 2016-03-05 | Disposition: A | Discharge status: Home / Self Care | Payer: Medicaid Other | Attending: Emergency Medicine | Admitting: Emergency Medicine

## 2016-03-05 ENCOUNTER — Encounter (HOSPITAL_COMMUNITY): Payer: Self-pay | Admitting: Emergency Medicine

## 2016-03-05 DIAGNOSIS — K529 Noninfective gastroenteritis and colitis, unspecified: Secondary | ICD-10-CM | POA: Diagnosis not present

## 2016-03-05 DIAGNOSIS — B349 Viral infection, unspecified: Secondary | ICD-10-CM | POA: Diagnosis not present

## 2016-03-05 MED ORDER — ONDANSETRON HCL 4 MG PO TABS: 4.0000 mg | ORAL_TABLET | Freq: Four times a day (QID) | ORAL | 0 refills | Status: DC

## 2016-03-05 MED ORDER — BENZONATATE 100 MG PO CAPS: 100.0000 mg | ORAL_CAPSULE | Freq: Three times a day (TID) | ORAL | 0 refills | Status: DC | PRN

## 2016-03-05 NOTE — Discharge Instructions (Signed)
Recommend primarily clear liquids and gradually advanced eye as tolerated. Read accompanying information regarding food choices to help relieve diarrhea. Take Tylenol as needed for discomfort or fevers. Zofran for nausea or vomiting. He may take 1 Imodium today and one this afternoon if you are still having diarrhea but do not take anymore to stop it. If needed to contact Claritin or Zyrtec for drainage as well as the Tessalon Perles for cough and congestion. Follow up with your primary care doctor as needed.

## 2016-03-05 NOTE — ED Provider Notes (Signed)
CSN: [id number]     Arrival date & time 03/05/16  1155 History   First MD Initiated Contact with Patient 03/05/16 1321     Chief Complaint  Patient presents with  . Cough   (Consider location/radiation/quality/duration/timing/severity/associated sxs/prior Treatment) 31 year old female complaining of a four-day history of "feeling cold" achiness, low-grade fever and vomiting food. She states she is able to hold down liquids without vomiting. She said 3 episodes in the past 24 hours. She also has had diarrhea in the morning for the past 2 days. She states her temperature at home was 100.1.      Past Medical History:  Diagnosis Date  . Anxiety   . Brain tumor ([place])   . Cancer ([place]) 1991   ovarian  . Depression   . 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 Kidney stone   . Kidney stones   . Ovarian cancer ([place])   . Schizoaffective disorder   . Seizures ([place])    Past Surgical History:  Procedure Laterality Date  . ABDOMINAL HYSTERECTOMY     partial; took fallopian tubes and ovaries  . APPENDECTOMY    . kidney stone removal     Family History  Problem Relation Age of Onset  . Diabetes type II Other   . Asthma Mother    Social History  Substance Use Topics  . Smoking status: Heavy Tobacco Smoker    Packs/day: 2.00    Years: 2.00    Types: Cigarettes    Last attempt to quit: 07/27/2012  . Smokeless tobacco: Never Used  . Alcohol use No   OB History    Gravida Para Term Preterm AB Living   0 0 0 0 0 0   SAB TAB Ectopic Multiple Live Births   0 0 0 0       Review of Systems  Constitutional: Positive for activity change, fatigue and fever. Negative for appetite change and chills.  HENT: Positive for congestion, postnasal drip, rhinorrhea and sneezing. Negative for facial swelling.   Eyes: Negative.   Respiratory: Positive for cough.   Cardiovascular: Negative.   Musculoskeletal: Positive for myalgias. Negative for neck pain and neck stiffness.  Skin:  Negative for pallor and rash.  Neurological: Negative.     Allergies  Ketorolac tromethamine; Lortab [hydrocodone-acetaminophen]; and Tramadol  Home Medications   Prior to Admission medications   Medication Sig Start Date End Date Taking? Authorizing Provider  ARIPiprazole (ABILIFY) 10 MG tablet Take 1 tablet (10 mg total) by mouth daily. 09/02/15  Yes Jolanta B Pucilowska, MD  hydrochlorothiazide (HYDRODIURIL) 25 MG tablet Take 1 tablet (25 mg total) by mouth daily. 09/02/15  Yes Jolanta B Pucilowska, MD  oxyCODONE-acetaminophen (PERCOCET) 7.5-325 MG tablet Take 1 tablet by mouth 3 (three) times daily as needed for pain. 09/08/15  Yes Historical Provider, MD  potassium chloride SA (K-DUR,KLOR-CON) 20 MEQ tablet Take 1 tablet (20 mEq total) by mouth daily. 10/13/15  Yes John Molpus, MD  QUEtiapine (SEROQUEL) 100 MG tablet Take 1 tablet (100 mg total) by mouth at bedtime. 09/02/15  Yes Jolanta B Pucilowska, MD  sertraline (ZOLOFT) 50 MG tablet Take 1 tablet (50 mg total) by mouth daily. 09/02/15  Yes Jolanta B Pucilowska, MD  benzonatate (TESSALON PERLES) 100 MG capsule Take 1 capsule (100 mg total) by mouth 3 (three) times daily as needed for cough. 03/05/16   Janne Napoleon, NP  ondansetron (ZOFRAN) 4 MG tablet Take 1 tablet (4 mg total) by mouth every  6 (six) hours. Prn N or V. 03/05/16   Janne Napoleon, NP   Meds Ordered and Administered this Visit  Medications - No data to display  BP 143/96 (BP Location: Left Arm) Comment (BP Location): large cuff  Pulse 83   Temp 98 F (36.7 C) (Oral)   Resp 20   SpO2 100%  No data found.   Physical Exam  Constitutional: She is oriented to person, place, and time. She appears well-developed and well-nourished. No distress.  HENT:  Head: Normocephalic and atraumatic.  Right Ear: External ear normal.  Left Ear: External ear normal.  Mouth/Throat: No oropharyngeal exudate.  Bilateral TMs are normal. Oropharynx with cobblestoning and clear PND.   Eyes: EOM are  normal. Pupils are equal, round, and reactive to light.  Neck: Normal range of motion. Neck supple.  Cardiovascular: Normal rate, regular rhythm and normal heart sounds.   Pulmonary/Chest: Effort normal and breath sounds normal. No respiratory distress. She has no wheezes. She has no rales.  Abdominal: Soft. She exhibits no distension. There is no tenderness. There is no rebound.  Musculoskeletal: Normal range of motion. She exhibits no edema.  Lymphadenopathy:    She has no cervical adenopathy.  Neurological: She is alert and oriented to person, place, and time.  Skin: Skin is warm and dry.  Psychiatric: She has a normal mood and affect.  Nursing note and vitals reviewed.   Urgent Care Course   Clinical Course     Procedures (including critical care time)  Labs Review Labs Reviewed - No data to display  Imaging Review No results found.   Visual Acuity Review  Right Eye Distance:   Left Eye Distance:   Bilateral Distance:    Right Eye Near:   Left Eye Near:    Bilateral Near:         MDM   1. Viral illness   2. Gastroenteritis    Recommend primarily clear liquids and gradually advanced eye as tolerated. Read accompanying information regarding food choices to help relieve diarrhea. Take Tylenol as needed for discomfort or fevers. Zofran for nausea or vomiting. He may take 1 Imodium today and one this afternoon if you are still having diarrhea but do not take anymore to stop it. If needed to contact Claritin or Zyrtec for drainage as well as the Tessalon Perles for cough and congestion. Follow up with your primary care doctor as needed. Meds ordered this encounter  Medications  . ondansetron (ZOFRAN) 4 MG tablet    Sig: Take 1 tablet (4 mg total) by mouth every 6 (six) hours. Prn N or V.    Dispense:  12 tablet    Refill:  0    Order Specific Question:   Supervising Provider    Answer:   Melony Overly [id number]  . benzonatate (TESSALON PERLES) 100 MG capsule     Sig: Take 1 capsule (100 mg total) by mouth 3 (three) times daily as needed for cough.    Dispense:  20 capsule    Refill:  0    Order Specific Question:   Supervising Provider    Answer:   Melony Overly [id number]       Janne Napoleon, NP 03/05/16 1409

## 2016-03-05 NOTE — ED Triage Notes (Signed)
The patient presented to the [facility] with a complaint of a cough and general body aches x 4 days.

## 2016-05-01 ENCOUNTER — Encounter (HOSPITAL_COMMUNITY): Payer: Self-pay | Admitting: *Deleted

## 2016-05-01 ENCOUNTER — Emergency Department (HOSPITAL_COMMUNITY)
Admission: EM | Admit: 2016-05-01 | Discharge: 2016-05-01 | Disposition: A | Discharge status: Home / Self Care | Payer: Medicaid Other | Attending: Emergency Medicine | Admitting: Emergency Medicine

## 2016-05-01 DIAGNOSIS — F4320 Adjustment disorder, unspecified: Secondary | ICD-10-CM | POA: Insufficient documentation

## 2016-05-01 DIAGNOSIS — F17210 Nicotine dependence, cigarettes, uncomplicated: Secondary | ICD-10-CM | POA: Insufficient documentation

## 2016-05-01 DIAGNOSIS — Z8543 Personal history of malignant neoplasm of ovary: Secondary | ICD-10-CM | POA: Insufficient documentation

## 2016-05-01 DIAGNOSIS — I10 Essential (primary) hypertension: Secondary | ICD-10-CM | POA: Insufficient documentation

## 2016-05-01 DIAGNOSIS — R45851 Suicidal ideations: Secondary | ICD-10-CM | POA: Diagnosis present
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
## 2016-05-01 NOTE — Discharge Instructions (Signed)
Read the information below.  You may return to the Emergency Department at any time for worsening condition or any new symptoms that concern you.   If you start feeling unsafe at home, or feel like hurting yourself or others, please call 911 and return to the Emergency Department immediately.
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
## 2017-02-07 ENCOUNTER — Encounter (HOSPITAL_COMMUNITY): Payer: Self-pay | Admitting: Emergency Medicine

## 2017-02-07 ENCOUNTER — Emergency Department (HOSPITAL_COMMUNITY)
Admission: EM | Admit: 2017-02-07 | Discharge: 2017-02-08 | Disposition: A | Discharge status: Home / Self Care | Payer: Medicaid Other | Attending: Emergency Medicine | Admitting: Emergency Medicine

## 2017-02-07 DIAGNOSIS — N390 Urinary tract infection, site not specified: Secondary | ICD-10-CM | POA: Diagnosis not present

## 2017-02-07 DIAGNOSIS — F17210 Nicotine dependence, cigarettes, uncomplicated: Secondary | ICD-10-CM | POA: Insufficient documentation

## 2017-02-07 DIAGNOSIS — I10 Essential (primary) hypertension: Secondary | ICD-10-CM | POA: Diagnosis not present

## 2017-02-07 DIAGNOSIS — Z8543 Personal history of malignant neoplasm of ovary: Secondary | ICD-10-CM | POA: Insufficient documentation

## 2017-02-07 DIAGNOSIS — Z79899 Other long term (current) drug therapy: Secondary | ICD-10-CM | POA: Insufficient documentation

## 2017-02-07 DIAGNOSIS — R319 Hematuria, unspecified: Secondary | ICD-10-CM | POA: Diagnosis present

## 2017-02-07 MED ORDER — HYDROMORPHONE HCL 1 MG/ML IJ SOLN
1.0000 mg | Freq: Once | INTRAMUSCULAR | Status: AC
Start: 2017-02-07 — End: 2017-02-08
  Administered 2017-02-08: 1 mg via INTRAVENOUS
  Filled 2017-02-07: qty 1

## 2017-02-07 MED ORDER — ONDANSETRON HCL 4 MG/2ML IJ SOLN
4.0000 mg | Freq: Once | INTRAMUSCULAR | Status: AC
  Administered 2017-02-08: 4 mg via INTRAVENOUS
  Filled 2017-02-07: qty 2

## 2017-02-07 NOTE — ED Triage Notes (Signed)
Pt comes in with complaints of left flank pain and emesis for the past 2 days.  States she has had less urine output than usual. Hx of kidney stones and states this feels the same.  Pt states last month she went to Dr. Noah Delaine and he did an ultrasound and stated she had a golf sized kidney stone in her kidney.  Pt very hoarse from coughing and throwing up. Afebrile on assessment.

## 2017-02-07 NOTE — ED Notes (Signed)
Writer went to obtain lab draw but Scientist, research (medical) that she would obtain lab draw at start of IV.

## 2017-02-08 ENCOUNTER — Emergency Department (HOSPITAL_COMMUNITY): Payer: Medicaid Other

## 2017-02-08 ENCOUNTER — Encounter (HOSPITAL_COMMUNITY): Payer: Self-pay

## 2017-02-08 LAB — BASIC METABOLIC PANEL
Anion gap: 9 (ref 5–15)
BUN: 20 mg/dL (ref 6–20)
CHLORIDE: 106 mmol/L (ref 101–111)
CO2: 26 mmol/L (ref 22–32)
Calcium: 9.2 mg/dL (ref 8.9–10.3)
Creatinine, Ser: 0.94 mg/dL (ref 0.44–1.00)
GFR calc non Af Amer: >60 mL/min (ref >60)
Glucose, Bld: 108 mg/dL — ABNORMAL HIGH (ref 65–99)
POTASSIUM: 3.3 mmol/L — AB (ref 3.5–5.1)
SODIUM: 141 mmol/L (ref 135–145)

## 2017-02-08 LAB — CBC
HEMATOCRIT: 32.6 % — AB (ref 36.0–46.0)
HEMOGLOBIN: 10.4 g/dL — AB (ref 12.0–15.0)
MCH: 30.6 pg (ref 26.0–34.0)
MCHC: 31.9 g/dL (ref 30.0–36.0)
MCV: 95.9 fL (ref 78.0–100.0)
Platelets: 305 10*3/uL (ref 150–400)
RBC: 3.4 MIL/uL — AB (ref 3.87–5.11)
RDW: 12.9 % (ref 11.5–15.5)
WBC: 9.5 10*3/uL (ref 4.0–10.5)

## 2017-02-08 LAB — URINALYSIS, MICROSCOPIC (REFLEX)

## 2017-02-08 LAB — URINALYSIS, ROUTINE W REFLEX MICROSCOPIC
Bilirubin Urine: NEGATIVE
Glucose, UA: NEGATIVE mg/dL
Ketones, ur: NEGATIVE mg/dL
NITRITE: NEGATIVE
PH: 6 (ref 5.0–8.0)
Specific Gravity, Urine: >1.03 — ABNORMAL HIGH (ref 1.005–1.030)

## 2017-02-08 MED ORDER — IBUPROFEN 800 MG PO TABS: 800.0000 mg | ORAL_TABLET | Freq: Three times a day (TID) | ORAL | 0 refills | Status: DC

## 2017-02-08 MED ORDER — CEPHALEXIN 500 MG PO CAPS: 500.0000 mg | ORAL_CAPSULE | Freq: Three times a day (TID) | ORAL | 0 refills | Status: DC

## 2017-02-08 MED ORDER — PHENAZOPYRIDINE HCL 200 MG PO TABS: 200.0000 mg | ORAL_TABLET | Freq: Three times a day (TID) | ORAL | 0 refills | Status: DC | PRN

## 2017-02-08 MED ORDER — HYDROMORPHONE HCL 1 MG/ML IJ SOLN
1.0000 mg | Freq: Once | INTRAMUSCULAR | Status: AC
  Administered 2017-02-08: 1 mg via INTRAVENOUS
  Filled 2017-02-08: qty 1

## 2017-02-08 MED ORDER — IBUPROFEN 800 MG PO TABS: 800.0000 mg | ORAL_TABLET | Freq: Once | ORAL | Status: DC

## 2017-02-08 NOTE — Discharge Instructions (Signed)
Take the prescribed medication as directed. Follow-up with your primary care doctor.  I recommend that you touch base with your psychiatry team to see if they can refill your medications, if not then your primary doctor may can help with this. Return to the ED for new or worsening symptoms.

## 2017-02-08 NOTE — ED Provider Notes (Signed)
[facility] DEPT Provider Note   CSN: [id number] Arrival date & time: 02/07/17  2246     History   Chief Complaint Chief Complaint  Patient presents with  . Flank Pain  . Emesis    HPI [name] is a 31 y.o. female.  The history is provided by the patient and medical records.  Flank Pain   Emesis      31 year old female with history of ovarian cancer status post treatment, depression, kidney stones, schizoaffective disorder, seizures, presenting to the ED with left flank pain.  Patient reports this is been ongoing for about 2 days now.  States she feels like she cannot urinate but has been drinking water constantly.  Reports she has been seen in the ED before and told she did not have kidney stones, but reports she had an outpatient ultrasound with her primary care doctor that showed a "golf ball sized" stone in her left kidney.  Patient reports she has had some associated nausea and vomiting and now feels her voice is hoarse from this.  No fever or chills.  No pelvic pain or vaginal complaints.  Past Medical History:  Diagnosis Date  . Anxiety   . Brain tumor ([place])   . Cancer ([place]) 1991   ovarian  . Depression   . Epilepsy with grand mal seizures on awakening ([place])   . Kidney stone   . Kidney stones   . Ovarian cancer ([place])   . Schizoaffective disorder   . Seizures [facility])     Patient Active Problem List   Diagnosis Date Noted  . Tobacco use disorder 09/01/2015  . Cannabis use disorder, moderate, dependence ([place]) 09/01/2015  . OSA (obstructive sleep apnea) 10/07/2013  . Upper airway cough syndrome 09/03/2013  . Essential hypertension 09/03/2013  . Schizoaffective disorder, depressive type ([place]) 02/22/2011    Past Surgical History:  Procedure Laterality Date  . ABDOMINAL HYSTERECTOMY     partial; took fallopian tubes and ovaries  . APPENDECTOMY    . kidney stone removal      OB History    Gravida Para Term Preterm AB  Living   0 0 0 0 0 0   SAB TAB Ectopic Multiple Live Births   0 0 0 0         Home Medications    Prior to Admission medications   Medication Sig Start Date End Date Taking? Authorizing Provider  alprazolam Duanne Moron) 2 MG tablet Take 2 mg by mouth 3 (three) times daily. 04/05/16  Yes [provider]  amphetamine-dextroamphetamine (ADDERALL) 15 MG tablet Take 15 mg by mouth 3 (three) times daily. 03/26/16  Yes [provider]  hydrochlorothiazide (HYDRODIURIL) 25 MG tablet Take 1 tablet (25 mg total) by mouth daily. 09/02/15  Yes Pucilowska, Jolanta B, MD  oxyCODONE-acetaminophen (PERCOCET) 7.5-325 MG tablet Take 1 tablet by mouth 3 (three) times daily. 04/05/16  Yes [provider]  potassium chloride SA (K-DUR,KLOR-CON) 20 MEQ tablet Take 1 tablet (20 mEq total) by mouth daily. 10/13/15  Yes Molpus, John, MD  QUEtiapine (SEROQUEL) 100 MG tablet Take 1 tablet (100 mg total) by mouth at bedtime. 09/02/15  Yes Pucilowska, Jolanta B, MD  sertraline (ZOLOFT) 100 MG tablet Take 100 mg by mouth daily. 04/25/16  Yes [provider]  traZODone (DESYREL) 100 MG tablet Take 100 mg by mouth at bedtime. 04/25/16  Yes [provider]    Family History Family History  Problem Relation Age of Onset  .  Diabetes type II Other   . Asthma Mother     Social History Social History   Tobacco Use  . Smoking status: Heavy Tobacco Smoker    Packs/day: 2.00    Years: 2.00    Pack years: 4.00    Types: Cigarettes    Last attempt to quit: 07/27/2012    Years since quitting: 4.5  . Smokeless tobacco: Never Used  Substance Use Topics  . Alcohol use: No  . Drug use: No    Comment: Sts quit 2-3 months ago     Allergies   Ketorolac tromethamine; Lortab [hydrocodone-acetaminophen]; and Tramadol   Review of Systems Review of Systems  Gastrointestinal: Positive for nausea and vomiting.  Genitourinary: Positive for difficulty urinating and flank pain.  All other  systems reviewed and are negative.    Physical Exam Updated Vital Signs BP 115/74 (BP Location: Left Arm)   Pulse 89   Temp 98 F (36.7 C) (Oral)   Resp 19   Ht 5\' 4"  (1.626 m)   Wt 127 kg (280 lb)   SpO2 95%   BMI 48.06 kg/m   Physical Exam  Constitutional: She is oriented to person, place, and time. She appears well-developed and well-nourished.  obese  HENT:  Head: Normocephalic and atraumatic.  Mouth/Throat: Oropharynx is clear and moist.  Voice is hoarse without stridor  Eyes: Conjunctivae and EOM are normal. Pupils are equal, round, and reactive to light.  Neck: Normal range of motion.  Cardiovascular: Normal rate, regular rhythm and normal heart sounds.  Pulmonary/Chest: Effort normal and breath sounds normal. No stridor. No respiratory distress.  Abdominal: Soft. Bowel sounds are normal. There is no tenderness. There is CVA tenderness (left). There is no rigidity and no guarding.  Musculoskeletal: Normal range of motion.  Neurological: She is alert and oriented to person, place, and time.  Skin: Skin is warm and dry.  Psychiatric: She has a normal mood and affect.  Nursing note and vitals reviewed.    ED Treatments / Results  Labs (all labs ordered are listed, but only abnormal results are displayed) Labs Reviewed  URINALYSIS, ROUTINE W REFLEX MICROSCOPIC - Abnormal; Notable for the following components:      Result Value   APPearance CLOUDY (*)    Specific Gravity, Urine >1.030 (*)    Hgb urine dipstick TRACE (*)    Protein, ur TRACE (*)    Leukocytes, UA TRACE (*)    All other components within normal limits  CBC - Abnormal; Notable for the following components:   RBC 3.40 (*)    Hemoglobin 10.4 (*)    HCT 32.6 (*)    All other components within normal limits  BASIC METABOLIC PANEL - Abnormal; Notable for the following components:   Potassium 3.3 (*)    Glucose, Bld 108 (*)    All other components within normal limits  URINALYSIS, MICROSCOPIC  (REFLEX) - Abnormal; Notable for the following components:   Bacteria, UA MANY (*)    Squamous Epithelial / LPF TOO NUMEROUS TO COUNT (*)    All other components within normal limits    EKG  EKG Interpretation None       Radiology Ct Renal Stone Study  Result Date: 02/08/2017 CLINICAL DATA:  Left flank pain and nausea for 2 days. EXAM: CT ABDOMEN AND PELVIS WITHOUT CONTRAST TECHNIQUE: Multidetector CT imaging of the abdomen and pelvis was performed following the standard protocol without IV contrast. COMPARISON:  06/30/2013 FINDINGS: Lower chest: No acute abnormality.  Hepatobiliary: No focal liver abnormality is seen. No gallstones, gallbladder wall thickening, or biliary dilatation. Pancreas: Unremarkable. No pancreatic ductal dilatation or surrounding inflammatory changes. Spleen: Normal in size without focal abnormality. Adrenals/Urinary Tract: Adrenal glands are unremarkable. Kidneys are normal, without renal calculi, focal lesion, or hydronephrosis. Bladder is unremarkable. Stomach/Bowel: Stomach is within normal limits. Colon appears normal. No evidence of bowel wall thickening, distention, or inflammatory changes. Vascular/Lymphatic: No significant vascular findings are present. No enlarged abdominal or pelvic lymph nodes. Reproductive: No adnexal masses.  Partial hysterectomy. Other: No focal inflammation.  No ascites. Musculoskeletal: No significant skeletal lesion. IMPRESSION: No significant abnormality. Electronically Signed   By: Andreas Newport M.D.   On: 02/08/2017 01:42    Procedures Procedures (including critical care time)  Medications Ordered in ED Medications  HYDROmorphone (DILAUDID) injection 1 mg (1 mg Intravenous Given 02/08/17 0018)  ondansetron (ZOFRAN) injection 4 mg (4 mg Intravenous Given 02/08/17 0018)  HYDROmorphone (DILAUDID) injection 1 mg (1 mg Intravenous Given 02/08/17 0201)     Initial Impression / Assessment and Plan / ED Course  I have reviewed  the triage vital signs and the nursing notes.  Pertinent labs & imaging results that were available during my care of the patient were reviewed by me and considered in my medical decision making (see chart for details).  31 year old female here with left-sided flank pain for the past 2 days.  Reports associated nausea and vomiting and difficulty urinating.  She is afebrile and nontoxic.  Left CVA tenderness noted.  Her voice is hoarse but she has no stridor.  States she has been vomiting.  We will plan for screening labs, IV medications for symptom control.  CT renal study pending.  Lab work overall reassuring.  CT renal study without acute findings.  Patient's UA does appear infectious with many bacteria.  She is afebrile with a normal white blood cell count and no findings of pyelonephritis on CT.  Will treat with Keflex for UTI.  Patient also requesting refills of all of her psychiatric medications.  States she follows with Dr. in [place] and has not been able to see them.  I recommended that she call in try to schedule an appointment for the holidays to get her medications refilled, if not see if PCP can help assist with this temporarily.  She has no acute psychiatric complaints currently, does not appear to be a danger to herself or others.  Patient discharged home in stable condition.  She understands to return here for any new/worsening symptoms.  Final Clinical Impressions(s) / ED Diagnoses   Final diagnoses:  Urinary tract infection with hematuria, site unspecified    ED Discharge Orders        Ordered    cephALEXin (KEFLEX) 500 MG capsule  3 times daily     02/08/17 0419    phenazopyridine (PYRIDIUM) 200 MG tablet  3 times daily PRN     02/08/17 0419       Larene Pickett, PA-C 02/08/17 0429    Molpus, Jenny Reichmann, MD 02/08/17 [phone]

## 2017-06-19 ENCOUNTER — Encounter (HOSPITAL_COMMUNITY): Payer: Self-pay | Admitting: Emergency Medicine

## 2017-06-19 ENCOUNTER — Other Ambulatory Visit: Payer: Self-pay

## 2017-06-19 ENCOUNTER — Emergency Department (HOSPITAL_COMMUNITY): Payer: Medicaid Other

## 2017-06-19 ENCOUNTER — Emergency Department (HOSPITAL_COMMUNITY)
Admission: EM | Admit: 2017-06-19 | Discharge: 2017-06-20 | Disposition: A | Discharge status: Home / Self Care | Payer: Medicaid Other | Attending: Emergency Medicine | Admitting: Emergency Medicine

## 2017-06-19 DIAGNOSIS — I10 Essential (primary) hypertension: Secondary | ICD-10-CM | POA: Diagnosis not present

## 2017-06-19 DIAGNOSIS — Z79899 Other long term (current) drug therapy: Secondary | ICD-10-CM | POA: Insufficient documentation

## 2017-06-19 DIAGNOSIS — F17210 Nicotine dependence, cigarettes, uncomplicated: Secondary | ICD-10-CM | POA: Insufficient documentation

## 2017-06-19 DIAGNOSIS — K8050 Calculus of bile duct without cholangitis or cholecystitis without obstruction: Secondary | ICD-10-CM | POA: Diagnosis not present

## 2017-06-19 DIAGNOSIS — R1011 Right upper quadrant pain: Secondary | ICD-10-CM | POA: Diagnosis present

## 2017-06-19 LAB — COMPREHENSIVE METABOLIC PANEL
ALBUMIN: 4.1 g/dL (ref 3.5–5.0)
ALK PHOS: 73 U/L (ref 38–126)
ALT: 15 U/L (ref 14–54)
ANION GAP: 8 (ref 5–15)
AST: 17 U/L (ref 15–41)
BUN: 15 mg/dL (ref 6–20)
CALCIUM: 9 mg/dL (ref 8.9–10.3)
CHLORIDE: 110 mmol/L (ref 101–111)
CO2: 21 mmol/L — AB (ref 22–32)
Creatinine, Ser: 0.83 mg/dL (ref 0.44–1.00)
GFR calc non Af Amer: >60 mL/min (ref >60)
Glucose, Bld: 100 mg/dL — ABNORMAL HIGH (ref 65–99)
POTASSIUM: 3.6 mmol/L (ref 3.5–5.1)
SODIUM: 139 mmol/L (ref 135–145)
Total Bilirubin: 0.4 mg/dL (ref 0.3–1.2)
Total Protein: 8.1 g/dL (ref 6.5–8.1)

## 2017-06-19 LAB — CBC
HEMATOCRIT: 36.6 % (ref 36.0–46.0)
HEMOGLOBIN: 11.8 g/dL — AB (ref 12.0–15.0)
MCH: 30.9 pg (ref 26.0–34.0)
MCHC: 32.2 g/dL (ref 30.0–36.0)
MCV: 95.8 fL (ref 78.0–100.0)
Platelets: 345 10*3/uL (ref 150–400)
RBC: 3.82 MIL/uL — AB (ref 3.87–5.11)
RDW: 12.9 % (ref 11.5–15.5)
WBC: 5.7 10*3/uL (ref 4.0–10.5)

## 2017-06-19 LAB — LIPASE, BLOOD: LIPASE: 29 U/L (ref 11–51)

## 2017-06-19 MED ORDER — HYDROMORPHONE HCL 1 MG/ML IJ SOLN
0.5000 mg | Freq: Once | INTRAMUSCULAR | Status: AC
  Administered 2017-06-19: 0.5 mg via INTRAVENOUS
  Filled 2017-06-19: qty 1

## 2017-06-19 MED ORDER — SODIUM CHLORIDE 0.9 % IV BOLUS
1000.0000 mL | Freq: Once | INTRAVENOUS | Status: AC
  Administered 2017-06-19: 1000 mL via INTRAVENOUS

## 2017-06-19 MED ORDER — ONDANSETRON HCL 4 MG/2ML IJ SOLN
4.0000 mg | Freq: Once | INTRAMUSCULAR | Status: AC
  Administered 2017-06-19: 4 mg via INTRAVENOUS
  Filled 2017-06-19: qty 2

## 2017-06-19 MED ORDER — DICYCLOMINE HCL 10 MG/ML IM SOLN
20.0000 mg | Freq: Once | INTRAMUSCULAR | Status: AC
  Administered 2017-06-19: 20 mg via INTRAMUSCULAR
  Filled 2017-06-19: qty 2

## 2017-06-19 NOTE — ED Notes (Signed)
Pt is alert and oriented x 4 and is emotional and tearful. upset of her wait time. Pt was brought into fast track section in order to calm patient while waiting for a room in the back. Pt v/s obtained 161/104 HR 87 )2 saturation 100%.

## 2017-06-19 NOTE — ED Provider Notes (Signed)
[place] [facility] DEPT Provider Note   CSN: [id number] Arrival date & time: 06/19/17  1457    History   Chief Complaint Chief Complaint  Patient presents with  . Abdominal Pain    HPI [name] is a 32 y.o. female.   32 year old female with a history of epilepsy, brain tumor, schizoaffective disorder, anxiety, depression, ovarian cancer presents to the emergency department for right upper quadrant pain.  She reports intermittent pain over the past few years, worse over the past few days.  Symptoms associated with nausea as well as vomiting.  She reports vomiting while at her primary care office.  Patient also with intermittent, watery diarrhea which she compares to "oil".  No medications taken PTA for symptoms and no known sick contacts.  Patient denies fever.  Abdominal surgical hx significant for appendectomy and abdominal hysterectomy.  She says she was supposed to have her gallbladder removed for similar pain, but was "scared".     Past Medical History:  Diagnosis Date  . Anxiety   . Brain tumor ([place])   . Cancer ([place]) 1991   ovarian  . Depression   . 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 Kidney stone   . Kidney stones   . Ovarian cancer ([place])   . Schizoaffective disorder   . Seizures [facility])     Patient Active Problem List   Diagnosis Date Noted  . Tobacco use disorder 09/01/2015  . Cannabis use disorder, moderate, dependence ([place]) 09/01/2015  . OSA (obstructive sleep apnea) 10/07/2013  . Upper airway cough syndrome 09/03/2013  . Essential hypertension 09/03/2013  . Schizoaffective disorder, depressive type ([place]) 02/22/2011    Past Surgical History:  Procedure Laterality Date  . ABDOMINAL HYSTERECTOMY     partial; took fallopian tubes and ovaries  . APPENDECTOMY    . kidney stone removal       OB History    Gravida  0   Para  0   Term  0   Preterm  0   AB  0   Living  0     SAB  0   TAB  0     Ectopic  0   Multiple  0   Live Births               Home Medications    Prior to Admission medications   Medication Sig Start Date End Date Taking? Authorizing Provider  hydrochlorothiazide (HYDRODIURIL) 25 MG tablet Take 1 tablet (25 mg total) by mouth daily. 09/02/15  Yes Pucilowska, Jolanta B, MD  QUEtiapine (SEROQUEL) 100 MG tablet Take 1 tablet (100 mg total) by mouth at bedtime. 09/02/15  Yes Pucilowska, Jolanta B, MD  sertraline (ZOLOFT) 100 MG tablet Take 100 mg by mouth daily. 04/25/16  Yes [provider]  cephALEXin (KEFLEX) 500 MG capsule Take 1 capsule (500 mg total) by mouth 3 (three) times daily. Patient not taking: Reported on 06/19/2017 02/08/17   Larene Pickett, PA-C  dicyclomine (BENTYL) 20 MG tablet Take 1 tablet (20 mg total) by mouth every 12 (twelve) hours as needed (abdominal pain/cramping). 06/20/17   Antonietta Breach, PA-C  ibuprofen (ADVIL,MOTRIN) 800 MG tablet Take 1 tablet (800 mg total) by mouth 3 (three) times daily. Patient not taking: Reported on 06/19/2017 02/08/17   Larene Pickett, PA-C  phenazopyridine (PYRIDIUM) 200 MG tablet Take 1 tablet (200 mg total) by mouth 3 (three) times daily as needed for pain. Patient not  taking: Reported on 06/19/2017 02/08/17   Larene Pickett, PA-C  potassium chloride SA (K-DUR,KLOR-CON) 20 MEQ tablet Take 1 tablet (20 mEq total) by mouth daily. Patient not taking: Reported on 06/19/2017 10/13/15   Molpus, Jenny Reichmann, MD  promethazine (PHENERGAN) 25 MG tablet Take 1 tablet (25 mg total) by mouth every 6 (six) hours as needed for nausea or vomiting. 06/20/17   Antonietta Breach, PA-C    Family History Family History  Problem Relation Age of Onset  . Diabetes type II Other   . Asthma Mother     Social History Social History   Tobacco Use  . Smoking status: Heavy Tobacco Smoker    Packs/day: 2.00    Years: 2.00    Pack years: 4.00    Types: Cigarettes    Last attempt to quit: 07/27/2012    Years since quitting: 4.9  .  Smokeless tobacco: Never Used  Substance Use Topics  . Alcohol use: No  . Drug use: No    Frequency: 7.0 times per week    Comment: Sts quit 2-3 months ago     Allergies   Ketorolac tromethamine; Lortab [hydrocodone-acetaminophen]; and Tramadol   Review of Systems Review of Systems Ten systems reviewed and are negative for acute change, except as noted in the HPI.    Physical Exam Updated Vital Signs BP (!) 152/98   Pulse 89   Temp 98.3 F (36.8 C)   Resp (!) 22   Ht 5\' 4"  (1.626 m)   Wt 118.4 kg (261 lb)   SpO2 94%   BMI 44.80 kg/m   Physical Exam  Constitutional: She is oriented to person, place, and time. She appears well-developed and well-nourished. No distress.  Nontoxic appearing and in NAD  HENT:  Head: Normocephalic and atraumatic.  Eyes: Conjunctivae and EOM are normal. No scleral icterus.  Neck: Normal range of motion.  Cardiovascular: Normal rate, regular rhythm and intact distal pulses.  Pulmonary/Chest: Effort normal. No stridor. No respiratory distress. She has no wheezes.  Respirations even and unlabored  Abdominal: Soft. She exhibits no mass. There is tenderness.  RUQ tenderness to palpation with negative Murphy's sign. No peritoneal signs or palpable masses. Abdomen obese.  Musculoskeletal: Normal range of motion.  Neurological: She is alert and oriented to person, place, and time. She exhibits normal muscle tone. Coordination normal.  Skin: Skin is warm and dry. No rash noted. She is not diaphoretic. No erythema. No pallor.  Psychiatric: She has a normal mood and affect. Her behavior is normal.  Nursing note and vitals reviewed.    ED Treatments / Results  Labs (all labs ordered are listed, but only abnormal results are displayed) Labs Reviewed  COMPREHENSIVE METABOLIC PANEL - Abnormal; Notable for the following components:      Result Value   CO2 21 (*)    Glucose, Bld 100 (*)    All other components within normal limits  CBC - Abnormal;  Notable for the following components:   RBC 3.82 (*)    Hemoglobin 11.8 (*)    All other components within normal limits  URINALYSIS, ROUTINE W REFLEX MICROSCOPIC - Abnormal; Notable for the following components:   APPearance HAZY (*)    Hgb urine dipstick SMALL (*)    Leukocytes, UA SMALL (*)    Bacteria, UA RARE (*)    All other components within normal limits  LIPASE, BLOOD    EKG None  Radiology US Abdomen Complete  Result Date: 06/20/2017 CLINICAL DATA:  Initial evaluation for acute right upper quadrant pain. EXAM: ABDOMEN ULTRASOUND COMPLETE COMPARISON:  Prior CT from 02/08/2017 FINDINGS: Gallbladder: Shadowing echogenic stones present within the gallbladder lumen, largest of which measures 8.5 mm. No abnormal gallbladder wall thickening. No free pericholecystic fluid. No sonographic Murphy sign elicited on exam. Common bile duct: Diameter: 4.8 mm Liver: No focal lesion identified. Within normal limits in parenchymal echogenicity. Portal vein is patent on color Doppler imaging with normal direction of blood flow towards the liver. IVC: No abnormality visualized. Pancreas: Pancreatic duct mildly prominent measuring approximately 3 mm, within normal limits. Spleen: Size and appearance within normal limits. Right Kidney: Length: 12.1 cm. Echogenicity within normal limits. No mass or hydronephrosis visualized. Left Kidney: Length: 12.4 cm. Echogenicity within normal limits. No mass or hydronephrosis visualized. Abdominal aorta: No aneurysm visualized. Other findings: None. IMPRESSION: 1. Cholelithiasis. No sonographic features to suggest acute cholecystitis. No biliary dilatation. 2. Otherwise unremarkable abdominal ultrasound. Electronically Signed   By: Jeannine Boga M.D.   On: 06/20/2017 00:16    Procedures Procedures (including critical care time)  Medications Ordered in ED Medications  sodium chloride 0.9 % bolus 1,000 mL (0 mLs Intravenous Stopped 06/20/17 0011)    HYDROmorphone (DILAUDID) injection 0.5 mg (0.5 mg Intravenous Given 06/19/17 2314)  ondansetron (ZOFRAN) injection 4 mg (4 mg Intravenous Given 06/19/17 2313)  dicyclomine (BENTYL) injection 20 mg (20 mg Intramuscular Given 06/19/17 2314)  HYDROmorphone (DILAUDID) injection 0.5 mg (0.5 mg Intravenous Given 06/20/17 0240)  oxyCODONE-acetaminophen (PERCOCET/ROXICET) 5-325 MG per tablet 1 tablet (1 tablet Oral Given 06/20/17 0240)     Initial Impression / Assessment and Plan / ED Course  I have reviewed the triage vital signs and the nursing notes.  Pertinent labs & imaging results that were available during my care of the patient were reviewed by me and considered in my medical decision making (see chart for details).     32 year old female presents to the emergency department for complaints of right upper quadrant abdominal pain with associated nausea and vomiting.  She also reports diarrhea with the consistency of "oil".  Patient notes her symptoms to be consistent with past episodes of biliary colic.  She has had similar pain for "years", but neglected to follow through with surgery stating that she was "scared".  She states that her pain was continually managed by her primary care doctor, Dr. Alyson Ingles, with 7.5 mg hydrocodone.  This provider is no longer in practice.  Patient noted to have right upper quadrant tenderness on exam with negative Murphy sign.  She has no fevers and vital signs have been stable, reassuring.  Despite complaints of vomiting prior to arrival, the patient has not exhibited any emesis since ED arrival.  Laboratory work-up reviewed which shows no evidence of leukocytosis or electrolyte derangements.  Liver and kidney function preserved.  Ultrasound today does confirm presence of gallstones, but shows no evidence of gallbladder wall thickening or pericholecystic fluid.  Patient's pain has been managed in the emergency department with 1 mg Dilaudid as well as IM Bentyl.  She  has been able to rest comfortably and tolerate POs without acute worsening.  Given chronicity of her pain, I do not believe further emergent work-up or imaging is indicated.  No indication for admission.  The patient has been encouraged to follow-up with general surgery to discuss elective cholecystectomy.  Patient requesting narcotics at time of discharge.  I have explained to the patient that we are unable to prescribe narcotics for management of chronic  pain.  She has been given a prescription for Bentyl as well as Phenergan.  Return precautions discussed and provided. Patient discharged in stable condition with no unaddressed concerns.  She expresses thanks for care received.   Final Clinical Impressions(s) / ED Diagnoses   Final diagnoses:  Biliary colic    ED Discharge Orders        Ordered    promethazine (PHENERGAN) 25 MG tablet  Every 6 hours PRN     06/20/17 0234    dicyclomine (BENTYL) 20 MG tablet  Every 12 hours PRN     06/20/17 0234       Antonietta Breach, PA-C 06/20/17 3300    Tegeler, Gwenyth Allegra, MD 06/20/17 1047

## 2017-06-19 NOTE — ED Triage Notes (Signed)
Pt complaint of right abdominal pain with n/v/d; sent from PCP for concern of gallbladder issue.

## 2017-06-20 LAB — URINALYSIS, ROUTINE W REFLEX MICROSCOPIC
Bilirubin Urine: NEGATIVE
Glucose, UA: NEGATIVE mg/dL
Ketones, ur: NEGATIVE mg/dL
Nitrite: NEGATIVE
PROTEIN: NEGATIVE mg/dL
SPECIFIC GRAVITY, URINE: 1.027 (ref 1.005–1.030)
pH: 5 (ref 5.0–8.0)

## 2017-06-20 MED ORDER — OXYCODONE-ACETAMINOPHEN 5-325 MG PO TABS
1.0000 | ORAL_TABLET | Freq: Once | ORAL | Status: AC
  Administered 2017-06-20: 1 via ORAL
  Filled 2017-06-20: qty 1

## 2017-06-20 MED ORDER — HYDROMORPHONE HCL 1 MG/ML IJ SOLN
0.5000 mg | Freq: Once | INTRAMUSCULAR | Status: AC
  Administered 2017-06-20: 0.5 mg via INTRAVENOUS
  Filled 2017-06-20: qty 1

## 2017-06-20 MED ORDER — PROMETHAZINE HCL 25 MG PO TABS: 25.0000 mg | ORAL_TABLET | Freq: Four times a day (QID) | ORAL | 0 refills | Status: DC | PRN

## 2017-06-20 MED ORDER — DICYCLOMINE HCL 20 MG PO TABS: 20.0000 mg | ORAL_TABLET | Freq: Two times a day (BID) | ORAL | 0 refills | Status: DC | PRN

## 2017-06-20 NOTE — Discharge Instructions (Signed)
Avoid fried foods, fatty foods, greasy foods until symptoms resolve. Be sure your child drinks plenty of clear liquids. We recommend the use of Phenergan as prescribed for nausea/vomiting and Bentyl for persistent pain. Follow-up with your primary care doctor to ensure resolution of symptoms.  Follow up with general surgery to discuss surgery for gallbladder removal.

## 2017-06-28 ENCOUNTER — Ambulatory Visit: Payer: Self-pay | Admitting: Surgery

## 2017-06-28 NOTE — H&P (View-Only) (Signed)
  [name] Documented: 06/28/2017 2:56 PM Location: [facility] Patient #: [id number] DOB: [birth year] Single / Language: [name] / Race: Black or African American Female  History of Present Illness (Donna Julius A. Delayza Lungren MD; 06/28/2017 3:25 PM) Patient words: 32 year old woman with history of epilepsy, brain tumor, schizoaffective disorder, anxiety, depression, ovarian cancer in childhood, kidney stones who is referred by the ER for biliary colic. She presented there about a week ago with intermittent pain over the last few years which had acutely worsened in the previous few days. She mentions to the epigastrium and right subcostal margin. Her symptoms are associated with nausea and anorexia as well as vomiting. She also complains of intermittent watery diarrhea which she compares to coil. She says that she has been evaluated for an ulcer in the past and does not have any trouble with that. No associated fever.  She has had an appendectomy as well as a unilateral salpingo-oophorectomy which is through a large low transverse incision. She also has scars at her umbilicus and on bilateral abdomen from what she says were drain placements.  No drug or tobacco alcohol use but she does smoke 2 packs of cigarettes a day.  The patient is a 32 year old female.   Allergies ([name], Brown; 06/28/2017 2:56 PM) Ketorolac Tromethamine *ANALGESICS - ANTI-INFLAMMATORY* Hives Lortab *ANALGESICS - OPIOID* Hives TraMADol HCl *ANALGESICS - OPIOID* Hives Latex Allergies Reconciled  Medication History ([name], Brown; 06/28/2017 2:57 PM) Oxycodone-Acetaminophen (7.5-325MG  Tablet, Oral as needed) Active. HydroCHLOROthiazide (25MG  Tablet, Oral daily) Active. Medications Reconciled     Review of Systems (Donna Barna A. Kae Heller MD; 06/28/2017 3:24 PM) All other systems negative  Vitals ([name]; 06/28/2017 2:57 PM) 06/28/2017 2:57 PM Weight: 262.5 lb Height:  65in Body Surface Area: 2.22 m Body Mass Index: 43.68 kg/m  Temp.: 98.36F(Oral)  Pulse: 102 (Regular)  BP: 150/90 (Sitting, Right Wrist, Standard)      Physical Exam (Donna Swoveland A. Kae Heller MD; 06/28/2017 3:25 PM)  The physical exam findings are as follows: Note:Gen: alert and cooperative, appears significantly older than her stated age of 60 Eye: extraocular motion intact, no scleral icterus ENT: moist mucus membranes, dentition intact Neck: no mass or thyromegaly Chest: unlabored respirations, symmetrical air entry, clear bilaterally CV: regular rate and rhythm, no pedal edema Abdomen: soft, nontender, nondistended. obese, multiple surgical scars MSK: strength symmetrical throughout, no deformity Neuro: grossly intact, normal gait Psych: normal mood and affect, appropriate insight Skin: warm and dry, no rash or lesion on limited exam    Assessment & Plan (Donna Bernard A. Capitola Ladson MD; 06/28/2017 3:97 PM)  BILIARY COLIC (Q73.41) Story: Ultrasound confirms gallstones but no cholecystitis, common bile duct 4.8 mm. her CMP and CBC in the emergency room were unremarkable. We discussed laparoscopic cholecystectomy including the surgical technique, risk of bleeding, infection, pain, scarring, intra-abdominal injury specifically to the common bile duct and sequelae, conversion to open surgery, blood clot, heart attack, pneumonia, stroke, death, failure to resolve symptoms. Questions were answered to her satisfaction. We'll proceed with surgery.

## 2017-06-28 NOTE — H&P (Signed)
  [name] Documented: 06/28/2017 2:56 PM Location: [facility] Patient #: [id number] DOB: Jul 29, 1985 Single / Language: [name] / Race: Black or African American Female  History of Present Illness (Donna Eads A. Advith Martine MD; 06/28/2017 3:25 PM) Patient words: 32 year old woman with history of epilepsy, brain tumor, schizoaffective disorder, anxiety, depression, ovarian cancer in childhood, kidney stones who is referred by the ER for biliary colic. She presented there about a week ago with intermittent pain over the last few years which had acutely worsened in the previous few days. She mentions to the epigastrium and right subcostal margin. Her symptoms are associated with nausea and anorexia as well as vomiting. She also complains of intermittent watery diarrhea which she compares to coil. She says that she has been evaluated for an ulcer in the past and does not have any trouble with that. No associated fever.  She has had an appendectomy as well as a unilateral salpingo-oophorectomy which is through a large low transverse incision. She also has scars at her umbilicus and on bilateral abdomen from what she says were drain placements.  No drug or tobacco alcohol use but she does smoke 2 packs of cigarettes a day.  The patient is a 32 year old female.   Allergies (Danielle Gerrigner, CMA; 06/28/2017 2:56 PM) Ketorolac Tromethamine *ANALGESICS - ANTI-INFLAMMATORY* Hives Lortab *ANALGESICS - OPIOID* Hives TraMADol HCl *ANALGESICS - OPIOID* Hives Latex Allergies Reconciled  Medication History (Danielle Gerrigner, CMA; 06/28/2017 2:57 PM) Oxycodone-Acetaminophen (7.5-325MG  Tablet, Oral as needed) Active. HydroCHLOROthiazide (25MG  Tablet, Oral daily) Active. Medications Reconciled     Review of Systems (Oneida Mckamey A. Kae Heller MD; 06/28/2017 3:24 PM) All other systems negative  Vitals (Danielle Gerrigner CMA; 06/28/2017 2:57 PM) 06/28/2017 2:57 PM Weight: 262.5 lb Height:  65in Body Surface Area: 2.22 m Body Mass Index: 43.68 kg/m  Temp.: 98.38F(Oral)  Pulse: 102 (Regular)  BP: 150/90 (Sitting, Right Wrist, Standard)      Physical Exam (Sharrod Achille A. Kae Heller MD; 06/28/2017 3:25 PM)  The physical exam findings are as follows: Note:Gen: alert and cooperative, appears significantly older than her stated age of 1 Eye: extraocular motion intact, no scleral icterus ENT: moist mucus membranes, dentition intact Neck: no mass or thyromegaly Chest: unlabored respirations, symmetrical air entry, clear bilaterally CV: regular rate and rhythm, no pedal edema Abdomen: soft, nontender, nondistended. obese, multiple surgical scars MSK: strength symmetrical throughout, no deformity Neuro: grossly intact, normal gait Psych: normal mood and affect, appropriate insight Skin: warm and dry, no rash or lesion on limited exam    Assessment & Plan (Josuha Fontanez A. Tahirih Lair MD; 06/28/2017 9:37 PM)  BILIARY COLIC (J69.67) Story: Ultrasound confirms gallstones but no cholecystitis, common bile duct 4.8 mm. her CMP and CBC in the emergency room were unremarkable. We discussed laparoscopic cholecystectomy including the surgical technique, risk of bleeding, infection, pain, scarring, intra-abdominal injury specifically to the common bile duct and sequelae, conversion to open surgery, blood clot, heart attack, pneumonia, stroke, death, failure to resolve symptoms. Questions were answered to her satisfaction. We'll proceed with surgery.

## 2017-07-04 ENCOUNTER — Other Ambulatory Visit: Payer: Self-pay

## 2017-07-04 ENCOUNTER — Encounter (HOSPITAL_COMMUNITY): Payer: Self-pay

## 2017-07-04 ENCOUNTER — Ambulatory Visit (HOSPITAL_COMMUNITY)
Admission: RE | Admit: 2017-07-04 | Discharge: 2017-07-04 | Disposition: A | Discharge status: Home / Self Care | Payer: Medicaid Other | Source: Ambulatory Visit | Attending: Surgery | Admitting: Surgery

## 2017-07-04 ENCOUNTER — Encounter (HOSPITAL_COMMUNITY)
Admission: RE | Admit: 2017-07-04 | Discharge: 2017-07-04 | Disposition: A | Discharge status: Home / Self Care | Payer: Medicaid Other | Source: Ambulatory Visit | Attending: Surgery | Admitting: Surgery

## 2017-07-04 DIAGNOSIS — Z01812 Encounter for preprocedural laboratory examination: Secondary | ICD-10-CM | POA: Insufficient documentation

## 2017-07-04 DIAGNOSIS — Z01818 Encounter for other preprocedural examination: Secondary | ICD-10-CM | POA: Diagnosis not present

## 2017-07-04 DIAGNOSIS — F209 Schizophrenia, unspecified: Secondary | ICD-10-CM | POA: Diagnosis not present

## 2017-07-04 DIAGNOSIS — I10 Essential (primary) hypertension: Secondary | ICD-10-CM | POA: Diagnosis not present

## 2017-07-04 DIAGNOSIS — Z01810 Encounter for preprocedural cardiovascular examination: Secondary | ICD-10-CM | POA: Diagnosis present

## 2017-07-04 DIAGNOSIS — G4730 Sleep apnea, unspecified: Secondary | ICD-10-CM | POA: Diagnosis not present

## 2017-07-04 DIAGNOSIS — F329 Major depressive disorder, single episode, unspecified: Secondary | ICD-10-CM | POA: Insufficient documentation

## 2017-07-04 DIAGNOSIS — F17200 Nicotine dependence, unspecified, uncomplicated: Secondary | ICD-10-CM | POA: Insufficient documentation

## 2017-07-04 DIAGNOSIS — J45909 Unspecified asthma, uncomplicated: Secondary | ICD-10-CM | POA: Diagnosis not present

## 2017-07-04 DIAGNOSIS — F419 Anxiety disorder, unspecified: Secondary | ICD-10-CM | POA: Diagnosis not present

## 2017-07-04 HISTORY — DX: Unspecified asthma, uncomplicated: J45.909

## 2017-07-04 LAB — CBC WITH DIFFERENTIAL/PLATELET
BASOS ABS: 0 10*3/uL (ref 0.0–0.1)
Basophils Relative: 0 %
EOS ABS: 0 10*3/uL (ref 0.0–0.7)
EOS PCT: 1 %
HEMATOCRIT: 35.2 % — AB (ref 36.0–46.0)
HEMOGLOBIN: 11.3 g/dL — AB (ref 12.0–15.0)
LYMPHS ABS: 3.4 10*3/uL (ref 0.7–4.0)
Lymphocytes Relative: 40 %
MCH: 30.8 pg (ref 26.0–34.0)
MCHC: 32.1 g/dL (ref 30.0–36.0)
MCV: 95.9 fL (ref 78.0–100.0)
Monocytes Absolute: 0.4 10*3/uL (ref 0.1–1.0)
Monocytes Relative: 4 %
NEUTROS PCT: 55 %
Neutro Abs: 4.7 10*3/uL (ref 1.7–7.7)
Platelets: 354 10*3/uL (ref 150–400)
RBC: 3.67 MIL/uL — AB (ref 3.87–5.11)
RDW: 13 % (ref 11.5–15.5)
WBC: 8.5 10*3/uL (ref 4.0–10.5)

## 2017-07-04 LAB — COMPREHENSIVE METABOLIC PANEL
ALK PHOS: 81 U/L (ref 38–126)
ALT: 17 U/L (ref 14–54)
AST: 20 U/L (ref 15–41)
Albumin: 4.1 g/dL (ref 3.5–5.0)
Anion gap: 9 (ref 5–15)
BILIRUBIN TOTAL: 0.6 mg/dL (ref 0.3–1.2)
BUN: 17 mg/dL (ref 6–20)
CO2: 25 mmol/L (ref 22–32)
CREATININE: 0.95 mg/dL (ref 0.44–1.00)
Calcium: 9.3 mg/dL (ref 8.9–10.3)
Chloride: 108 mmol/L (ref 101–111)
GFR calc Af Amer: >60 mL/min (ref >60)
Glucose, Bld: 88 mg/dL (ref 65–99)
Potassium: 3.5 mmol/L (ref 3.5–5.1)
Sodium: 142 mmol/L (ref 135–145)
TOTAL PROTEIN: 7.9 g/dL (ref 6.5–8.1)

## 2017-07-04 NOTE — Patient Instructions (Addendum)
Zyliah L Fye  07/04/2017   Your procedure is scheduled on: Tuesday 07/09/2017  Report to [facility] Main  Entrance              Report to admitting at  0800 AM    Call this number if you have problems the morning of surgery [phone]    Remember: Do not eat food or drink liquids :After Midnight.     Take these medicines the morning of surgery with A SIP OF WATER: Sertraline (Zoloft)                                You may not have any metal on your body including hair pins and              piercings  Do not wear jewelry, make-up, lotions, powders or perfumes, deodorant             Do not wear nail polish.  Do not shave  48 hours prior to surgery.              Do not bring valuables to the hospital. [place].  Contacts, dentures or bridgework may not be worn into surgery.  Leave suitcase in the car. After surgery it may be brought to your room.                  Please read over the following fact sheets you were given: _____________________________________________________________________             [facility] - Preparing for Surgery Before surgery, you can play an important role.  Because skin is not sterile, your skin needs to be as free of germs as possible.  You can reduce the number of germs on your skin by washing with CHG (chlorahexidine gluconate) soap before surgery.  CHG is an antiseptic cleaner which kills germs and bonds with the skin to continue killing germs even after washing. Please DO NOT use if you have an allergy to CHG or antibacterial soaps.  If your skin becomes reddened/irritated stop using the CHG and inform your nurse when you arrive at Short Stay. Do not shave (including legs and underarms) for at least 48 hours prior to the first CHG shower.  You may shave your face/neck. Please follow these instructions carefully:  1.  Shower with CHG Soap the night before surgery and  the  morning of Surgery.  2.  If you choose to wash your hair, wash your hair first as usual with your  normal  shampoo.  3.  After you shampoo, rinse your hair and body thoroughly to remove the  shampoo.                           4.  Use CHG as you would any other liquid soap.  You can apply chg directly  to the skin and wash                       Gently with a scrungie or clean washcloth.  5.  Apply the CHG Soap to your body ONLY FROM THE NECK DOWN.   Do not use on face/ open  Wound or open sores. Avoid contact with eyes, ears mouth and genitals (private parts).                       Wash face,  Genitals (private parts) with your normal soap.             6.  Wash thoroughly, paying special attention to the area where your surgery  will be performed.  7.  Thoroughly rinse your body with warm water from the neck down.  8.  DO NOT shower/wash with your normal soap after using and rinsing off  the CHG Soap.                9.  Pat yourself dry with a clean towel.            10.  Wear clean pajamas.            11.  Place clean sheets on your bed the night of your first shower and do not  sleep with pets. Day of Surgery : Do not apply any lotions/deodorants the morning of surgery.  Please wear clean clothes to the hospital/surgery center.  FAILURE TO FOLLOW THESE INSTRUCTIONS MAY RESULT IN THE CANCELLATION OF YOUR SURGERY PATIENT SIGNATURE_________________________________  NURSE SIGNATURE__________________________________  ________________________________________________________________________

## 2017-07-08 MED ORDER — DEXTROSE 5 % IV SOLN
3.0000 g | INTRAVENOUS | Status: AC
  Administered 2017-07-09: 3 g via INTRAVENOUS
  Filled 2017-07-08: qty 3

## 2017-07-09 ENCOUNTER — Ambulatory Visit (HOSPITAL_COMMUNITY)
Admission: RE | Admit: 2017-07-09 | Discharge: 2017-07-10 | Disposition: A | Discharge status: Home / Self Care | Payer: Medicaid Other | Source: Ambulatory Visit | Attending: Surgery | Admitting: Surgery

## 2017-07-09 ENCOUNTER — Ambulatory Visit (HOSPITAL_COMMUNITY): Payer: Medicaid Other | Admitting: Certified Registered Nurse Anesthetist

## 2017-07-09 ENCOUNTER — Encounter (HOSPITAL_COMMUNITY): Payer: Self-pay | Admitting: *Deleted

## 2017-07-09 ENCOUNTER — Encounter (HOSPITAL_COMMUNITY)
Admission: RE | Disposition: A | Discharge status: Home / Self Care | Payer: Self-pay | Source: Ambulatory Visit | Attending: Surgery

## 2017-07-09 ENCOUNTER — Other Ambulatory Visit: Payer: Self-pay

## 2017-07-09 DIAGNOSIS — K8010 Calculus of gallbladder with chronic cholecystitis without obstruction: Secondary | ICD-10-CM | POA: Diagnosis not present

## 2017-07-09 DIAGNOSIS — Z79899 Other long term (current) drug therapy: Secondary | ICD-10-CM | POA: Insufficient documentation

## 2017-07-09 DIAGNOSIS — G4730 Sleep apnea, unspecified: Secondary | ICD-10-CM | POA: Diagnosis not present

## 2017-07-09 DIAGNOSIS — F17200 Nicotine dependence, unspecified, uncomplicated: Secondary | ICD-10-CM | POA: Insufficient documentation

## 2017-07-09 DIAGNOSIS — K8050 Calculus of bile duct without cholangitis or cholecystitis without obstruction: Secondary | ICD-10-CM | POA: Diagnosis present

## 2017-07-09 DIAGNOSIS — I10 Essential (primary) hypertension: Secondary | ICD-10-CM | POA: Insufficient documentation

## 2017-07-09 DIAGNOSIS — F259 Schizoaffective disorder, unspecified: Secondary | ICD-10-CM | POA: Diagnosis not present

## 2017-07-09 DIAGNOSIS — Z6841 Body Mass Index (BMI) 40.0 and over, adult: Secondary | ICD-10-CM | POA: Diagnosis not present

## 2017-07-09 HISTORY — PX: CHOLECYSTECTOMY: SHX55

## 2017-07-09 SURGERY — LAPAROSCOPIC CHOLECYSTECTOMY
Anesthesia: General | Site: Abdomen

## 2017-07-09 MED ORDER — ACETAMINOPHEN 500 MG PO TABS
1000.0000 mg | ORAL_TABLET | ORAL | Status: AC
  Administered 2017-07-09: 1000 mg via ORAL
  Filled 2017-07-09: qty 2

## 2017-07-09 MED ORDER — DIPHENHYDRAMINE HCL 25 MG PO CAPS
25.0000 mg | ORAL_CAPSULE | Freq: Four times a day (QID) | ORAL | Status: DC | PRN
  Administered 2017-07-10: 25 mg via ORAL
  Filled 2017-07-09: qty 1

## 2017-07-09 MED ORDER — PROMETHAZINE HCL 25 MG/ML IJ SOLN
INTRAMUSCULAR | Status: AC
  Filled 2017-07-09: qty 1

## 2017-07-09 MED ORDER — PROMETHAZINE HCL 25 MG/ML IJ SOLN
6.2500 mg | INTRAMUSCULAR | Status: DC | PRN
Start: 2017-07-09 — End: 2017-07-09
  Administered 2017-07-09: 6.25 mg via INTRAVENOUS

## 2017-07-09 MED ORDER — DIPHENHYDRAMINE HCL 50 MG/ML IJ SOLN: 25.0000 mg | Freq: Four times a day (QID) | INTRAMUSCULAR | Status: DC | PRN

## 2017-07-09 MED ORDER — SCOPOLAMINE 1 MG/3DAYS TD PT72
MEDICATED_PATCH | TRANSDERMAL | Status: AC
  Administered 2017-07-09: 1.5 mg via TRANSDERMAL
  Filled 2017-07-09: qty 1

## 2017-07-09 MED ORDER — PROPOFOL 10 MG/ML IV BOLUS
INTRAVENOUS | Status: AC
  Filled 2017-07-09: qty 20

## 2017-07-09 MED ORDER — HYDROCHLOROTHIAZIDE 25 MG PO TABS
12.5000 mg | ORAL_TABLET | Freq: Every day | ORAL | Status: DC
  Administered 2017-07-09: 12.5 mg via ORAL
  Filled 2017-07-09: qty 1

## 2017-07-09 MED ORDER — SUGAMMADEX SODIUM 200 MG/2ML IV SOLN
INTRAVENOUS | Status: DC | PRN
  Administered 2017-07-09: 300 mg via INTRAVENOUS

## 2017-07-09 MED ORDER — OXYCODONE-ACETAMINOPHEN 5-325 MG PO TABS: 1.0000 | ORAL_TABLET | Freq: Four times a day (QID) | ORAL | 0 refills | Status: DC | PRN

## 2017-07-09 MED ORDER — DOCUSATE SODIUM 100 MG PO CAPS: 100.0000 mg | ORAL_CAPSULE | Freq: Two times a day (BID) | ORAL | 0 refills | Status: AC

## 2017-07-09 MED ORDER — QUETIAPINE FUMARATE 100 MG PO TABS
100.0000 mg | ORAL_TABLET | Freq: Every day | ORAL | Status: DC
  Filled 2017-07-09: qty 1

## 2017-07-09 MED ORDER — FENTANYL CITRATE (PF) 100 MCG/2ML IJ SOLN
25.0000 ug | INTRAMUSCULAR | Status: DC | PRN
  Administered 2017-07-09 (×2): 25 ug via INTRAVENOUS
  Administered 2017-07-09: 50 ug via INTRAVENOUS

## 2017-07-09 MED ORDER — ONDANSETRON HCL 4 MG/2ML IJ SOLN
INTRAMUSCULAR | Status: AC
  Filled 2017-07-09: qty 2

## 2017-07-09 MED ORDER — ORAL CARE MOUTH RINSE
15.0000 mL | Freq: Two times a day (BID) | OROMUCOSAL | Status: DC
  Administered 2017-07-09: 15 mL via OROMUCOSAL

## 2017-07-09 MED ORDER — SODIUM CHLORIDE 0.9% FLUSH: 3.0000 mL | Freq: Two times a day (BID) | INTRAVENOUS | Status: DC

## 2017-07-09 MED ORDER — METOPROLOL TARTRATE 5 MG/5ML IV SOLN: 5.0000 mg | Freq: Four times a day (QID) | INTRAVENOUS | Status: DC | PRN

## 2017-07-09 MED ORDER — GABAPENTIN 300 MG PO CAPS
300.0000 mg | ORAL_CAPSULE | ORAL | Status: AC
  Administered 2017-07-09: 300 mg via ORAL
  Filled 2017-07-09: qty 1

## 2017-07-09 MED ORDER — ACETAMINOPHEN 650 MG RE SUPP: 650.0000 mg | RECTAL | Status: DC | PRN

## 2017-07-09 MED ORDER — LIDOCAINE 2% (20 MG/ML) 5 ML SYRINGE
INTRAMUSCULAR | Status: AC
  Filled 2017-07-09: qty 5

## 2017-07-09 MED ORDER — SODIUM CHLORIDE 0.9 % IV SOLN: 250.0000 mL | INTRAVENOUS | Status: DC | PRN

## 2017-07-09 MED ORDER — FENTANYL CITRATE (PF) 100 MCG/2ML IJ SOLN
INTRAMUSCULAR | Status: AC
  Filled 2017-07-09: qty 2

## 2017-07-09 MED ORDER — CHLORHEXIDINE GLUCONATE 4 % EX LIQD: 60.0000 mL | Freq: Once | CUTANEOUS | Status: DC

## 2017-07-09 MED ORDER — MIDAZOLAM HCL 5 MG/5ML IJ SOLN
INTRAMUSCULAR | Status: DC | PRN
  Administered 2017-07-09: 2 mg via INTRAVENOUS

## 2017-07-09 MED ORDER — PROPOFOL 10 MG/ML IV BOLUS
INTRAVENOUS | Status: DC | PRN
  Administered 2017-07-09: 200 mg via INTRAVENOUS

## 2017-07-09 MED ORDER — DIPHENHYDRAMINE HCL 50 MG/ML IJ SOLN
INTRAMUSCULAR | Status: DC | PRN
  Administered 2017-07-09: 12.5 mg via INTRAVENOUS

## 2017-07-09 MED ORDER — DOCUSATE SODIUM 100 MG PO CAPS
100.0000 mg | ORAL_CAPSULE | Freq: Two times a day (BID) | ORAL | Status: DC
  Administered 2017-07-09: 100 mg via ORAL
  Filled 2017-07-09: qty 1

## 2017-07-09 MED ORDER — LIDOCAINE 2% (20 MG/ML) 5 ML SYRINGE
INTRAMUSCULAR | Status: DC | PRN
  Administered 2017-07-09: 80 mg via INTRAVENOUS

## 2017-07-09 MED ORDER — 0.9 % SODIUM CHLORIDE (POUR BTL) OPTIME
TOPICAL | Status: DC | PRN
  Administered 2017-07-09: 1000 mL

## 2017-07-09 MED ORDER — FENTANYL CITRATE (PF) 100 MCG/2ML IJ SOLN: 25.0000 ug | INTRAMUSCULAR | Status: DC | PRN

## 2017-07-09 MED ORDER — IBUPROFEN 200 MG PO TABS
600.0000 mg | ORAL_TABLET | Freq: Four times a day (QID) | ORAL | Status: DC
  Administered 2017-07-09 – 2017-07-10 (×3): 600 mg via ORAL
  Filled 2017-07-09 (×3): qty 3

## 2017-07-09 MED ORDER — ACETAMINOPHEN 500 MG PO TABS
1000.0000 mg | ORAL_TABLET | Freq: Four times a day (QID) | ORAL | Status: DC
  Administered 2017-07-09 – 2017-07-10 (×3): 1000 mg via ORAL
  Filled 2017-07-09 (×3): qty 2

## 2017-07-09 MED ORDER — SUCCINYLCHOLINE CHLORIDE 200 MG/10ML IV SOSY
PREFILLED_SYRINGE | INTRAVENOUS | Status: DC | PRN
  Administered 2017-07-09: 120 mg via INTRAVENOUS

## 2017-07-09 MED ORDER — ONDANSETRON HCL 4 MG/2ML IJ SOLN
INTRAMUSCULAR | Status: DC | PRN
  Administered 2017-07-09: 4 mg via INTRAVENOUS

## 2017-07-09 MED ORDER — ACETAMINOPHEN 325 MG PO TABS: 650.0000 mg | ORAL_TABLET | ORAL | Status: DC | PRN

## 2017-07-09 MED ORDER — OXYCODONE HCL 5 MG PO TABS
5.0000 mg | ORAL_TABLET | ORAL | Status: DC | PRN
  Administered 2017-07-09: 10 mg via ORAL
  Filled 2017-07-09: qty 2

## 2017-07-09 MED ORDER — SUCCINYLCHOLINE CHLORIDE 200 MG/10ML IV SOSY
PREFILLED_SYRINGE | INTRAVENOUS | Status: AC
  Filled 2017-07-09: qty 10

## 2017-07-09 MED ORDER — SENNA 8.6 MG PO TABS
1.0000 | ORAL_TABLET | Freq: Two times a day (BID) | ORAL | Status: DC
  Administered 2017-07-09: 8.6 mg via ORAL
  Filled 2017-07-09: qty 1

## 2017-07-09 MED ORDER — ONDANSETRON 4 MG PO TBDP: 4.0000 mg | ORAL_TABLET | Freq: Four times a day (QID) | ORAL | Status: DC | PRN

## 2017-07-09 MED ORDER — OXYCODONE HCL 5 MG PO TABS: 5.0000 mg | ORAL_TABLET | ORAL | Status: DC | PRN

## 2017-07-09 MED ORDER — DEXAMETHASONE SODIUM PHOSPHATE 10 MG/ML IJ SOLN
INTRAMUSCULAR | Status: DC | PRN
  Administered 2017-07-09: 5 mg via INTRAVENOUS

## 2017-07-09 MED ORDER — LACTATED RINGERS IR SOLN
Status: DC | PRN
  Administered 2017-07-09: 1000 mL

## 2017-07-09 MED ORDER — ONDANSETRON HCL 4 MG/2ML IJ SOLN: 4.0000 mg | Freq: Four times a day (QID) | INTRAMUSCULAR | Status: DC | PRN

## 2017-07-09 MED ORDER — HYDRALAZINE HCL 20 MG/ML IJ SOLN: 10.0000 mg | INTRAMUSCULAR | Status: DC | PRN

## 2017-07-09 MED ORDER — SCOPOLAMINE 1 MG/3DAYS TD PT72
1.0000 | MEDICATED_PATCH | TRANSDERMAL | Status: DC
  Administered 2017-07-09: 1.5 mg via TRANSDERMAL

## 2017-07-09 MED ORDER — SERTRALINE HCL 25 MG PO TABS: 25.0000 mg | ORAL_TABLET | Freq: Every day | ORAL | Status: DC

## 2017-07-09 MED ORDER — SUGAMMADEX SODIUM 500 MG/5ML IV SOLN
INTRAVENOUS | Status: AC
  Filled 2017-07-09: qty 5

## 2017-07-09 MED ORDER — CELECOXIB 200 MG PO CAPS
200.0000 mg | ORAL_CAPSULE | ORAL | Status: AC
  Administered 2017-07-09: 200 mg via ORAL
  Filled 2017-07-09: qty 1

## 2017-07-09 MED ORDER — LACTATED RINGERS IV SOLN
INTRAVENOUS | Status: DC
Start: 2017-07-09 — End: 2017-07-09
  Administered 2017-07-09 (×2): via INTRAVENOUS

## 2017-07-09 MED ORDER — LACTATED RINGERS IV SOLN
INTRAVENOUS | Status: DC
  Administered 2017-07-09: 15:00:00 via INTRAVENOUS
  Administered 2017-07-10: 1000 mL via INTRAVENOUS

## 2017-07-09 MED ORDER — LIDOCAINE 2% (20 MG/ML) 5 ML SYRINGE
INTRAMUSCULAR | Status: DC | PRN
  Administered 2017-07-09: 1.5 mg/kg/h via INTRAVENOUS

## 2017-07-09 MED ORDER — BUPIVACAINE HCL (PF) 0.25 % IJ SOLN
INTRAMUSCULAR | Status: AC
Start: 2017-07-09 — End: ?
  Filled 2017-07-09: qty 30

## 2017-07-09 MED ORDER — HYDROMORPHONE HCL 1 MG/ML IJ SOLN
0.5000 mg | INTRAMUSCULAR | Status: DC | PRN
  Administered 2017-07-09 – 2017-07-10 (×5): 0.5 mg via INTRAVENOUS
  Filled 2017-07-09 (×5): qty 0.5

## 2017-07-09 MED ORDER — SODIUM CHLORIDE 0.9% FLUSH: 3.0000 mL | INTRAVENOUS | Status: DC | PRN

## 2017-07-09 MED ORDER — METHOCARBAMOL 500 MG PO TABS
500.0000 mg | ORAL_TABLET | Freq: Four times a day (QID) | ORAL | Status: DC | PRN
  Administered 2017-07-09: 500 mg via ORAL
  Filled 2017-07-09: qty 1

## 2017-07-09 MED ORDER — FENTANYL CITRATE (PF) 250 MCG/5ML IJ SOLN
INTRAMUSCULAR | Status: DC | PRN
  Administered 2017-07-09: 50 ug via INTRAVENOUS
  Administered 2017-07-09: 100 ug via INTRAVENOUS
  Administered 2017-07-09: 50 ug via INTRAVENOUS

## 2017-07-09 MED ORDER — MIDAZOLAM HCL 2 MG/2ML IJ SOLN
INTRAMUSCULAR | Status: AC
  Filled 2017-07-09: qty 2

## 2017-07-09 MED ORDER — ENOXAPARIN SODIUM 40 MG/0.4ML ~~LOC~~ SOLN
40.0000 mg | SUBCUTANEOUS | Status: DC
  Administered 2017-07-09: 40 mg via SUBCUTANEOUS
  Filled 2017-07-09: qty 0.4

## 2017-07-09 MED ORDER — FENTANYL CITRATE (PF) 250 MCG/5ML IJ SOLN
INTRAMUSCULAR | Status: AC
  Filled 2017-07-09: qty 5

## 2017-07-09 MED ORDER — SCOPOLAMINE 1 MG/3DAYS TD PT72SCOPOLAMINE 1 MG/3DAYS
1.0000 | MEDICATED_PATCH | TRANSDERMAL | Status: DC
Start: 2017-07-09 — End: 2017-07-09

## 2017-07-09 MED ORDER — DEXAMETHASONE SODIUM PHOSPHATE 10 MG/ML IJ SOLN
INTRAMUSCULAR | Status: AC
  Filled 2017-07-09: qty 1

## 2017-07-09 MED ORDER — ROCURONIUM BROMIDE 10 MG/ML (PF) SYRINGE
PREFILLED_SYRINGE | INTRAVENOUS | Status: AC
  Filled 2017-07-09: qty 5

## 2017-07-09 MED ORDER — PNEUMOCOCCAL VAC POLYVALENT 25 MCG/0.5ML IJ INJ
0.5000 mL | INJECTION | INTRAMUSCULAR | Status: DC
  Filled 2017-07-09: qty 0.5

## 2017-07-09 MED ORDER — ROCURONIUM BROMIDE 50 MG/5ML IV SOSY
PREFILLED_SYRINGE | INTRAVENOUS | Status: DC | PRN
  Administered 2017-07-09: 50 mg via INTRAVENOUS

## 2017-07-09 MED ORDER — BUPIVACAINE HCL (PF) 0.25 % IJ SOLN
INTRAMUSCULAR | Status: DC | PRN
  Administered 2017-07-09: 30 mL

## 2017-07-09 SURGICAL SUPPLY — 32 items
ADH SKN CLS APL DERMABOND .7 (GAUZE/BANDAGES/DRESSINGS) ×1
APPLIER CLIP ROT 10 11.4 M/L (STAPLE) ×3
CABLE HIGH FREQUENCY MONO STRZ (ELECTRODE) ×3 IMPLANT
CHLORAPREP W/TINT 26ML (MISCELLANEOUS) ×3 IMPLANT
CLIP APPLIE ROT 10 11.4 M/L (STAPLE) ×1 IMPLANT
COVER MAYO STAND STRL (DRAPES) IMPLANT
COVER SURGICAL LIGHT HANDLE (MISCELLANEOUS) ×3 IMPLANT
DECANTER SPIKE VIAL GLASS SM (MISCELLANEOUS) ×3 IMPLANT
DERMABOND ADVANCED (GAUZE/BANDAGES/DRESSINGS) ×2
DERMABOND ADVANCED .7 DNX12 (GAUZE/BANDAGES/DRESSINGS) ×1 IMPLANT
DRAPE C-ARM 42X120 X-RAY (DRAPES) IMPLANT
ELECT REM PT RETURN 15FT ADLT (MISCELLANEOUS) ×3 IMPLANT
GLOVE BIO SURGEON STRL SZ 6 (GLOVE) ×3 IMPLANT
GLOVE INDICATOR 6.5 STRL GRN (GLOVE) ×3 IMPLANT
GOWN STRL REUS W/TWL LRG LVL3 (GOWN DISPOSABLE) ×3 IMPLANT
GOWN STRL REUS W/TWL XL LVL3 (GOWN DISPOSABLE) ×6 IMPLANT
GRASPER SUT TROCAR 14GX15 (MISCELLANEOUS) ×3 IMPLANT
HEMOSTAT SNOW SURGICEL 2X4 (HEMOSTASIS) IMPLANT
KIT BASIN OR (CUSTOM PROCEDURE TRAY) ×3 IMPLANT
NEEDLE INSUFFLATION 14GA 120MM (NEEDLE) ×3 IMPLANT
POUCH SPECIMEN RETRIEVAL 10MM (ENDOMECHANICALS) ×3 IMPLANT
SCISSORS LAP 5X35 DISP (ENDOMECHANICALS) ×3 IMPLANT
SET CHOLANGIOGRAPH MIX (MISCELLANEOUS) IMPLANT
SET IRRIG TUBING LAPAROSCOPIC (IRRIGATION / IRRIGATOR) ×3 IMPLANT
SLEEVE XCEL OPT CAN 5 100 (ENDOMECHANICALS) ×9 IMPLANT
SUT MNCRL AB 4-0 PS2 18 (SUTURE) ×3 IMPLANT
TOWEL OR 17X26 10 PK STRL BLUE (TOWEL DISPOSABLE) ×3 IMPLANT
TOWEL OR NON WOVEN STRL DISP B (DISPOSABLE) IMPLANT
TRAY LAPAROSCOPIC (CUSTOM PROCEDURE TRAY) ×3 IMPLANT
TROCAR BLADELESS OPT 5 100 (ENDOMECHANICALS) ×3 IMPLANT
TROCAR XCEL 12X100 BLDLESS (ENDOMECHANICALS) ×3 IMPLANT
TUBING INSUF HEATED (TUBING) ×3 IMPLANT

## 2017-07-09 NOTE — Op Note (Signed)
Operative Note  [name] 32 y.o. female [id number]  07/09/2017  Surgeon: Clovis Riley MD  Assistant: Armandina Gemma MD  Procedure performed: Laparoscopic Cholecystectomy  Preop diagnosis: biliary colic Post-op diagnosis/intraop findings: same  Specimens: gallbladder  EBL: minimal  Complications: none  Description of procedure: After obtaining informed consent the patient was brought to the operating room. Prophylactic antibiotics and subcutaneous heparin were administered. SCD's were applied. General endotracheal anesthesia was initiated and a formal time-out was performed. The abdomen was prepped and draped in the usual sterile fashion and the abdomen was entered using visiport technique in the left upper quadrant after instilling the site with local. Insufflation to 66mmHg was obtained and gross inspection revealed no evidence of injury from our entry. There is adhesive disease of omentum to the upper mid and left abdominal wall as well as bowel adherent to the lower abdominal wall. This was left undisturbed. Three 34mm trocars were introduced in the supraumbilical, right midclavicular and right anterior axillary lines under direct visualization and following infiltration with local. An 6mm trocar was placed in the epigastrium. The gallbladder was retracted cephalad and the infundibulum was retracted laterally. Adhesions between the liver and omentum/ hepatic flexure mesentery laterally were taken down with cautery and sharp dissection to prevent tearing with retraction of the liver. A combination of hook electrocautery and blunt dissection was utilized to clear the peritoneum from the gallbladder neck and cystic duct, circumferentially isolating the cystic artery and cystic duct and lifting the gallbladder from the cystic plate. The critical view of safety was achieved with the cystic artery, cystic duct, and liver bed visualized between them with no other structures. The artery was  clipped with a two clips proximally and distally and divided as was the cystic duct with three clips on the proximal end. The gallbladder was dissected from the liver plate using electrocautery. Once freed the gallbladder was placed in an endocatch bag and removed through the epigastric trocar site. A small amount of bleeding on the liver bed was controlled with cautery. Some bile had been spilled from the gallbladder during its dissection from the liver bed. This was aspirated and the right upper quadrant was irrigated copiously until the effluent was clear. Hemostasis was once again confirmed, and reinspection of the abdomen revealed no injuries. The clips were well opposed without any bile leak from the duct or the liver bed. The 32mm trocar site in the epigastrium was closed with a 0 vicryl in the fascia under direct visualization using a PMI device. The abdomen was desufflated and all trocars removed. The skin incisions were closed with running subcuticular monocryl and Dermabond. The patient was awakened, extubated and transported to the recovery room in stable condition.   All counts were correct at the completion of the case.

## 2017-07-09 NOTE — Anesthesia Preprocedure Evaluation (Addendum)
Anesthesia Evaluation  Patient identified by MRN, date of birth, ID band Patient awake    Reviewed: Allergy & Precautions, NPO status , Patient's Chart, lab work & pertinent test results  History of Anesthesia Complications Negative for: history of anesthetic complications  Airway Mallampati: III  TM Distance: >3 FB Neck ROM: Full    Dental no notable dental hx. (+) Dental Advisory Given   Pulmonary asthma , sleep apnea , Current Smoker,    Pulmonary exam normal        Cardiovascular hypertension, Pt. on medications Normal cardiovascular exam     Neuro/Psych Seizures -,  PSYCHIATRIC DISORDERS Anxiety Depression Schizophrenia Brain tumor small pituitary mass?    GI/Hepatic Neg liver ROS,   Endo/Other  Morbid obesity  Renal/GU negative Renal ROS     Musculoskeletal   Abdominal   Peds  Hematology   Anesthesia Other Findings   Reproductive/Obstetrics Ovarian neoplasm                            Anesthesia Physical Anesthesia Plan  ASA: III  Anesthesia Plan: General   Post-op Pain Management:    Induction: Intravenous  PONV Risk Score and Plan: 4 or greater and Dexamethasone, Ondansetron, Scopolamine patch - Pre-op and Diphenhydramine  Airway Management Planned: Oral ETT  Additional Equipment:   Intra-op Plan:   Post-operative Plan: Extubation in OR  Informed Consent: I have reviewed the patients History and Physical, chart, labs and discussed the procedure including the risks, benefits and alternatives for the proposed anesthesia with the patient or authorized representative who has indicated his/her understanding and acceptance.   Dental advisory given  Plan Discussed with: Anesthesiologist  Anesthesia Plan Comments:        Anesthesia Quick Evaluation

## 2017-07-09 NOTE — Anesthesia Procedure Notes (Signed)
Procedure Name: Intubation Date/Time: 07/09/2017 10:37 AM Performed by: Maxwell Caul, CRNA Pre-anesthesia Checklist: Patient identified, Emergency Drugs available, Suction available and Patient being monitored Patient Re-evaluated:Patient Re-evaluated prior to induction Oxygen Delivery Method: Circle system utilized Preoxygenation: Pre-oxygenation with 100% oxygen Induction Type: IV induction Ventilation: Mask ventilation without difficulty Laryngoscope Size: Mac and 4 Grade View: Grade I Tube type: Oral Tube size: 7.5 mm Number of attempts: 1 Airway Equipment and Method: Stylet Placement Confirmation: ETT inserted through vocal cords under direct vision,  positive ETCO2 and breath sounds checked- equal and bilateral Secured at: 21 cm Tube secured with: Tape Dental Injury: Teeth and Oropharynx as per pre-operative assessment  Comments: Intubation by EMT student.

## 2017-07-09 NOTE — Anesthesia Postprocedure Evaluation (Signed)
Anesthesia Post Note  Patient: [name]  Procedure(s) Performed: LAPAROSCOPIC CHOLECYSTECTOMY (N/A Abdomen)     Patient location during evaluation: PACU Anesthesia Type: General Level of consciousness: sedated Pain management: pain level controlled Vital Signs Assessment: post-procedure vital signs reviewed and stable Respiratory status: spontaneous breathing and respiratory function stable Cardiovascular status: stable Postop Assessment: no apparent nausea or vomiting Anesthetic complications: yes Anesthetic complication details: PONV   Last Vitals:  Vitals:   07/09/17 1245 07/09/17 1300  BP: (!) 151/96 (!) 144/102  Pulse: 66 70  Resp: (!) 22 (!) 21  Temp:    SpO2: 99% 100%    Last Pain:  Vitals:   07/09/17 1245  TempSrc:   PainSc: Asleep                 Katrinia Straker DANIEL

## 2017-07-09 NOTE — Transfer of Care (Signed)
Immediate Anesthesia Transfer of Care Note  Patient: [name]  Procedure(s) Performed: LAPAROSCOPIC CHOLECYSTECTOMY (N/A Abdomen)  Patient Location: PACU  Anesthesia Type:General  Level of Consciousness: awake, alert  and oriented  Airway & Oxygen Therapy: Patient Spontanous Breathing and Patient connected to face mask oxygen  Post-op Assessment: Report given to RN and Post -op Vital signs reviewed and stable  Post vital signs: Reviewed and stable  Last Vitals:  Vitals Value Taken Time  BP 168/101 07/09/2017 11:45 AM  Temp    Pulse 85 07/09/2017 11:50 AM  Resp 16 07/09/2017 11:50 AM  SpO2 98 % 07/09/2017 11:50 AM  Vitals shown include unvalidated device data.  Last Pain:  Vitals:   07/09/17 0849  TempSrc:   PainSc: 8       Patients Stated Pain Goal: 6 (44/01/02 7253)  Complications: No apparent anesthesia complications

## 2017-07-09 NOTE — Progress Notes (Signed)
Pt will be admitted outpatient in bed due to social circumstances.

## 2017-07-09 NOTE — Discharge Instructions (Signed)
LAPAROSCOPIC SURGERY: POST OP INSTRUCTIONS  ######################################################################  EAT Gradually transition to a high fiber diet with a fiber supplement over the next few weeks after discharge.  Start with a pureed / full liquid diet (see below)  WALK Walk an hour a day.  Control your pain to do that.    CONTROL PAIN Control pain so that you can walk, sleep, tolerate sneezing/coughing, go up/down stairs.  HAVE A BOWEL MOVEMENT DAILY Keep your bowels regular to avoid problems.  OK to try a laxative to override constipation.  OK to use an antidairrheal to slow down diarrhea.  Call if not better after 2 tries  CALL IF YOU HAVE PROBLEMS/CONCERNS Call if you are still struggling despite following these instructions. Call if you have concerns not answered by these instructions  ######################################################################    1. DIET: Follow a light bland diet the first 24 hours after arrival home, such as soup, liquids, crackers, etc.  Be sure to include lots of fluids daily.  Avoid fast food or heavy meals as your are more likely to get nauseated.  Eat a low fat the next few days after surgery.   2. Take your usually prescribed home medications unless otherwise directed. 3. PAIN CONTROL: a. Pain is best controlled by a usual combination of three different methods TOGETHER: i. Ice/Heat ii. Over the counter pain medication iii. Prescription pain medication b. Most patients will experience some swelling and bruising around the incisions.  Ice packs or heating pads (30-60 minutes up to 6 times a day) will help. Use ice for the first few days to help decrease swelling and bruising, then switch to heat to help relax tight/sore spots and speed recovery.  Some people prefer to use ice alone, heat alone, alternating between ice & heat.  Experiment to what works for you.  Swelling and bruising can take several weeks to resolve.   c. It is  helpful to take an over-the-counter pain medication regularly for the first few weeks.  Choose one of the following that works best for you: i. Naproxen (Aleve, etc)  Two 251m tabs twice a day ii. Ibuprofen (Advil, etc) Three 2053mtabs four times a day (every meal & bedtime) iii. Acetaminophen (Tylenol, etc) 500-65044mour times a day (every meal & bedtime) d. A  prescription for pain medication (such as oxycodone, hydrocodone, etc) should be given to you upon discharge.  Take your pain medication as prescribed.  i. If you are having problems/concerns with the prescription medicine (does not control pain, nausea, vomiting, rash, itching, etc), please call us [phone] see if we need to switch you to a different pain medicine that will work better for you and/or control your side effect better. ii. If you need a refill on your pain medication, please contact your pharmacy.  They will contact our office to request authorization. Prescriptions will not be filled after 5 pm or on week-ends. 4. Avoid getting constipated.  Between the surgery and the pain medications, it is common to experience some constipation.  Increasing fluid intake and taking a fiber supplement (such as Metamucil, Citrucel, FiberCon, MiraLax, etc) 1-2 times a day regularly will usually help prevent this problem from occurring.  A mild laxative (prune juice, Milk of Magnesia, MiraLax, etc) should be taken according to package directions if there are no bowel movements after 48 hours.   5. Watch out for diarrhea.  If you have many loose bowel movements, simplify your diet to bland foods & liquids for  a few days.  Stop any stool softeners and decrease your fiber supplement.  Switching to mild anti-diarrheal medications (Kayopectate, Pepto Bismol) can help.  If this worsens or does not improve, please call us. 6. Wash / shower every day.  You may shower over the skin glue which is waterproof. No rubbing, scrubbing, lotions or  ointments to incisions. 7. Skin glue will flake off after about 2 weeks.  You may leave the incision open to air.  You may replace a dressing/Band-Aid to cover the incision for comfort if you wish.  8. ACTIVITIES as tolerated:   a. You may resume regular (light) daily activities beginning the next day--such as daily self-care, walking, climbing stairs--gradually increasing activities as tolerated.  If you can walk 30 minutes without difficulty, it is safe to try more intense activity such as jogging, treadmill, bicycling, low-impact aerobics, swimming, etc. b. Save the most intensive and strenuous activity for last such as sit-ups, heavy lifting, contact sports, etc  Refrain from any heavy lifting or straining until you are off narcotics for pain control.   c. DO NOT PUSH THROUGH PAIN.  Let pain be your guide: If it hurts to do something, don't do it.  Pain is your body warning you to avoid that activity for another week until the pain goes down. d. You may drive when you are no longer taking prescription pain medication, you can comfortably wear a seatbelt, and you can safely maneuver your car and apply brakes. e. Dennis Bast may have sexual intercourse when it is comfortable.  9. FOLLOW UP in our office a. Please call [facility] at [phone] to set up an appointment to see your surgeon in the office for a follow-up appointment approximately 2-3 weeks after your surgery. b. Make sure that you call for this appointment the day you arrive home to insure a convenient appointment time. 10. IF YOU HAVE DISABILITY OR FAMILY LEAVE FORMS, BRING THEM TO THE OFFICE FOR PROCESSING.  DO NOT GIVE THEM TO YOUR DOCTOR.   WHEN TO CALL us [phone]: 1. Poor pain control 2. Reactions / problems with new medications (rash/itching, nausea, etc)  3. Fever over 101.5 F (38.5 C) 4. Inability to urinate 5. Nausea and/or vomiting 6. Worsening swelling or bruising 7. Continued bleeding from incision. 8. Increased pain,  redness, or drainage from the incision   The clinic staff is available to answer your questions during regular business hours (8:30am-5pm).  Please dont hesitate to call and ask to speak to one of our nurses for clinical concerns.   If you have a medical emergency, go to the nearest emergency room or call 911.  A surgeon from [facility] is always on call at the [facility], [place], [place], [place], [place]  [address] ? MAIN: [phone] ? TOLL FREE: [phone] ?  FAX [phone] [email or web address]

## 2017-07-09 NOTE — Interval H&P Note (Signed)
History and Physical Interval Note:  07/09/2017 9:31 AM  [name]  has presented today for surgery, with the diagnosis of BILIARY COLIC  The various methods of treatment have been discussed with the patient and family. After consideration of risks, benefits and other options for treatment, the patient has consented to  Procedure(s): LAPAROSCOPIC CHOLECYSTECTOMY (N/A) as a surgical intervention .  The patient's history has been reviewed, patient examined, no change in status, stable for surgery.  I have reviewed the patient's chart and labs.  Questions were answered to the patient's satisfaction.     Janetta Vandoren Rich Brave

## 2017-07-10 DIAGNOSIS — K8010 Calculus of gallbladder with chronic cholecystitis without obstruction: Secondary | ICD-10-CM | POA: Diagnosis not present

## 2017-07-10 NOTE — Discharge Summary (Signed)
Physician Discharge Summary  Patient ID: [name] MRN: [id number] DOB/AGE: September 27, 1985 32 y.o.  Admit date: 07/09/2017 Discharge date: 07/10/2017  Admission Diagnoses: biliary colic  Discharge Diagnoses:  Active Problems:   Biliary colic   Discharged Condition: good  Hospital Course: She was admitted for observation following uncomplicated laparoscopic cholecystectomy. Overnight her pain was well controlled. Hypertensive but vitals otherwise normal. Tolerated a diet, ambulating unassisted, passing flatus, appropriate urine output.  Consults: None  Significant Diagnostic Studies: no  Treatments: surgery: as above  Discharge Exam: Blood pressure (!) 139/95, pulse (!) 58, temperature 98.9 F (37.2 C), temperature source Oral, resp. rate 18, height 5\' 4"  (1.626 m), weight 117.9 kg (260 lb), SpO2 97 %. General appearance: alert and cooperative Resp: unlabored, symmetrical air entry Cardio: regular rate and rhythm GI: soft, appropriately tender, incisions c/d/i with dermabond. No erythema, induration or ecchymosis.  Skin: Skin color, texture, turgor normal. No rashes or lesions Neurologic: Grossly normal  Disposition: Discharge disposition: 01-Home or Self Care        Allergies as of 07/10/2017      Reactions   Ketorolac Tromethamine Hives   Latex Hives   Lortab [hydrocodone-acetaminophen] Hives, Other (See Comments)   Tolerates acetaminophen   Tramadol Hives      Medication List    STOP taking these medications   dicyclomine 20 MG tablet Commonly known as:  BENTYL   ibuprofen 800 MG tablet Commonly known as:  ADVIL,MOTRIN   oxyCODONE-acetaminophen 10-325 MG tablet Commonly known as:  PERCOCET Replaced by:  oxyCODONE-acetaminophen 5-325 MG tablet   phenazopyridine 200 MG tablet Commonly known as:  PYRIDIUM   potassium chloride SA 20 MEQ tablet Commonly known as:  K-DUR,KLOR-CON   promethazine 25 MG tablet Commonly known as:  PHENERGAN      TAKE these medications   docusate sodium 100 MG capsule Commonly known as:  COLACE Take 1 capsule (100 mg total) by mouth 2 (two) times daily.   hydrochlorothiazide 25 MG tablet Commonly known as:  HYDRODIURIL Take 1 tablet (25 mg total) by mouth daily. What changed:  how much to take   oxyCODONE-acetaminophen 5-325 MG tablet Commonly known as:  PERCOCET/ROXICET Take 1 tablet by mouth every 6 (six) hours as needed for severe pain. Replaces:  oxyCODONE-acetaminophen 10-325 MG tablet   QUEtiapine 100 MG tablet Commonly known as:  SEROQUEL Take 1 tablet (100 mg total) by mouth at bedtime.   sertraline 25 MG tablet Commonly known as:  ZOLOFT Take 25 mg by mouth daily.      Follow-up Information    Clovis Riley, MD In 3 weeks.   Specialty:  General Surgery Contact information: [address] [place] [address] [phone]           Signed: Clovis Riley 07/10/2017, 8:22 AM

## 2017-07-10 NOTE — Progress Notes (Signed)
Pt alert and oriented, tolerating diet.  D/C instructions and prescription was given.  All questions answered.

## 2017-08-25 NOTE — Congregational Nurse Program (Signed)
Congregational Nurse Program Note  Date of Encounter: 08/21/2017  Past Medical History: Past Medical History:  Diagnosis Date  . Anxiety   . Asthma   . Brain tumor ([place])   . Cancer ([place]) 1991   ovarian  . Depression   . 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 Hypertension   . Kidney stone   . Kidney stones   . Ovarian cancer ([place])   . Schizoaffective disorder   . Seizures [facility])     Encounter Details: CNP Questionnaire - 08/21/17 1536      Questionnaire   Patient Status  Not Applicable    Race  Black or African American    Location Patient Served At  Not Applicable    Uninsured  Uninsured (NEW 1x/quarter)    Food  Yes, have food insecurities;Within past 12 months, worried food would run out with no money to buy more;Within past 12 months, food ran out with no money to buy more    Housing/Utilities  No permanent housing    Transportation  Yes, need transportation assistance;Provided transportation assistance (bus pass, taxi voucher, etc.);Within past 12 months, lack of transportation negatively impacted life    Interpersonal Safety  No, do not feel physically and emotionally safe where you currently live    Medication  Yes, have medication insecurities    Medical Provider  Yes    Referrals  Not Applicable    ED Visit Averted  Not Applicable    Life-Saving Intervention Made  Not Applicable       Client was referred to see me due to a current crisis.  Client reports her brother was killed a few days ago.  Client is crying and presenting with appropriate grief. Client's plan is to seek out her therapist and stay connected with support services.  Encouraged client to return on Friday to see me

## 2018-01-28 ENCOUNTER — Emergency Department (HOSPITAL_COMMUNITY)
Admission: EM | Admit: 2018-01-28 | Discharge: 2018-01-28 | Disposition: A | Discharge status: Home / Self Care | Payer: Medicaid Other | Attending: Emergency Medicine | Admitting: Emergency Medicine

## 2018-01-28 ENCOUNTER — Encounter (HOSPITAL_COMMUNITY): Payer: Self-pay

## 2018-01-28 ENCOUNTER — Other Ambulatory Visit: Payer: Self-pay

## 2018-01-28 DIAGNOSIS — F17210 Nicotine dependence, cigarettes, uncomplicated: Secondary | ICD-10-CM | POA: Diagnosis not present

## 2018-01-28 DIAGNOSIS — Z91040 Latex allergy status: Secondary | ICD-10-CM | POA: Diagnosis not present

## 2018-01-28 DIAGNOSIS — I10 Essential (primary) hypertension: Secondary | ICD-10-CM | POA: Diagnosis not present

## 2018-01-28 DIAGNOSIS — Z8543 Personal history of malignant neoplasm of ovary: Secondary | ICD-10-CM | POA: Insufficient documentation

## 2018-01-28 DIAGNOSIS — Z79899 Other long term (current) drug therapy: Secondary | ICD-10-CM | POA: Diagnosis not present

## 2018-01-28 DIAGNOSIS — R1013 Epigastric pain: Secondary | ICD-10-CM | POA: Diagnosis present

## 2018-01-28 LAB — COMPREHENSIVE METABOLIC PANEL
ALBUMIN: 3.5 g/dL (ref 3.5–5.0)
ALT: 12 U/L (ref 0–44)
ANION GAP: 8 (ref 5–15)
AST: 16 U/L (ref 15–41)
Alkaline Phosphatase: 62 U/L (ref 38–126)
BILIRUBIN TOTAL: 0.3 mg/dL (ref 0.3–1.2)
BUN: 10 mg/dL (ref 6–20)
CHLORIDE: 108 mmol/L (ref 98–111)
CO2: 24 mmol/L (ref 22–32)
CREATININE: 1 mg/dL (ref 0.44–1.00)
Calcium: 8.8 mg/dL — ABNORMAL LOW (ref 8.9–10.3)
GFR calc non Af Amer: >60 mL/min (ref >60)
Glucose, Bld: 112 mg/dL — ABNORMAL HIGH (ref 70–99)
POTASSIUM: 3.3 mmol/L — AB (ref 3.5–5.1)
Sodium: 140 mmol/L (ref 135–145)
Total Protein: 6.7 g/dL (ref 6.5–8.1)

## 2018-01-28 LAB — CBC WITH DIFFERENTIAL/PLATELET
ABS IMMATURE GRANULOCYTES: 0.03 10*3/uL (ref 0.00–0.07)
Basophils Absolute: 0 10*3/uL (ref 0.0–0.1)
Basophils Relative: 0 %
EOS PCT: 1 %
Eosinophils Absolute: 0.1 10*3/uL (ref 0.0–0.5)
HEMATOCRIT: 36.6 % (ref 36.0–46.0)
Hemoglobin: 11.2 g/dL — ABNORMAL LOW (ref 12.0–15.0)
Immature Granulocytes: 0 %
LYMPHS ABS: 4 10*3/uL (ref 0.7–4.0)
Lymphocytes Relative: 40 %
MCH: 30.4 pg (ref 26.0–34.0)
MCHC: 30.6 g/dL (ref 30.0–36.0)
MCV: 99.2 fL (ref 80.0–100.0)
MONO ABS: 0.5 10*3/uL (ref 0.1–1.0)
Monocytes Relative: 5 %
Neutro Abs: 5.3 10*3/uL (ref 1.7–7.7)
Neutrophils Relative %: 54 %
Platelets: 356 10*3/uL (ref 150–400)
RBC: 3.69 MIL/uL — ABNORMAL LOW (ref 3.87–5.11)
RDW: 12.4 % (ref 11.5–15.5)
WBC: 9.9 10*3/uL (ref 4.0–10.5)
nRBC: 0 % (ref 0.0–0.2)

## 2018-01-28 LAB — URINALYSIS, ROUTINE W REFLEX MICROSCOPIC
Bilirubin Urine: NEGATIVE
Glucose, UA: NEGATIVE mg/dL
HGB URINE DIPSTICK: NEGATIVE
Ketones, ur: NEGATIVE mg/dL
Leukocytes, UA: NEGATIVE
NITRITE: NEGATIVE
PROTEIN: NEGATIVE mg/dL
SPECIFIC GRAVITY, URINE: 1.014 (ref 1.005–1.030)
pH: 6 (ref 5.0–8.0)

## 2018-01-28 LAB — LIPASE, BLOOD: LIPASE: 34 U/L (ref 11–51)

## 2018-01-28 MED ORDER — SODIUM CHLORIDE 0.9 % IV BOLUS
1000.0000 mL | Freq: Once | INTRAVENOUS | Status: AC
  Administered 2018-01-28: 1000 mL via INTRAVENOUS

## 2018-01-28 MED ORDER — PROMETHAZINE HCL 25 MG/ML IJ SOLN
12.5000 mg | Freq: Once | INTRAMUSCULAR | Status: AC
  Administered 2018-01-28: 12.5 mg via INTRAVENOUS
  Filled 2018-01-28: qty 1

## 2018-01-28 MED ORDER — HYDROMORPHONE HCL 1 MG/ML IJ SOLN
1.0000 mg | Freq: Once | INTRAMUSCULAR | Status: AC
  Administered 2018-01-28: 1 mg via INTRAVENOUS
  Filled 2018-01-28: qty 1

## 2018-01-28 MED ORDER — FAMOTIDINE IN NACL 20-0.9 MG/50ML-% IV SOLN
20.0000 mg | Freq: Once | INTRAVENOUS | Status: AC
  Administered 2018-01-28: 20 mg via INTRAVENOUS
  Filled 2018-01-28: qty 50

## 2018-01-28 MED ORDER — ONDANSETRON HCL 4 MG/2ML IJ SOLN
4.0000 mg | Freq: Once | INTRAMUSCULAR | Status: AC
  Administered 2018-01-28: 4 mg via INTRAVENOUS
  Filled 2018-01-28: qty 2

## 2018-01-28 MED ORDER — PANTOPRAZOLE SODIUM 40 MG PO TBEC: 40.0000 mg | DELAYED_RELEASE_TABLET | Freq: Every day | ORAL | 0 refills | Status: DC

## 2018-01-28 MED ORDER — ONDANSETRON HCL 4 MG PO TABS: 4.0000 mg | ORAL_TABLET | Freq: Four times a day (QID) | ORAL | 0 refills | Status: DC

## 2018-01-28 NOTE — Discharge Instructions (Addendum)
Tests showed no life-threatening condition.  Prescription for pain related to ulcers and nausea.  Follow-up your doctor.

## 2018-01-28 NOTE — ED Notes (Signed)
Urine culture sent down with UA in case of add-on

## 2018-01-28 NOTE — ED Notes (Signed)
Patient ambulatory to bathroom with steady gait at this time to provide urine sample 

## 2018-01-28 NOTE — ED Triage Notes (Signed)
Pt arrives to ED from [facility], while pt was there for peer support with complaints of n/v/d since yesterday. EMS reports pt had lots of good thanksgiving food and has had difficulty with nausea and intermittent vomiting for the past few days. pt states she just had her gallbladder out 3 months ago. Pt placed in position of comfort with bed locked and lowered, call bell in reach.

## 2018-01-28 NOTE — ED Notes (Signed)
Pt requesting more pain medication, MD aware

## 2018-01-28 NOTE — ED Provider Notes (Signed)
[facility] EMERGENCY DEPARTMENT Provider Note   CSN: [id number] Arrival date & time: 01/28/18  1442     History   Chief Complaint Chief Complaint  Patient presents with  . Nausea  . Emesis    HPI [name] is a 32 y.o. female.  Nausea, vomiting, diarrhea since yesterday with associated epigastric pain.  Status post cholecystectomy approximately 2 months ago.  Severity of symptoms is mild to moderate.  Nothing makes symptoms better or worse.     Past Medical History:  Diagnosis Date  . Anxiety   . Asthma   . Brain tumor ([place])   . Cancer ([place]) 1991   ovarian  . Depression   . 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 Hypertension   . Kidney stone   . Kidney stones   . Ovarian cancer ([place])   . Schizoaffective disorder   . Seizures [facility])     Patient Active Problem List   Diagnosis Date Noted  . Biliary colic 32/54/9826  . Tobacco use disorder 09/01/2015  . Cannabis use disorder, moderate, dependence ([place]) 09/01/2015  . OSA (obstructive sleep apnea) 10/07/2013  . Upper airway cough syndrome 09/03/2013  . Essential hypertension 09/03/2013  . Schizoaffective disorder, depressive type ([place]) 02/22/2011    Past Surgical History:  Procedure Laterality Date  . ABDOMINAL HYSTERECTOMY     partial; took fallopian tubes and ovaries  . APPENDECTOMY    . CHOLECYSTECTOMY N/A 07/09/2017   Procedure: LAPAROSCOPIC CHOLECYSTECTOMY;  Surgeon: Clovis Riley, MD;  Location: [facility] ORS;  Service: General;  Laterality: N/A;  . kidney stone removal       OB History    Gravida  0   Para  0   Term  0   Preterm  0   AB  0   Living  0     SAB  0   TAB  0   Ectopic  0   Multiple  0   Live Births               Home Medications    Prior to Admission medications   Medication Sig Start Date End Date Taking? Authorizing Provider  hydrochlorothiazide (HYDRODIURIL) 25 MG tablet Take 1 tablet (25 mg total) by mouth  daily. Patient taking differently: Take 12.5 mg by mouth daily.  09/02/15   Pucilowska, Jolanta B, MD  ondansetron (ZOFRAN) 4 MG tablet Take 1 tablet (4 mg total) by mouth every 6 (six) hours. 01/28/18   Nat Christen, MD  oxyCODONE-acetaminophen (PERCOCET/ROXICET) 5-325 MG tablet Take 1 tablet by mouth every 6 (six) hours as needed for severe pain. 07/09/17   Clovis Riley, MD  pantoprazole (PROTONIX) 40 MG tablet Take 1 tablet (40 mg total) by mouth daily. 01/28/18   Nat Christen, MD  QUEtiapine (SEROQUEL) 100 MG tablet Take 1 tablet (100 mg total) by mouth at bedtime. 09/02/15   Pucilowska, Jolanta B, MD  sertraline (ZOLOFT) 25 MG tablet Take 25 mg by mouth daily.  04/25/16   [provider]    Family History Family History  Problem Relation Age of Onset  . Diabetes type II Other   . Asthma Mother     Social History Social History   Tobacco Use  . Smoking status: Heavy Tobacco Smoker    Packs/day: 2.00    Years: 2.00    Pack years: 4.00    Types: Cigarettes    Last attempt to quit: 07/27/2012  Years since quitting: 5.5  . Smokeless tobacco: Never Used  Substance Use Topics  . Alcohol use: No  . Drug use: Yes    Frequency: 7.0 times per week    Types: Marijuana    Comment: Sts quit 2-3 months ago     Allergies   Ketorolac tromethamine; Latex; Lortab [hydrocodone-acetaminophen]; and Tramadol   Review of Systems Review of Systems  All other systems reviewed and are negative.    Physical Exam Updated Vital Signs BP (!) 147/85 (BP Location: Right Arm)   Pulse 73   Temp 98.2 F (36.8 C) (Oral)   Resp (!) 22   Ht 5\' 4"  (1.626 m)   Wt 108.9 kg   SpO2 98%   BMI 41.20 kg/m   Physical Exam  Constitutional: She is oriented to person, place, and time. She appears well-developed and well-nourished.  Obese, no acute distress.  HENT:  Head: Normocephalic and atraumatic.  Eyes: Conjunctivae are normal.  Neck: Neck supple.  Cardiovascular: Normal rate and regular  rhythm.  Pulmonary/Chest: Effort normal and breath sounds normal.  Abdominal: Soft. Bowel sounds are normal.  Minimal epigastric tenderness.  Musculoskeletal: Normal range of motion.  Neurological: She is alert and oriented to person, place, and time.  Skin: Skin is warm and dry.  Psychiatric: She has a normal mood and affect. Her behavior is normal.  Nursing note and vitals reviewed.    ED Treatments / Results  Labs (all labs ordered are listed, but only abnormal results are displayed) Labs Reviewed  CBC WITH DIFFERENTIAL/PLATELET - Abnormal; Notable for the following components:      Result Value   RBC 3.69 (*)    Hemoglobin 11.2 (*)    All other components within normal limits  COMPREHENSIVE METABOLIC PANEL - Abnormal; Notable for the following components:   Potassium 3.3 (*)    Glucose, Bld 112 (*)    Calcium 8.8 (*)    All other components within normal limits  LIPASE, BLOOD  URINALYSIS, ROUTINE W REFLEX MICROSCOPIC    EKG EKG Interpretation  Date/Time:  Tuesday January 28 2018 14:50:34 EST Ventricular Rate:  78 PR Interval:    QRS Duration: 83 QT Interval:  387 QTC Calculation: 441 R Axis:   70 Text Interpretation:  Atrial fibrillation Confirmed by Nat Christen [phone]) on 01/28/2018 5:49:19 PM   Radiology No results found.  Procedures Procedures (including critical care time)  Medications Ordered in ED Medications  sodium chloride 0.9 % bolus 1,000 mL (0 mLs Intravenous Stopped 01/28/18 1629)  ondansetron (ZOFRAN) injection 4 mg (4 mg Intravenous Given 01/28/18 1526)  HYDROmorphone (DILAUDID) injection 1 mg (1 mg Intravenous Given 01/28/18 1527)  famotidine (PEPCID) IVPB 20 mg premix (0 mg Intravenous Stopped 01/28/18 1909)  promethazine (PHENERGAN) injection 12.5 mg (12.5 mg Intravenous Given 01/28/18 1835)     Initial Impression / Assessment and Plan / ED Course  I have reviewed the triage vital signs and the nursing notes.  Pertinent labs & imaging  results that were available during my care of the patient were reviewed by me and considered in my medical decision making (see chart for details).     Patient presents with epigastric tenderness, nausea, vomiting, diarrhea.  No acute abdomen on physical exam.  She feels better after IV fluids, IV pain meds, IV Pepcid, IV Phenergan.  Discharge medication Protonix 40 mg of Zofran 4 mg  Final Clinical Impressions(s) / ED Diagnoses   Final diagnoses:  Epigastric pain    ED Discharge  Orders         Ordered    pantoprazole (PROTONIX) 40 MG tablet  Daily     01/28/18 1938    ondansetron (ZOFRAN) 4 MG tablet  Every 6 hours     01/28/18 1938           Nat Christen, MD 01/28/18 1945

## 2018-04-01 ENCOUNTER — Other Ambulatory Visit: Payer: Self-pay

## 2018-04-01 ENCOUNTER — Emergency Department (HOSPITAL_COMMUNITY): Payer: Medicaid Other

## 2018-04-01 ENCOUNTER — Emergency Department (HOSPITAL_COMMUNITY)
Admission: EM | Admit: 2018-04-01 | Discharge: 2018-04-01 | Disposition: A | Discharge status: Home / Self Care | Payer: Medicaid Other | Attending: Emergency Medicine | Admitting: Emergency Medicine

## 2018-04-01 ENCOUNTER — Encounter (HOSPITAL_COMMUNITY): Payer: Self-pay

## 2018-04-01 DIAGNOSIS — Z8543 Personal history of malignant neoplasm of ovary: Secondary | ICD-10-CM | POA: Insufficient documentation

## 2018-04-01 DIAGNOSIS — Y929 Unspecified place or not applicable: Secondary | ICD-10-CM | POA: Insufficient documentation

## 2018-04-01 DIAGNOSIS — F17210 Nicotine dependence, cigarettes, uncomplicated: Secondary | ICD-10-CM | POA: Insufficient documentation

## 2018-04-01 DIAGNOSIS — Y939 Activity, unspecified: Secondary | ICD-10-CM | POA: Insufficient documentation

## 2018-04-01 DIAGNOSIS — S76102A Unspecified injury of left quadriceps muscle, fascia and tendon, initial encounter: Secondary | ICD-10-CM | POA: Insufficient documentation

## 2018-04-01 DIAGNOSIS — Y999 Unspecified external cause status: Secondary | ICD-10-CM | POA: Insufficient documentation

## 2018-04-01 DIAGNOSIS — J45909 Unspecified asthma, uncomplicated: Secondary | ICD-10-CM | POA: Insufficient documentation

## 2018-04-01 DIAGNOSIS — Z91040 Latex allergy status: Secondary | ICD-10-CM | POA: Insufficient documentation

## 2018-04-01 DIAGNOSIS — R200 Anesthesia of skin: Secondary | ICD-10-CM | POA: Insufficient documentation

## 2018-04-01 DIAGNOSIS — X500XXA Overexertion from strenuous movement or load, initial encounter: Secondary | ICD-10-CM | POA: Insufficient documentation

## 2018-04-01 DIAGNOSIS — S79922A Unspecified injury of left thigh, initial encounter: Secondary | ICD-10-CM | POA: Diagnosis present

## 2018-04-01 DIAGNOSIS — I10 Essential (primary) hypertension: Secondary | ICD-10-CM | POA: Diagnosis not present

## 2018-04-01 DIAGNOSIS — S76109A Unspecified injury of unspecified quadriceps muscle, fascia and tendon, initial encounter: Secondary | ICD-10-CM

## 2018-04-01 MED ORDER — OXYCODONE-ACETAMINOPHEN 5-325 MG PO TABS
1.0000 | ORAL_TABLET | Freq: Once | ORAL | Status: AC
  Administered 2018-04-01: 1 via ORAL
  Filled 2018-04-01: qty 1

## 2018-04-01 MED ORDER — HYDROMORPHONE HCL 1 MG/ML IJ SOLN
1.0000 mg | Freq: Once | INTRAMUSCULAR | Status: AC
  Administered 2018-04-01: 1 mg via INTRAMUSCULAR
  Filled 2018-04-01: qty 1

## 2018-04-01 MED ORDER — OXYCODONE-ACETAMINOPHEN 5-325 MG PO TABS: 1.0000 | ORAL_TABLET | Freq: Four times a day (QID) | ORAL | 0 refills | Status: DC | PRN

## 2018-04-01 NOTE — ED Notes (Signed)
Attempted lab draw pt asked staff to stop and refused further attempts

## 2018-04-01 NOTE — ED Triage Notes (Signed)
Patient states she had numbness of he left arm last week when she had elevated BP. patient states she is compliant  With her BP meds.  Patient c/o left knee pain and states she hyperextended the left knee pain x 1 month and worsening x the past 2-3 days.

## 2018-04-01 NOTE — ED Notes (Signed)
Patient transported to CT 

## 2018-04-01 NOTE — ED Provider Notes (Signed)
Emergency Department Provider Note   I have reviewed the triage vital signs and the nursing notes.   HISTORY  Chief Complaint Hypertension and Knee Pain   HPI [name] is a 33 y.o. female with multiple symptoms.  Patient seems like her main complaint is left knee discomfort.  She states that she hyperextended it about a month ago but then over the last week every time she walks she feels like it hyperextends again and she has severe pain to the point where she is not able to walk.  She also notices that her lower leg is swelling so she presents here for that.  Also the patient states that she drags her foot for the last couple weeks and she is unsure why and noticed that she has some numbness in her left pointer finger.  She has no numbness anywhere else.  No other neurologic symptoms.  No cardiac symptoms such as chest pain, shortness of breath, diaphoresis.  She not sure if she is had any strokes in the past however she does not think so.  She has multiple past medical problems as documented below but does not feel that this is consistent with any of them. No other associated or modifying symptoms.    Past Medical History:  Diagnosis Date  . Anxiety   . Asthma   . Brain tumor ([place])   . Cancer ([place]) 1991   ovarian  . Depression   . 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 Hypertension   . Kidney stone   . Kidney stones   . Ovarian cancer ([place])   . Schizoaffective disorder   . Seizures [facility])     Patient Active Problem List   Diagnosis Date Noted  . Biliary colic 35/00/9381  . Tobacco use disorder 09/01/2015  . Cannabis use disorder, moderate, dependence ([place]) 09/01/2015  . OSA (obstructive sleep apnea) 10/07/2013  . Upper airway cough syndrome 09/03/2013  . Essential hypertension 09/03/2013  . Schizoaffective disorder, depressive type ([place]) 02/22/2011    Past Surgical History:  Procedure Laterality Date  . ABDOMINAL HYSTERECTOMY     partial; took fallopian tubes and ovaries  . APPENDECTOMY    . CHOLECYSTECTOMY N/A 07/09/2017   Procedure: LAPAROSCOPIC CHOLECYSTECTOMY;  Surgeon: Clovis Riley, MD;  Location: [facility];  Service: General;  Laterality: N/A;  . kidney stone removal      Current Outpatient Rx  . Order #: [id number] Class: Print  . Order #: [id number] Class: Print    Allergies Ketorolac tromethamine; Latex; Lortab [hydrocodone-acetaminophen]; and Tramadol  Family History  Problem Relation Age of Onset  . Diabetes type II Other   . Asthma Mother     Social History Social History   Tobacco Use  . Smoking status: Light Tobacco Smoker    Packs/day: 0.15    Years: 2.00    Pack years: 0.30    Types: Cigarettes    Last attempt to quit: 07/27/2012    Years since quitting: 5.6  . Smokeless tobacco: Never Used  Substance Use Topics  . Alcohol use: No  . Drug use: Yes    Frequency: 7.0 times per week    Types: Marijuana    Review of Systems  All other systems negative except as documented in the HPI. All pertinent positives and negatives as reviewed in the HPI. ____________________________________________   PHYSICAL EXAM:  VITAL SIGNS: ED Triage Vitals  Enc Vitals Group     BP 04/01/18 1523 (!) 156/94  Pulse Rate 04/01/18 1523 93     Resp 04/01/18 1523 18     Temp 04/01/18 1523 98.8 F (37.1 C)     Temp Source 04/01/18 1523 Oral     SpO2 04/01/18 1523 96 %     Weight --      Height 04/01/18 1528 5\' 5"  (1.651 m)    Constitutional: Alert and oriented. Well appearing and in no acute distress. Eyes: Conjunctivae are normal. PERRL. EOMI. Head: Atraumatic. Nose: No congestion/rhinnorhea. Mouth/Throat: Mucous membranes are moist.  Oropharynx non-erythematous. Neck: No stridor.  No meningeal signs.   Cardiovascular: Normal rate, regular rhythm. Good peripheral circulation. Grossly normal heart sounds.   Respiratory: Normal respiratory effort.  No retractions. Lungs  CTAB. Gastrointestinal: Soft and nontender. No distention.  Musculoskeletal: No lower extremity tenderness nor edema. No gross deformities of extremities. Neurologic:  Normal speech and language. No gross focal neurologic deficits are appreciated.  Able to dorsiflex and plantarflex her feet bilaterally symmetrically.  Gross strength and sensation of bilateral lower extremities is the same.  She has normal strength in bicep, tricep, hand flexion and extension in both upper extremities.  She has decreased sensation to pain and light touch in her left pointer finger circumferentially but the rest of the hand and all other fingers are normal.  Will to raise her eyebrows normally, extraocular movements are normal uvula elevates symmetrically, normal sensation in upper middle and lower parts of her face symmetric, tongue protrudes midline. Skin:  Skin is warm, dry and intact. No rash noted.   ____________________________________________   LABS (all labs ordered are listed, but only abnormal results are displayed)  Labs Reviewed  CBC WITH DIFFERENTIAL/PLATELET  COMPREHENSIVE METABOLIC PANEL   ____________________________________________   RADIOLOGY  Dg Ankle Complete Left  Result Date: 04/01/2018 CLINICAL DATA:  Left knee and ankle pain for the past month following a hyperextension injury, worse over the past 2-3 days. EXAM: LEFT ANKLE COMPLETE - 3+ VIEW COMPARISON:  None. FINDINGS: Diffuse soft tissue swelling. No fracture, dislocation or effusion seen. Mild posterior calcaneal spur formation and mild dorsal tarsal spur formation. IMPRESSION: 1. Diffuse soft tissue swelling without fracture or dislocation. 2. Mild degenerative spur formation. Electronically Signed   By: Claudie Revering M.D.   On: 04/01/2018 20:14   Ct Head Wo Contrast  Result Date: 04/01/2018 CLINICAL DATA:  Left arm numbness last week and her blood pressure was high. EXAM: CT HEAD WITHOUT CONTRAST TECHNIQUE: Contiguous axial images  were obtained from the base of the skull through the vertex without intravenous contrast. COMPARISON:  11/04/2015. FINDINGS: Brain: Normal appearing cerebral hemispheres and posterior fossa structures. Normal size and position of the ventricles. No intracranial hemorrhage, mass lesion or CT evidence of acute infarction. Vascular: No hyperdense vessel or unexpected calcification. Skull: Diffuse hyperostosis, most pronounced in the frontal regions. Sinuses/Orbits: Unremarkable. Other: None. IMPRESSION: No acute abnormality. Electronically Signed   By: Claudie Revering M.D.   On: 04/01/2018 20:10   Dg Knee Complete 4 Views Left  Result Date: 04/01/2018 CLINICAL DATA:  Patient complains of left knee pain and states she hyperextended the left knee pain x 1 month and worsening x the past 2-3 days. EXAM: LEFT KNEE - COMPLETE 4+ VIEW COMPARISON:  None. FINDINGS: There is no acute fracture. The knee appears hyperextended. There is irregularity and thickening of the distal quadriceps tendon superior to the patella, raising the question of injury of the quadriceps tendon. No joint effusion is present. IMPRESSION: Question of injury of the  quadriceps tendon. No joint effusion or fracture. Consider further evaluation with MRI as needed. Electronically Signed   By: Nolon Nations M.D.   On: 04/01/2018 16:23    ____________________________________________   INITIAL IMPRESSION / ASSESSMENT AND PLAN / ED COURSE  Constellation of symptoms that I am unclear the etiology at this point.  I feel like she probably hyperextended her knee and has some type of meniscal or tendon injury there but she has lower extremity swelling left greater than right so we will need a DVT study to make sure there is doubt that.  But the foot dragging when she walks it may just be related to the knee pain and knee discomfort however with multiple comorbidities will need to evaluate for stroke with a CT of her head.  I think she is low risk for that  is been going on long enough that if that is negative I would not pursue an MRI.  Her blood pressures are high here she will need that rechecked.  Refused labs. Likely quad injury, will need ortho follow up for MRI. Given crutches/knee immobilizer and pain meds.    Pertinent labs & imaging results that were available during my care of the patient were reviewed by me and considered in my medical decision making (see chart for details).  ____________________________________________  FINAL CLINICAL IMPRESSION(S) / ED DIAGNOSES  Final diagnoses:  Injury of quadriceps tendon     MEDICATIONS GIVEN DURING THIS VISIT:  Medications  oxyCODONE-acetaminophen (PERCOCET/ROXICET) 5-325 MG per tablet 1 tablet (1 tablet Oral Given 04/01/18 1936)  HYDROmorphone (DILAUDID) injection 1 mg (1 mg Intramuscular Given 04/01/18 2139)     NEW OUTPATIENT MEDICATIONS STARTED DURING THIS VISIT:  Discharge Medication List as of 04/01/2018 10:31 PM      Note:  This note was prepared with assistance of Dragon voice recognition software. Occasional wrong-word or sound-a-like substitutions may have occurred due to the inherent limitations of voice recognition software.   Merrily Pew, MD 04/01/18 [phone]

## 2018-04-03 ENCOUNTER — Telehealth (INDEPENDENT_AMBULATORY_CARE_PROVIDER_SITE_OTHER): Payer: Self-pay | Admitting: Surgery

## 2018-04-03 ENCOUNTER — Encounter (HOSPITAL_COMMUNITY): Payer: Self-pay

## 2018-04-03 ENCOUNTER — Ambulatory Visit (HOSPITAL_COMMUNITY)
Admission: RE | Admit: 2018-04-03 | Discharge: 2018-04-03 | Disposition: A | Discharge status: Home / Self Care | Payer: Medicaid Other | Source: Ambulatory Visit | Attending: Surgery | Admitting: Surgery

## 2018-04-03 ENCOUNTER — Encounter (INDEPENDENT_AMBULATORY_CARE_PROVIDER_SITE_OTHER): Payer: Self-pay | Admitting: Surgery

## 2018-04-03 ENCOUNTER — Other Ambulatory Visit (INDEPENDENT_AMBULATORY_CARE_PROVIDER_SITE_OTHER): Payer: Self-pay | Admitting: Surgery

## 2018-04-03 ENCOUNTER — Emergency Department (HOSPITAL_COMMUNITY)
Admission: EM | Admit: 2018-04-03 | Discharge: 2018-04-03 | Disposition: A | Discharge status: Home / Self Care | Payer: Medicaid Other | Attending: Emergency Medicine | Admitting: Emergency Medicine

## 2018-04-03 ENCOUNTER — Other Ambulatory Visit (HOSPITAL_COMMUNITY): Payer: Self-pay

## 2018-04-03 ENCOUNTER — Ambulatory Visit (INDEPENDENT_AMBULATORY_CARE_PROVIDER_SITE_OTHER): Payer: Medicaid Other | Admitting: Surgery

## 2018-04-03 DIAGNOSIS — Z8659 Personal history of other mental and behavioral disorders: Secondary | ICD-10-CM

## 2018-04-03 DIAGNOSIS — J45909 Unspecified asthma, uncomplicated: Secondary | ICD-10-CM | POA: Insufficient documentation

## 2018-04-03 DIAGNOSIS — M25562 Pain in left knee: Secondary | ICD-10-CM | POA: Insufficient documentation

## 2018-04-03 DIAGNOSIS — M79662 Pain in left lower leg: Secondary | ICD-10-CM | POA: Insufficient documentation

## 2018-04-03 DIAGNOSIS — Z79899 Other long term (current) drug therapy: Secondary | ICD-10-CM | POA: Insufficient documentation

## 2018-04-03 DIAGNOSIS — F17210 Nicotine dependence, cigarettes, uncomplicated: Secondary | ICD-10-CM | POA: Insufficient documentation

## 2018-04-03 DIAGNOSIS — I10 Essential (primary) hypertension: Secondary | ICD-10-CM | POA: Diagnosis not present

## 2018-04-03 DIAGNOSIS — Z91040 Latex allergy status: Secondary | ICD-10-CM | POA: Insufficient documentation

## 2018-04-03 MED ORDER — IBUPROFEN 800 MG PO TABS: 800.0000 mg | ORAL_TABLET | Freq: Three times a day (TID) | ORAL | 0 refills | Status: DC

## 2018-04-03 MED ORDER — HYDROMORPHONE HCL 1 MG/ML IJ SOLN
1.0000 mg | Freq: Once | INTRAMUSCULAR | Status: AC
  Administered 2018-04-03: 1 mg via INTRAVENOUS
  Filled 2018-04-03: qty 1

## 2018-04-03 MED ORDER — IBUPROFEN 800 MG PO TABS
800.0000 mg | ORAL_TABLET | Freq: Once | ORAL | Status: AC
  Administered 2018-04-03: 800 mg via ORAL
  Filled 2018-04-03: qty 1

## 2018-04-03 MED ORDER — LORAZEPAM 2 MG/ML IJ SOLN
1.0000 mg | Freq: Once | INTRAMUSCULAR | Status: AC
  Administered 2018-04-03: 1 mg via INTRAVENOUS
  Filled 2018-04-03: qty 1

## 2018-04-03 MED ORDER — DIAZEPAM 5 MG PO TABS: 5.0000 mg | ORAL_TABLET | Freq: Once | ORAL | Status: DC

## 2018-04-03 NOTE — Progress Notes (Addendum)
Office Visit Note   Patient: [name]           Date of Birth: [birth year]           MRN: [id number] Visit Date: 04/03/2018              Requested by: Eston Esters, NP [address], [place] [address] PCP: Eston Esters, NP   Assessment & Plan: Visit Diagnoses:  1. Acute pain of left knee   2. Pain of left calf     Plan: With patient's increase left knee pain with question of instability, locking recommend getting an MRI to rule out quad tendon tear, ACL/PCL injury, meniscal tear.  I was not able to perform an adequate exam of patient's knee today due to her pain level and not wanting me to touch her leg.  Also will get stat venous Doppler left lower extremity to rule out DVT.  Patient going for both studies today.  I will await callback report for the venous Doppler.  Continue knee immobilizer.  Patient did ask for narcotic medication advised her that I will hold off on that for now.  She was given samples of Duexis 1 tab p.o. 3 times daily as needed  Follow-Up Instructions: Return in about 1 week (around 04/10/2018) for With Jeneen Rinks.   Orders:  Orders Placed This Encounter  Procedures  . MR Knee Left w/o contrast   No orders of the defined types were placed in this encounter.     Procedures: No procedures performed   Clinical Data: No additional findings.   Subjective: Chief Complaint  Patient presents with  . Left Knee - Pain    HPI 33 year old white female comes in today evaluation of left knee pain.  Patient was seen in the emergency room April 01, 2018 due to having left knee pain and states that she had a couple of episodes of her knee buckling.  Patient has a Education officer, museum that helps her at home and had advised her to go to the emergency room for evaluation. Patient had x-ray of the left knee and report showed:  CLINICAL DATA:  Patient complains of left knee pain and states she hyperextended the left knee pain x 1 month  and worsening x the past 2-3 days.  EXAM: LEFT KNEE - COMPLETE 4+ VIEW  COMPARISON:  None.  FINDINGS: There is no acute fracture. The knee appears hyperextended. There is irregularity and thickening of the distal quadriceps tendon superior to the patella, raising the question of injury of the quadriceps tendon. No joint effusion is present.  IMPRESSION: Question of injury of the quadriceps tendon.  No joint effusion or fracture.  Consider further evaluation with MRI as needed.  Patient was diagnosed with a possible quadriceps tendon tear and placed in a knee immobilizer and instructed to follow-up in our office for evaluation.  After questioning patient states that at Christmas time she was at a party dancing when she dropped down and did a "split".  States that immediately after doing that she did have knee pain but definitely not to this extent up until a few days ago.  States that she has been unable to bend her knee times a few days.   Review of Systems No current cardiac pulmonary GI GU issues  Objective: Vital Signs: There were no vitals taken for this visit.  Physical Exam HENT:     Head: Normocephalic and atraumatic.  Pulmonary:     Effort: Pulmonary effort  is normal.  Musculoskeletal:     Comments: Patient is sitting in wheelchair in examining room.  Negative logroll bilateral hips.  Left knee she does have some swelling without large effusion.  Knee is diffusely tender and upon trying to palpate quad tendon patient does not allow me to do this due to her pain.  Unable to determine as to whether or not she indeed has a quad tendon defect.  I am unable to assess cruciate and collateral ligament stability.  Marked tenderness over the medial lateral joint line with light palpation.  Patient kept her knee in full extension during the entire visit so I really cannot tell whether or not her extensor mechanism is actually intact.  Patient does have left calf swelling.   Left calf is markedly tender.  Skin:    General: Skin is warm and dry.  Neurological:     General: No focal deficit present.     Mental Status: She is alert.     Ortho Exam  Specialty Comments:  No specialty comments available.  Imaging: No results found.   PMFS History: Patient Active Problem List   Diagnosis Date Noted  . Biliary colic 93/71/6967  . Tobacco use disorder 09/01/2015  . Cannabis use disorder, moderate, dependence ([place]) 09/01/2015  . OSA (obstructive sleep apnea) 10/07/2013  . Upper airway cough syndrome 09/03/2013  . Essential hypertension 09/03/2013  . Schizoaffective disorder, depressive type ([place]) 02/22/2011   Past Medical History:  Diagnosis Date  . Anxiety   . Asthma   . Brain tumor ([place])   . Cancer ([place]) 1991   ovarian  . Depression   . 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 Hypertension   . Kidney stone   . Kidney stones   . Ovarian cancer ([place])   . Schizoaffective disorder   . Seizures ([place])     Family History  Problem Relation Age of Onset  . Diabetes type II Other   . Asthma Mother     Past Surgical History:  Procedure Laterality Date  . ABDOMINAL HYSTERECTOMY     partial; took fallopian tubes and ovaries  . APPENDECTOMY    . CHOLECYSTECTOMY N/A 07/09/2017   Procedure: LAPAROSCOPIC CHOLECYSTECTOMY;  Surgeon: Clovis Riley, MD;  Location: [facility];  Service: General;  Laterality: N/A;  . kidney stone removal     Social History   Occupational History  . Occupation: Ship broker   Tobacco Use  . Smoking status: Light Tobacco Smoker    Packs/day: 0.15    Years: 2.00    Pack years: 0.30    Types: Cigarettes    Last attempt to quit: 07/27/2012    Years since quitting: 5.6  . Smokeless tobacco: Never Used  Substance and Sexual Activity  . Alcohol use: No  . Drug use: Yes    Frequency: 7.0 times per week    Types: Marijuana  . Sexual activity: Yes    Birth control/protection: Other-see comments    Comment: pt  states "i can't have kids"     Addendum: I did get a callback report from the vascular lab and they stated that venous Doppler was negative for DVT left lower extremity.  Tech stated that they are taking patient directly over for her left knee MRI right now.

## 2018-04-03 NOTE — ED Provider Notes (Signed)
[place] EMERGENCY DEPARTMENT Provider Note   CSN: [id number] Arrival date & time: 04/03/18  1821     History   Chief Complaint Chief Complaint  Patient presents with  . Leg Pain    HPI [name] is a 33 y.o. female.  The history is provided by the patient and medical records. No language interpreter was used.  Leg Pain  Associated symptoms: no fever      33 year old obese female with history of anxiety, schizoaffective disorder, ovarian cancer presenting for evaluation of knee pain.  Patient was seen in the ED 2 days ago with complaints of left knee discomfort.  It started after she hyperextended approximately a month ago and now every time she walks she endorsed having pain.  She is now dragging her foot because of the discomfort.  She does not complain of any numbness.  Her ER visit includes DVT study, x-ray of her left ankle, x-ray of the left knee as well as head CT scan.  X-ray of her left knee is question of injury of the quadricep tendons with recommendation for MRI for further evaluation.  Patient was discharged to follow-up outpatient for MRI.  She was brought here due to having anxiety from claustrophobia while trying to have an MRI outpatient.  At this time patient endorsed increasing pain to her left knee after prolonged sitting.  She also endorsed feeling anxious and claustrophobic about the MRI.  She denies any numbness or any fever. She mentioned currently waiting for medication so she can get the MRI outpt after this.   Past Medical History:  Diagnosis Date  . Anxiety   . Asthma   . Brain tumor ([place])   . Cancer ([place]) 1991   ovarian  . Depression   . 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 Hypertension   . Kidney stone   . Kidney stones   . [place]ian cancer ([place])   . Schizoaffective disorder   . Seizures [facility])     Patient Active Problem List   Diagnosis Date Noted  . Biliary colic 32/95/1884  . Tobacco use  disorder 09/01/2015  . Cannabis use disorder, moderate, dependence ([place]) 09/01/2015  . OSA (obstructive sleep apnea) 10/07/2013  . Upper airway cough syndrome 09/03/2013  . Essential hypertension 09/03/2013  . Schizoaffective disorder, depressive type ([place]) 02/22/2011    Past Surgical History:  Procedure Laterality Date  . ABDOMINAL HYSTERECTOMY     partial; took fallopian tubes and ovaries  . APPENDECTOMY    . CHOLECYSTECTOMY N/A 07/09/2017   Procedure: LAPAROSCOPIC CHOLECYSTECTOMY;  Surgeon: Clovis Riley, MD;  Location: [facility];  Service: General;  Laterality: N/A;  . kidney stone removal       OB History    Gravida  0   Para  0   Term  0   Preterm  0   AB  0   Living  0     SAB  0   TAB  0   Ectopic  0   Multiple  0   Live Births               Home Medications    Prior to Admission medications   Medication Sig Start Date End Date Taking? Authorizing Provider  hydrochlorothiazide (HYDRODIURIL) 25 MG tablet Take 1 tablet (25 mg total) by mouth daily. 09/02/15   Pucilowska, Jolanta B, MD  oxyCODONE-acetaminophen (PERCOCET) 5-325 MG tablet Take 1 tablet by mouth every 6 (  six) hours as needed for severe pain. 04/01/18   Mesner, Corene Cornea, MD    Family History Family History  Problem Relation Age of Onset  . Diabetes type II Other   . Asthma Mother     Social History Social History   Tobacco Use  . Smoking status: Light Tobacco Smoker    Packs/day: 0.15    Years: 2.00    Pack years: 0.30    Types: Cigarettes    Last attempt to quit: 07/27/2012    Years since quitting: 5.6  . Smokeless tobacco: Never Used  Substance Use Topics  . Alcohol use: No  . Drug use: Yes    Frequency: 7.0 times per week    Types: Marijuana     Allergies   Ketorolac tromethamine; Latex; Lortab [hydrocodone-acetaminophen]; and Tramadol   Review of Systems Review of Systems  Constitutional: Negative for fever.  Musculoskeletal: Positive for arthralgias.    Neurological: Negative for numbness.     Physical Exam Updated Vital Signs BP (!) 139/112 (BP Location: Right Arm)   Pulse 70   Temp 98.8 F (37.1 C) (Oral)   Resp 18   SpO2 100%   Physical Exam Vitals signs and nursing note reviewed.  Constitutional:      General: She is not in acute distress.    Appearance: She is well-developed. She is obese.  HENT:     Head: Atraumatic.  Eyes:     Conjunctiva/sclera: Conjunctivae normal.  Neck:     Musculoskeletal: Neck supple.  Musculoskeletal:        General: Tenderness (Left knee: Patient is wearing knee brace, diffuse tenderness about the knee with gentle palpation.  Patient does not want to flex or extend the knee secondary to pain.) present.  Skin:    Findings: No rash.  Neurological:     Mental Status: She is alert.      ED Treatments / Results  Labs (all labs ordered are listed, but only abnormal results are displayed) Labs Reviewed - No data to display  EKG None  Radiology Dg Ankle Complete Left  Result Date: 04/01/2018 CLINICAL DATA:  Left knee and ankle pain for the past month following a hyperextension injury, worse over the past 2-3 days. EXAM: LEFT ANKLE COMPLETE - 3+ VIEW COMPARISON:  None. FINDINGS: Diffuse soft tissue swelling. No fracture, dislocation or effusion seen. Mild posterior calcaneal spur formation and mild dorsal tarsal spur formation. IMPRESSION: 1. Diffuse soft tissue swelling without fracture or dislocation. 2. Mild degenerative spur formation. Electronically Signed   By: Claudie Revering M.D.   On: 04/01/2018 20:14   Ct Head Wo Contrast  Result Date: 04/01/2018 CLINICAL DATA:  Left arm numbness last week and her blood pressure was high. EXAM: CT HEAD WITHOUT CONTRAST TECHNIQUE: Contiguous axial images were obtained from the base of the skull through the vertex without intravenous contrast. COMPARISON:  11/04/2015. FINDINGS: Brain: Normal appearing cerebral hemispheres and posterior fossa structures.  Normal size and position of the ventricles. No intracranial hemorrhage, mass lesion or CT evidence of acute infarction. Vascular: No hyperdense vessel or unexpected calcification. Skull: Diffuse hyperostosis, most pronounced in the frontal regions. Sinuses/Orbits: Unremarkable. Other: None. IMPRESSION: No acute abnormality. Electronically Signed   By: Claudie Revering M.D.   On: 04/01/2018 20:10   Vas [place] Lower Extremity Venous (dvt)  Result Date: 04/03/2018  Lower Venous Study Indications: Pain, and Swelling.  Risk Factors: Trauma Fall. Performing Technologist: Toma Copier RVS  Examination Guidelines: A complete evaluation includes B-mode  imaging, spectral Doppler, color Doppler, and power Doppler as needed of all accessible portions of each vessel. Bilateral testing is considered an integral part of a complete examination. Limited examinations for reoccurring indications may be performed as noted.  Right Venous Findings: +---+---------------+---------+-----------+----------+-------+    CompressibilityPhasicitySpontaneityPropertiesSummary +---+---------------+---------+-----------+----------+-------+ CFVFull           Yes      Yes                          +---+---------------+---------+-----------+----------+-------+ SFJFull                                                 +---+---------------+---------+-----------+----------+-------+  Left Venous Findings: +---------+---------------+---------+-----------+----------+-------+          CompressibilityPhasicitySpontaneityPropertiesSummary +---------+---------------+---------+-----------+----------+-------+ CFV      Full           Yes      Yes                          +---------+---------------+---------+-----------+----------+-------+ SFJ      Full                                                 +---------+---------------+---------+-----------+----------+-------+ FV Prox  Full           Yes      Yes                           +---------+---------------+---------+-----------+----------+-------+ FV Mid   Full                                                 +---------+---------------+---------+-----------+----------+-------+ FV DistalFull           Yes      Yes                          +---------+---------------+---------+-----------+----------+-------+ PFV      Full           Yes      Yes                          +---------+---------------+---------+-----------+----------+-------+ POP      Full           Yes      Yes                          +---------+---------------+---------+-----------+----------+-------+ PTV      Full                                                 +---------+---------------+---------+-----------+----------+-------+ PERO     Full                                                 +---------+---------------+---------+-----------+----------+-------+  Summary: Right: There is no evidenceof a common femoral vein obstruction Left: There is no evidence of deep vein thrombosis in the lower extremity. No cystic structure found in the popliteal fossa.  *See table(s) above for measurements and observations. Electronically signed by Servando Snare MD on 04/03/2018 at 5:57:48 PM.    Final     Procedures Procedures (including critical care time)  Medications Ordered in ED Medications  HYDROmorphone (DILAUDID) injection 1 mg (1 mg Intravenous Given 04/03/18 1959)  LORazepam (ATIVAN) injection 1 mg (1 mg Intravenous Given 04/03/18 1959)  ibuprofen (ADVIL,MOTRIN) tablet 800 mg (800 mg Oral Given 04/03/18 2156)     Initial Impression / Assessment and Plan / ED Course  I have reviewed the triage vital signs and the nursing notes.  Pertinent labs & imaging results that were available during my care of the patient were reviewed by me and considered in my medical decision making (see chart for details).     BP (!) 144/83   Pulse 71   Temp 98.8 F (37.1 C) (Oral)   Resp 18    SpO2 100%    Final Clinical Impressions(s) / ED Diagnoses   Final diagnoses:  Left knee pain, unspecified chronicity    ED Discharge Orders         Ordered    ibuprofen (ADVIL,MOTRIN) 800 MG tablet  3 times daily     04/03/18 2141         7:07 PM Patient complaining of pain claustrophobic to MRI for her left knee pain.  Plan to give patient pain medication and antianxiety medication and patient can follow-up outpatient to have it performed.  MRI of L knee unremarkable.  Encourage pt to f/u with ortho for further care.    Domenic Moras, PA-C 04/03/18 2328    Lennice Sites, DO 04/04/18 [phone]

## 2018-04-03 NOTE — ED Notes (Signed)
Patient Alert and oriented to baseline. Stable and ambulatory to baseline. Patient verbalized understanding of the discharge instructions.  Patient belongings were taken by the patient.   

## 2018-04-03 NOTE — Telephone Encounter (Signed)
Pt called about  

## 2018-04-03 NOTE — ED Notes (Signed)
Patient transported to MRI 

## 2018-04-03 NOTE — Discharge Instructions (Addendum)
MRI if your left knee did not show any abnormality.  Please call and follow up with orthopedist for further care.

## 2018-04-03 NOTE — Progress Notes (Signed)
Left lower extremity venous duplex completed. Preliminary results in chart review CV proc. Verbal report given to [place], [place] 04/03/2018 4:50 PM

## 2018-04-03 NOTE — ED Triage Notes (Signed)
Pt brought from MRI by swot. Pt was in MRI as outpatient for left knee pain. MRI called swot to medicate patient for claustrophobia but pt could not get medication so she was brought to the ED to be medicated for MRI. NAD. Has knee immobilizer on left leg.

## 2018-05-23 IMAGING — CT CT HEAD W/O CM
3 series · 16 of 47 positions shown, 19 images · non-contrast
Comparison: Prior CT from 03/20/2011.

CLINICAL DATA: Initial evaluation for acute psychiatric illness.
History of schizoaffective disorder.

EXAM:
CT HEAD WITHOUT CONTRAST
TECHNIQUE: Contiguous axial images were obtained from the base of the skull
through the vertex without intravenous contrast.

[Series 2: head wo · axial · 0.44mm/px · z∈[-162,-38]mm · 10 of 30 slices shown, 13 images]
[im 3/30  brain]
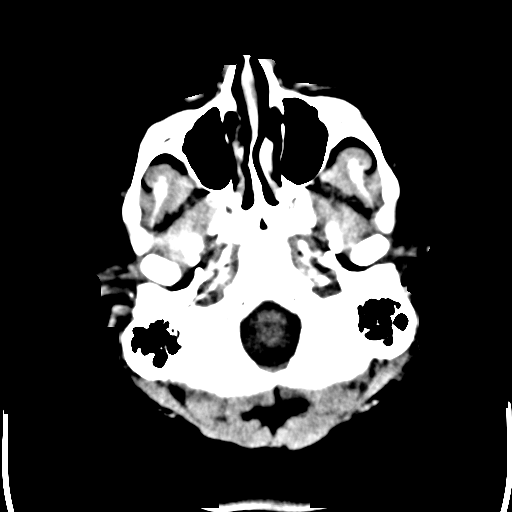
[im 3/30  bone]
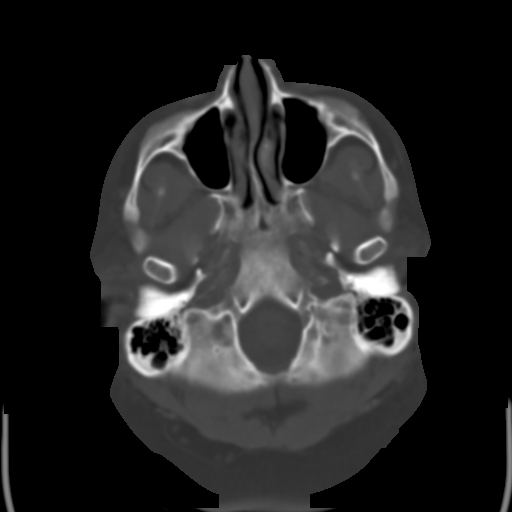
[im 6/30  brain]
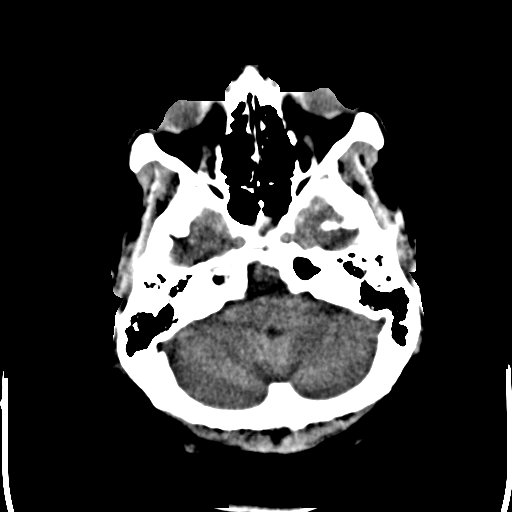
[im 9/30  brain]
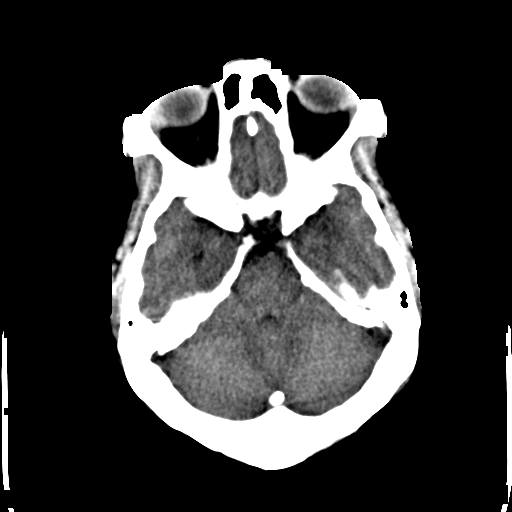
[im 11/30  brain]
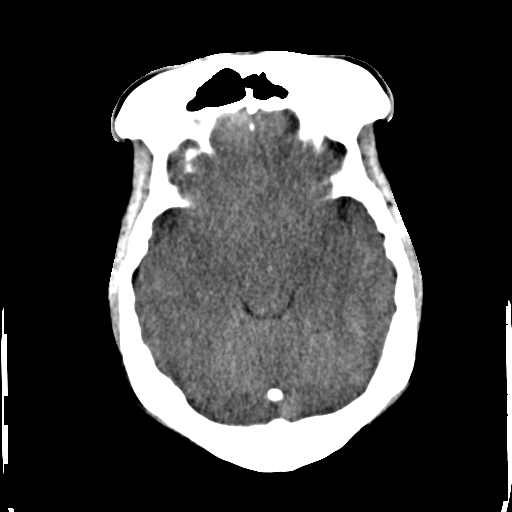
[im 14/30  brain]
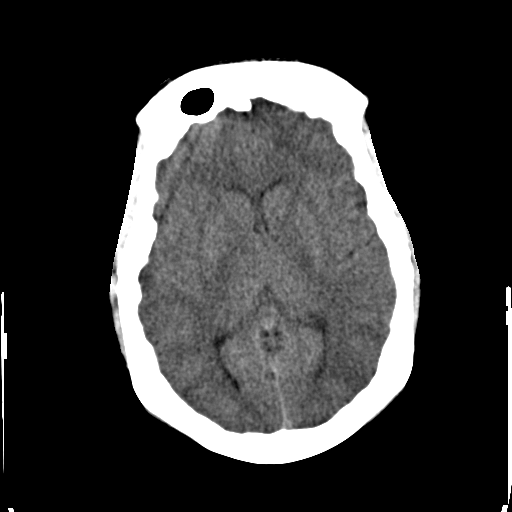
[im 14/30  bone]
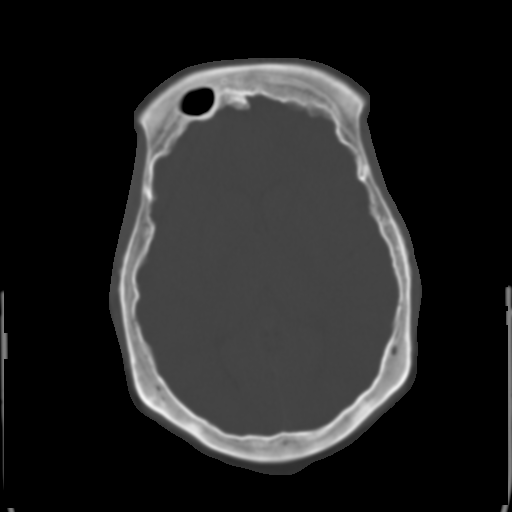
[im 17/30  brain]
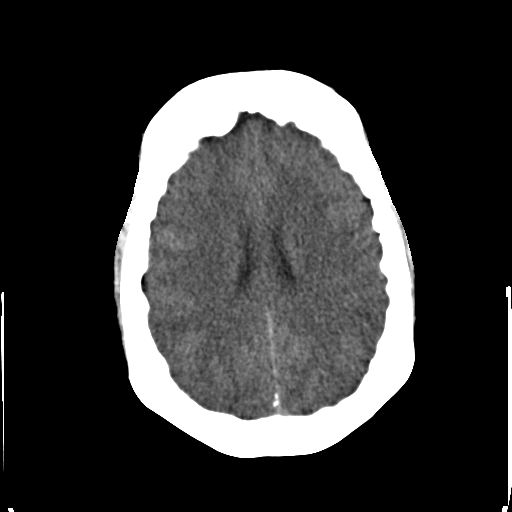
[im 20/30  brain]
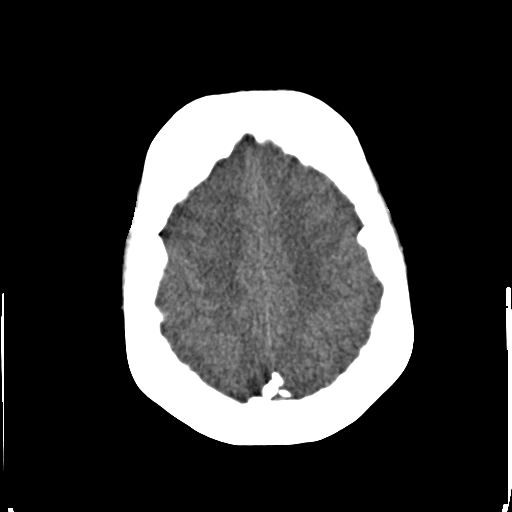
[im 23/30  brain]
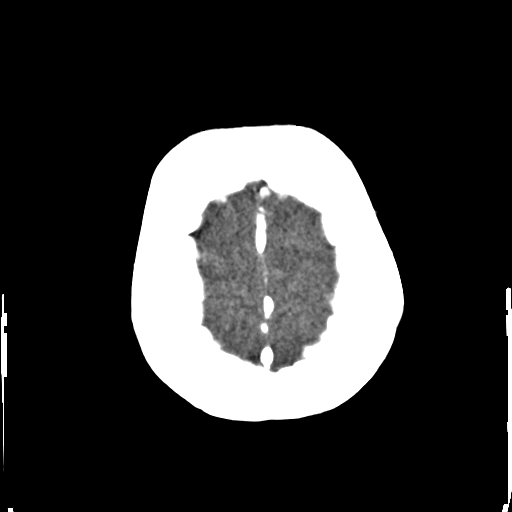
[im 25/30  brain]
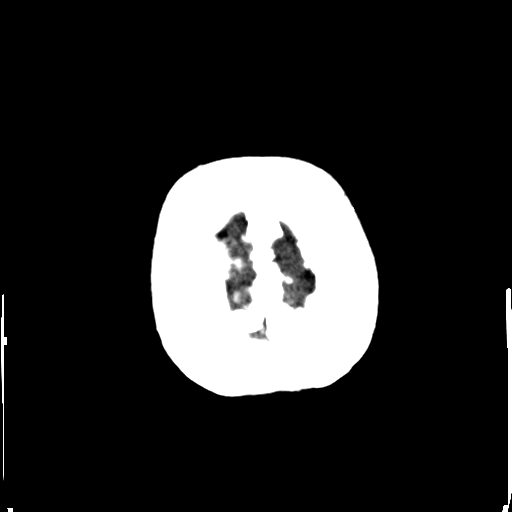
[im 25/30  bone]
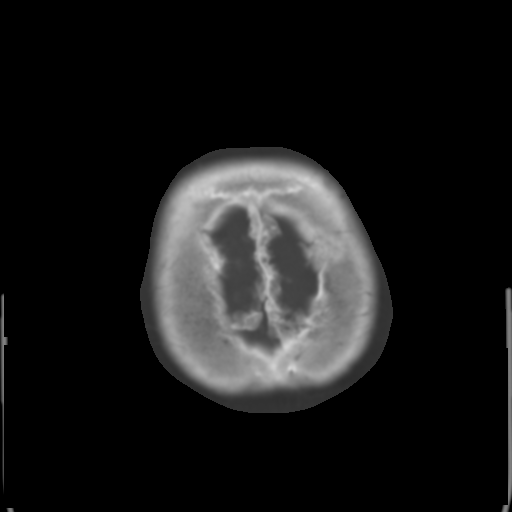
[im 28/30  brain]
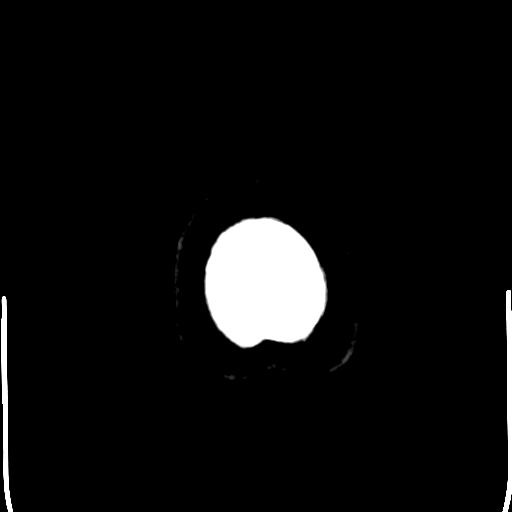

[Series 4: coronal soft tissue · coronal · 0.31mm/px · 3 of 62 slices shown]
[im 21/62  brain]
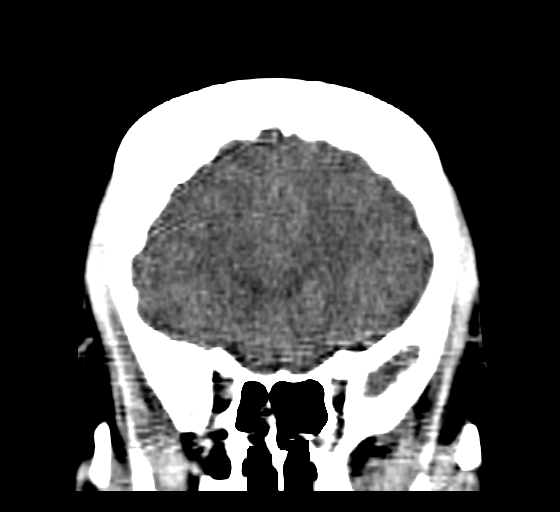
[im 28/62  brain]
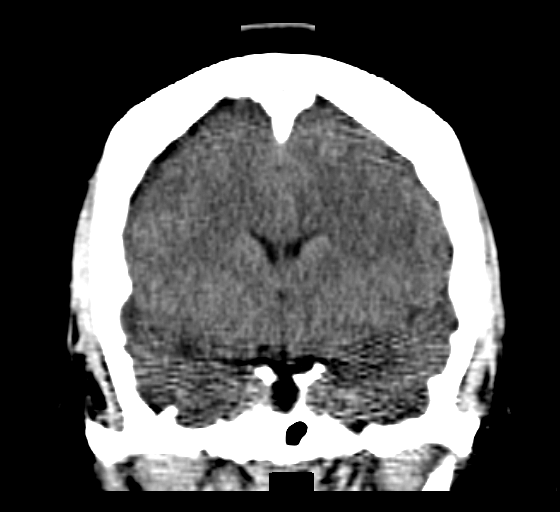
[im 34/62  brain]
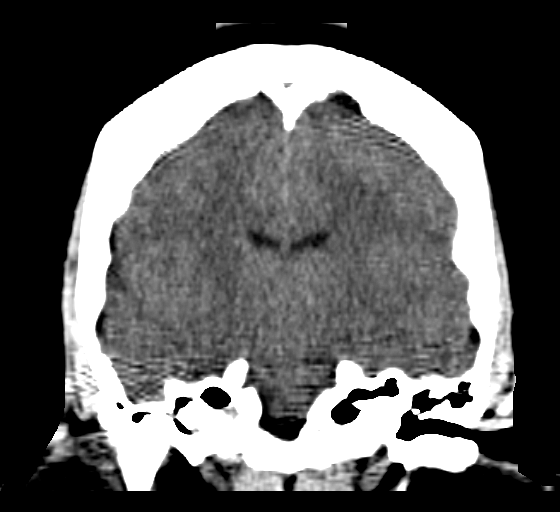

[Series 5: sagittal soft tissue · sagittal · 0.32mm/px · 3 of 50 slices shown]
[im 17/50  brain]
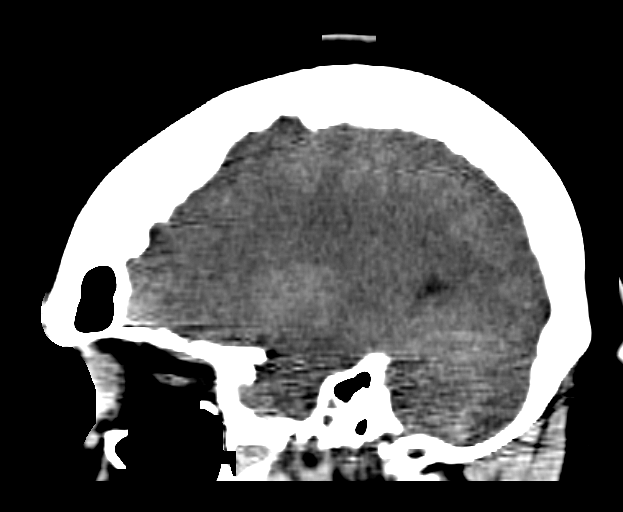
[im 25/50  brain]
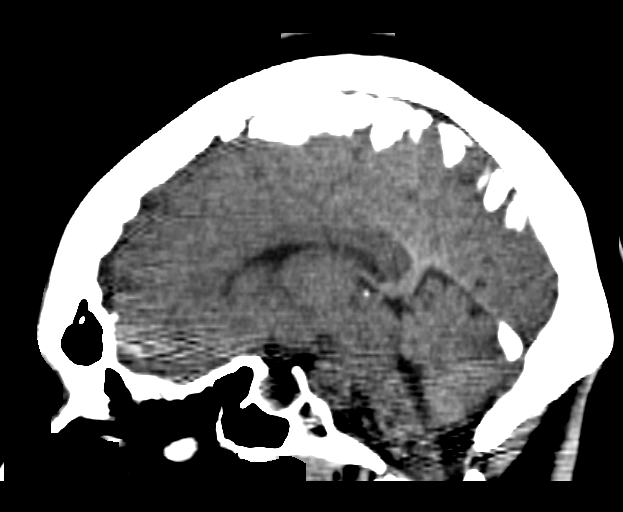
[im 33/50  brain]
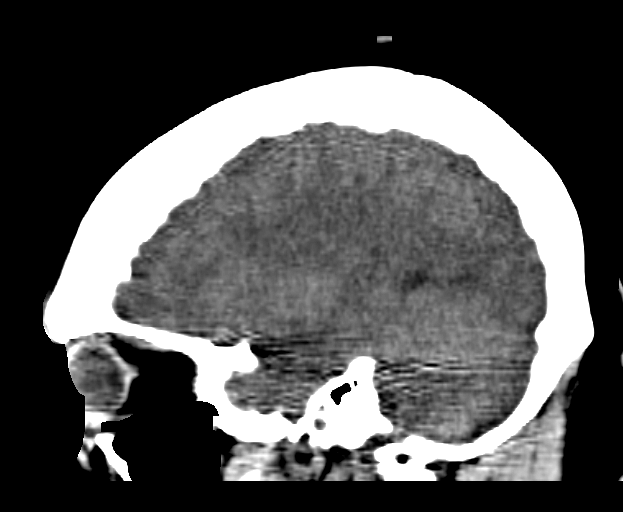

[16 of 47 positions shown; findings below may reference images not displayed]

FINDINGS: There is no acute intracranial hemorrhage or infarct. No mass lesion
or midline shift. Gray-white matter differentiation is well
maintained. Ventricles are normal in size without evidence of
hydrocephalus. CSF containing spaces are within normal limits. No
extra-axial fluid collection.

Calvarium is prominent with thickening of the diploic space, stable.
Calvarium intact.

Orbital soft tissues are within normal limits.

The paranasal sinuses and mastoid air cells are well pneumatized and
free of fluid.

Scalp soft tissues are unremarkable.
IMPRESSION: Stable normal appearance of the brain. No acute intracranial process
identified.

## 2018-12-31 ENCOUNTER — Other Ambulatory Visit: Payer: Self-pay

## 2018-12-31 DIAGNOSIS — J020 Streptococcal pharyngitis: Secondary | ICD-10-CM | POA: Diagnosis not present

## 2018-12-31 DIAGNOSIS — J45909 Unspecified asthma, uncomplicated: Secondary | ICD-10-CM | POA: Insufficient documentation

## 2018-12-31 DIAGNOSIS — F17210 Nicotine dependence, cigarettes, uncomplicated: Secondary | ICD-10-CM | POA: Diagnosis not present

## 2018-12-31 DIAGNOSIS — Z91040 Latex allergy status: Secondary | ICD-10-CM | POA: Insufficient documentation

## 2018-12-31 DIAGNOSIS — Z8543 Personal history of malignant neoplasm of ovary: Secondary | ICD-10-CM | POA: Diagnosis not present

## 2018-12-31 DIAGNOSIS — R070 Pain in throat: Secondary | ICD-10-CM | POA: Diagnosis present

## 2018-12-31 DIAGNOSIS — Z79899 Other long term (current) drug therapy: Secondary | ICD-10-CM | POA: Diagnosis not present

## 2018-12-31 DIAGNOSIS — I10 Essential (primary) hypertension: Secondary | ICD-10-CM | POA: Insufficient documentation

## 2019-01-01 ENCOUNTER — Emergency Department (HOSPITAL_COMMUNITY)
Admission: EM | Admit: 2019-01-01 | Discharge: 2019-01-01 | Disposition: A | Discharge status: Home / Self Care | Payer: Medicaid Other | Attending: Emergency Medicine | Admitting: Emergency Medicine

## 2019-01-01 ENCOUNTER — Other Ambulatory Visit: Payer: Self-pay

## 2019-01-01 ENCOUNTER — Encounter (HOSPITAL_COMMUNITY): Payer: Self-pay | Admitting: *Deleted

## 2019-01-01 DIAGNOSIS — J020 Streptococcal pharyngitis: Secondary | ICD-10-CM

## 2019-01-01 LAB — GROUP A STREP BY PCR: Group A Strep by PCR: DETECTED — AB

## 2019-01-01 MED ORDER — PENICILLIN G BENZATHINE 1200000 UNIT/2ML IM SUSP
1.2000 10*6.[IU] | Freq: Once | INTRAMUSCULAR | Status: AC
  Administered 2019-01-01: 1.2 10*6.[IU] via INTRAMUSCULAR
  Filled 2019-01-01: qty 2

## 2019-01-01 MED ORDER — ACETAMINOPHEN 500 MG PO TABS
1000.0000 mg | ORAL_TABLET | Freq: Once | ORAL | Status: AC
  Administered 2019-01-01: 1000 mg via ORAL
  Filled 2019-01-01: qty 2

## 2019-01-01 MED ORDER — LIDOCAINE VISCOUS HCL 2 % MT SOLN
15.0000 mL | Freq: Once | OROMUCOSAL | Status: AC
  Administered 2019-01-01: 15 mL via OROMUCOSAL
  Filled 2019-01-01: qty 15

## 2019-01-01 NOTE — ED Provider Notes (Signed)
[place] DEPT Provider Note   CSN: [id number] Arrival date & time: 12/31/18  2358     History   Chief Complaint Chief Complaint  Patient presents with  . Sore Throat    HPI [name] is a 33 y.o. female.     Patient presents to the emergency department with chief complaint of sore throat.  She reports gradually worsening sore throat over the past 2 days.  Denies any fevers at home.  Denies any productive cough.  Denies any successful treatments prior to arrival.  Reports increased pain with swallowing.  The history is provided by the patient. No language interpreter was used.    Past Medical History:  Diagnosis Date  . Anxiety   . Asthma   . Brain tumor ([place])   . Cancer ([place]) 1991   ovarian  . Depression   . 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)   . Hypertension   . Kidney stone   . Kidney stones   . Ovarian cancer ([place])   . Schizoaffective disorder   . Seizures [facility])     Patient Active Problem List   Diagnosis Date Noted  . Biliary colic 99991111  . Tobacco use disorder 09/01/2015  . Cannabis use disorder, moderate, dependence ([place]) 09/01/2015  . OSA (obstructive sleep apnea) 10/07/2013  . Upper airway cough syndrome 09/03/2013  . Essential hypertension 09/03/2013  . Schizoaffective disorder, depressive type ([place]) 02/22/2011    Past Surgical History:  Procedure Laterality Date  . ABDOMINAL HYSTERECTOMY     partial; took fallopian tubes and ovaries  . APPENDECTOMY    . CHOLECYSTECTOMY N/A 07/09/2017   Procedure: LAPAROSCOPIC CHOLECYSTECTOMY;  Surgeon: Clovis Riley, MD;  Location: [facility];  Service: General;  Laterality: N/A;  . kidney stone removal       OB History    Gravida  0   Para  0   Term  0   Preterm  0   AB  0   Living  0     SAB  0   TAB  0   Ectopic  0   Multiple  0   Live Births               Home Medications    Prior to Admission medications    Medication Sig Start Date End Date Taking? Authorizing Provider  hydrochlorothiazide (HYDRODIURIL) 25 MG tablet Take 1 tablet (25 mg total) by mouth daily. 09/02/15   Pucilowska, Jolanta B, MD  ibuprofen (ADVIL,MOTRIN) 800 MG tablet Take 1 tablet (800 mg total) by mouth 3 (three) times daily. 04/03/18   Domenic Moras, PA-C  oxyCODONE-acetaminophen (PERCOCET) 5-325 MG tablet Take 1 tablet by mouth every 6 (six) hours as needed for severe pain. 04/01/18   Mesner, Corene Cornea, MD    Family History Family History  Problem Relation Age of Onset  . Diabetes type II Other   . Asthma Mother     Social History Social History   Tobacco Use  . Smoking status: Light Tobacco Smoker    Packs/day: 0.15    Years: 2.00    Pack years: 0.30    Types: Cigarettes    Last attempt to quit: 07/27/2012    Years since quitting: 6.4  . Smokeless tobacco: Never Used  Substance Use Topics  . Alcohol use: No  . Drug use: Yes    Frequency: 7.0 times per week    Types: Marijuana     Allergies  Ketorolac tromethamine, Latex, Lortab [hydrocodone-acetaminophen], and Tramadol   Review of Systems Review of Systems  Constitutional: Negative for chills and fever.  HENT: Positive for sore throat. Negative for drooling and postnasal drip.   Respiratory: Negative for cough and shortness of breath.   Gastrointestinal: Negative for constipation, diarrhea, nausea and vomiting.     Physical Exam Updated Vital Signs BP 125/76 (BP Location: Left Arm)   Pulse 96   Temp 98.2 F (36.8 C) (Oral)   Resp 17   Ht 5\' 5"  (1.651 m)   Wt 122.5 kg   SpO2 98%   BMI 44.93 kg/m   Physical Exam Vitals signs and nursing note reviewed.  Constitutional:      General: She is not in acute distress.    Appearance: She is well-developed.  HENT:     Head: Normocephalic and atraumatic.     Mouth/Throat:     Comments: Oropharynx is erythematous with tonsillar exudate, no evidence of peritonsillar abscess, uvula is midline, airway  intact, no stridor, normal phonation Eyes:     Conjunctiva/sclera: Conjunctivae normal.  Neck:     Musculoskeletal: Neck supple.  Cardiovascular:     Rate and Rhythm: Normal rate.     Heart sounds: No murmur.  Pulmonary:     Effort: Pulmonary effort is normal. No respiratory distress.  Abdominal:     General: There is no distension.  Musculoskeletal:     Comments: Moves all extremities  Skin:    General: Skin is warm and dry.  Neurological:     Mental Status: She is alert and oriented to person, place, and time.  Psychiatric:        Mood and Affect: Mood normal.        Behavior: Behavior normal.      ED Treatments / Results  Labs (all labs ordered are listed, but only abnormal results are displayed) Labs Reviewed  GROUP A STREP BY PCR - Abnormal; Notable for the following components:      Result Value   Group A Strep by PCR DETECTED (*)    All other components within normal limits    EKG None  Radiology No results found.  Procedures Procedures (including critical care time)  Medications Ordered in ED Medications  penicillin g benzathine (BICILLIN LA) 1200000 UNIT/2ML injection 1.2 Million Units (has no administration in time range)  lidocaine (XYLOCAINE) 2 % viscous mouth solution 15 mL (has no administration in time range)  acetaminophen (TYLENOL) tablet 1,000 mg (has no administration in time range)     Initial Impression / Assessment and Plan / ED Course  I have reviewed the triage vital signs and the nursing notes.  Pertinent labs & imaging results that were available during my care of the patient were reviewed by me and considered in my medical decision making (see chart for details).        Patient with strep throat.  Treated with penicillin IM.  Vital signs are stable.  No evidence of abscess.  Final Clinical Impressions(s) / ED Diagnoses   Final diagnoses:  Strep pharyngitis    ED Discharge Orders    None       Montine Circle, PA-C  01/01/19 0122    Palumbo, April, MD 01/01/19 0124

## 2019-01-01 NOTE — ED Triage Notes (Signed)
Pt c/o sore throat since Saturday. Pt thinks she has also had a fever. No meds PTA.

## 2019-01-01 NOTE — ED Triage Notes (Signed)
Pt arrives from home via [facility]. They were called out for sore throat and vomiting, white patches on her tonsils. Ongoing since Saturday. 155/95, hr 103, O2 99%.

## 2019-06-17 ENCOUNTER — Emergency Department (HOSPITAL_COMMUNITY): Payer: Medicaid Other

## 2019-06-17 ENCOUNTER — Encounter (HOSPITAL_COMMUNITY): Payer: Self-pay | Admitting: Emergency Medicine

## 2019-06-17 ENCOUNTER — Emergency Department (HOSPITAL_COMMUNITY)
Admission: EM | Admit: 2019-06-17 | Discharge: 2019-06-17 | Disposition: A | Discharge status: Home / Self Care | Payer: Medicaid Other | Attending: Emergency Medicine | Admitting: Emergency Medicine

## 2019-06-17 DIAGNOSIS — Z8543 Personal history of malignant neoplasm of ovary: Secondary | ICD-10-CM | POA: Diagnosis not present

## 2019-06-17 DIAGNOSIS — Z87442 Personal history of urinary calculi: Secondary | ICD-10-CM | POA: Diagnosis not present

## 2019-06-17 DIAGNOSIS — R197 Diarrhea, unspecified: Secondary | ICD-10-CM | POA: Insufficient documentation

## 2019-06-17 DIAGNOSIS — N10 Acute tubulo-interstitial nephritis: Secondary | ICD-10-CM | POA: Diagnosis not present

## 2019-06-17 DIAGNOSIS — N12 Tubulo-interstitial nephritis, not specified as acute or chronic: Secondary | ICD-10-CM

## 2019-06-17 DIAGNOSIS — R1032 Left lower quadrant pain: Secondary | ICD-10-CM | POA: Insufficient documentation

## 2019-06-17 DIAGNOSIS — I10 Essential (primary) hypertension: Secondary | ICD-10-CM | POA: Insufficient documentation

## 2019-06-17 DIAGNOSIS — Z79899 Other long term (current) drug therapy: Secondary | ICD-10-CM | POA: Diagnosis not present

## 2019-06-17 DIAGNOSIS — R112 Nausea with vomiting, unspecified: Secondary | ICD-10-CM | POA: Diagnosis not present

## 2019-06-17 DIAGNOSIS — F17210 Nicotine dependence, cigarettes, uncomplicated: Secondary | ICD-10-CM | POA: Insufficient documentation

## 2019-06-17 LAB — URINALYSIS, ROUTINE W REFLEX MICROSCOPIC
Bilirubin Urine: NEGATIVE
Glucose, UA: NEGATIVE mg/dL
Hgb urine dipstick: NEGATIVE
Ketones, ur: NEGATIVE mg/dL
Nitrite: NEGATIVE
Protein, ur: 30 mg/dL — AB
Specific Gravity, Urine: 1.023 (ref 1.005–1.030)
WBC, UA: >50 WBC/hpf — ABNORMAL HIGH (ref 0–5)
pH: 6 (ref 5.0–8.0)

## 2019-06-17 LAB — COMPREHENSIVE METABOLIC PANEL
ALT: 18 U/L (ref 0–44)
AST: 14 U/L — ABNORMAL LOW (ref 15–41)
Albumin: 3.9 g/dL (ref 3.5–5.0)
Alkaline Phosphatase: 80 U/L (ref 38–126)
Anion gap: 10 (ref 5–15)
BUN: 13 mg/dL (ref 6–20)
CO2: 24 mmol/L (ref 22–32)
Calcium: 8.9 mg/dL (ref 8.9–10.3)
Chloride: 104 mmol/L (ref 98–111)
Creatinine, Ser: 0.8 mg/dL (ref 0.44–1.00)
GFR calc Af Amer: >60 mL/min (ref >60)
GFR calc non Af Amer: >60 mL/min (ref >60)
Glucose, Bld: 94 mg/dL (ref 70–99)
Potassium: 3.6 mmol/L (ref 3.5–5.1)
Sodium: 138 mmol/L (ref 135–145)
Total Bilirubin: 0.5 mg/dL (ref 0.3–1.2)
Total Protein: 7.4 g/dL (ref 6.5–8.1)

## 2019-06-17 LAB — CBC
HCT: 37.6 % (ref 36.0–46.0)
Hemoglobin: 12.4 g/dL (ref 12.0–15.0)
MCH: 32.3 pg (ref 26.0–34.0)
MCHC: 33 g/dL (ref 30.0–36.0)
MCV: 97.9 fL (ref 80.0–100.0)
Platelets: 311 10*3/uL (ref 150–400)
RBC: 3.84 MIL/uL — ABNORMAL LOW (ref 3.87–5.11)
RDW: 12.3 % (ref 11.5–15.5)
WBC: 14.1 10*3/uL — ABNORMAL HIGH (ref 4.0–10.5)
nRBC: 0 % (ref 0.0–0.2)

## 2019-06-17 LAB — DIFFERENTIAL
Abs Immature Granulocytes: 0.04 10*3/uL (ref 0.00–0.07)
Basophils Absolute: 0 10*3/uL (ref 0.0–0.1)
Basophils Relative: 0 %
Eosinophils Absolute: 0 10*3/uL (ref 0.0–0.5)
Eosinophils Relative: 0 %
Immature Granulocytes: 0 %
Lymphocytes Relative: 12 %
Lymphs Abs: 1.6 10*3/uL (ref 0.7–4.0)
Monocytes Absolute: 0.4 10*3/uL (ref 0.1–1.0)
Monocytes Relative: 3 %
Neutro Abs: 11.5 10*3/uL — ABNORMAL HIGH (ref 1.7–7.7)
Neutrophils Relative %: 85 %

## 2019-06-17 LAB — LACTIC ACID, PLASMA: Lactic Acid, Venous: 1 mmol/L (ref 0.5–1.9)

## 2019-06-17 LAB — POC URINE PREG, ED: Preg Test, Ur: NEGATIVE

## 2019-06-17 LAB — LIPASE, BLOOD: Lipase: 26 U/L (ref 11–51)

## 2019-06-17 MED ORDER — MORPHINE SULFATE (PF) 4 MG/ML IV SOLN
4.0000 mg | Freq: Once | INTRAVENOUS | Status: AC
  Administered 2019-06-17: 4 mg via INTRAVENOUS
  Filled 2019-06-17: qty 1

## 2019-06-17 MED ORDER — ONDANSETRON HCL 4 MG/2ML IJ SOLN
4.0000 mg | Freq: Once | INTRAMUSCULAR | Status: AC
  Administered 2019-06-17: 4 mg via INTRAVENOUS
  Filled 2019-06-17: qty 2

## 2019-06-17 MED ORDER — SULFAMETHOXAZOLE-TRIMETHOPRIM 800-160 MG PO TABS: 1.0000 | ORAL_TABLET | Freq: Two times a day (BID) | ORAL | 0 refills | Status: AC

## 2019-06-17 MED ORDER — IOHEXOL 300 MG/ML  SOLN
100.0000 mL | Freq: Once | INTRAMUSCULAR | Status: AC | PRN
  Administered 2019-06-17: 16:00:00 100 mL via INTRAVENOUS

## 2019-06-17 MED ORDER — HYDROMORPHONE HCL 1 MG/ML IJ SOLN
0.5000 mg | Freq: Once | INTRAMUSCULAR | Status: AC
  Administered 2019-06-17: 0.5 mg via INTRAVENOUS
  Filled 2019-06-17: qty 1

## 2019-06-17 MED ORDER — SODIUM CHLORIDE 0.9 % IV BOLUS
1000.0000 mL | Freq: Once | INTRAVENOUS | Status: AC
  Administered 2019-06-17: 1000 mL via INTRAVENOUS

## 2019-06-17 MED ORDER — SODIUM CHLORIDE 0.9% FLUSH: 3.0000 mL | Freq: Once | INTRAVENOUS | Status: DC

## 2019-06-17 MED ORDER — SODIUM CHLORIDE (PF) 0.9 % IJ SOLN
INTRAMUSCULAR | Status: AC
  Filled 2019-06-17: qty 50

## 2019-06-17 MED ORDER — ONDANSETRON 4 MG PO TBDP: 4.0000 mg | ORAL_TABLET | Freq: Three times a day (TID) | ORAL | 0 refills | Status: DC | PRN

## 2019-06-17 NOTE — ED Notes (Signed)
Pt aware we need urine sample, unable to provide one at this time. Will continue to monitor.

## 2019-06-17 NOTE — Discharge Instructions (Addendum)
Please pick up medications and take as prescribed. We have sent your urine for culture and will call in 2-3 days time if the antibiotic needs to be changed You may take 600-800 mg Ibuprofen every 8 hours as needed for pain Please follow up with your PCP for further eval Incidentally you were found to have non-obstructing kidney stones on both kidneys. You can follow up with your urologist regarding this Return to the ED for any worsening symptoms including worsening abdominal pain, excessive vomiting, fevers > 100.4

## 2019-06-17 NOTE — ED Provider Notes (Addendum)
[name] Provider Note   CSN: [id number] Arrival date & time: 06/17/19  1259     History Chief Complaint  Patient presents with  . Abdominal Pain  . Emesis  . Diarrhea    [name] is a 34 y.o. female.  HPI Patient is a 35 year old female with past medical history of anxiety, asthma, ovarian cancer s/p bilateral oophorectomy approximately 30 years ago.  Patient 34 year old female with history Wednesday of abdominal pain, nausea, vomiting, diarrhea started yesterday and has been persistent since.  Patient states that the pain is achy, 10/10, is generalized and seems to radiate to her back and primarily in her left side.  She denies any urinary frequency, urgency, dysuria, hematuria or suprapubic discomfort.  She states she has had UTIs in the past and that this does not feel like that.  She states she has never been diagnosed with pyelonephritis in the past.  She states that she has vomited too many times to count today.  She states that she has had no abdominal surgeries apart from having her gallbladder removed and abdominal hysterectomy however on her chart is agreed that she has had a appendectomy in the past.  Reviewed EMR and last CT scans, evidence of diverticulitis in the past for evidence of diverticulosis on CPAP CT scans.  However the majority of her past CT abdomen pelvis studies were done without contrast looking for kidney stones    Past Medical History:  Diagnosis Date  . Anxiety   . Asthma   . Brain tumor ([place])   . Cancer ([place]) 1991   ovarian  . Depression   . 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 Hypertension   . Kidney stone   . Kidney stones   . Ovarian cancer ([place])   . Schizoaffective disorder   . Seizures [facility])     Patient Active Problem List   Diagnosis Date Noted  . Biliary colic 99991111  . Tobacco use disorder 09/01/2015  . Cannabis use disorder, moderate, dependence ([place])  09/01/2015  . OSA (obstructive sleep apnea) 10/07/2013  . Upper airway cough syndrome 09/03/2013  . Essential hypertension 09/03/2013  . Schizoaffective disorder, depressive type ([place]) 02/22/2011    Past Surgical History:  Procedure Laterality Date  . ABDOMINAL HYSTERECTOMY     partial; took fallopian tubes and ovaries  . APPENDECTOMY    . CHOLECYSTECTOMY N/A 07/09/2017   Procedure: LAPAROSCOPIC CHOLECYSTECTOMY;  Surgeon: Clovis Riley, MD;  Location: [facility];  Service: General;  Laterality: N/A;  . kidney stone removal       OB History    Gravida  0   Para  0   Term  0   Preterm  0   AB  0   Living  0     SAB  0   TAB  0   Ectopic  0   Multiple  0   Live Births              Family History  Problem Relation Age of Onset  . Diabetes type II Other   . Asthma Mother     Social History   Tobacco Use  . Smoking status: Light Tobacco Smoker    Packs/day: 0.15    Years: 2.00    Pack years: 0.30    Types: Cigarettes    Last attempt to quit: 07/27/2012    Years since quitting: 6.8  . Smokeless tobacco: Never Used  Substance Use Topics  . Alcohol use: No  . Drug use: Yes    Frequency: 7.0 times per week    Types: Marijuana    Home Medications Prior to Admission medications   Medication Sig Start Date End Date Taking? Authorizing Provider  acetaminophen (TYLENOL) 325 MG tablet Take 650 mg by mouth every 6 (six) hours as needed for mild pain or headache.   Yes [provider]  hydrochlorothiazide (HYDRODIURIL) 25 MG tablet Take 1 tablet (25 mg total) by mouth daily. Patient not taking: Reported on 06/17/2019 09/02/15   Pucilowska, Herma Ard B, MD  ibuprofen (ADVIL,MOTRIN) 800 MG tablet Take 1 tablet (800 mg total) by mouth 3 (three) times daily. Patient not taking: Reported on 06/17/2019 04/03/18   Domenic Moras, PA-C  oxyCODONE-acetaminophen (PERCOCET) 5-325 MG tablet Take 1 tablet by mouth every 6 (six) hours as needed for severe pain. Patient not  taking: Reported on 06/17/2019 04/01/18   Mesner, Corene Cornea, MD    Allergies    Ketorolac tromethamine, Latex, Lortab [hydrocodone-acetaminophen], and Tramadol  Review of Systems   Review of Systems  Constitutional: Negative for chills and fever.  HENT: Negative for congestion.   Eyes: Negative for pain.  Respiratory: Negative for cough and shortness of breath.   Cardiovascular: Negative for chest pain and leg swelling.  Gastrointestinal: Positive for abdominal pain, diarrhea, nausea and vomiting.  Genitourinary: Negative for dysuria.  Musculoskeletal: Negative for myalgias.  Skin: Negative for rash.  Neurological: Negative for dizziness and headaches.    Physical Exam Updated Vital Signs BP (!) 152/97   Pulse 87   Temp 100.2 F (37.9 C) (Oral)   Resp 15   SpO2 100%   Physical Exam Vitals and nursing note reviewed.  Constitutional:      General: She is not in acute distress.    Comments: Patient is pleasant 34 year old female who does not appear to be in acute distress but appears uncomfortable.  She is not ill-appearing or toxic.  Is not diaphoretic.  HENT:     Head: Normocephalic and atraumatic.     Nose: Nose normal.     Mouth/Throat:     Comments: Dry oral mucosa Eyes:     General: No scleral icterus. Cardiovascular:     Rate and Rhythm: Normal rate and regular rhythm.     Pulses: Normal pulses.     Heart sounds: Normal heart sounds.  Pulmonary:     Effort: Pulmonary effort is normal. No respiratory distress.     Breath sounds: No wheezing.  Abdominal:     Palpations: Abdomen is soft.     Tenderness: There is abdominal tenderness in the left lower quadrant.     Comments: Obese abdomen.  Soft with no guarding.  There is tenderness to palpation of the left lower quadrant.  No guarding or rebound.  Negative Murphy's, McBurney's, Rovsing, there is no CVA tenderness of the left or right flank.  Negative psoas and obturator sign.  Musculoskeletal:     Cervical back: Normal  range of motion.     Right lower leg: No edema.     Left lower leg: No edema.  Skin:    General: Skin is warm and dry.     Capillary Refill: Capillary refill takes less than 2 seconds.  Neurological:     Mental Status: She is alert. Mental status is at baseline.  Psychiatric:        Mood and Affect: Mood normal.        Behavior:  Behavior normal.     ED Results / Procedures / Treatments   Labs (all labs ordered are listed, but only abnormal results are displayed) Labs Reviewed  COMPREHENSIVE METABOLIC PANEL - Abnormal; Notable for the following components:      Result Value   AST 14 (*)    All other components within normal limits  CBC - Abnormal; Notable for the following components:   WBC 14.1 (*)    RBC 3.84 (*)    All other components within normal limits  LIPASE, BLOOD  URINALYSIS, ROUTINE W REFLEX MICROSCOPIC  LACTIC ACID, PLASMA  LACTIC ACID, PLASMA  DIFFERENTIAL  POC URINE PREG, ED    EKG None  Radiology No results found.  Procedures Procedures (including critical care time)  Medications Ordered in ED Medications  morphine 4 MG/ML injection 4 mg (4 mg Intravenous Given 06/17/19 1459)  ondansetron (ZOFRAN) injection 4 mg (4 mg Intravenous Given 06/17/19 1459)  sodium chloride 0.9 % bolus 1,000 mL (1,000 mLs Intravenous New Bag/Given 06/17/19 1505)    ED Course  I have reviewed the triage vital signs and the nursing notes.  Pertinent labs & imaging results that were available during my care of the patient were reviewed by me and considered in my medical decision making (see chart for details).  Patient is 34 year old female with past medical history detailed above.  Presented today with nausea, vomiting, diarrhea, fevers.  Exam significant for generalized abdominal pain that appears to be focal in left lower quadrant.  No guarding or rebound.  Given patient's low-grade fever 100.2, localized abdominal tenderness and history concerning for pyelonephritis VS  diverticulitis VS cystitis.   Clinical Course as of Jun 16 1600  Wed Jun 17, 2851  3527 34 year old female here with generalized abdominal pain nausea vomiting diarrhea that started yesterday.  Borderline fever and tachycardia.  Soft abdomen.  Getting labs CAT scan abdomen pelvis.  Disposition per results of testing.   [MB]    Clinical Course User Index [MB] Hayden Rasmussen, MD   CBC shows mild leukocytosis of 14.1.  No anemia.  CMP without any acute abnormality. Lipase WNLs.   Urinalysis, urine pregnancy, lactic acid, and CBC differential pending at time of shift change.  Patient care transferred to [facility] who will follow up on his labs and disposition appropriately.  CT scan is also pending at this time which we evaluated by our point provider.  I reevaluated patient and updated her on plan.  She feels significantly improved her pain after 4 mg of morphine and states that her nausea is completely resolved.  DDX: pyelonephritis / cystitis vs diverticulitis vs viral gastroenteritis  Anticipate discharge if CT scan is WNLs; however, I defer to oncoming provider reassessment.   MDM Rules/Calculators/A&P                      Final Clinical Impression(s) / ED Diagnoses Final diagnoses:  Nausea vomiting and diarrhea  Left lower quadrant abdominal pain    Rx / DC Orders ED Discharge Orders    None       Tedd Sias, [place] 06/17/19 [place], Justice Aguirre [place], [place] 06/17/19 1604    Hayden Rasmussen, MD 06/17/19 1758

## 2019-06-17 NOTE — ED Notes (Signed)
Pt advised P/O challenge per MD order. Per pt unable to drink any fluids at this time. Pt provided w/gingle ale and ice at bedside, advised to call for assistance as needed. [employer]

## 2019-06-17 NOTE — ED Provider Notes (Signed)
Care assumed from [facility], [place], at shift change, please see their notes for full documentation of patient's complaint/HPI. Briefly, pt here with complaint of abd pain, N/V/D. Results so far show leukocytosis of 14.1, CMP without electrolyte abnormalities. Lactic acid added given fever 100.2 on arrival to the ED with leukocytosis. Awaiting lactic acid, U/A, and CT scan. Plan is to dispo accordingly afterwards.   Physical Exam  BP (!) 152/97   Pulse 87   Temp 100.2 F (37.9 C) (Oral)   Resp 15   SpO2 100%   Physical Exam Vitals and nursing note reviewed.  Constitutional:      Appearance: She is not ill-appearing.  HENT:     Head: Normocephalic and atraumatic.  Eyes:     Conjunctiva/sclera: Conjunctivae normal.  Cardiovascular:     Rate and Rhythm: Normal rate and regular rhythm.  Pulmonary:     Effort: Pulmonary effort is normal.     Breath sounds: Normal breath sounds.  Abdominal:     Tenderness: There is abdominal tenderness in the left lower quadrant. There is right CVA tenderness and left CVA tenderness. There is no guarding or rebound.  Skin:    General: Skin is warm and dry.     Coloration: Skin is not jaundiced.  Neurological:     Mental Status: She is alert.     ED Course/Procedures   Clinical Course as of Jun 17 1526  Wed Jun 17, 3455  4311 34 year old female here with generalized abdominal pain nausea vomiting diarrhea that started yesterday.  Borderline fever and tachycardia.  Soft abdomen.  Getting labs CAT scan abdomen pelvis.  Disposition per results of testing.   [MB]    Clinical Course User Index [MB] Hayden Rasmussen, MD    Procedures  Results for orders placed or performed during the hospital encounter of 06/17/19  Lipase, blood  Result Value Ref Range   Lipase 26 11 - 51 U/L  Comprehensive metabolic panel  Result Value Ref Range   Sodium 138 135 - 145 mmol/L   Potassium 3.6 3.5 - 5.1 mmol/L   Chloride 104 98 - 111 mmol/L   CO2 24 22 - 32  mmol/L   Glucose, Bld 94 70 - 99 mg/dL   BUN 13 6 - 20 mg/dL   Creatinine, Ser 0.80 0.44 - 1.00 mg/dL   Calcium 8.9 8.9 - 10.3 mg/dL   Total Protein 7.4 6.5 - 8.1 g/dL   Albumin 3.9 3.5 - 5.0 g/dL   AST 14 (L) 15 - 41 U/L   ALT 18 0 - 44 U/L   Alkaline Phosphatase 80 38 - 126 U/L   Total Bilirubin 0.5 0.3 - 1.2 mg/dL   GFR calc non Af Amer >60 >60 mL/min   GFR calc Af Amer >60 >60 mL/min   Anion gap 10 5 - 15  CBC  Result Value Ref Range   WBC 14.1 (H) 4.0 - 10.5 K/uL   RBC 3.84 (L) 3.87 - 5.11 MIL/uL   Hemoglobin 12.4 12.0 - 15.0 g/dL   HCT 37.6 36.0 - 46.0 %   MCV 97.9 80.0 - 100.0 fL   MCH 32.3 26.0 - 34.0 pg   MCHC 33.0 30.0 - 36.0 g/dL   RDW 12.3 11.5 - 15.5 %   Platelets 311 150 - 400 K/uL   nRBC 0.0 0.0 - 0.2 %  Urinalysis, Routine w reflex microscopic  Result Value Ref Range   Color, Urine YELLOW YELLOW   APPearance CLEAR CLEAR  Specific Gravity, Urine 1.023 1.005 - 1.030   pH 6.0 5.0 - 8.0   Glucose, UA NEGATIVE NEGATIVE mg/dL   Hgb urine dipstick NEGATIVE NEGATIVE   Bilirubin Urine NEGATIVE NEGATIVE   Ketones, ur NEGATIVE NEGATIVE mg/dL   Protein, ur 30 (A) NEGATIVE mg/dL   Nitrite NEGATIVE NEGATIVE   Leukocytes,Ua LARGE (A) NEGATIVE   RBC / HPF 21-50 0 - 5 RBC/hpf   WBC, UA >50 (H) 0 - 5 WBC/hpf   Bacteria, UA RARE (A) NONE SEEN   Squamous Epithelial / LPF 6-10 0 - 5   Mucus PRESENT   Lactic acid, plasma  Result Value Ref Range   Lactic Acid, Venous 1.0 0.5 - 1.9 mmol/L  Differential  Result Value Ref Range   Neutrophils Relative % 85 %   Neutro Abs 11.5 (H) 1.7 - 7.7 K/uL   Lymphocytes Relative 12 %   Lymphs Abs 1.6 0.7 - 4.0 K/uL   Monocytes Relative 3 %   Monocytes Absolute 0.4 0.1 - 1.0 K/uL   Eosinophils Relative 0 %   Eosinophils Absolute 0.0 0.0 - 0.5 K/uL   Basophils Relative 0 %   Basophils Absolute 0.0 0.0 - 0.1 K/uL   Immature Granulocytes 0 %   Abs Immature Granulocytes 0.04 0.00 - 0.07 K/uL  POC urine preg, ED (not at [facility])  Result  Value Ref Range   Preg Test, Ur NEGATIVE NEGATIVE   CT ABDOMEN PELVIS W CONTRAST  Result Date: 06/17/2019 CLINICAL DATA:  Abdominal pain and fever, left-sided pain for 4 days EXAM: CT ABDOMEN AND PELVIS WITH CONTRAST TECHNIQUE: Multidetector CT imaging of the abdomen and pelvis was performed using the standard protocol following bolus administration of intravenous contrast. CONTRAST:  140mL OMNIPAQUE IOHEXOL 300 MG/ML  SOLN COMPARISON:  02/08/2017 FINDINGS: Lower chest: No acute pleural or parenchymal lung disease. Hepatobiliary: No focal liver abnormality is seen. Status post cholecystectomy. No biliary dilatation. Pancreas: Unremarkable. No pancreatic ductal dilatation or surrounding inflammatory changes. Spleen: Normal in size without focal abnormality. Adrenals/Urinary Tract: There are bilateral punctate less than 2 mm nonobstructing caliceal calculi. No obstructive uropathy within either kidney. The kidneys enhance normally. The bladder is decompressed which limits its evaluation. The adrenals are unremarkable. Stomach/Bowel: No bowel obstruction or ileus. No bowel wall thickening or inflammatory changes. The appendix is surgically absent. Vascular/Lymphatic: No significant vascular findings are present. No enlarged abdominal or pelvic lymph nodes. Reproductive: Partial hysterectomy is been performed. There are no adnexal masses. Other: No abdominal wall hernia or abnormality. No abdominopelvic ascites. Musculoskeletal: No acute or destructive bony lesions. Reconstructed images demonstrate no additional findings. IMPRESSION: 1. Punctate bilateral nonobstructing caliceal calculi. 2. Otherwise unremarkable CT of the abdomen and pelvis. Electronically Signed   By: Randa Ngo M.D.   On: 06/17/2019 16:58    MDM  CT scan with findings of punctate bilateral nonobstructing calculi in the calcicys. No hydronephrosis appreciated.   U/A with large leuks, 21-50 RBCs, > 50 WBCs, rare bacteria. On reevaluation  pt appears to have bilateral CVA TTP after being told she has stones on both sides. Does not appear she had CVA TTP on initial evaluation. However given elevated temp today of 100.2 on arrival (repeat 98.7) and leukocytosis will treat for pyelo at this time.   Pt continues to complain of pain despite morphine. Will give a very small dose of dilaudid and fluid challenge pt prior to dispo. Urine culture sent.   Pt able to tolerate fluids without difficulty. Will discharge home at  this time with close PCP follow up. Feel her kidney stones are incidental findings; pt has urologist she can follow up with. Strict return precautions discussed with pt; she is in agreement with plan and stable for discharge home.   This note was prepared using Dragon voice recognition software and may include unintentional dictation errors due to the inherent limitations of voice recognition software.  Problem List Items Addressed This Visit    None    Visit Diagnoses    Nausea vomiting and diarrhea    -  Primary   Left lower quadrant abdominal pain       Pyelonephritis       Relevant Medications   sulfamethoxazole-trimethoprim (BACTRIM DS) 800-160 MG tablet        Discharge Instructions     Please pick up medications and take as prescribed. We have sent your urine for culture and will call in 2-3 days time if the antibiotic needs to be changed You may take 600-800 mg Ibuprofen every 8 hours as needed for pain Please follow up with your PCP for further eval Incidentally you were found to have non-obstructing kidney stones on both kidneys. You can follow up with your urologist regarding this Return to the ED for any worsening symptoms including worsening abdominal pain, excessive vomiting, fevers > 100.4          Eustaquio Maize, PA-C 06/17/19 [place] Nizh[place], [place], DO 06/17/19 2202

## 2019-06-17 NOTE — ED Notes (Signed)
Pt ambulatory to bathroom, standby assistance.  

## 2019-06-17 NOTE — ED Triage Notes (Signed)
Per [facility] pt from home for abd pain with n/v/d that started yesterday. Marland Kitchen

## 2019-06-17 NOTE — ED Notes (Signed)
Pt ambulatory from triage 

## 2019-06-19 LAB — URINE CULTURE: Culture: 50000 — AB

## 2019-06-21 ENCOUNTER — Telehealth: Payer: Self-pay | Admitting: Emergency Medicine

## 2019-06-21 NOTE — Telephone Encounter (Signed)
Post ED Visit - Positive Culture Follow-up  Culture report reviewed by antimicrobial stewardship pharmacist: [place] Team []  Elenor Quinones, Pharm.D. []  Heide Guile, Pharm.D., BCPS AQ-ID []  Parks Neptune, Pharm.D., BCPS []  Alycia Rossetti, Pharm.D., BCPS []  [place], Pharm.D., BCPS, AAHIVP []  Legrand Como, Pharm.D., BCPS, AAHIVP []  Salome Arnt, PharmD, BCPS []  Johnnette Gourd, PharmD, BCPS []  Hughes Better, PharmD, BCPS [x]  Duanne Limerick, PharmD []  Laqueta Linden, PharmD, BCPS []  Albertina Parr, PharmD  [place] Team []  Leodis Sias, PharmD []  Lindell Spar, PharmD []  Royetta Asal, PharmD []  Graylin Shiver, Rph []  Rema Fendt) Glennon Mac, PharmD []  Arlyn Dunning, PharmD []  Netta Cedars, PharmD []  Dia Sitter, PharmD []  Leone Haven, PharmD []  Gretta Arab, PharmD []  Theodis Shove, PharmD []  Peggyann Juba, PharmD []  Reuel Boom, PharmD   Positive urine culture Treated with Sulfamethoxazole-trimethoprim, organism sensitive to the same and no further patient follow-up is required at this time.  Sandi Raveling Guyla Bless 06/21/2019, 1:13 PM

## 2020-02-15 ENCOUNTER — Other Ambulatory Visit: Payer: Self-pay

## 2020-02-15 ENCOUNTER — Encounter (HOSPITAL_COMMUNITY): Payer: Self-pay | Admitting: Emergency Medicine

## 2020-02-15 ENCOUNTER — Emergency Department (HOSPITAL_COMMUNITY)
Admission: EM | Admit: 2020-02-15 | Discharge: 2020-02-16 | Disposition: A | Discharge status: Home / Self Care | Payer: Medicaid Other | Attending: Emergency Medicine | Admitting: Emergency Medicine

## 2020-02-15 DIAGNOSIS — F329 Major depressive disorder, single episode, unspecified: Secondary | ICD-10-CM | POA: Insufficient documentation

## 2020-02-15 DIAGNOSIS — Z23 Encounter for immunization: Secondary | ICD-10-CM | POA: Insufficient documentation

## 2020-02-15 DIAGNOSIS — W540XXA Bitten by dog, initial encounter: Secondary | ICD-10-CM | POA: Diagnosis not present

## 2020-02-15 DIAGNOSIS — Z20822 Contact with and (suspected) exposure to covid-19: Secondary | ICD-10-CM | POA: Diagnosis not present

## 2020-02-15 DIAGNOSIS — J45909 Unspecified asthma, uncomplicated: Secondary | ICD-10-CM | POA: Diagnosis not present

## 2020-02-15 DIAGNOSIS — Z8543 Personal history of malignant neoplasm of ovary: Secondary | ICD-10-CM | POA: Diagnosis not present

## 2020-02-15 DIAGNOSIS — W1830XA Fall on same level, unspecified, initial encounter: Secondary | ICD-10-CM | POA: Insufficient documentation

## 2020-02-15 DIAGNOSIS — S71151A Open bite, right thigh, initial encounter: Secondary | ICD-10-CM | POA: Diagnosis not present

## 2020-02-15 DIAGNOSIS — R440 Auditory hallucinations: Secondary | ICD-10-CM

## 2020-02-15 DIAGNOSIS — R45851 Suicidal ideations: Secondary | ICD-10-CM | POA: Insufficient documentation

## 2020-02-15 DIAGNOSIS — R45850 Homicidal ideations: Secondary | ICD-10-CM

## 2020-02-15 DIAGNOSIS — S52502A Unspecified fracture of the lower end of left radius, initial encounter for closed fracture: Secondary | ICD-10-CM | POA: Diagnosis not present

## 2020-02-15 DIAGNOSIS — F250 Schizoaffective disorder, bipolar type: Secondary | ICD-10-CM | POA: Diagnosis present

## 2020-02-15 DIAGNOSIS — F32A Depression, unspecified: Secondary | ICD-10-CM

## 2020-02-15 DIAGNOSIS — F17210 Nicotine dependence, cigarettes, uncomplicated: Secondary | ICD-10-CM | POA: Insufficient documentation

## 2020-02-15 DIAGNOSIS — I10 Essential (primary) hypertension: Secondary | ICD-10-CM | POA: Diagnosis not present

## 2020-02-15 MED ORDER — LORAZEPAM 1 MG PO TABS
1.0000 mg | ORAL_TABLET | Freq: Once | ORAL | Status: AC
  Administered 2020-02-16: 1 mg via ORAL
  Filled 2020-02-15: qty 1

## 2020-02-15 NOTE — ED Notes (Signed)
Pt has  Two belonging bags. Belonging bags places in triage nursing station.

## 2020-02-15 NOTE — ED Triage Notes (Signed)
Patient reports worsening depression and passive thoughts of SI without plan since running out of psych medications x1 week ago. Reports dog bite to right leg x3 days ago. States dog was up to date on shots.

## 2020-02-15 NOTE — ED Provider Notes (Signed)
[facility] DEPT Provider Note   CSN: [id number] Arrival date & time: 02/15/20  2228     History Chief Complaint  Patient presents with  . Depression  . Animal Bite    [name] is a 34 y.o. female with a history of schizoaffective disorder, seizures, ovarian cancer, kidney stones, anxiety, depression, and asthma who presents to the emergency department by police with a chief complaint of suicidal ideation.   The patient reports constant, worsening suicidal ideation over the last few days.  States that she has mentioned overdosing on pills.  She also endorses passive homicidal ideation and auditory hallucinations.  She denies visual hallucinations.  States that she ran out of all her medications about a week ago.  Her medications include Xanax, hydroxyzine, Seroquel, and Adderall.  Reports that her last set of Xanax was on 12/16.  She called her doctor's office, Dr. Baird Cancer, on 12/17, but was told that he would be out of the office until after the new year and that if she develops worsening symptoms that she should go to the ER.  She called back to the office again yesterday to discuss worsening symptoms and was advised to come to the ER she may need psychiatric admission.  She also reports that approximately 3 days ago that she was bitten by a pit bull to her right leg.  During the incident, patient reports that she fell backwards with her bilateral hands outstretched and she hit her wrist on the ground.  She reports sudden onset pain of the left wrist.  No numbness or weakness.  She denies hitting her head, syncope, nausea, or vomiting.  The animal was captured by animal control and was found to be updated on all of its immunizations.  She is unsure when her Tdap was last updated.  She reports that the wound on her right thigh has been improving since onset.  No fevers, chills, redness, swelling, or warmth.  She denies chest pain, shortness of  breath, abdominal pain, nausea, vomiting, diarrhea.   The history is provided by the patient and medical records. No language interpreter was used.       Past Medical History:  Diagnosis Date  . Anxiety   . Asthma   . Brain tumor ([place])   . Cancer ([place]) 1991   ovarian  . Depression   . 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 Hypertension   . Kidney stone   . Kidney stones   . Ovarian cancer ([place])   . Schizoaffective disorder   . Seizures [facility])     Patient Active Problem List   Diagnosis Date Noted  . Biliary colic 34/74/2595  . Tobacco use disorder 09/01/2015  . Cannabis use disorder, moderate, dependence ([place]) 09/01/2015  . OSA (obstructive sleep apnea) 10/07/2013  . Upper airway cough syndrome 09/03/2013  . Essential hypertension 09/03/2013  . Schizoaffective disorder, depressive type ([place]) 02/22/2011    Past Surgical History:  Procedure Laterality Date  . ABDOMINAL HYSTERECTOMY     partial; took fallopian tubes and ovaries  . APPENDECTOMY    . CHOLECYSTECTOMY N/A 07/09/2017   Procedure: LAPAROSCOPIC CHOLECYSTECTOMY;  Surgeon: Clovis Riley, MD;  Location: [facility] ORS;  Service: General;  Laterality: N/A;  . kidney stone removal       OB History    Gravida  0   Para  0   Term  0   Preterm  0   AB  0  Living  0     SAB  0   IAB  0   Ectopic  0   Multiple  0   Live Births              Family History  Problem Relation Age of Onset  . Diabetes type II Other   . Asthma Mother     Social History   Tobacco Use  . Smoking status: Light Tobacco Smoker    Packs/day: 0.15    Years: 2.00    Pack years: 0.30    Types: Cigarettes    Last attempt to quit: 07/27/2012    Years since quitting: 7.5  . Smokeless tobacco: Never Used  Vaping Use  . Vaping Use: Never used  Substance Use Topics  . Alcohol use: No  . Drug use: Yes    Frequency: 7.0 times per week    Types: Marijuana    Home Medications Prior to Admission  medications   Medication Sig Start Date End Date Taking? Authorizing Provider  acetaminophen (TYLENOL) 325 MG tablet Take 650 mg by mouth every 6 (six) hours as needed for mild pain or headache.   Yes [provider]  ALPRAZolam Duanne Moron) 0.5 MG tablet Take 0.5 mg by mouth 3 (three) times daily. 12/08/19  Yes [provider]  amphetamine-dextroamphetamine (ADDERALL) 5 MG tablet Take 1 tablet by mouth 2 (two) times daily. 12/08/19  Yes [provider]  hydrochlorothiazide (HYDRODIURIL) 25 MG tablet Take 1 tablet (25 mg total) by mouth daily. 09/02/15  Yes Pucilowska, Jolanta B, MD  hydrOXYzine (VISTARIL) 25 MG capsule Take 25 mg by mouth 2 (two) times daily as needed for anxiety. 12/08/19  Yes [provider]  QUEtiapine (SEROQUEL) 100 MG tablet Take 100 mg by mouth at bedtime. 12/08/19  Yes [provider]    Allergies    Ketorolac tromethamine, Latex, Lortab [hydrocodone-acetaminophen], and Tramadol  Review of Systems   Review of Systems  Constitutional: Negative for activity change, chills and fever.  HENT: Negative for congestion and sore throat.   Eyes: Negative for visual disturbance.  Respiratory: Negative for cough, shortness of breath and wheezing.   Cardiovascular: Negative for chest pain and palpitations.  Gastrointestinal: Negative for abdominal pain, blood in stool, diarrhea, nausea and vomiting.  Genitourinary: Negative for dysuria, flank pain and vaginal pain.  Musculoskeletal: Positive for arthralgias and myalgias. Negative for back pain, neck pain and neck stiffness.  Skin: Positive for wound. Negative for color change and rash.  Allergic/Immunologic: Negative for immunocompromised state.  Neurological: Negative for dizziness, syncope, weakness, numbness and headaches.  Psychiatric/Behavioral: Negative for confusion and self-injury.    Physical Exam Updated Vital Signs BP 116/83   Pulse 76   Temp 98.2 F (36.8 C) (Oral)    Resp 20   Ht 5\' 5"  (1.651 m)   Wt 106.6 kg   SpO2 98%   BMI 39.11 kg/m   Physical Exam Vitals and nursing note reviewed.  Constitutional:      General: She is not in acute distress.    Appearance: She is obese. She is not ill-appearing or toxic-appearing.  HENT:     Head: Normocephalic.  Eyes:     Conjunctiva/sclera: Conjunctivae normal.  Cardiovascular:     Rate and Rhythm: Normal rate and regular rhythm.     Heart sounds: No murmur heard. No friction rub. No gallop.   Pulmonary:     Effort: Pulmonary effort is normal. No respiratory distress.  Abdominal:  General: There is no distension.     Palpations: Abdomen is soft. There is no mass.     Tenderness: There is no abdominal tenderness. There is no right CVA tenderness, left CVA tenderness, guarding or rebound.     Hernia: No hernia is present.  Musculoskeletal:     Cervical back: Neck supple.     Right lower leg: No edema.     Left lower leg: No edema.     Comments: There is a well-healing abrasion noted to the right lateral thigh.  No surrounding erythema, edema, warmth, or induration.  No red streaking.  No tenderness to palpation.  Tender to palpation to the left wrist.  No obvious deformities.  Decreased range of motion secondary to pain.  Neurovascularly intact throughout the bilateral upper extremities.  Normal exam of the left elbow.  Skin:    General: Skin is warm.     Findings: No rash.  Neurological:     Mental Status: She is alert.  Psychiatric:        Attention and Perception: She perceives auditory hallucinations. She does not perceive visual hallucinations.        Mood and Affect: Mood is depressed. Affect is tearful.        Speech: Speech normal.        Behavior: Behavior normal. Behavior is cooperative.        Thought Content: Thought content includes suicidal ideation. Thought content includes suicidal plan. Thought content does not include homicidal plan.     ED Results / Procedures / Treatments    Labs (all labs ordered are listed, but only abnormal results are displayed) Labs Reviewed  COMPREHENSIVE METABOLIC PANEL - Abnormal; Notable for the following components:      Result Value   Glucose, Bld 128 (*)    AST 14 (*)    All other components within normal limits  SALICYLATE LEVEL - Abnormal; Notable for the following components:   Salicylate Lvl <0.7 (*)    All other components within normal limits  ACETAMINOPHEN LEVEL - Abnormal; Notable for the following components:   Acetaminophen (Tylenol), Serum <10 (*)    All other components within normal limits  CBC - Abnormal; Notable for the following components:   RBC 3.83 (*)    Hemoglobin 11.8 (*)    All other components within normal limits  RESP PANEL BY RT-PCR (FLU A&B, COVID) ARPGX2  ETHANOL  RAPID URINE DRUG SCREEN, HOSP PERFORMED  PREGNANCY, URINE    EKG None  Radiology DG Wrist Complete Left  Result Date: 02/16/2020 CLINICAL DATA:  Fall left wrist 3 days prior, pain is predominantly ulnar-sided EXAM: LEFT WRIST - COMPLETE 3+ VIEW COMPARISON:  None. FINDINGS: Circumferential soft tissue swelling of the wrist with a slight cortical step-off along the volar/lateral aspect of the radial metaphysis concerning compatible with fracture. Seen no clear ulnar fracture is identified. The carpal bones appear intact. Carpal arcs are maintained. IMPRESSION: Minimally displaced fracture of the distal radial metaphysis. Circumferential wrist swelling. Electronically Signed   By: Lovena Le M.D.   On: 02/16/2020 01:27    Procedures .Critical Care Performed by: Joanne Gavel, PA-C Authorized by: Joanne Gavel, PA-C   Critical care provider statement:    Critical care was necessary to treat or prevent imminent or life-threatening deterioration of the following conditions: agitation and psychosis.   Critical care was time spent personally by me on the following activities:  Review of old charts, re-evaluation of patient's  condition,  ordering and review of radiographic studies, ordering and review of laboratory studies, ordering and performing treatments and interventions, obtaining history from patient or surrogate, examination of patient, evaluation of patient's response to treatment and development of treatment plan with patient or surrogate   I assumed direction of critical care for this patient from another provider in my specialty: no     (including critical care time)  Medications Ordered in ED Medications  ondansetron (ZOFRAN) tablet 4 mg (has no administration in time range)  acetaminophen (TYLENOL) tablet 650 mg (has no administration in time range)  alum & mag hydroxide-simeth (MAALOX/MYLANTA) 200-200-20 MG/5ML suspension 30 mL (has no administration in time range)  ziprasidone (GEODON) injection 20 mg (has no administration in time range)  sterile water (preservative free) injection (has no administration in time range)  nicotine (NICODERM CQ - dosed in mg/24 hours) patch 21 mg (has no administration in time range)  hydrochlorothiazide (HYDRODIURIL) tablet 25 mg (has no administration in time range)  ALPRAZolam (XANAX) tablet 0.5 mg (has no administration in time range)  amphetamine-dextroamphetamine (ADDERALL) tablet 5 mg (has no administration in time range)  hydrOXYzine (VISTARIL) capsule 25 mg (has no administration in time range)  QUEtiapine (SEROQUEL) tablet 100 mg (has no administration in time range)  ibuprofen (ADVIL) tablet 600 mg (has no administration in time range)  amoxicillin-clavulanate (AUGMENTIN) 875-125 MG per tablet 1 tablet (has no administration in time range)  LORazepam (ATIVAN) tablet 1 mg (1 mg Oral Given 02/16/20 0010)  Tdap (BOOSTRIX) injection 0.5 mL (0.5 mLs Intramuscular Given 02/16/20 0104)  ibuprofen (ADVIL) tablet 600 mg (600 mg Oral Given 02/16/20 0103)  amoxicillin-clavulanate (AUGMENTIN) 875-125 MG per tablet 1 tablet (1 tablet Oral Given 02/16/20 0135)    ED  Course  I have reviewed the triage vital signs and the nursing notes.  Pertinent labs & imaging results that were available during my care of the patient were reviewed by me and considered in my medical decision making (see chart for details).  Clinical Course as of 02/16/20 0815  Tue Feb 16, 2020  0803 Notified by nurse tech that patient had eloped from the emergency department and was out in the parking lot.  I immediately went to the patient's room and found the patient back in her room for security.  The patient states that she awoke and was confused about where she was.  The staff member was unable to give her any information or updates and she thought she was free to leave and she decided that she wanted to go home.  However, patient is at high risk for seizures and benzodiazepine withdrawal given that she has been off of her home Xanax for more than 5 days.  She has also had SI, HI, and auditory hallucinations.  I am concerned that the patient is a danger to herself and others.  Attempted redirection verbally for several minutes, but unfortunately patient became increasingly agitated and confrontational with staff.  She was screaming and cursing at staff.  IVC paperwork was completed by Dr. Dayna Barker, attending physician.  She will require Geodon for stabilization and reassessment.  Her home medications have also been ordered. [MM]    Clinical Course User Index [MM] Leiann Sporer, Laymond Purser, PA-C   MDM Rules/Calculators/A&P                          34 year old female with a history of schizoaffective disorder, seizures, ovarian cancer, kidney stones, anxiety, depression, and asthma  presenting with multiple complaints.  Her primary complaint is worsening suicidal ideation, auditory hallucinations, and vague homicidal ideation over the last week.  She also reports that she is in a plan to overdose complaints.  This is in the setting of running out of her home medications and missed week ago.  She has  chronically been on Xanax for several years and her last dose of the medication was approximately 5 days ago.  She is at higher risk for benzodiazepine withdrawal and seizures.  Will order medical screening labs and plan for TTS consult.  Patient is voluntary and agreeable with plan at this time.  Labs and imaging have been independently reviewed and interpreted by me.  Patient also reports that she was bitten by a dog on her right thigh 3 days ago and had an associated fall where she fell backwards and has been having constant left wrist pain since the fall.  On exam, she is tender to palpation to the left wrist and x-ray demonstrates a minimally displaced distal radius fracture on the left.  We will place the patient in a sugar tong splint and sling.  She will need to follow-up with orthopedics.  Her Tdap was updated today and she will be started on a 5-day course of Augmentin for prophylaxis for dog bite.  She is anemic on her screening labs.  Labs are otherwise unremarkable.  UDS is pending.  Patient is medically cleared at this time.  Unfortunately, after many hours, the patient became increasingly agitated and aggressive with staff.  She attempted to elope from the emergency department and given concern that she is off of her medications and has been having SI, HI, and auditory  hallucinations, she was IVC'ed.  Please see above for full details of the event and Geodon was required for patient safety and stabilization.  Previously, TTS had recommended observation with repeat assessment by psychiatry later in the day.  I also recommended that if she had an act team member that they would need to be contacted by transition of care.  However, given significant change in clinical status, she will need reassessed by psychiatry. Pt medically cleared at this time. Psych hold orders and home med orders placed. TTS consult pending; please see psych team notes for further documentation of care/dispo. Pt stable  at time of med clearance.    Final Clinical Impression(s) / ED Diagnoses Final diagnoses:  Depression with suicidal ideation  Auditory hallucination  Homicidal ideations  Closed fracture of distal end of left radius, unspecified fracture morphology, initial encounter  Dog bite of right thigh, initial encounter    Rx / DC Orders ED Discharge Orders    None       Joanne Gavel, PA-C 02/16/20 7341    Merrily Pew, MD 02/16/20 2303

## 2020-02-16 ENCOUNTER — Emergency Department (HOSPITAL_COMMUNITY): Payer: Medicaid Other

## 2020-02-16 LAB — COMPREHENSIVE METABOLIC PANEL
ALT: 15 U/L (ref 0–44)
AST: 14 U/L — ABNORMAL LOW (ref 15–41)
Albumin: 4.1 g/dL (ref 3.5–5.0)
Alkaline Phosphatase: 87 U/L (ref 38–126)
Anion gap: 11 (ref 5–15)
BUN: 18 mg/dL (ref 6–20)
CO2: 27 mmol/L (ref 22–32)
Calcium: 9.3 mg/dL (ref 8.9–10.3)
Chloride: 103 mmol/L (ref 98–111)
Creatinine, Ser: 0.81 mg/dL (ref 0.44–1.00)
GFR, Estimated: >60 mL/min (ref >60)
Glucose, Bld: 128 mg/dL — ABNORMAL HIGH (ref 70–99)
Potassium: 3.6 mmol/L (ref 3.5–5.1)
Sodium: 141 mmol/L (ref 135–145)
Total Bilirubin: 0.6 mg/dL (ref 0.3–1.2)
Total Protein: 7.9 g/dL (ref 6.5–8.1)

## 2020-02-16 LAB — ETHANOL: Alcohol, Ethyl (B): <10 mg/dL (ref <10)

## 2020-02-16 LAB — ACETAMINOPHEN LEVEL: Acetaminophen (Tylenol), Serum: <10 ug/mL — ABNORMAL LOW (ref 10–30)

## 2020-02-16 LAB — CBC
HCT: 37.4 % (ref 36.0–46.0)
Hemoglobin: 11.8 g/dL — ABNORMAL LOW (ref 12.0–15.0)
MCH: 30.8 pg (ref 26.0–34.0)
MCHC: 31.6 g/dL (ref 30.0–36.0)
MCV: 97.7 fL (ref 80.0–100.0)
Platelets: 382 10*3/uL (ref 150–400)
RBC: 3.83 MIL/uL — ABNORMAL LOW (ref 3.87–5.11)
RDW: 12.6 % (ref 11.5–15.5)
WBC: 8.5 10*3/uL (ref 4.0–10.5)
nRBC: 0 % (ref 0.0–0.2)

## 2020-02-16 LAB — SALICYLATE LEVEL: Salicylate Lvl: <7 mg/dL — ABNORMAL LOW (ref 7.0–30.0)

## 2020-02-16 LAB — PREGNANCY, URINE: Preg Test, Ur: NEGATIVE

## 2020-02-16 LAB — RESP PANEL BY RT-PCR (FLU A&B, COVID) ARPGX2
Influenza A by PCR: NEGATIVE
Influenza B by PCR: NEGATIVE
SARS Coronavirus 2 by RT PCR: NEGATIVE

## 2020-02-16 MED ORDER — AMOXICILLIN-POT CLAVULANATE 875-125 MG PO TABS
1.0000 | ORAL_TABLET | Freq: Once | ORAL | Status: AC
  Administered 2020-02-16: 02:00:00 1 via ORAL
  Filled 2020-02-16: qty 1

## 2020-02-16 MED ORDER — AMOXICILLIN-POT CLAVULANATE 875-125 MG PO TABS: 1.0000 | ORAL_TABLET | Freq: Two times a day (BID) | ORAL | Status: DC

## 2020-02-16 MED ORDER — ACETAMINOPHEN-CODEINE #3 300-30 MG PO TABS
1.0000 | ORAL_TABLET | Freq: Once | ORAL | Status: AC
  Administered 2020-02-16: 17:00:00 1 via ORAL
  Filled 2020-02-16: qty 1

## 2020-02-16 MED ORDER — STERILE WATER FOR INJECTION IJ SOLN
INTRAMUSCULAR | Status: AC
  Filled 2020-02-16: qty 10

## 2020-02-16 MED ORDER — IBUPROFEN 200 MG PO TABS
600.0000 mg | ORAL_TABLET | Freq: Once | ORAL | Status: DC
  Filled 2020-02-16: qty 3

## 2020-02-16 MED ORDER — HYDROXYZINE HCL 25 MG PO TABS: 25.0000 mg | ORAL_TABLET | Freq: Two times a day (BID) | ORAL | Status: DC | PRN

## 2020-02-16 MED ORDER — ACETAMINOPHEN 325 MG PO TABS: 650.0000 mg | ORAL_TABLET | ORAL | Status: DC | PRN

## 2020-02-16 MED ORDER — ALUM & MAG HYDROXIDE-SIMETH 200-200-20 MG/5ML PO SUSP: 30.0000 mL | Freq: Four times a day (QID) | ORAL | Status: DC | PRN

## 2020-02-16 MED ORDER — AMPHETAMINE-DEXTROAMPHETAMINE 10 MG PO TABS
5.0000 mg | ORAL_TABLET | Freq: Two times a day (BID) | ORAL | Status: DC
  Filled 2020-02-16: qty 1

## 2020-02-16 MED ORDER — NICOTINE 21 MG/24HR TD PT24: 21.0000 mg | MEDICATED_PATCH | Freq: Once | TRANSDERMAL | Status: DC

## 2020-02-16 MED ORDER — TETANUS-DIPHTH-ACELL PERTUSSIS 5-2.5-18.5 LF-MCG/0.5 IM SUSY
0.5000 mL | PREFILLED_SYRINGE | Freq: Once | INTRAMUSCULAR | Status: AC
  Administered 2020-02-16: 01:00:00 0.5 mL via INTRAMUSCULAR
  Filled 2020-02-16: qty 0.5

## 2020-02-16 MED ORDER — IBUPROFEN 200 MG PO TABS
600.0000 mg | ORAL_TABLET | Freq: Once | ORAL | Status: AC
  Administered 2020-02-16: 01:00:00 600 mg via ORAL
  Filled 2020-02-16: qty 3

## 2020-02-16 MED ORDER — ACETAMINOPHEN-CODEINE #3 300-30 MG PO TABS
1.0000 | ORAL_TABLET | Freq: Four times a day (QID) | ORAL | 0 refills | Status: AC | PRN
Start: 2020-02-16 — End: 2020-02-19

## 2020-02-16 MED ORDER — HYDROCHLOROTHIAZIDE 25 MG PO TABS
25.0000 mg | ORAL_TABLET | Freq: Every day | ORAL | Status: DC
  Filled 2020-02-16: qty 1

## 2020-02-16 MED ORDER — QUETIAPINE FUMARATE 100 MG PO TABS: 100.0000 mg | ORAL_TABLET | Freq: Every day | ORAL | Status: DC

## 2020-02-16 MED ORDER — ONDANSETRON HCL 4 MG PO TABS: 4.0000 mg | ORAL_TABLET | Freq: Three times a day (TID) | ORAL | Status: DC | PRN

## 2020-02-16 MED ORDER — ALPRAZOLAM 0.5 MG PO TABS
0.5000 mg | ORAL_TABLET | Freq: Three times a day (TID) | ORAL | Status: DC
  Administered 2020-02-16: 17:00:00 0.5 mg via ORAL
  Filled 2020-02-16 (×3): qty 1

## 2020-02-16 MED ORDER — ZIPRASIDONE MESYLATE 20 MG IM SOLR
20.0000 mg | Freq: Once | INTRAMUSCULAR | Status: AC
  Administered 2020-02-16: 08:00:00 20 mg via INTRAMUSCULAR
  Filled 2020-02-16: qty 20

## 2020-02-16 NOTE — ED Notes (Addendum)
Pt extremely upset due to being IVC'd. Yelling and voicing concern about "What the F**k going on?" Mia PA trying to explain and calm pt. Pt continues to curse as she is upset.

## 2020-02-16 NOTE — ED Notes (Signed)
Patient threatening to come back and harm staff, "fuck the hospital up", etc. Patient wanting to know the names of staff and would then say she "would come back and kick their ass."

## 2020-02-16 NOTE — ED Notes (Signed)
Attempted to obtain bloodwork with Ultrasound after patient refused to allow staff to stick without it. After starting patient immediately yells to take it out that it hurts. PA Mia made aware.
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
## 2020-02-16 NOTE — BH Assessment (Signed)
[facility] Assessment Progress Note  Per Shuvon Rankin, NP, this pt does not require psychiatric hospitalization at this time.  Pt presents under IVC initiated by EDP Merrily Pew, MD which has been rescinded by Hampton Abbot, MD.  Pt is psychiatrically cleared.  Discharge instructions advise pt to follow up with [facility].  EDP's Blanchie Dessert, MD and Madalyn Rob, MD and pt's nurse, [facility], have been notified.  Jalene Mullet, [place] Triage Specialist [phone]
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
## 2020-02-16 NOTE — ED Notes (Signed)
Patient trying to get into the needle box and has armed herself with otoscope and phone. [employer] officer and security x 3 at the doorway.

## 2020-02-16 NOTE — ED Notes (Signed)
Pt yelling and threatening staff with phone, otoscope as well as has O2 on 15 L trying to pull it off the wall. Writer able to remove cords from room and gradually get O2 regulator off wall. Pt will not stop yelling and threatening staff with deescalating measures. At this time she becomes more violent in her behavior. Pt taken down with [facility] procedures. Due to behavior she was placed on hospital bed and restraints applied excluding left wrist ( due to splint) a hand mitten was placed over splint to keep pt/staff safe. Pt moved to room 9 for continued monitoring.

## 2020-02-16 NOTE — ED Notes (Signed)
Patient attempted to leave. EDPA notified and verbal order that the patient was going to be IVC'd. Security at the doorway. patienat yelling, cursing and refusing to give up her belongings.

## 2020-02-16 NOTE — ED Notes (Signed)
Patient refused po meds ordered.

## 2020-02-16 NOTE — ED Notes (Signed)
Patient refuses to cooperate with blood draw. She insists on ultrasound. This RN used ultrasound at her request and she refused to stay still and immediately demanded that it be removed. PA made aware.

## 2020-02-16 NOTE — Progress Notes (Signed)
Orthopedic Tech Progress Note Patient Details:  [name] August 01, 1985 [id number]  Ortho Devices Type of Ortho Device: Arm sling,Sugartong splint Ortho Device/Splint Location: lue Ortho Device/Splint Interventions: Ordered,Application,Adjustment   Post Interventions Patient Tolerated: Well Instructions Provided: Care of device,Adjustment of device   Karolee Stamps 02/16/2020, 6:44 AM

## 2020-02-16 NOTE — ED Notes (Signed)
Main lab called to attempt blood draw  

## 2020-02-16 NOTE — ED Notes (Signed)
Charge RN Jake at bedside to attempt blood draw.
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
## 2022-01-10 ENCOUNTER — Encounter (HOSPITAL_COMMUNITY): Payer: Self-pay | Admitting: Emergency Medicine

## 2022-01-10 ENCOUNTER — Other Ambulatory Visit: Payer: Self-pay

## 2022-01-10 ENCOUNTER — Emergency Department (HOSPITAL_COMMUNITY)
Admission: EM | Admit: 2022-01-10 | Discharge: 2022-01-10 | Disposition: A | Discharge status: Home / Self Care | Payer: Medicaid Other | Attending: Emergency Medicine | Admitting: Emergency Medicine

## 2022-01-10 DIAGNOSIS — I10 Essential (primary) hypertension: Secondary | ICD-10-CM | POA: Diagnosis not present

## 2022-01-10 DIAGNOSIS — F329 Major depressive disorder, single episode, unspecified: Secondary | ICD-10-CM | POA: Diagnosis not present

## 2022-01-10 DIAGNOSIS — F1910 Other psychoactive substance abuse, uncomplicated: Secondary | ICD-10-CM | POA: Insufficient documentation

## 2022-01-10 DIAGNOSIS — Z91040 Latex allergy status: Secondary | ICD-10-CM | POA: Diagnosis not present

## 2022-01-10 DIAGNOSIS — F1914 Other psychoactive substance abuse with psychoactive substance-induced mood disorder: Secondary | ICD-10-CM | POA: Insufficient documentation

## 2022-01-10 DIAGNOSIS — Z1152 Encounter for screening for COVID-19: Secondary | ICD-10-CM | POA: Diagnosis not present

## 2022-01-10 DIAGNOSIS — F1994 Other psychoactive substance use, unspecified with psychoactive substance-induced mood disorder: Secondary | ICD-10-CM | POA: Insufficient documentation

## 2022-01-10 DIAGNOSIS — F251 Schizoaffective disorder, depressive type: Secondary | ICD-10-CM | POA: Insufficient documentation

## 2022-01-10 DIAGNOSIS — Z79899 Other long term (current) drug therapy: Secondary | ICD-10-CM | POA: Insufficient documentation

## 2022-01-10 LAB — COMPREHENSIVE METABOLIC PANEL
ALT: 16 U/L (ref 0–44)
AST: 19 U/L (ref 15–41)
Albumin: 4 g/dL (ref 3.5–5.0)
Alkaline Phosphatase: 79 U/L (ref 38–126)
Anion gap: 7 (ref 5–15)
BUN: 21 mg/dL — ABNORMAL HIGH (ref 6–20)
CO2: 25 mmol/L (ref 22–32)
Calcium: 9.3 mg/dL (ref 8.9–10.3)
Chloride: 107 mmol/L (ref 98–111)
Creatinine, Ser: 1.11 mg/dL — ABNORMAL HIGH (ref 0.44–1.00)
GFR, Estimated: >60 mL/min (ref >60)
Glucose, Bld: 103 mg/dL — ABNORMAL HIGH (ref 70–99)
Potassium: 3.6 mmol/L (ref 3.5–5.1)
Sodium: 139 mmol/L (ref 135–145)
Total Bilirubin: 0.4 mg/dL (ref 0.3–1.2)
Total Protein: 7.6 g/dL (ref 6.5–8.1)

## 2022-01-10 LAB — PREGNANCY, URINE: Preg Test, Ur: NEGATIVE

## 2022-01-10 LAB — CBC
HCT: 37.1 % (ref 36.0–46.0)
Hemoglobin: 11.6 g/dL — ABNORMAL LOW (ref 12.0–15.0)
MCH: 30.9 pg (ref 26.0–34.0)
MCHC: 31.3 g/dL (ref 30.0–36.0)
MCV: 98.7 fL (ref 80.0–100.0)
Platelets: 363 10*3/uL (ref 150–400)
RBC: 3.76 MIL/uL — ABNORMAL LOW (ref 3.87–5.11)
RDW: 12.7 % (ref 11.5–15.5)
WBC: 10.8 10*3/uL — ABNORMAL HIGH (ref 4.0–10.5)
nRBC: 0 % (ref 0.0–0.2)

## 2022-01-10 LAB — RAPID URINE DRUG SCREEN, HOSP PERFORMED
Amphetamines: NOT DETECTED
Barbiturates: NOT DETECTED
Benzodiazepines: NOT DETECTED
Cocaine: POSITIVE — AB
Opiates: NOT DETECTED
Tetrahydrocannabinol: POSITIVE — AB

## 2022-01-10 LAB — RESP PANEL BY RT-PCR (FLU A&B, COVID) ARPGX2
Influenza A by PCR: NEGATIVE
Influenza B by PCR: NEGATIVE
SARS Coronavirus 2 by RT PCR: NEGATIVE

## 2022-01-10 LAB — ETHANOL: Alcohol, Ethyl (B): <10 mg/dL (ref <10)

## 2022-01-10 LAB — SALICYLATE LEVEL: Salicylate Lvl: <7 mg/dL — ABNORMAL LOW (ref 7.0–30.0)

## 2022-01-10 LAB — ACETAMINOPHEN LEVEL: Acetaminophen (Tylenol), Serum: <10 ug/mL — ABNORMAL LOW (ref 10–30)

## 2022-01-10 MED ORDER — QUETIAPINE FUMARATE 100 MG PO TABS
100.0000 mg | ORAL_TABLET | Freq: Every day | ORAL | Status: DC
  Administered 2022-01-10: 100 mg via ORAL
  Filled 2022-01-10: qty 1

## 2022-01-10 MED ORDER — HYDROXYZINE PAMOATE 25 MG PO CAPS: 25.0000 mg | ORAL_CAPSULE | Freq: Two times a day (BID) | ORAL | 0 refills | Status: DC | PRN

## 2022-01-10 MED ORDER — QUETIAPINE FUMARATE 100 MG PO TABS: 100.0000 mg | ORAL_TABLET | Freq: Every day | ORAL | 0 refills | Status: DC

## 2022-01-10 MED ORDER — LORAZEPAM 1 MG PO TABS
1.0000 mg | ORAL_TABLET | Freq: Once | ORAL | Status: AC
  Administered 2022-01-10: 1 mg via ORAL
  Filled 2022-01-10: qty 1

## 2022-01-10 MED ORDER — TRAZODONE HCL 100 MG PO TABS
50.0000 mg | ORAL_TABLET | Freq: Every day | ORAL | Status: DC
  Administered 2022-01-10: 50 mg via ORAL
  Filled 2022-01-10: qty 1

## 2022-01-10 MED ORDER — ONDANSETRON HCL 4 MG PO TABS: 4.0000 mg | ORAL_TABLET | Freq: Three times a day (TID) | ORAL | Status: DC | PRN

## 2022-01-10 MED ORDER — HYDROCHLOROTHIAZIDE 25 MG PO TABS: 25.0000 mg | ORAL_TABLET | Freq: Every day | ORAL | 0 refills | Status: DC

## 2022-01-10 NOTE — ED Provider Notes (Signed)
  Physical Exam  BP (!) 175/125 (BP Location: Right Arm)   Pulse 95   Temp 98.2 F (36.8 C) (Oral)   Resp 19   Ht '5\' 5"'$  (1.651 m)   Wt 106.6 kg   SpO2 97%   BMI 39.11 kg/m     Procedures  Procedures  ED Course / MDM    Medical Decision Making Amount and/or Complexity of Data Reviewed Labs: ordered.  Risk Prescription drug management.   66F, sent from magistrate for psychiatric evaluation as a condition for bail. TTS consulted. Needs med rec.  Pt evaluated by psychiatry and psychiatrically cleared. Stable for DC.  Prescriptions for home medications reordered.      Regan Lemming, MD 01/10/22 1235

## 2022-01-10 NOTE — ED Notes (Signed)
TTS starting at this time with Curlene Dolphin, LCAS

## 2022-01-10 NOTE — ED Notes (Signed)
D/c'd by other RN, CN RN
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
## 2022-01-10 NOTE — ED Notes (Signed)
Pt escorted out of ED and discharged by charge nurse. Pt agreeable to d/c, verbalizes understanding of instructions.

## 2022-01-10 NOTE — ED Notes (Addendum)
Pt ambulatory to bed [place] [facility] by ED tech Deven, he placed 2 belongings bag of pt's personal belongings under nurse's station desk across from [place] has been changed out into [facility] clothing. NAD noted, labs and urine collected in triage area by NT

## 2022-01-10 NOTE — ED Notes (Signed)
Pt refused for vitals to be rechecked.

## 2022-01-10 NOTE — ED Notes (Signed)
Pt given her 2 personal belonging's bag for discharge
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
## 2022-01-10 NOTE — ED Notes (Signed)
Pt woke upset. Pt upset about NT getting VS. Upset about being told that she is being d/c'd, and leaving, but doesn't know plan. EDP, CN, ED Dir at [facility] to update and answer questions. Plan, process, results, and disposition explained with rationale. Pending d/c.

## 2022-01-10 NOTE — ED Notes (Signed)
Pt yelling, swearing, beligerant, malingering.

## 2022-01-10 NOTE — ED Notes (Signed)
Pt will need to be reassess by psych in the am d/t TTS consult not completed d/t pt's somnolence stated after Seroquel and trazodone medication to help with pt sleep. Dr. Ayesha Rumpf notified

## 2022-01-10 NOTE — BH Assessment (Addendum)
At this time patient is too somnolent to participate in assessment.  Daytime provider to see patient.  Clinician informed Dr. Ralene Bathe and RN [facility]

## 2022-01-10 NOTE — Discharge Instructions (Signed)
Follow-up with your primary care provider and your psychiatrist for refills on any medications that are missing.  Substance Abuse Treatment Programs  Intensive Outpatient Programs [facility]     [address], [place]       [facility] [place] [address] [place], [place]  [facility]     (Inpatient and outpatient)     [address]           [place] [phone] (Suboxone and Methadone)  [place], [place] [address]      [place] [address] [place], [place]  [facility] (Outpatient/Inpatient, Chemical)    (insurance only) [phone]             [facility] ([place]) [place], [place]     [facility]     [address]     [place], [place]       [facility] (for caregivers and family) [phone] [address] Kristeen Mans. [place], [place]      Residential Treatment Programs [facility]      [address], [place], [place] [address]  [phone]       [facility] [address], [place], [place] [address] [phone]  [facility] [place]        [phone]       [facility] [phone]  [facility] ([place]             [place], [place] or [place], [address] [phone]  [facility] [place]    [address]      [place], [place]       [facility] [address] [place], [place]  [place]   [address] [place], [place]  [address]     [phone]      Admissions: 8am-3pm M-F  [facility] ([facility]) [address] [place], [place]  [facility] Program: Residential Program [phone] Days)   [place], [place] or [phone]     [facility]: [facility] [place], [place] (Walk in Hours over the weekend or by referral)  [facility] [place], [place],  [address] [phone]  Crisis Mobile: [facility]:  [phone] (for crisis response 24 hours a day)  [place] Hotline:      [phone] Outpatient Psychiatry and Counseling  [facility]: Mobile Crisis Management 24 hours:  [phone]  [facility] [employer] sliding scale fee and walk in schedule: M-F 8am-12pm/1pm-3pm [place], [place] [address] [address] [place], [place] [address] [phone]  [facility] (Formerly known as [facility] [employer])- new patient walk-in appointments available Monday - Friday 8am -3pm.          [address] [place], [place] [address] [phone] or crisis line- [place] [facility]/ Intensive Outpatient Therapy Program [place], [place] [address] [place]      [phone], [place] [address]                 [place]   [facility] [phone]. [place], [place] [address]   [employer] [facility]          [address] Johnette Abraham  [place], [place] [address]       [phone]  [place], [place], [place] [address] [phone]  [facility]    [address] [place], [place] [address]     [place],  [place] Joycelyn Man     [place] [address]     [phone]       [facility] [place] [address]  [facility]     [address], [place], [place], [place] [address] [place]     [address]     [place], [place] [address]     [phone]       [facility] [address] [place], [place] [address] [phone] Resources for Temporary Residential Assistance/Crisis [place] [facility] [place]) M-F 8am-3pm   [address], [place] [address]   [phone] Services include: laundry, barbering, support groups, case management, phone  & computer access, showers, AA/NA mtgs, mental health/substance abuse nurse, job skills class, disability information, VA assistance, spiritual classes, etc.   [facility]   [address] [place]     [place] (women and children)       [place]. [place], [place] [address] [phone] [email or web address] for application and process Application Required  [facility]   [address]    [place] [address]     [phone]                    [place],  [address] [phone]([email or web address] application appt.) Application Required  [place] (women only)    [address]  [place], [place] [address]     [phone]      Intake starts 6pm daily Need valid ID, SSC, & Police report [employer] [address] [place], [place] [phone] Application Required  [employer] (men only)     [place].      [place],  [place]       [place] (Pregnant women only) [address] [place], [place]  [facility]      [place] Dani [address].      [place], [place] [address]     [phone]             [facility] [address] [place], [place] 90 day commitment/SA/Application process  [facility](men only)     [address]     [place], [place]       Check-in at [facility] [address] [place], [place] [address] [phone] Men/Women/Women and Children must be there by 7 pm  [place], [place]
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
## 2022-12-07 ENCOUNTER — Ambulatory Visit (HOSPITAL_COMMUNITY)
Admission: EM | Admit: 2022-12-07 | Discharge: 2022-12-07 | Disposition: A | Discharge status: Home / Self Care | Payer: MEDICAID | Attending: Emergency Medicine | Admitting: Emergency Medicine

## 2022-12-07 ENCOUNTER — Encounter (HOSPITAL_COMMUNITY): Payer: Self-pay | Admitting: *Deleted

## 2022-12-07 DIAGNOSIS — L52 Erythema nodosum: Secondary | ICD-10-CM

## 2022-12-07 DIAGNOSIS — J4521 Mild intermittent asthma with (acute) exacerbation: Secondary | ICD-10-CM

## 2022-12-07 MED ORDER — PREDNISONE 20 MG PO TABS: 40.0000 mg | ORAL_TABLET | Freq: Every day | ORAL | 0 refills | Status: AC

## 2022-12-07 MED ORDER — IPRATROPIUM-ALBUTEROL 0.5-2.5 (3) MG/3ML IN SOLN
RESPIRATORY_TRACT | Status: AC
  Filled 2022-12-07: qty 3

## 2022-12-07 MED ORDER — IPRATROPIUM-ALBUTEROL 0.5-2.5 (3) MG/3ML IN SOLN
3.0000 mL | Freq: Once | RESPIRATORY_TRACT | Status: AC
  Administered 2022-12-07: 3 mL via RESPIRATORY_TRACT

## 2022-12-07 MED ORDER — ALBUTEROL SULFATE (2.5 MG/3ML) 0.083% IN NEBU: 2.5000 mg | INHALATION_SOLUTION | Freq: Four times a day (QID) | RESPIRATORY_TRACT | 0 refills | Status: AC | PRN

## 2022-12-07 MED ORDER — ALBUTEROL SULFATE HFA 108 (90 BASE) MCG/ACT IN AERS: 1.0000 | INHALATION_SPRAY | Freq: Four times a day (QID) | RESPIRATORY_TRACT | 0 refills | Status: DC | PRN

## 2022-12-07 MED ORDER — PROMETHAZINE-DM 6.25-15 MG/5ML PO SYRP: 5.0000 mL | ORAL_SOLUTION | Freq: Every evening | ORAL | 0 refills | Status: DC | PRN

## 2022-12-07 MED ORDER — BENZONATATE 100 MG PO CAPS: 100.0000 mg | ORAL_CAPSULE | Freq: Three times a day (TID) | ORAL | 0 refills | Status: DC

## 2022-12-07 NOTE — ED Triage Notes (Signed)
Pt states she has had cough and congestion x 3 weeks. She took an at home COVID test 3 days ago which was neg. She has been taking OTC meds without relief. She states she has an albuterol MDI she used last today.   She states she has bruises on her left lower leg X 4 weeks. She is worried that they are blood clots.

## 2022-12-07 NOTE — ED Provider Notes (Signed)
[facility]    CSN: [id number] Arrival date & time: 12/07/22  1543      History   Chief Complaint Chief Complaint  Patient presents with   Cough   Nasal Congestion   Bruises    HPI [name] is a 37 y.o. female.   Patient presents with cough and congestion x 3 weeks.  Reports taking over-the-counter medications without relief.  Reports history of asthma and using her inhaler with minimal relief.  Patient also reports noticing a couple bruises to her left lower leg x 1 month and she is concerned they are blood clots.  Denies any known injury.    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)    Hypertension    Kidney stone    Kidney stones    Ovarian cancer (HCC)    Schizoaffective disorder    Seizures (HCC)     Patient Active Problem List   Diagnosis Date Noted   Polysubstance abuse (HCC) 01/10/2022   Substance induced mood disorder (HCC) 01/10/2022   Biliary colic 07/09/2017   Tobacco use disorder 09/01/2015   Cannabis use disorder, moderate, dependence (HCC) 09/01/2015   OSA (obstructive sleep apnea) 10/07/2013   Upper airway cough syndrome 09/03/2013   Essential hypertension 09/03/2013   Schizoaffective disorder, depressive type (HCC) 02/22/2011    Past Surgical History:  Procedure Laterality Date   ABDOMINAL HYSTERECTOMY     partial; took fallopian tubes and ovaries   APPENDECTOMY     CHOLECYSTECTOMY N/A 07/09/2017   Procedure: LAPAROSCOPIC CHOLECYSTECTOMY;  Surgeon: Berna Bue, MD;  Location: [facility];  Service: General;  Laterality: N/A;   kidney stone removal      OB History     Gravida  0   Para  0   Term  0   Preterm  0   AB  0   Living   0      SAB  0   IAB  0   Ectopic  0   Multiple  0   Live Births               Home Medications    Prior to Admission medications   Medication Sig Start Date End Date Taking? Authorizing Provider  acetaminophen (TYLENOL) 325 MG tablet Take 650 mg by mouth every 6 (six) hours as needed for mild pain or headache.   Yes [provider]  albuterol (PROVENTIL) (2.5 MG/3ML) 0.083% nebulizer solution Take 3 mLs (2.5 mg total) by nebulization every 6 (six) hours as needed for wheezing or shortness of breath. 12/07/22  Yes Susann Givens, Lataria Courser A, NP  albuterol (VENTOLIN HFA) 108 (90 Base) MCG/ACT inhaler Inhale 1-2 puffs into the lungs every 6 (six) hours as needed for wheezing or shortness of breath. 12/07/22  Yes Susann Givens, Leopold Smyers A, NP  benzonatate (TESSALON) 100 MG capsule Take 1 capsule (100 mg total) by mouth every 8 (eight) hours. 12/07/22  Yes Susann Givens, Destini Cambre A, NP  predniSONE (DELTASONE) 20 MG tablet Take 2 tablets (40 mg total) by mouth daily for 5 days. 12/07/22 12/12/22 Yes Wynonia Lawman A, NP  promethazine-dextromethorphan (PROMETHAZINE-DM) 6.25-15 MG/5ML syrup  Take 5 mLs by mouth at bedtime as needed for cough. 12/07/22  Yes Susann Givens, Dajahnae Vondra A, NP  ALPRAZolam Prudy Feeler) 0.5 MG tablet Take 0.5 mg by mouth 3 (three) times daily. 12/08/19   [provider]  amphetamine-dextroamphetamine (ADDERALL) 5 MG tablet Take 1 tablet by mouth 2 (two) times daily. 12/08/19   [provider]  hydrochlorothiazide (HYDRODIURIL) 25 MG tablet Take 1 tablet (25 mg total) by mouth daily. 01/10/22   Ernie Avena, MD  hydrOXYzine (VISTARIL) 25 MG capsule Take 1 capsule (25 mg total) by mouth 2 (two) times daily as needed for anxiety. 01/10/22   Ernie Avena, MD  QUEtiapine (SEROQUEL) 100 MG tablet Take 1 tablet (100 mg total) by mouth at bedtime. 01/10/22 02/09/22  Ernie Avena, MD    Family History Family History  Problem Relation Age of Onset   Diabetes type II  Other    Asthma Mother     Social History Social History   Tobacco Use   Smoking status: Light Smoker    Current packs/day: 0.00    Average packs/day: 0.2 packs/day for 2.0 years (0.3 ttl pk-yrs)    Types: Cigarettes    Start date: 07/28/2010    Last attempt to quit: 07/27/2012    Years since quitting: 10.3   Smokeless tobacco: Never  Vaping Use   Vaping status: Never Used  Substance Use Topics   Alcohol use: No   Drug use: Yes    Frequency: 7.0 times per week    Types: Marijuana     Allergies   Ketorolac tromethamine, Latex, Lortab [hydrocodone-acetaminophen], and Tramadol   Review of Systems Review of Systems  Constitutional:  Negative for chills, fatigue and fever.  HENT:  Positive for congestion and rhinorrhea.   Respiratory:  Positive for cough, chest tightness and shortness of breath. Negative for wheezing.   Cardiovascular:  Positive for chest pain.  Gastrointestinal:  Negative for abdominal pain, diarrhea, nausea and vomiting.  Skin:  Positive for color change.     Physical Exam Triage Vital Signs ED Triage Vitals  Encounter Vitals Group     BP 12/07/22 1623 (!) 145/95     Systolic BP Percentile --      Diastolic BP Percentile --      Pulse Rate 12/07/22 1623 99     Resp 12/07/22 1623 (!) 28     Temp 12/07/22 1623 98.2 F (36.8 C)     Temp Source 12/07/22 1623 Oral     SpO2 12/07/22 1623 95 %     Weight --      Height --      Head Circumference --      Peak Flow --      Pain Score 12/07/22 1621 8     Pain Loc --      Pain Education --      Exclude from Growth Chart --    No data found.  Updated Vital Signs BP (!) 145/95 (BP Location: Left Arm)   Pulse 99   Temp 98.2 F (36.8 C) (Oral)   Resp (!) 28   SpO2 95%   Visual Acuity Right Eye Distance:   Left Eye Distance:   Bilateral Distance:    Right Eye Near:   Left Eye Near:    Bilateral Near:     Physical Exam Vitals and nursing note reviewed.  Constitutional:      General: She is  awake. She is not in acute distress.    Appearance: Normal appearance.  She is well-developed and well-groomed. She is not ill-appearing, toxic-appearing or diaphoretic.  HENT:     Right Ear: Tympanic membrane, ear canal and external ear normal.     Left Ear: Tympanic membrane, ear canal and external ear normal.     Nose: Congestion and rhinorrhea present.     Mouth/Throat:     Mouth: Mucous membranes are moist.     Pharynx: Posterior oropharyngeal erythema present. No oropharyngeal exudate.  Cardiovascular:     Rate and Rhythm: Normal rate.     Heart sounds: Normal heart sounds.  Pulmonary:     Effort: Pulmonary effort is normal. No respiratory distress.     Breath sounds: No stridor. Wheezing present. No rhonchi or rales.  Skin:    General: Skin is warm and dry.     Findings: Bruising present.     Comments: Two mildly tender nodular marble-sized bruised areas to anterior left lower leg.  Neurological:     Mental Status: She is alert.  Psychiatric:        Behavior: Behavior is cooperative.      UC Treatments / Results  Labs (all labs ordered are listed, but only abnormal results are displayed) Labs Reviewed - No data to display  EKG   Radiology No results found.  Procedures Procedures (including critical care time)  Medications Ordered in UC Medications  ipratropium-albuterol (DUONEB) 0.5-2.5 (3) MG/3ML nebulizer solution 3 mL (3 mLs Nebulization Given 12/07/22 1713)    Initial Impression / Assessment and Plan / UC Course  I have reviewed the triage vital signs and the nursing notes.  Pertinent labs & imaging results that were available during my care of the patient were reviewed by me and considered in my medical decision making (see chart for details).     Patient presented with cough, congestion, and shortness of breath x 3 weeks.  History of asthma.  Patient reports using inhaler with minimal relief.  Patient also reports noticing a couple bruises to her  left lower leg x 1 month and is concerned about blood clots.  Denies any known injury.  Upon assessment mild erythema noted to oropharynx, congestion and rhinorrhea present.  Expiratory wheezing noted throughout lung fields.  Given DuoNeb in clinic with resolution of wheezing.  Noted to have mildly tender nodular marble-sized bruised areas to anterior left lower leg.  Recommended following up with rheumatology as this could be an early sign of autoimmune disorder.  Prescribed refill of albuterol inhaler and albuterol nebulizers to use as needed for shortness of breath, chest tightness, and wheezing.  Prescribed Tessalon and Promethazine DM for cough.  Prescribed short course of steroids.  Discussed follow-up, return, strict ER precautions. Final Clinical Impressions(s) / UC Diagnoses   Final diagnoses:  Mild intermittent asthma with acute exacerbation  Erythema nodosum     Discharge Instructions      Start taking prednisone once daily for 5 days.  You can use your albuterol inhaler or albuterol nebulizers as needed for shortness of breath, chest tightness, or wheezing.  You can take Tessalon every 8 hours as needed for cough, and promethazine cough syrup at night as needed for cough.  The cough syrup can make you drowsy so do not drive, work or drink alcohol while taking. Also, I have attached a rheumatologist by the name of Dr. Sheliah Hatch to follow-up with regarding further evaluation of bruising on your leg.  You may need to follow-up with your primary care doctor in order to get a  referral to a rheumatologist.  Return here as needed.  If you develop trouble breathing, severe chest pain, or fevers unrelieved by medication please seek immediate medical treatment in the ER.    ED Prescriptions     Medication Sig Dispense Auth. Provider   albuterol (PROVENTIL) (2.5 MG/3ML) 0.083% nebulizer solution Take 3 mLs (2.5 mg total) by nebulization every 6 (six) hours as needed for wheezing or  shortness of breath. 75 mL Susann Givens, Saket Hellstrom A, NP   predniSONE (DELTASONE) 20 MG tablet Take 2 tablets (40 mg total) by mouth daily for 5 days. 10 tablet Wynonia Lawman A, NP   benzonatate (TESSALON) 100 MG capsule Take 1 capsule (100 mg total) by mouth every 8 (eight) hours. 21 capsule Wynonia Lawman A, NP   promethazine-dextromethorphan (PROMETHAZINE-DM) 6.25-15 MG/5ML syrup Take 5 mLs by mouth at bedtime as needed for cough. 118 mL Wynonia Lawman A, NP   albuterol (VENTOLIN HFA) 108 (90 Base) MCG/ACT inhaler Inhale 1-2 puffs into the lungs every 6 (six) hours as needed for wheezing or shortness of breath. 6.7 g Wynonia Lawman A, NP      PDMP not reviewed this encounter.   Wynonia Lawman A, NP 12/07/22 1940

## 2022-12-07 NOTE — Discharge Instructions (Addendum)
Start taking prednisone once daily for 5 days.  You can use your albuterol inhaler or albuterol nebulizers as needed for shortness of breath, chest tightness, or wheezing.  You can take Tessalon every 8 hours as needed for cough, and promethazine cough syrup at night as needed for cough.  The cough syrup can make you drowsy so do not drive, work or drink alcohol while taking. Also, I have attached a rheumatologist by the name of Dr. Sheliah Hatch to follow-up with regarding further evaluation of bruising on your leg.  You may need to follow-up with your primary care doctor in order to get a referral to a rheumatologist.  Return here as needed.  If you develop trouble breathing, severe chest pain, or fevers unrelieved by medication please seek immediate medical treatment in the ER.

## 2023-01-28 ENCOUNTER — Observation Stay (HOSPITAL_COMMUNITY)
Admission: EM | Admit: 2023-01-28 | Discharge: 2023-01-31 | Disposition: A | Discharge status: Home / Self Care | Payer: MEDICAID | Attending: Internal Medicine | Admitting: Internal Medicine

## 2023-01-28 ENCOUNTER — Emergency Department (HOSPITAL_COMMUNITY): Payer: MEDICAID

## 2023-01-28 DIAGNOSIS — Z6841 Body Mass Index (BMI) 40.0 and over, adult: Secondary | ICD-10-CM

## 2023-01-28 DIAGNOSIS — I10 Essential (primary) hypertension: Secondary | ICD-10-CM | POA: Diagnosis not present

## 2023-01-28 DIAGNOSIS — Z79891 Long term (current) use of opiate analgesic: Secondary | ICD-10-CM | POA: Insufficient documentation

## 2023-01-28 DIAGNOSIS — G4730 Sleep apnea, unspecified: Secondary | ICD-10-CM | POA: Diagnosis present

## 2023-01-28 DIAGNOSIS — Z79899 Other long term (current) drug therapy: Secondary | ICD-10-CM

## 2023-01-28 DIAGNOSIS — E669 Obesity, unspecified: Secondary | ICD-10-CM | POA: Insufficient documentation

## 2023-01-28 DIAGNOSIS — F17210 Nicotine dependence, cigarettes, uncomplicated: Secondary | ICD-10-CM | POA: Diagnosis present

## 2023-01-28 DIAGNOSIS — R29705 NIHSS score 5: Secondary | ICD-10-CM | POA: Diagnosis present

## 2023-01-28 DIAGNOSIS — Z885 Allergy status to narcotic agent status: Secondary | ICD-10-CM

## 2023-01-28 DIAGNOSIS — F1210 Cannabis abuse, uncomplicated: Secondary | ICD-10-CM | POA: Diagnosis present

## 2023-01-28 DIAGNOSIS — R569 Unspecified convulsions: Principal | ICD-10-CM

## 2023-01-28 DIAGNOSIS — G8194 Hemiplegia, unspecified affecting left nondominant side: Secondary | ICD-10-CM | POA: Diagnosis not present

## 2023-01-28 DIAGNOSIS — R4781 Slurred speech: Secondary | ICD-10-CM | POA: Diagnosis not present

## 2023-01-28 DIAGNOSIS — E876 Hypokalemia: Secondary | ICD-10-CM | POA: Diagnosis not present

## 2023-01-28 DIAGNOSIS — Z765 Malingerer [conscious simulation]: Secondary | ICD-10-CM

## 2023-01-28 DIAGNOSIS — J029 Acute pharyngitis, unspecified: Secondary | ICD-10-CM | POA: Insufficient documentation

## 2023-01-28 DIAGNOSIS — F251 Schizoaffective disorder, depressive type: Secondary | ICD-10-CM | POA: Diagnosis present

## 2023-01-28 DIAGNOSIS — Z91040 Latex allergy status: Secondary | ICD-10-CM

## 2023-01-28 DIAGNOSIS — Z886 Allergy status to analgesic agent status: Secondary | ICD-10-CM

## 2023-01-28 DIAGNOSIS — F1410 Cocaine abuse, uncomplicated: Secondary | ICD-10-CM | POA: Diagnosis present

## 2023-01-28 DIAGNOSIS — G8929 Other chronic pain: Secondary | ICD-10-CM | POA: Diagnosis present

## 2023-01-28 DIAGNOSIS — G40409 Other generalized epilepsy and epileptic syndromes, not intractable, without status epilepticus: Principal | ICD-10-CM | POA: Diagnosis present

## 2023-01-28 LAB — I-STAT CHEM 8, ED
BUN: 22 mg/dL — ABNORMAL HIGH (ref 6–20)
Calcium, Ion: 1.02 mmol/L — ABNORMAL LOW (ref 1.15–1.40)
Chloride: 104 mmol/L (ref 98–111)
Creatinine, Ser: 1.2 mg/dL — ABNORMAL HIGH (ref 0.44–1.00)
Glucose, Bld: 132 mg/dL — ABNORMAL HIGH (ref 70–99)
HCT: 38 % (ref 36.0–46.0)
Hemoglobin: 12.9 g/dL (ref 12.0–15.0)
Potassium: 3.8 mmol/L (ref 3.5–5.1)
Sodium: 141 mmol/L (ref 135–145)
TCO2: 28 mmol/L (ref 22–32)

## 2023-01-28 LAB — CBG MONITORING, ED: Glucose-Capillary: 125 mg/dL — ABNORMAL HIGH (ref 70–99)
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
## 2023-01-29 ENCOUNTER — Emergency Department (HOSPITAL_COMMUNITY): Payer: MEDICAID

## 2023-01-29 ENCOUNTER — Other Ambulatory Visit: Payer: Self-pay

## 2023-01-29 ENCOUNTER — Other Ambulatory Visit (HOSPITAL_COMMUNITY): Payer: MEDICAID

## 2023-01-29 ENCOUNTER — Encounter (HOSPITAL_COMMUNITY): Payer: Self-pay | Admitting: Emergency Medicine

## 2023-01-29 ENCOUNTER — Observation Stay (HOSPITAL_COMMUNITY): Payer: MEDICAID

## 2023-01-29 DIAGNOSIS — R569 Unspecified convulsions: Secondary | ICD-10-CM

## 2023-01-29 LAB — URINALYSIS, ROUTINE W REFLEX MICROSCOPIC
Bilirubin Urine: NEGATIVE
Glucose, UA: NEGATIVE mg/dL
Ketones, ur: NEGATIVE mg/dL
Nitrite: NEGATIVE
Protein, ur: NEGATIVE mg/dL
Specific Gravity, Urine: 1.025 (ref 1.005–1.030)
pH: 5 (ref 5.0–8.0)

## 2023-01-29 LAB — COMPREHENSIVE METABOLIC PANEL
ALT: 13 U/L (ref 0–44)
ALT: 16 U/L (ref 0–44)
AST: 12 U/L — ABNORMAL LOW (ref 15–41)
AST: 21 U/L (ref 15–41)
Albumin: 2.9 g/dL — ABNORMAL LOW (ref 3.5–5.0)
Albumin: 3.5 g/dL (ref 3.5–5.0)
Alkaline Phosphatase: 62 U/L (ref 38–126)
Alkaline Phosphatase: 83 U/L (ref 38–126)
Anion gap: 11 (ref 5–15)
Anion gap: 9 (ref 5–15)
BUN: 13 mg/dL (ref 6–20)
BUN: 19 mg/dL (ref 6–20)
CO2: 21 mmol/L — ABNORMAL LOW (ref 22–32)
CO2: 24 mmol/L (ref 22–32)
Calcium: 7.5 mg/dL — ABNORMAL LOW (ref 8.9–10.3)
Calcium: 9.1 mg/dL (ref 8.9–10.3)
Chloride: 106 mmol/L (ref 98–111)
Chloride: 112 mmol/L — ABNORMAL HIGH (ref 98–111)
Creatinine, Ser: 0.84 mg/dL (ref 0.44–1.00)
Creatinine, Ser: 1.13 mg/dL — ABNORMAL HIGH (ref 0.44–1.00)
GFR, Estimated: >60 mL/min (ref >60)
GFR, Estimated: >60 mL/min (ref >60)
Glucose, Bld: 108 mg/dL — ABNORMAL HIGH (ref 70–99)
Glucose, Bld: 126 mg/dL — ABNORMAL HIGH (ref 70–99)
Potassium: 3.1 mmol/L — ABNORMAL LOW (ref 3.5–5.1)
Potassium: 4 mmol/L (ref 3.5–5.1)
Sodium: 141 mmol/L (ref 135–145)
Sodium: 142 mmol/L (ref 135–145)
Total Bilirubin: 0.3 mg/dL (ref <1.2)
Total Bilirubin: 0.6 mg/dL (ref <1.2)
Total Protein: 5.8 g/dL — ABNORMAL LOW (ref 6.5–8.1)
Total Protein: 7.3 g/dL (ref 6.5–8.1)

## 2023-01-29 LAB — RESPIRATORY PANEL BY PCR

## 2023-01-29 LAB — CBC WITH DIFFERENTIAL/PLATELET
Abs Immature Granulocytes: 0.04 10*3/uL (ref 0.00–0.07)
Basophils Absolute: 0 10*3/uL (ref 0.0–0.1)
Basophils Relative: 0 %
Eosinophils Absolute: 0.1 10*3/uL (ref 0.0–0.5)
Eosinophils Relative: 1 %
HCT: 38.3 % (ref 36.0–46.0)
Hemoglobin: 11.2 g/dL — ABNORMAL LOW (ref 12.0–15.0)
Immature Granulocytes: 0 %
Lymphocytes Relative: 30 %
Lymphs Abs: 2.8 10*3/uL (ref 0.7–4.0)
MCH: 30.1 pg (ref 26.0–34.0)
MCHC: 29.2 g/dL — ABNORMAL LOW (ref 30.0–36.0)
MCV: 103 fL — ABNORMAL HIGH (ref 80.0–100.0)
Monocytes Absolute: 0.6 10*3/uL (ref 0.1–1.0)
Monocytes Relative: 7 %
Neutro Abs: 5.7 10*3/uL (ref 1.7–7.7)
Neutrophils Relative %: 62 %
Platelets: 282 10*3/uL (ref 150–400)
RBC: 3.72 MIL/uL — ABNORMAL LOW (ref 3.87–5.11)
RDW: 12.9 % (ref 11.5–15.5)
WBC: 9.3 10*3/uL (ref 4.0–10.5)
nRBC: 0 % (ref 0.0–0.2)

## 2023-01-29 LAB — PROTIME-INR
INR: 1 (ref 0.8–1.2)
Prothrombin Time: 13.6 s (ref 11.4–15.2)

## 2023-01-29 LAB — HIV ANTIBODY (ROUTINE TESTING W REFLEX): HIV Screen 4th Generation wRfx: NONREACTIVE

## 2023-01-29 LAB — BLOOD GAS, VENOUS
Acid-Base Excess: 1.7 mmol/L (ref 0.0–2.0)
Bicarbonate: 28.5 mmol/L — ABNORMAL HIGH (ref 20.0–28.0)
O2 Saturation: 94.5 %
Patient temperature: 37
pCO2, Ven: 54 mm[Hg] (ref 44–60)
pH, Ven: 7.33 (ref 7.25–7.43)
pO2, Ven: 69 mm[Hg] — ABNORMAL HIGH (ref 32–45)

## 2023-01-29 LAB — DIFFERENTIAL
Abs Immature Granulocytes: 0.03 10*3/uL (ref 0.00–0.07)
Basophils Absolute: 0 10*3/uL (ref 0.0–0.1)
Basophils Relative: 0 %
Eosinophils Absolute: 0.1 10*3/uL (ref 0.0–0.5)
Eosinophils Relative: 1 %
Immature Granulocytes: 0 %
Lymphocytes Relative: 27 %
Lymphs Abs: 2.8 10*3/uL (ref 0.7–4.0)
Monocytes Absolute: 0.7 10*3/uL (ref 0.1–1.0)
Monocytes Relative: 6 %
Neutro Abs: 7 10*3/uL (ref 1.7–7.7)
Neutrophils Relative %: 66 %

## 2023-01-29 LAB — CBC
HCT: 35.8 % — ABNORMAL LOW (ref 36.0–46.0)
HCT: 35.8 % — ABNORMAL LOW (ref 36.0–46.0)
Hemoglobin: 11.3 g/dL — ABNORMAL LOW (ref 12.0–15.0)
Hemoglobin: 11 g/dL — ABNORMAL LOW (ref 12.0–15.0)
MCH: 30.5 pg (ref 26.0–34.0)
MCH: 29.6 pg (ref 26.0–34.0)
MCHC: 31.6 g/dL (ref 30.0–36.0)
MCHC: 30.7 g/dL (ref 30.0–36.0)
MCV: 96.5 fL (ref 80.0–100.0)
MCV: 96.5 fL (ref 80.0–100.0)
Platelets: 348 10*3/uL (ref 150–400)
Platelets: 350 10*3/uL (ref 150–400)
RBC: 3.71 MIL/uL — ABNORMAL LOW (ref 3.87–5.11)
RBC: 3.71 MIL/uL — ABNORMAL LOW (ref 3.87–5.11)
RDW: 12.8 % (ref 11.5–15.5)
RDW: 12.9 % (ref 11.5–15.5)
WBC: 10.6 10*3/uL — ABNORMAL HIGH (ref 4.0–10.5)
WBC: 9.2 10*3/uL (ref 4.0–10.5)
nRBC: 0 % (ref 0.0–0.2)
nRBC: 0 % (ref 0.0–0.2)

## 2023-01-29 LAB — CK: Total CK: 293 U/L — ABNORMAL HIGH (ref 38–234)

## 2023-01-29 LAB — RAPID URINE DRUG SCREEN, HOSP PERFORMED
Amphetamines: NOT DETECTED
Barbiturates: NOT DETECTED
Benzodiazepines: NOT DETECTED
Cocaine: POSITIVE — AB
Opiates: NOT DETECTED
Tetrahydrocannabinol: POSITIVE — AB

## 2023-01-29 LAB — CREATININE, SERUM
Creatinine, Ser: 1.29 mg/dL — ABNORMAL HIGH (ref 0.44–1.00)
GFR, Estimated: 54 mL/min — ABNORMAL LOW (ref >60)

## 2023-01-29 LAB — ETHANOL: Alcohol, Ethyl (B): <10 mg/dL (ref <10)

## 2023-01-29 LAB — APTT: aPTT: 23 s — ABNORMAL LOW (ref 24–36)

## 2023-01-29 LAB — CBG MONITORING, ED: Glucose-Capillary: 127 mg/dL — ABNORMAL HIGH (ref 70–99)

## 2023-01-29 LAB — SARS CORONAVIRUS 2 BY RT PCR: SARS Coronavirus 2 by RT PCR: NEGATIVE

## 2023-01-29 LAB — MAGNESIUM: Magnesium: 1.8 mg/dL (ref 1.7–2.4)

## 2023-01-29 LAB — HEMOGLOBIN A1C
Hgb A1c MFr Bld: 5.4 % (ref 4.8–5.6)
Mean Plasma Glucose: 108.28 mg/dL

## 2023-01-29 MED ORDER — ENOXAPARIN SODIUM 40 MG/0.4ML IJ SOSY
40.0000 mg | PREFILLED_SYRINGE | INTRAMUSCULAR | Status: DC
Start: 2023-01-29 — End: 2023-01-31
  Filled 2023-01-29: qty 0.4

## 2023-01-29 MED ORDER — HALOPERIDOL LACTATE 5 MG/ML IJ SOLN: 5.0000 mg | Freq: Once | INTRAMUSCULAR | Status: DC | PRN

## 2023-01-29 MED ORDER — SODIUM CHLORIDE 0.9% FLUSH
3.0000 mL | Freq: Two times a day (BID) | INTRAVENOUS | Status: DC
  Administered 2023-01-29 – 2023-01-30 (×3): 3 mL via INTRAVENOUS

## 2023-01-29 MED ORDER — POTASSIUM CHLORIDE 20 MEQ PO PACK: 40.0000 meq | PACK | Freq: Every day | ORAL | Status: DC

## 2023-01-29 MED ORDER — POTASSIUM CHLORIDE IN NACL 20-0.9 MEQ/L-% IV SOLN
INTRAVENOUS | Status: DC
  Filled 2023-01-29 (×3): qty 1000

## 2023-01-29 MED ORDER — ATORVASTATIN CALCIUM 40 MG PO TABS: 80.0000 mg | ORAL_TABLET | Freq: Every day | ORAL | Status: DC

## 2023-01-29 MED ORDER — OXYCODONE-ACETAMINOPHEN 5-325 MG PO TABS
2.0000 | ORAL_TABLET | ORAL | Status: DC | PRN
  Administered 2023-01-29 – 2023-01-30 (×4): 2 via ORAL
  Filled 2023-01-29 (×4): qty 2

## 2023-01-29 MED ORDER — ONDANSETRON HCL 4 MG PO TABS: 4.0000 mg | ORAL_TABLET | Freq: Four times a day (QID) | ORAL | Status: DC | PRN

## 2023-01-29 MED ORDER — LORAZEPAM 2 MG/ML IJ SOLN
1.0000 mg | Freq: Once | INTRAMUSCULAR | Status: AC | PRN
  Administered 2023-01-29: 1 mg via INTRAVENOUS
  Filled 2023-01-29: qty 1

## 2023-01-29 MED ORDER — HYDROXYZINE HCL 25 MG PO TABS: 25.0000 mg | ORAL_TABLET | Freq: Two times a day (BID) | ORAL | Status: DC | PRN

## 2023-01-29 MED ORDER — KCL IN DEXTROSE-NACL 40-5-0.9 MEQ/L-%-% IV SOLN
INTRAVENOUS | Status: DC
  Filled 2023-01-29: qty 1000

## 2023-01-29 MED ORDER — HYDROXYZINE PAMOATE 25 MG PO CAPS
25.0000 mg | ORAL_CAPSULE | Freq: Two times a day (BID) | ORAL | Status: DC | PRN
  Filled 2023-01-29: qty 1

## 2023-01-29 MED ORDER — ONDANSETRON HCL 4 MG/2ML IJ SOLN: 4.0000 mg | Freq: Four times a day (QID) | INTRAMUSCULAR | Status: DC | PRN

## 2023-01-29 MED ORDER — LORAZEPAM 2 MG/ML IJ SOLN
1.0000 mg | Freq: Once | INTRAMUSCULAR | Status: AC
  Administered 2023-01-29: 1 mg via INTRAVENOUS
  Filled 2023-01-29: qty 1

## 2023-01-29 MED ORDER — HYDROMORPHONE HCL 1 MG/ML IJ SOLN
0.5000 mg | Freq: Four times a day (QID) | INTRAMUSCULAR | Status: DC | PRN
  Administered 2023-01-29 – 2023-01-30 (×4): 0.5 mg via INTRAVENOUS
  Filled 2023-01-29: qty 0.5
  Filled 2023-01-29 (×3): qty 1

## 2023-01-29 MED ORDER — AMPHETAMINE-DEXTROAMPHETAMINE 5 MG PO TABS: 5.0000 mg | ORAL_TABLET | Freq: Two times a day (BID) | ORAL | Status: DC

## 2023-01-29 NOTE — Code Documentation (Signed)
Stroke Response Nurse Documentation Code Documentation  [name] is a 37 y.o. female arriving to [facility]  via [facility] on 12/3 with past medical hx of seizures, schizoaffective disorder, depression. On No antithrombotic. Code stroke was activated by EMS.   [facility] where she was LKW at 1530 and now complaining of left sided weakness and speech difficulty .   Stroke team at the bedside on patient arrival. Labs drawn and patient cleared for CT by Dr. Clayborne Dana. Patient to CT with team. NIHSS 5, see documentation for details and code stroke times. Patient with disoriented, bilateral leg weakness, Expressive aphasia , and dysarthria  on exam. The following imaging was completed:  CT Head. Patient is not a candidate for IV Thrombolytic due to outside of window. Patient is not a candidate for IR due to low suspicion of LVO.   Care Plan: Neuro checks q2 hrs.   Bedside handoff with ED RN Morrie Sheldon.    Rose Fillers  Rapid Response RN

## 2023-01-29 NOTE — ED Notes (Signed)
Pt will not allow me to draw labs, nurse notified.

## 2023-01-29 NOTE — ED Notes (Signed)
Pt repeatedly removing oxygen mask

## 2023-01-29 NOTE — Progress Notes (Deleted)
LTM EEG hooked up and running - no initial skin breakdown - push button tested - [facility] monitoring.  

## 2023-01-29 NOTE — ED Notes (Signed)
Pt. On cardiac monitor . CCMD notified.
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
## 2023-01-29 NOTE — Progress Notes (Signed)
LTM EEG hooked up and running - no initial skin breakdown - push button tested - No [facility] monitoring. Patient in ED.

## 2023-01-29 NOTE — ED Notes (Signed)
 CCMD contacted for cardiac monitoring.

## 2023-01-29 NOTE — ED Notes (Signed)
Patient refuse to be placed on cardiac monitor at 1800, until she speaks to a doctor. Patient was told the PCP will talk to her in the morning and Neurology will read her EEG.

## 2023-01-29 NOTE — ED Notes (Signed)
Pt refused labs rn aware at

## 2023-01-29 NOTE — Progress Notes (Signed)
[facility] night cross cover note:   I was notified by RN of the patient's diminished responsiveness, increased NIH.   37 year old female who is being admitted with seizures, status post administration of Ativan.  Neurology has been consulted, and the patient is undergoing continuous EEG monitoring at this time.  Suspect diminished responsiveness as a consequence of her postictal state concomitant with recent Ativan. MRI brain showed no evidence of acute process, including no evidence of acute stroke.   Additionally, I have ordered continuous pulse oximetry as well as additional morning labs that include CMP, CBC, magnesium level, CPK, and VBG.     Newton Pigg, DO Hospitalist
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
## 2023-01-29 NOTE — Progress Notes (Signed)
Tech not able to place EEG leads at this moment, The pt is too reactive for this tech alone to place the leads.  I will inform the day shift .

## 2023-01-29 NOTE — ED Notes (Signed)
Pt refusing labs

## 2023-01-29 NOTE — ED Notes (Signed)
Pt is refusing to have the monitoring leads and BP cuff on. Provider made aware. Pt also refusing to have fluids started. Is saying she wants to go home. NP Rennis Harding made aware and she is on the way to bedside.
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
## 2023-01-29 NOTE — ED Notes (Signed)
Patient transported to MRI 
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
## 2023-01-29 NOTE — ED Notes (Signed)
This RN attempted to contact EEG tech due to pt's need to transport to floor.  No answer at this time.

## 2023-01-29 NOTE — ED Notes (Signed)
Pt had BM on bedside commode. Stand-by assist only to assist with wires.

## 2023-01-29 NOTE — ED Notes (Signed)
Pt has ripped all her cardiac monitor leads off. Is refusing to be on the monitor. NP Rennis Harding coming to bedside.

## 2023-01-29 NOTE — Progress Notes (Signed)
RN giving patient Haldol to settle her. Will check back in later.

## 2023-01-30 DIAGNOSIS — R569 Unspecified convulsions: Secondary | ICD-10-CM | POA: Diagnosis not present

## 2023-01-30 LAB — COMPREHENSIVE METABOLIC PANEL
ALT: 15 U/L (ref 0–44)
AST: 15 U/L (ref 15–41)
Albumin: 3.2 g/dL — ABNORMAL LOW (ref 3.5–5.0)
Alkaline Phosphatase: 72 U/L (ref 38–126)
Anion gap: 9 (ref 5–15)
BUN: 12 mg/dL (ref 6–20)
CO2: 24 mmol/L (ref 22–32)
Calcium: 8.2 mg/dL — ABNORMAL LOW (ref 8.9–10.3)
Chloride: 104 mmol/L (ref 98–111)
Creatinine, Ser: 1.03 mg/dL — ABNORMAL HIGH (ref 0.44–1.00)
GFR, Estimated: >60 mL/min (ref >60)
Glucose, Bld: 166 mg/dL — ABNORMAL HIGH (ref 70–99)
Potassium: 3.8 mmol/L (ref 3.5–5.1)
Sodium: 137 mmol/L (ref 135–145)
Total Bilirubin: 0.4 mg/dL (ref <1.2)
Total Protein: 6.6 g/dL (ref 6.5–8.1)

## 2023-01-30 LAB — CBC
HCT: 34.9 % — ABNORMAL LOW (ref 36.0–46.0)
Hemoglobin: 10.5 g/dL — ABNORMAL LOW (ref 12.0–15.0)
MCH: 29.7 pg (ref 26.0–34.0)
MCHC: 30.1 g/dL (ref 30.0–36.0)
MCV: 98.6 fL (ref 80.0–100.0)
Platelets: 302 10*3/uL (ref 150–400)
RBC: 3.54 MIL/uL — ABNORMAL LOW (ref 3.87–5.11)
RDW: 12.6 % (ref 11.5–15.5)
WBC: 6.8 10*3/uL (ref 4.0–10.5)
nRBC: 0 % (ref 0.0–0.2)

## 2023-01-30 LAB — CK: Total CK: 237 U/L — ABNORMAL HIGH (ref 38–234)

## 2023-01-30 LAB — MAGNESIUM: Magnesium: 1.9 mg/dL (ref 1.7–2.4)

## 2023-01-30 MED ORDER — POTASSIUM CHLORIDE CRYS ER 20 MEQ PO TBCR
20.0000 meq | EXTENDED_RELEASE_TABLET | Freq: Once | ORAL | Status: AC
  Administered 2023-01-30: 20 meq via ORAL
  Filled 2023-01-30: qty 1

## 2023-01-30 MED ORDER — POTASSIUM CHLORIDE IN NACL 20-0.9 MEQ/L-% IV SOLN
INTRAVENOUS | Status: DC
  Filled 2023-01-30: qty 1000

## 2023-01-30 MED ORDER — ALUM & MAG HYDROXIDE-SIMETH 200-200-20 MG/5ML PO SUSP
30.0000 mL | ORAL | Status: DC | PRN
  Administered 2023-01-30: 30 mL via ORAL
  Filled 2023-01-30: qty 30

## 2023-01-30 MED ORDER — AMLODIPINE BESYLATE 10 MG PO TABS
10.0000 mg | ORAL_TABLET | Freq: Every day | ORAL | Status: DC
  Administered 2023-01-30 – 2023-01-31 (×2): 10 mg via ORAL
  Filled 2023-01-30 (×2): qty 1

## 2023-01-30 MED ORDER — SODIUM CHLORIDE 0.9 % IV SOLN: 25.0000 mg | Freq: Four times a day (QID) | INTRAVENOUS | Status: DC | PRN

## 2023-01-30 MED ORDER — DIPHENHYDRAMINE HCL 50 MG/ML IJ SOLN
25.0000 mg | Freq: Four times a day (QID) | INTRAMUSCULAR | Status: DC | PRN
  Administered 2023-01-30 – 2023-01-31 (×2): 25 mg via INTRAVENOUS
  Filled 2023-01-30 (×2): qty 1

## 2023-01-30 MED ORDER — LABETALOL HCL 5 MG/ML IV SOLN
10.0000 mg | INTRAVENOUS | Status: DC | PRN
  Administered 2023-01-30 – 2023-01-31 (×4): 10 mg via INTRAVENOUS
  Filled 2023-01-30 (×4): qty 4

## 2023-01-30 MED ORDER — MENTHOL 3 MG MT LOZG
1.0000 | LOZENGE | OROMUCOSAL | Status: DC | PRN
  Administered 2023-01-30 – 2023-01-31 (×3): 3 mg via ORAL
  Filled 2023-01-30 (×3): qty 9

## 2023-01-30 MED ORDER — HYDRALAZINE HCL 20 MG/ML IJ SOLN
10.0000 mg | Freq: Four times a day (QID) | INTRAMUSCULAR | Status: DC | PRN
  Administered 2023-01-30: 10 mg via INTRAVENOUS
  Filled 2023-01-30: qty 1

## 2023-01-30 NOTE — Plan of Care (Signed)

## 2023-01-30 NOTE — Progress Notes (Deleted)
vLTM discontinued  No skin breakdown noted at all skin sites  [facility] notified
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
## 2023-01-30 NOTE — Progress Notes (Signed)
LTM EEG discontinued - no skin breakdown at unhook.   

## 2023-01-30 NOTE — ED Notes (Signed)
This RN introduced self to pt follow shift change.  Pt endorses variety of complaints and wanting updates.  This RN explained current plan of care and answered questions as able.  Pt inquiring about pain medication.  This RN to check on PRNs.

## 2023-01-30 NOTE — Progress Notes (Addendum)
LTM maint complete - no skin breakdown seen. Pt c/o forehead itchy, tech will bring skin savers to place on forehead. [facility] monitored, Event button test confirmed by [facility].
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
## 2023-01-30 NOTE — Progress Notes (Signed)
TRH night cross cover note:   I was notified by RN of the patient's updated blood pressure 172/97, with heart rates in the 90s.  She is currently resting comfortably, without any seizure-like activity noted.  I subsequently ordered prn IV labetalol for systolic blood pressure greater than 170 or diastolic blood pressure greater than 100 mmHg.    Newton Pigg, DO Hospitalist

## 2023-01-30 NOTE — Progress Notes (Signed)
vLTM maintenance  All impedances below 10k.  No skin breakdown noted at FP1 FP1  C3  F4

## 2023-01-30 NOTE — Plan of Care (Signed)

## 2023-01-31 ENCOUNTER — Other Ambulatory Visit (HOSPITAL_COMMUNITY): Payer: Self-pay

## 2023-01-31 DIAGNOSIS — I10 Essential (primary) hypertension: Secondary | ICD-10-CM | POA: Diagnosis present

## 2023-01-31 DIAGNOSIS — E876 Hypokalemia: Secondary | ICD-10-CM | POA: Diagnosis present

## 2023-01-31 DIAGNOSIS — R569 Unspecified convulsions: Secondary | ICD-10-CM | POA: Diagnosis not present

## 2023-01-31 DIAGNOSIS — Z6841 Body Mass Index (BMI) 40.0 and over, adult: Secondary | ICD-10-CM | POA: Diagnosis not present

## 2023-01-31 DIAGNOSIS — Z886 Allergy status to analgesic agent status: Secondary | ICD-10-CM | POA: Diagnosis not present

## 2023-01-31 DIAGNOSIS — G8929 Other chronic pain: Secondary | ICD-10-CM | POA: Diagnosis present

## 2023-01-31 DIAGNOSIS — R4781 Slurred speech: Secondary | ICD-10-CM | POA: Diagnosis present

## 2023-01-31 DIAGNOSIS — G4730 Sleep apnea, unspecified: Secondary | ICD-10-CM | POA: Diagnosis present

## 2023-01-31 DIAGNOSIS — Z885 Allergy status to narcotic agent status: Secondary | ICD-10-CM | POA: Diagnosis not present

## 2023-01-31 DIAGNOSIS — F251 Schizoaffective disorder, depressive type: Secondary | ICD-10-CM | POA: Diagnosis present

## 2023-01-31 DIAGNOSIS — R29705 NIHSS score 5: Secondary | ICD-10-CM | POA: Diagnosis present

## 2023-01-31 DIAGNOSIS — F1210 Cannabis abuse, uncomplicated: Secondary | ICD-10-CM | POA: Diagnosis present

## 2023-01-31 DIAGNOSIS — F17210 Nicotine dependence, cigarettes, uncomplicated: Secondary | ICD-10-CM | POA: Diagnosis present

## 2023-01-31 DIAGNOSIS — F1410 Cocaine abuse, uncomplicated: Secondary | ICD-10-CM | POA: Diagnosis present

## 2023-01-31 DIAGNOSIS — Z765 Malingerer [conscious simulation]: Secondary | ICD-10-CM | POA: Diagnosis not present

## 2023-01-31 DIAGNOSIS — Z91040 Latex allergy status: Secondary | ICD-10-CM | POA: Diagnosis not present

## 2023-01-31 DIAGNOSIS — Z79899 Other long term (current) drug therapy: Secondary | ICD-10-CM | POA: Diagnosis not present

## 2023-01-31 LAB — URINE CULTURE

## 2023-01-31 MED ORDER — AMLODIPINE BESYLATE 10 MG PO TABS
10.0000 mg | ORAL_TABLET | Freq: Every day | ORAL | 0 refills | Status: AC
  Filled 2023-01-31: qty 30, 30d supply, fill #0

## 2023-01-31 MED ORDER — CARVEDILOL 6.25 MG PO TABS
6.2500 mg | ORAL_TABLET | Freq: Two times a day (BID) | ORAL | Status: DC
  Administered 2023-01-31: 6.25 mg via ORAL
  Filled 2023-01-31: qty 1

## 2023-01-31 MED ORDER — CARVEDILOL 6.25 MG PO TABS
6.2500 mg | ORAL_TABLET | Freq: Two times a day (BID) | ORAL | 0 refills | Status: AC
  Filled 2023-01-31: qty 60, 30d supply, fill #0

## 2023-01-31 NOTE — TOC Transition Note (Signed)
Transition of Care [facility]) - CM/SW Discharge Note   Patient Details  Name: [name] MRN: [id number] Date of Birth: [birth year]  Transition of Care [facility]) CM/SW Contact:  Mearl Latin, LCSW Phone Number: 01/31/2023, 11:15 AM   Clinical Narrative:    Patient provided with taxi voucher to [address] as she does not have a way home. [employer] placed on AVS for follow up.          Patient Goals and CMS Choice      Discharge Placement                         Discharge Plan and Services Additional resources added to the After Visit Summary for                                       Social Determinants of Health (SDOH) Interventions SDOH Screenings   Food Insecurity: Patient Declined (01/29/2023)  Housing: High Risk (01/29/2023)  Transportation Needs: Patient Declined (01/29/2023)  Utilities: Patient Declined (01/29/2023)  Financial Resource Strain: Not on File (06/15/2021)   Received from [place], [place]  Physical Activity: Not on File (06/15/2021)   Received from [place], [place]  Social Connections: Not on File (11/10/2022)   Received from [facility]  Stress: Not on File (06/15/2021)   Received from [place], [place]  Tobacco Use: High Risk (01/29/2023)     Readmission Risk Interventions     No data to display
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
## 2023-01-31 NOTE — Discharge Instructions (Addendum)
Do not drive, operate heavy machinery, perform activities at heights, swimming or participation in water activities or provide baby sitting services until you have seen by Primary MD or a Neurologist and advised to do so again.  Follow with Primary MD Cline Crock, NP in 7 days   Get CBC, CMP, Magnesium -  checked next visit with your primary MD   Activity: As tolerated with Full fall precautions use walker/cane & assistance as needed  Disposition Home    Diet: Heart Healthy   Special Instructions: If you have smoked or chewed Tobacco  in the last 2 yrs please stop smoking, stop any regular Alcohol  and or any Recreational drug use.  On your next visit with your primary care physician please Get Medicines reviewed and adjusted.  Please request your Prim.MD to go over all Hospital Tests and Procedure/Radiological results at the follow up, please get all Hospital records sent to your Prim MD by signing hospital release before you go home.  If you experience worsening of your admission symptoms, develop shortness of breath, life threatening emergency, suicidal or homicidal thoughts you must seek medical attention immediately by calling 911 or calling your MD immediately  if symptoms less severe.  You Must read complete instructions/literature along with all the possible adverse reactions/side effects for all the Medicines you take and that have been prescribed to you. Take any new Medicines after you have completely understood and accpet all the possible adverse reactions/side effects.   Do not drive when taking Pain medications.  Do not take more than prescribed Pain, Sleep and Anxiety Medications   ______________________________________________________________________________________________________ [facility]:  The [facility]) can help you navigate the resources below. If you would like to speak with someone about  assistance with any of the items listed below, contact the [facility] at [phone] or visit our location at [address].  Victim Advocacy Safety Planning Crisis Shelter Assistance Trauma Counseling Child Advocacy Elder Abuse Assistance Sexual Assault Victims Assistance Domestic Violence Protection Order Application (50-B Restraining Order) Warrants  If the officer investigating your case advised you to seek a warrant, you may do so by appearing before a [employer] located at [address]. The phone number is [phone]. The office is open 24 hours a day.   Victim Information And Assistance  The [facility] is located at [address]. The [facility] Attorney's office is located on the 4th floor of the [place] and is open from 8 am to 5 pm, Mondays through Fridays. Contact number is [phone].  Additionally , the following agencies may be of assistance to you:  [facility] Justice Center:  [phone] Victim/Witness Assistance Program:  (805) 376-3520  Crime Victims' Compensation Commission:  785-382-9110 Department of Social Services Information and Referral Program:  301-879-1749 24 Hour Crisis Line:  (772)156-9197 Crime Stoppers:  854-484-8680 Child Protective Services (CPS):  [phone] After hours:  [phone]

## 2023-05-04 ENCOUNTER — Emergency Department (HOSPITAL_COMMUNITY)
Admission: EM | Admit: 2023-05-04 | Discharge: 2023-05-05 | Disposition: A | Discharge status: Home / Self Care | Payer: MEDICAID | Attending: Emergency Medicine | Admitting: Emergency Medicine

## 2023-05-04 DIAGNOSIS — F419 Anxiety disorder, unspecified: Secondary | ICD-10-CM | POA: Insufficient documentation

## 2023-05-04 DIAGNOSIS — F39 Unspecified mood [affective] disorder: Secondary | ICD-10-CM | POA: Diagnosis not present

## 2023-05-04 DIAGNOSIS — F29 Unspecified psychosis not due to a substance or known physiological condition: Secondary | ICD-10-CM | POA: Insufficient documentation

## 2023-05-04 DIAGNOSIS — Z91040 Latex allergy status: Secondary | ICD-10-CM | POA: Diagnosis not present

## 2023-05-04 DIAGNOSIS — Z8659 Personal history of other mental and behavioral disorders: Secondary | ICD-10-CM | POA: Insufficient documentation

## 2023-05-04 DIAGNOSIS — F1590 Other stimulant use, unspecified, uncomplicated: Secondary | ICD-10-CM | POA: Diagnosis not present

## 2023-05-04 LAB — COMPREHENSIVE METABOLIC PANEL
ALT: 16 U/L (ref 0–44)
AST: 17 U/L (ref 15–41)
Albumin: 4 g/dL (ref 3.5–5.0)
Alkaline Phosphatase: 84 U/L (ref 38–126)
Anion gap: 11 (ref 5–15)
BUN: 14 mg/dL (ref 6–20)
CO2: 24 mmol/L (ref 22–32)
Calcium: 9.1 mg/dL (ref 8.9–10.3)
Chloride: 102 mmol/L (ref 98–111)
Creatinine, Ser: 0.87 mg/dL (ref 0.44–1.00)
GFR, Estimated: >60 mL/min (ref >60)
Glucose, Bld: 113 mg/dL — ABNORMAL HIGH (ref 70–99)
Potassium: 3.7 mmol/L (ref 3.5–5.1)
Sodium: 137 mmol/L (ref 135–145)
Total Bilirubin: 0.5 mg/dL (ref 0.0–1.2)
Total Protein: 8.2 g/dL — ABNORMAL HIGH (ref 6.5–8.1)

## 2023-05-04 LAB — CBC WITH DIFFERENTIAL/PLATELET
Abs Immature Granulocytes: 0.03 10*3/uL (ref 0.00–0.07)
Basophils Absolute: 0 10*3/uL (ref 0.0–0.1)
Basophils Relative: 0 %
Eosinophils Absolute: 0.1 10*3/uL (ref 0.0–0.5)
Eosinophils Relative: 1 %
HCT: 40.7 % (ref 36.0–46.0)
Hemoglobin: 12.6 g/dL (ref 12.0–15.0)
Immature Granulocytes: 0 %
Lymphocytes Relative: 32 %
Lymphs Abs: 3 10*3/uL (ref 0.7–4.0)
MCH: 29.6 pg (ref 26.0–34.0)
MCHC: 31 g/dL (ref 30.0–36.0)
MCV: 95.8 fL (ref 80.0–100.0)
Monocytes Absolute: 0.6 10*3/uL (ref 0.1–1.0)
Monocytes Relative: 6 %
Neutro Abs: 5.6 10*3/uL (ref 1.7–7.7)
Neutrophils Relative %: 61 %
Platelets: 462 10*3/uL — ABNORMAL HIGH (ref 150–400)
RBC: 4.25 MIL/uL (ref 3.87–5.11)
RDW: 13.3 % (ref 11.5–15.5)
WBC: 9.3 10*3/uL (ref 4.0–10.5)
nRBC: 0 % (ref 0.0–0.2)

## 2023-05-04 LAB — ACETAMINOPHEN LEVEL: Acetaminophen (Tylenol), Serum: <10 ug/mL — ABNORMAL LOW (ref 10–30)

## 2023-05-04 LAB — ETHANOL: Alcohol, Ethyl (B): <10 mg/dL (ref <10)

## 2023-05-04 LAB — SALICYLATE LEVEL: Salicylate Lvl: <7 mg/dL — ABNORMAL LOW (ref 7.0–30.0)

## 2023-05-04 MED ORDER — DIAZEPAM 5 MG PO TABS
5.0000 mg | ORAL_TABLET | Freq: Once | ORAL | Status: AC
  Administered 2023-05-04: 5 mg via ORAL
  Filled 2023-05-04: qty 1

## 2023-05-04 NOTE — ED Provider Notes (Signed)
 [place] [facility] DE[facility]MENT AT [facility] Provider Note   CSN: [id number] Arrival date & time: 05/04/23  1805     History  No chief complaint on file.   [name] is a 38 y.o. female.  38 yo F with a cc of mental health breakdown.  Going on for the past day.  Patient just seen by her mental health team and was [facility].  Just got out today.  Patient feels overwhelmed and doesn't think she can cope.  Doesn't have her medicines.  Doesn't endorse HI, SI.          Home Medications Prior to Admission medications   Medication Sig Start Date End Date Taking? Authorizing Provider  acetaminophen (TYLENOL) 325 MG tablet Take 650 mg by mouth every 6 (six) hours as needed for mild pain or headache.    [provider]  albuterol (PROVENTIL) (2.5 MG/3ML) 0.083% nebulizer solution Take 3 mLs (2.5 mg total) by nebulization every 6 (six) hours as needed for wheezing or shortness of breath. 12/07/22   Wynonia Lawman A, NP  amLODipine (NORVASC) 10 MG tablet Take 1 tablet (10 mg total) by mouth daily. 02/01/23   Leroy Sea, MD  carvedilol (COREG) 6.25 MG tablet Take 1 tablet (6.25 mg total) by mouth 2 (two) times daily with a meal. 01/31/23   Leroy Sea, MD  oxyCODONE-acetaminophen (PERCOCET) 10-325 MG tablet SMARTSIG:1 Tablet(s) By Mouth Every 12 Hours 12/20/22   [provider]      Allergies    Ketorolac tromethamine, Latex, Lortab [hydrocodone-acetaminophen], and Tramadol    Review of Systems   Review of Systems  Physical Exam Updated Vital Signs BP (!) 151/99   Pulse 89   Temp 98.4 F (36.9 C) (Oral)   Resp 20   SpO2 100%  Physical Exam Vitals and nursing note reviewed.  Constitutional:      General: She is not in acute distress.    Appearance: She is well-developed. She is not diaphoretic.  HENT:     Head: Normocephalic and atraumatic.  Eyes:     Pupils: Pupils are equal, round, and reactive to light.   Cardiovascular:     Rate and Rhythm: Normal rate and regular rhythm.     Heart sounds: No murmur heard.    No friction rub. No gallop.  Pulmonary:     Effort: Pulmonary effort is normal.     Breath sounds: No wheezing or rales.  Abdominal:     General: There is no distension.     Palpations: Abdomen is soft.     Tenderness: There is no abdominal tenderness.  Musculoskeletal:        General: No tenderness.     Cervical back: Normal range of motion and neck supple.  Skin:    General: Skin is warm and dry.  Neurological:     Mental Status: She is alert and oriented to person, place, and time.  Psychiatric:        Behavior: Behavior normal.     ED Results / Procedures / Treatments   Labs (all labs ordered are listed, but only abnormal results are displayed) Labs Reviewed  CBC WITH DIFFERENTIAL/PLATELET - Abnormal; Notable for the following components:      Result Value   Platelets 462 (*)    All other components within normal limits  COMPREHENSIVE METABOLIC PANEL - Abnormal; Notable for the following components:   Glucose, Bld 113 (*)    Total Protein 8.2 (*)  All other components within normal limits  ACETAMINOPHEN LEVEL - Abnormal; Notable for the following components:   Acetaminophen (Tylenol), Serum <10 (*)    All other components within normal limits  SALICYLATE LEVEL - Abnormal; Notable for the following components:   Salicylate Lvl <7.0 (*)    All other components within normal limits  ETHANOL  RAPID URINE DRUG SCREEN, HOSP PERFORMED  HCG, SERUM, QUALITATIVE    EKG None  Radiology No results found.  Procedures Procedures    Medications Ordered in ED Medications  diazepam (VALIUM) tablet 5 mg (5 mg Oral Given 05/04/23 1924)    ED Course/ Medical Decision Making/ A&P                                 Medical Decision Making Amount and/or Complexity of Data Reviewed Labs: ordered.  Risk Prescription drug management.   38 yo F with a cc of  mental breakdown.  Patient feels like she cannot cope with life.  She said nothing seems to be going right in her life.  She was just at [facility] and was discharged without any of her medications.  She is likely having a constant panic attack.  She denies any medical complaints.  I feel she is medically clear.  TTS evaluation.  No significant electrolyte abnormalities.  No anemia.  The patients results and plan were reviewed and discussed.   Any x-rays performed were independently reviewed by myself.   Differential diagnosis were considered with the presenting HPI.  Medications  diazepam (VALIUM) tablet 5 mg (5 mg Oral Given 05/04/23 1924)    Vitals:   05/04/23 1821  BP: (!) 151/99  Pulse: 89  Resp: 20  Temp: 98.4 F (36.9 C)  TempSrc: Oral  SpO2: 100%    Final diagnoses:  Anxiety            Final Clinical Impression(s) / ED Diagnoses Final diagnoses:  Anxiety    Rx / DC Orders ED Discharge Orders     None         Melene Plan, DO 05/04/23 2017

## 2023-05-04 NOTE — BH Assessment (Signed)
 [facility] contacted @1940 . Eudelia Bunch w/[facility] placed the patient on the schedule to be seen.

## 2023-05-04 NOTE — ED Triage Notes (Addendum)
 Patient says [facility], but was discharged from there today because she didn't have meds, says she was outside for 3 hours and got an uber here and is having mental health crisis, break down Says her mind is everywhere Doesn't have her medicine Patient tearful in triage  Denies pain  Patient states  Patient says she is not having SI or HI  But chronic cocaine use and doesn't want to use any longer Last use 1 week ago  Denies alcohol usage Denies AVH Says she is depressed , anxious  homeless

## 2023-05-05 DIAGNOSIS — F1590 Other stimulant use, unspecified, uncomplicated: Secondary | ICD-10-CM

## 2023-05-05 DIAGNOSIS — F39 Unspecified mood [affective] disorder: Secondary | ICD-10-CM

## 2023-05-05 LAB — HCG, SERUM, QUALITATIVE: Preg, Serum: NEGATIVE

## 2023-05-05 MED ORDER — TRAZODONE HCL 50 MG PO TABS: 50.0000 mg | ORAL_TABLET | Freq: Every evening | ORAL | 0 refills | Status: AC | PRN

## 2023-05-05 MED ORDER — TRAZODONE HCL 100 MG PO TABS: 50.0000 mg | ORAL_TABLET | Freq: Every day | ORAL | Status: DC

## 2023-05-05 MED ORDER — HYDROXYZINE HCL 25 MG PO TABS
25.0000 mg | ORAL_TABLET | Freq: Once | ORAL | Status: AC
Start: 2023-05-05 — End: 2023-05-05
  Administered 2023-05-05: 25 mg via ORAL
  Filled 2023-05-05: qty 1

## 2023-05-05 MED ORDER — ARIPIPRAZOLE 5 MG PO TABS
10.0000 mg | ORAL_TABLET | Freq: Every day | ORAL | Status: DC
  Administered 2023-05-05: 10 mg via ORAL
  Filled 2023-05-05: qty 2

## 2023-05-05 MED ORDER — ARIPIPRAZOLE 10 MG PO TABS: 10.0000 mg | ORAL_TABLET | Freq: Every day | ORAL | 0 refills | Status: AC

## 2023-05-05 NOTE — Progress Notes (Signed)
 Per chart review, pt was assessed by [facility] provider and did not meet criteria for inpatient psych. TOC consulted for shelter and substance abuse resources. CSW attached all resources including DSS resources. No additional TOC needs at this time.

## 2023-05-05 NOTE — ED Provider Notes (Signed)
 Emergency Medicine Observation Re-evaluation Note  [name] is a 38 y.o. female, seen on rounds today.  Pt initially presented to the ED for complaints of No chief complaint on file. Currently, the patient is resting.  Physical Exam  BP (!) 151/99   Pulse 89   Temp 98.4 F (36.9 C) (Oral)   Resp 20   SpO2 100%  Physical Exam General: NAD   ED Course / MDM  EKG:   I have reviewed the labs performed to date as well as medications administered while in observation.  Recent changes in the last 24 hours include no acute events reported.  Plan  Current plan is for SW / TOC.    Wynetta Fines, MD 05/05/23 [phone]

## 2023-05-05 NOTE — Consult Note (Signed)
 [facility] Consult Note  Patient Name: [name] MRN: [id number] DOB: 1985-09-10 DATE OF Consult: 05/05/2023  PRIMARY PSYCHIATRIC DIAGNOSES  1.  Stimulant use disorder 2.  Unspecified mood disorder 3.  Schizoaffective disorder per history  RECOMMENDATIONS  Recommendations: Medication recommendations: Recommend Abilify 10mg  po daily for mood; Recommend trazodone 50mg  po nightly for sleep Non-Medication/therapeutic recommendations: Recommend social work consult and referral to or resources for inpatient substance use treatment or dual diagnosis program; resources for shelter; follow-up with outpatient case manager; crisis line information; ED return precautions Follow-Up Telepsychiatry C/L services: We will sign off for now. Please re-consult our service if needed for any concerning changes in the patient's condition, discharge planning, or questions. Communication: Treatment team members (and family members if applicable) who were involved in treatment/care discussions and planning, and with whom we spoke or engaged with via secure text/chat, include the following: [name], [name]  [name] is a 38 year old female, experiencing homelessness, with a history of stimulant and cannabis use disorders, schizoaffective disorder, depressive type, substance induced mood disorder, seizure disorder who presents to the ED after being discharged from dual diagnosis program reporting feeling depressed and anxious. Chart reviewed. No UDS resulted at time of evaluation. Given diazepam 5mg  and hydroxyzine 25mg  in the ED. Psychiatry consulted for diagnostic clarification and disposition recommendations. On evaluation, patient noted to be cooperative, child-like, dysthymic, linear, not appearing internally preoccupied, not responding to internal stimuli, alert and oriented x 4. Patient reports she was in rehab for 3 days and was discharged because she ran out of her anti-hypertensive. Reports she  wants to return to rehab to get treatment so she does not use cocaine. Endorses low mood due to feeling like nothing works out for her. Denies other depressive symptoms. Denies symptoms consistent with mania/hypomania, paranoia, auditory and visual hallucinations, homicidal ideation. Patient denies suicidal ideation and homicidal ideation, intent, and plan. Patient reports she is interested in inpatient substance use treatment. Patient's presentation is consistent with stimulant use disorder and unspecified mood disorder, rule out schizoaffective disorder per history. Patient denies suicidal ideation, intent, plan. Patient is currently a low risk for suicide due to identifies reasons to live, future oriented, help seeking, social support. Therefore, inpatient psychiatric hospitalization is not recommended. Patient would benefit from social work consult and referral to or resources for inpatient rehab or dual diagnosis.   Thank you for involving [place] in the care of this patient. If you have any additional questions or concerns, please call [phone] and ask for me or the provider on-call.  TELEPSYCHIATRY ATTESTATION & CONSENT  As the provider for this telehealth consult, I attest that I verified the patient's identity using two separate identifiers, introduced myself to the patient, provided my credentials, disclosed my location, and performed this encounter via a HIPAA-compliant, real-time, face-to-face, two-way, interactive audio and video platform and with the full consent and agreement of the patient (or guardian as applicable.)  Patient physical location: ED in [facility]  Telehealth provider physical location: home office in state of [place]   Video start time: 0619 AM EST Video end time: 0632 AM EST   IDENTIFYING DATA  [name] is a 38 y.o. year-old female for whom a psychiatric consultation has been ordered by the primary provider. The patient was identified using two  separate identifiers.  CHIEF COMPLAINT/REASON FOR CONSULT  "Mental breakdown"   HISTORY OF PRESENT ILLNESS (HPI)  [name] is a 38 year old female, experiencing homelessness, with a history of  stimulant and cannabis use disorders, schizoaffective disorder, depressive type, substance induced mood disorder, seizure disorder who presents to the ED after being discharged from dual diagnosis program reporting feeling depressed and anxious. Chart reviewed. No UDS resulted at time of evaluation. Given diazepam 5mg  and hydroxyzine 25mg  in the ED. Psychiatry consulted for diagnostic clarification and disposition recommendations.  On evaluation, patient noted to be cooperative, child-like, dysthymic, linear, not appearing internally preoccupied, not responding to internal stimuli, alert and oriented x 4. Patient reports she was at [facility] for substance use treatment and she was released because she did not have her medication and "it triggered me." She states she did not have enough of her blood pressure medication so they kicked her out. Reports she was there since Thursday and then "I got stranded for 3 hours outside." Patient states she came to the ED "because I didn't know what to do. I put myself in treatment to get help and I can't get help." Patient reports she last used cocaine 4 days ago. Patient reports she had never been in rehab before her 3 day stay at [facility]. Patient reports she feels down due to not being able to "better myself the way I want to." Denies other depressive symptoms. Denies symptoms consistent with mania/hypomania, paranoia, auditory and visual hallucinations, homicidal ideation. Patient denies suicidal ideation and homicidal ideation, intent, and plan. Patient reports her psychiatrist's wife got sick so she has been out of her psychiatric medication: Abilify and trazodone for 2 months. Patient reports her mom was killed when she was young. States she grew up in foster care.  Reports she has mental health case manager who is supportive. 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 Patient reports she is interested in inpatient substance use treatment.     PAST PSYCHIATRIC HISTORY  Current psych meds: Denies current  Prior psych meds: Abilify, Seroquel, Haldol, Effexor, Zoloft, Prazosin, trazodone, Klonopin, Xanax Outpatient: Mental health case manager  Inpatient: Multiple Non-suicidal self injury: History of cutting, last did > 2 years ago   Suicide attempts: Denies  Violence: Denies  Drugs/alcohol: Cocaine, last use 1 week ago  Trauma/neglect/abuse: Yes Otherwise as per HPI above.  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   albuterol (PROVENTIL) (2.5 MG/3ML) 0.083% nebulizer solution Take 3 mLs (2.5 mg total) by nebulization every 6 (six) hours as needed for wheezing or shortness of breath.   oxyCODONE-acetaminophen (PERCOCET) 10-325 MG tablet SMARTSIG:1 Tablet(s) By Mouth Every 12 Hours   carvedilol (COREG) 6.25 MG tablet Take 1 tablet (6.25 mg total) by mouth 2 (two) times daily with a meal.   amLODipine (NORVASC) 10 MG tablet Take 1 tablet (10 mg total) by mouth daily.     ALLERGIES  Allergies  Allergen Reactions   Ketorolac Tromethamine Hives   Latex Hives   Lortab [Hydrocodone-Acetaminophen] Hives and Other (See Comments)     Tolerates acetaminophen   Tramadol Hives    SOCIAL & SUBSTANCE USE HISTORY  Social History   Socioeconomic History   Marital status: Single    Spouse name: Not on file   Number of children: Not on file   Years of education: Not on file   Highest education level: Not on file  Occupational History   Occupation: Student   Tobacco Use   Smoking status: Light Smoker    Current packs/day: 0.00    Average packs/day: 0.2 packs/day for 2.0 years (0.3 ttl pk-yrs)    Types: Cigarettes    Start date: 07/28/2010    Last attempt to quit: 07/27/2012    Years since quitting: 10.7   Smokeless tobacco: Never  Vaping Use   Vaping status: Never Used  Substance and Sexual Activity   Alcohol use: No   Drug use: Yes    Frequency: 7.0 times per week    Types: Marijuana   Sexual activity: Yes    Birth control/protection: Other-see comments    Comment: pt states "i can't have kids"  Other Topics Concern   Not on file  Social History Narrative   Not on file   Social Drivers of Health   Financial Resource Strain: Not on File (06/15/2021)   Received from [employer] [facility] [employer]    Financial Resource Strain: 0  Food Insecurity: Patient Declined (01/29/2023)   Hunger Vital Sign    Worried About Running Out of Food in the Last Year: Patient declined    Ran Out of Food in the Last Year: Patient declined  Transportation Needs: Patient Declined (01/29/2023)   PRAPARE - Administrator, Civil Service (Medical): Patient declined    Lack of Transportation (Non-Medical): Patient declined  Physical Activity: Not on File (06/15/2021)   Received from [place], [place]   Physical Activity    Physical Activity: 0  Stress: Not on File (06/15/2021)   Received from [facility], [place]   Stress    Stress: 0  Social Connections: Not on File (11/10/2022)   Received from [employer]   Social Connections    Connectedness: 0   Social History   Tobacco Use  Smoking Status Light Smoker   Current  packs/day: 0.00   Average packs/day: 0.2 packs/day for 2.0 years (0.3 ttl pk-yrs)   Types: Cigarettes   Start date: 07/28/2010   Last attempt to quit: 07/27/2012   Years since quitting: 10.7  Smokeless Tobacco Never   Social History   Substance and Sexual Activity  Alcohol Use No   Social History   Substance and Sexual Activity  Drug Use Yes   Frequency: 7.0 times per week   Types: Marijuana    Additional pertinent information Cocaine, last use 1 week ago.  FAMILY HISTORY  Family History  Problem Relation Age of Onset   Diabetes type II Other    Asthma Mother    Family Psychiatric History (if known):  Denies    MENTAL STATUS EXAM (MSE)  Mental Status Exam: General Appearance: Neat  Orientation:  Full (Time, Place, and Person)  Memory:  Immediate;   Good Recent;   Fair Remote;   Fair  Concentration:  Concentration: Good  Recall:  Good  Attention  Good  Eye Contact:  Good  Speech:  Normal Rate  Language:  Good  Volume:  Normal  Mood: "Fine"  Affect:  Appropriate  Thought Process:  Coherent  Thought Content:  Abstract Reasoning  Suicidal Thoughts:  No  Homicidal Thoughts:  No  Judgement:  Good  Insight:  Good  Psychomotor Activity:  Negative  Akathisia:  NA  Fund of Knowledge:  Fair    Assets:  Desire for Improvement Social Support  Cognition:  WNL  ADL's:  Intact  AIMS (if indicated):       VITALS  Blood pressure (!) 151/99, pulse 89, temperature 98.4 F (36.9 C), temperature source Oral, resp. rate 20, SpO2 100%.  LABS  Admission on 05/04/2023  Component Date Value Ref Range Status   WBC 05/04/2023 9.3  4.0 - 10.5 K/uL Final   RBC 05/04/2023 4.25  3.87 - 5.11 MIL/uL Final   Hemoglobin 05/04/2023 12.6  12.0 - 15.0 g/dL Final   HCT 16/11/9602 40.7  36.0 - 46.0 % Final   MCV 05/04/2023 95.8  80.0 - 100.0 fL Final   MCH 05/04/2023 29.6  26.0 - 34.0 pg Final   MCHC 05/04/2023 31.0  30.0 - 36.0 g/dL Final   RDW 54/10/8117 13.3  11.5 - 15.5 % Final    Platelets 05/04/2023 462 (H)  150 - 400 K/uL Final   nRBC 05/04/2023 0.0  0.0 - 0.2 % Final   Neutrophils Relative % 05/04/2023 61  % Final   Neutro Abs 05/04/2023 5.6  1.7 - 7.7 K/uL Final   Lymphocytes Relative 05/04/2023 32  % Final   Lymphs Abs 05/04/2023 3.0  0.7 - 4.0 K/uL Final   Monocytes Relative 05/04/2023 6  % Final   Monocytes Absolute 05/04/2023 0.6  0.1 - 1.0 K/uL Final   Eosinophils Relative 05/04/2023 1  % Final   Eosinophils Absolute 05/04/2023 0.1  0.0 - 0.5 K/uL Final   Basophils Relative 05/04/2023 0  % Final   Basophils Absolute 05/04/2023 0.0  0.0 - 0.1 K/uL Final   Immature Granulocytes 05/04/2023 0  % Final   Abs Immature Granulocytes 05/04/2023 0.03  0.00 - 0.07 K/uL Final   Performed at [facility], [address], [place], [place] [address]   Sodium 05/04/2023 137  135 - 145 mmol/L Final   Potassium 05/04/2023 3.7  3.5 - 5.1 mmol/L Final   Chloride 05/04/2023 102  98 - 111 mmol/L Final   CO2 05/04/2023 24  22 - 32 mmol/L Final   Glucose, Bld 05/04/2023 113 (H)  70 - 99 mg/dL Final   Glucose reference range applies only to samples taken after fasting for at least 8 hours.   BUN 05/04/2023 14  6 - 20 mg/dL Final   Creatinine, Ser 05/04/2023 0.87  0.44 - 1.00 mg/dL Final   Calcium 95/62/1308 9.1  8.9 - 10.3 mg/dL Final   Total Protein 65/78/4696 8.2 (H)  6.5 - 8.1 g/dL Final   Albumin 29/52/8413 4.0  3.5 - 5.0 g/dL Final   AST 24/40/1027 17  15 - 41 U/L Final   ALT 05/04/2023 16  0 - 44 U/L Final   Alkaline Phosphatase 05/04/2023 84  38 - 126 U/L Final   Total Bilirubin 05/04/2023 0.5  0.0 - 1.2 mg/dL Final   GFR, Estimated 05/04/2023 >60  >60 mL/min Final   Comment: (NOTE) Calculated using the CKD-EPI Creatinine Equation (2021)    Anion gap 05/04/2023 11  5 - 15 Final   Performed at [facility], [address], [place], [place] [address]   Acetaminophen (Tylenol), Serum  05/04/2023 <10 (L)  10 - 30 ug/mL Final    Comment: (NOTE) Therapeutic concentrations vary significantly. A range of 10-30 ug/mL  may be an effective concentration for many patients. However, some  are best treated at concentrations outside of this range. Acetaminophen concentrations >150 ug/mL at 4 hours after ingestion  and >50 ug/mL at 12 hours after ingestion are often associated with  toxic reactions.  Performed at [facility], [address], [place], [place] [address]    Salicylate Lvl 05/04/2023 <7.0 (L)  7.0 - 30.0 mg/dL Final   Performed at [facility], [address], [place], [place] [address]   Alcohol, Ethyl (B) 05/04/2023 <10  <10 mg/dL Final   Comment: (NOTE) Lowest detectable limit for serum alcohol is 10 mg/dL.  For medical purposes only. Performed at [facility], [address], [place], [place] [address]    Preg, Serum 05/05/2023 NEGATIVE  NEGATIVE Final   Comment:        THE SENSITIVITY OF THIS METHODOLOGY IS >10 mIU/mL. Performed at [facility], [address] [place], [place] [address]     PSYCHIATRIC REVIEW OF SYSTEMS (ROS)  ROS: Notable for the following relevant positive findings: Review of Systems  Psychiatric/Behavioral:  Positive for depression.     Additional findings:      Musculoskeletal: No abnormal movements observed      Gait & Station: Laying/Sitting      Pain Screening: Denies      Nutrition & Dental Concerns: n/a   RISK FORMULATION/ASSESSMENT  Is the patient experiencing any suicidal or homicidal ideations: No      Protective factors considered for safety management: identifies reasons to live, future oriented, help seeking, social support.  Risk factors/concerns considered for safety management:  Depression Substance abuse/dependence Unmarried  Is there a safety management plan with the patient and treatment team to minimize risk factors and promote protective factors: Yes            Explain: Social work consult and referral to or resources for inpatient substance use treatment or dual diagnosis; follow-up with outpatient case manager; shelter resources; crisis line information; ED return precautions  Is crisis care placement or psychiatric hospitalization recommended: No     Based on my current evaluation and risk assessment, patient is determined at this time to be at:  Low risk  *RISK ASSESSMENT Risk assessment is a dynamic process; it is possible that this patient's condition, and risk level, may change. This should be re-evaluated and managed over time as appropriate. Please re-consult psychiatric consult services if additional assistance is needed in terms of risk assessment and management. If your team decides to discharge this patient, please advise the patient how to best access emergency psychiatric services, or to call 911, if their condition worsens or they feel unsafe in any way.   Donna Dill, MD Telepsychiatry Consult Services

## 2023-05-05 NOTE — Discharge Instructions (Addendum)
 Return for any problem.   As discussed with psychiatry, please start taking Abilify 10 mg daily.  This will help improve your mood.  If required, you may start using trazodone at night to help with your sleep.  The dose is 50 mg taken in the evening as needed for insomnia.

## 2023-05-05 NOTE — ED Notes (Signed)
TTS consult at this time. 

## 2023-08-12 ENCOUNTER — Encounter (HOSPITAL_COMMUNITY): Payer: Self-pay | Admitting: Emergency Medicine

## 2023-08-12 ENCOUNTER — Ambulatory Visit (HOSPITAL_COMMUNITY)
Admission: EM | Admit: 2023-08-12 | Discharge: 2023-08-12 | Disposition: A | Discharge status: Home / Self Care | Payer: MEDICAID

## 2023-08-12 DIAGNOSIS — R051 Acute cough: Secondary | ICD-10-CM | POA: Diagnosis not present

## 2023-08-12 DIAGNOSIS — J0110 Acute frontal sinusitis, unspecified: Secondary | ICD-10-CM | POA: Diagnosis not present

## 2023-08-12 MED ORDER — AZITHROMYCIN 250 MG PO TABS
250.0000 mg | ORAL_TABLET | Freq: Every day | ORAL | 0 refills | Status: AC
Start: 2023-08-12 — End: ?

## 2023-08-12 MED ORDER — PROMETHAZINE-DM 6.25-15 MG/5ML PO SYRP: 5.0000 mL | ORAL_SOLUTION | Freq: Four times a day (QID) | ORAL | 0 refills | Status: AC | PRN

## 2023-08-12 MED ORDER — BENZONATATE 100 MG PO CAPS
100.0000 mg | ORAL_CAPSULE | Freq: Three times a day (TID) | ORAL | 0 refills | Status: AC
Start: 2023-08-12 — End: ?

## 2023-08-12 MED ORDER — AMOXICILLIN-POT CLAVULANATE 875-125 MG PO TABS: 1.0000 | ORAL_TABLET | Freq: Two times a day (BID) | ORAL | 0 refills | Status: AC

## 2023-08-12 MED ORDER — FLUCONAZOLE 150 MG PO TABS
150.0000 mg | ORAL_TABLET | Freq: Every day | ORAL | 0 refills | Status: AC
Start: 2023-08-12 — End: ?

## 2023-08-12 NOTE — Discharge Instructions (Signed)
 We have sent in multiple medications to help with your symptoms.  We have also sent in a medication in case you develop a yeast infection.  We recommend following up with your primary care provider if symptoms are not improving.  Please return or go to the emergency department if you have any chest pain, shortness of breath, worsening of symptoms, or if you have any other concerns.

## 2023-08-12 NOTE — ED Triage Notes (Signed)
 Pt c/o productive cough (yellow) x's 3-4 weeks.   St's has been using her neb tx at home and her inhaler without relief.  Pt st's she has pain in her rib cage from coughing

## 2023-08-12 NOTE — ED Provider Notes (Signed)
 [facility]    CSN: [id number] Arrival date & time: 08/12/23  1423      History   Chief Complaint Chief Complaint  Patient presents with   Cough    HPI [name] is a 38 y.o. female.   Patient is a 38 year old female who presents to the urgent care today with concerns of cough, sinus pressure, sinus pain, and nasal and chest congestion.  She reports that her symptoms began about a month ago.  She has history of asthma and has been using her breathing treatments which has helped with the wheezing.  She reports that she is now having pain in the ribs after coughing.  She reports getting a cough medicine last time which seemed to help with her symptoms.  She denies any fever, vomiting, hemoptysis, chest pain, shortness of breath, or other concerns at this time.  She reports quitting smoking about 6 months ago.    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)    Hypertension    Kidney stone    Kidney stones    Ovarian cancer (HCC)    Schizoaffective disorder    Seizures (HCC)     Patient Active Problem List   Diagnosis Date Noted   Seizure (HCC) 01/31/2023   Seizure-like activity (HCC) 01/29/2023   Polysubstance abuse (HCC) 01/10/2022   Substance induced mood disorder (HCC) 01/10/2022   Biliary colic 07/09/2017   Tobacco use disorder 09/01/2015   Cannabis use disorder, moderate, dependence (HCC) 09/01/2015   OSA (obstructive sleep apnea) 10/07/2013   Upper airway cough syndrome 09/03/2013   Essential hypertension 09/03/2013   Schizoaffective disorder, depressive type (HCC) 02/22/2011    Past Surgical History:  Procedure Laterality Date   ABDOMINAL HYSTERECTOMY     partial; took fallopian tubes and ovaries   APPENDECTOMY     CHOLECYSTECTOMY N/A 07/09/2017   Procedure: LAPAROSCOPIC CHOLECYSTECTOMY;  Surgeon:  Adalberto Acton, MD;  Location: [facility];  Service: General;  Laterality: N/A;   kidney stone removal      OB History     Gravida  0   Para  0   Term  0   Preterm  0   AB  0   Living  0      SAB  0   IAB  0   Ectopic  0   Multiple  0   Live Births               Home Medications    Prior to Admission medications   Medication Sig Start Date End Date Taking? Authorizing Provider  amoxicillin -clavulanate (AUGMENTIN ) 875-125 MG tablet Take 1 tablet by mouth every 12 (twelve) hours. 08/12/23  Yes Edna Gouty, PA-C  azithromycin  (ZITHROMAX ) 250 MG tablet Take 1 tablet (250 mg total) by mouth daily. Take first 2 tablets together, then 1 every day until finished. 08/12/23  Yes Edna Gouty, PA-C  benzonatate  (TESSALON ) 100 MG capsule Take 1 capsule (100 mg total) by mouth every 8 (eight) hours. 08/12/23  Yes Edna Gouty, PA-C  fluconazole  (DIFLUCAN ) 150 MG tablet Take 1 tablet (150 mg total) by mouth daily. Take at the first sign of yeast infection, if still experiencing symptoms 72 hours later, you can take the second pill 08/12/23  Yes Edna Gouty, PA-C  promethazine -dextromethorphan (PROMETHAZINE -DM) 6.25-15 MG/5ML syrup Take 5 mLs by mouth 4 (four) times daily as needed for cough. 08/12/23  Yes Edna Gouty, PA-C  acetaminophen  (TYLENOL ) 325 MG tablet Take 650 mg by mouth every 6 (six) hours as needed for mild pain or headache.    [provider]  albuterol  (PROVENTIL ) (2.5 MG/3ML) 0.083% nebulizer solution Take 3 mLs (2.5 mg total) by nebulization every 6 (six) hours as needed for wheezing or shortness of breath. 12/07/22   Levora Reas A, NP  amLODipine  (NORVASC ) 10 MG tablet Take 1 tablet (10 mg total) by mouth daily. 02/01/23   Cala Castleman, MD  ARIPiprazole  (ABILIFY ) 10 MG tablet Take 1 tablet (10 mg total) by mouth daily. 05/05/23   Burnette Carte, MD  carvedilol  (COREG ) 6.25 MG tablet Take 1 tablet (6.25 mg total) by mouth 2 (two) times  daily with a meal. 01/31/23   Cala Castleman, MD  cyclobenzaprine  (FLEXERIL ) 10 MG tablet SMARTSIG:1 Tablet(s) By Mouth Every 12 Hours 03/29/23   [provider]  oxyCODONE -acetaminophen  (PERCOCET ) 10-325 MG tablet SMARTSIG:1 Tablet(s) By Mouth Every 12 Hours 12/20/22   [provider]  traZODone  (DESYREL ) 50 MG tablet Take 1 tablet (50 mg total) by mouth at bedtime as needed for sleep. 05/05/23   Burnette Carte, MD    Family History Family History  Problem Relation Age of Onset   Diabetes type II Other    Asthma Mother     Social History Social History   Tobacco Use   Smoking status: Former    Current packs/day: 0.00    Average packs/day: 0.2 packs/day for 2.0 years (0.3 ttl pk-yrs)    Types: Cigarettes    Start date: 07/28/2010    Quit date: 07/27/2012    Years since quitting: 11.0   Smokeless tobacco: Never  Vaping Use   Vaping status: Never Used  Substance Use Topics   Alcohol use: No   Drug use: Not Currently    Frequency: 7.0 times per week    Types: Marijuana     Allergies   Ketorolac tromethamine, Latex, Lortab [hydrocodone -acetaminophen ], and Tramadol   Review of Systems Review of Systems See HPI for open ROS.  Physical Exam Triage Vital Signs ED Triage Vitals  Encounter Vitals Group     BP 08/12/23 1600 (!) 136/91     Girls Systolic BP Percentile --      Girls Diastolic BP Percentile --      Boys Systolic BP Percentile --      Boys Diastolic BP Percentile --      Pulse Rate 08/12/23 1600 100     Resp 08/12/23 1600 (!) 24     Temp 08/12/23 1600 98.1 F (36.7 C)     Temp Source 08/12/23 1600 Oral     SpO2 08/12/23 1600 97 %     Weight --      Height --      Head Circumference --      Peak Flow --      Pain Score 08/12/23 1601 8     Pain Loc --      Pain Education --      Exclude from Growth Chart --    No data found.  Updated Vital Signs BP (!) 136/91 (BP Location: Left Arm)   Pulse 100   Temp 98.1 F (36.7 C) (Oral)    Resp (!) 24   SpO2 97%   Visual Acuity Right Eye Distance:   Left  Eye Distance:   Bilateral Distance:    Right Eye Near:   Left Eye Near:    Bilateral Near:     Physical Exam General: Alert and oriented, well-developed/well-nourished, calm, cooperative, no acute distress HEENT: Normocephalic atraumatic, moist mucous membranes, no scleral icterus, trachea midline Lungs: Speaking full sentences, non-labored respirations, no distress, clear to auscultation bilaterally Heart: Regular rate and rhythm Abdomen:  Soft, nondistended Musculoskeletal: Moves all extremities well Neurologic: Awake, A&O x4, gait normal Integumentary: Warm, dry, normal for ethnicity, intact, no rash Psychiatric: Appropriate mood & affect  UC Treatments / Results  Labs (all labs ordered are listed, but only abnormal results are displayed) Labs Reviewed - No data to display  EKG   Radiology No results found.  Procedures Procedures (including critical care time)  Medications Ordered in UC Medications - No data to display  Initial Impression / Assessment and Plan / UC Course  I have reviewed the triage vital signs and the nursing notes.  Pertinent labs & imaging results that were available during my care of the patient were reviewed by me and considered in my medical decision making (see chart for details).    Presents with cough, congestion, sinus pressure, sinus pain, and rib pain.  Differential diagnosis includes: URI, COVID, influenza, sinusitis, pneumonia, CHF or COPD exacerbation, sinusitis, allergies, including other diagnoses.  History obtained from: Patient.  Plan: Patient presents with continued cough and sinus pressure and pain.  Her symptoms have been going on for about a month.  Given the duration, patient meets criteria for possible bacterial sinusitis.  Prescribe patient Augmentin  for coverage of bacterial sinusitis and added azithromycin  for coverage of community-acquired pneumonia  given patient's risk factors with history smoking and asthma. I considered, but think unlikely, dangerous causes of this patient's symptoms to include ACS, CHF or COPD exacerbations, or pneumothorax. Patient is nontoxic appearing, stable, and in no acute distress, therefore, patient likely stable for safe discharge home. Gave patient recommendations for over-the-counter medicines and remedies for symptomatic relief.  Additionally, prescribed patient fluconazole  in case she develops a yeast infection, benzonatate  and promethazine  cough syrup for her cough. Patient should follow-up with their primary care provider in the next several days.  Strict return precautions to the urgent care or emergency department were discussed including if symptoms worsen, if they become short of breath, or if they have any other concerns.  Disposition: Stable to discharge home.   All questions answered to the best of this examiner's ability. Advised to f/u with PCP for further eval and/or reassessment. Patient agrees to plan.  An appropriate evaluation has been performed, and in my medical judgment there is currently no evidence of an immediate life-threatening or surgical condition. Discharge is therefore indicated at this time.  This document was created using the aid of voice recognition Scientist, clinical (histocompatibility and immunogenetics).  Final Clinical Impressions(s) / UC Diagnoses   Final diagnoses:  Acute cough  Acute frontal sinusitis, recurrence not specified     Discharge Instructions      We have sent in multiple medications to help with your symptoms.  We have also sent in a medication in case you develop a yeast infection.  We recommend following up with your primary care provider if symptoms are not improving.  Please return or go to the emergency department if you have any chest pain, shortness of breath, worsening of symptoms, or if you have any other concerns.    ED Prescriptions     Medication Sig Dispense Auth.  Provider   amoxicillin -clavulanate (AUGMENTIN ) 875-125 MG tablet Take 1 tablet by mouth every 12 (twelve) hours. 14 tablet Edna Gouty, PA-C   azithromycin  (ZITHROMAX ) 250 MG tablet Take 1 tablet (250 mg total) by mouth daily. Take first 2 tablets together, then 1 every day until finished. 6 tablet Edna Gouty, PA-C   benzonatate  (TESSALON ) 100 MG capsule Take 1 capsule (100 mg total) by mouth every 8 (eight) hours. 21 capsule Edna Gouty, PA-C   fluconazole  (DIFLUCAN ) 150 MG tablet Take 1 tablet (150 mg total) by mouth daily. Take at the first sign of yeast infection, if still experiencing symptoms 72 hours later, you can take the second pill 2 tablet Edna Gouty, PA-C   promethazine -dextromethorphan (PROMETHAZINE -DM) 6.25-15 MG/5ML syrup Take 5 mLs by mouth 4 (four) times daily as needed for cough. 118 mL Edna Gouty, PA-C      PDMP not reviewed this encounter.   Edna Gouty, PA-C 08/12/23 1648

## 2023-09-24 ENCOUNTER — Emergency Department (HOSPITAL_COMMUNITY)
Admission: EM | Admit: 2023-09-24 | Discharge: 2023-09-24 | Disposition: A | Discharge status: Home / Self Care | Payer: MEDICAID | Attending: Emergency Medicine | Admitting: Emergency Medicine

## 2023-09-24 ENCOUNTER — Encounter (HOSPITAL_COMMUNITY): Payer: Self-pay | Admitting: Emergency Medicine

## 2023-09-24 ENCOUNTER — Emergency Department (HOSPITAL_COMMUNITY): Payer: MEDICAID

## 2023-09-24 DIAGNOSIS — N3000 Acute cystitis without hematuria: Secondary | ICD-10-CM | POA: Diagnosis not present

## 2023-09-24 DIAGNOSIS — I10 Essential (primary) hypertension: Secondary | ICD-10-CM | POA: Diagnosis not present

## 2023-09-24 DIAGNOSIS — Z79899 Other long term (current) drug therapy: Secondary | ICD-10-CM | POA: Insufficient documentation

## 2023-09-24 DIAGNOSIS — R519 Headache, unspecified: Secondary | ICD-10-CM | POA: Insufficient documentation

## 2023-09-24 DIAGNOSIS — Z91040 Latex allergy status: Secondary | ICD-10-CM | POA: Diagnosis not present

## 2023-09-24 DIAGNOSIS — Z4800 Encounter for change or removal of nonsurgical wound dressing: Secondary | ICD-10-CM | POA: Insufficient documentation

## 2023-09-24 DIAGNOSIS — G629 Polyneuropathy, unspecified: Secondary | ICD-10-CM

## 2023-09-24 DIAGNOSIS — A5901 Trichomonal vulvovaginitis: Secondary | ICD-10-CM | POA: Insufficient documentation

## 2023-09-24 DIAGNOSIS — R102 Pelvic and perineal pain: Secondary | ICD-10-CM | POA: Diagnosis present

## 2023-09-24 LAB — CBC WITH DIFFERENTIAL/PLATELET
Abs Immature Granulocytes: 0.02 K/uL (ref 0.00–0.07)
Basophils Absolute: 0 K/uL (ref 0.0–0.1)
Basophils Relative: 0 %
Eosinophils Absolute: 0 K/uL (ref 0.0–0.5)
Eosinophils Relative: 1 %
HCT: 40 % (ref 36.0–46.0)
Hemoglobin: 12.5 g/dL (ref 12.0–15.0)
Immature Granulocytes: 0 %
Lymphocytes Relative: 44 %
Lymphs Abs: 3.7 K/uL (ref 0.7–4.0)
MCH: 30.6 pg (ref 26.0–34.0)
MCHC: 31.3 g/dL (ref 30.0–36.0)
MCV: 97.8 fL (ref 80.0–100.0)
Monocytes Absolute: 0.5 K/uL (ref 0.1–1.0)
Monocytes Relative: 6 %
Neutro Abs: 4.1 K/uL (ref 1.7–7.7)
Neutrophils Relative %: 49 %
Platelets: 386 K/uL (ref 150–400)
RBC: 4.09 MIL/uL (ref 3.87–5.11)
RDW: 13.3 % (ref 11.5–15.5)
WBC: 8.4 K/uL (ref 4.0–10.5)
nRBC: 0 % (ref 0.0–0.2)

## 2023-09-24 LAB — I-STAT CG4 LACTIC ACID, ED: Lactic Acid, Venous: 1.6 mmol/L (ref 0.5–1.9)

## 2023-09-24 LAB — COMPREHENSIVE METABOLIC PANEL WITH GFR
ALT: 16 U/L (ref 0–44)
AST: 22 U/L (ref 15–41)
Albumin: 4.1 g/dL (ref 3.5–5.0)
Alkaline Phosphatase: 93 U/L (ref 38–126)
Anion gap: 14 (ref 5–15)
BUN: 17 mg/dL (ref 6–20)
CO2: 19 mmol/L — ABNORMAL LOW (ref 22–32)
Calcium: 9.6 mg/dL (ref 8.9–10.3)
Chloride: 107 mmol/L (ref 98–111)
Creatinine, Ser: 0.8 mg/dL (ref 0.44–1.00)
GFR, Estimated: >60 mL/min (ref >60)
Glucose, Bld: 100 mg/dL — ABNORMAL HIGH (ref 70–99)
Potassium: 4.4 mmol/L (ref 3.5–5.1)
Sodium: 140 mmol/L (ref 135–145)
Total Bilirubin: 0.8 mg/dL (ref 0.0–1.2)
Total Protein: 8.6 g/dL — ABNORMAL HIGH (ref 6.5–8.1)

## 2023-09-24 LAB — URINALYSIS, ROUTINE W REFLEX MICROSCOPIC
Bacteria, UA: NONE SEEN
Bilirubin Urine: NEGATIVE
Glucose, UA: NEGATIVE mg/dL
Ketones, ur: NEGATIVE mg/dL
Nitrite: NEGATIVE
Protein, ur: 30 mg/dL — AB
Specific Gravity, Urine: 1.027 (ref 1.005–1.030)
pH: 5 (ref 5.0–8.0)

## 2023-09-24 LAB — I-STAT CHEM 8, ED
BUN: 23 mg/dL — ABNORMAL HIGH (ref 6–20)
Calcium, Ion: 0.91 mmol/L — ABNORMAL LOW (ref 1.15–1.40)
Chloride: 111 mmol/L (ref 98–111)
Creatinine, Ser: 0.9 mg/dL (ref 0.44–1.00)
Glucose, Bld: 97 mg/dL (ref 70–99)
HCT: 40 % (ref 36.0–46.0)
Hemoglobin: 13.6 g/dL (ref 12.0–15.0)
Potassium: 7.7 mmol/L (ref 3.5–5.1)
Sodium: 136 mmol/L (ref 135–145)
TCO2: 26 mmol/L (ref 22–32)

## 2023-09-24 LAB — PREGNANCY, URINE: Preg Test, Ur: NEGATIVE

## 2023-09-24 LAB — WET PREP, GENITAL
Clue Cells Wet Prep HPF POC: NONE SEEN
Sperm: NONE SEEN
WBC, Wet Prep HPF POC: >=10 — AB (ref <10)
Yeast Wet Prep HPF POC: NONE SEEN

## 2023-09-24 LAB — RAPID URINE DRUG SCREEN, HOSP PERFORMED
Amphetamines: NOT DETECTED
Barbiturates: NOT DETECTED
Benzodiazepines: NOT DETECTED
Cocaine: POSITIVE — AB
Opiates: NOT DETECTED
Tetrahydrocannabinol: POSITIVE — AB

## 2023-09-24 LAB — PROTIME-INR
INR: 1 (ref 0.8–1.2)
Prothrombin Time: 13.4 s (ref 11.4–15.2)

## 2023-09-24 LAB — APTT: aPTT: <22 s — ABNORMAL LOW (ref 24–36)

## 2023-09-24 LAB — CBG MONITORING, ED: Glucose-Capillary: 99 mg/dL (ref 70–99)

## 2023-09-24 MED ORDER — ONDANSETRON HCL 4 MG PO TABS
8.0000 mg | ORAL_TABLET | Freq: Once | ORAL | Status: AC
  Administered 2023-09-24: 8 mg via ORAL
  Filled 2023-09-24: qty 2

## 2023-09-24 MED ORDER — METRONIDAZOLE 500 MG PO TABS
500.0000 mg | ORAL_TABLET | Freq: Once | ORAL | Status: AC
Start: 2023-09-24 — End: 2023-09-24
  Administered 2023-09-24: 500 mg via ORAL
  Filled 2023-09-24: qty 1

## 2023-09-24 MED ORDER — ARIPIPRAZOLE 10 MG PO TABS: 10.0000 mg | ORAL_TABLET | Freq: Every day | ORAL | 0 refills | Status: DC

## 2023-09-24 MED ORDER — TRAZODONE HCL 50 MG PO TABS: 50.0000 mg | ORAL_TABLET | Freq: Every day | ORAL | 0 refills | Status: DC

## 2023-09-24 MED ORDER — TRAZODONE HCL 50 MG PO TABS
50.0000 mg | ORAL_TABLET | Freq: Every day | ORAL | 0 refills | Status: AC
Start: 2023-09-24 — End: ?

## 2023-09-24 MED ORDER — METRONIDAZOLE 500 MG PO TABS: 500.0000 mg | ORAL_TABLET | Freq: Two times a day (BID) | ORAL | 0 refills | Status: AC

## 2023-09-24 MED ORDER — LABETALOL HCL 5 MG/ML IV SOLN
20.0000 mg | Freq: Once | INTRAVENOUS | Status: DC
  Filled 2023-09-24: qty 4

## 2023-09-24 MED ORDER — IBUPROFEN 200 MG PO TABS
600.0000 mg | ORAL_TABLET | Freq: Once | ORAL | Status: AC
  Administered 2023-09-24: 600 mg via ORAL
  Filled 2023-09-24: qty 3

## 2023-09-24 MED ORDER — NICARDIPINE HCL IN NACL 20-0.86 MG/200ML-% IV SOLN
0.0000 mg/h | INTRAVENOUS | Status: DC
  Filled 2023-09-24: qty 200

## 2023-09-24 MED ORDER — ARIPIPRAZOLE 10 MG PO TABS: 10.0000 mg | ORAL_TABLET | Freq: Every day | ORAL | 0 refills | Status: AC

## 2023-09-24 MED ORDER — CEPHALEXIN 250 MG PO CAPS
250.0000 mg | ORAL_CAPSULE | Freq: Once | ORAL | Status: AC
  Administered 2023-09-24: 250 mg via ORAL
  Filled 2023-09-24: qty 1

## 2023-09-24 MED ORDER — CEPHALEXIN 500 MG PO CAPS: 500.0000 mg | ORAL_CAPSULE | Freq: Four times a day (QID) | ORAL | 0 refills | Status: DC

## 2023-09-24 MED ORDER — AMLODIPINE BESYLATE 10 MG PO TABS
10.0000 mg | ORAL_TABLET | Freq: Every day | ORAL | 0 refills | Status: AC
Start: 2023-09-24 — End: ?

## 2023-09-24 MED ORDER — CEPHALEXIN 500 MG PO CAPS: 500.0000 mg | ORAL_CAPSULE | Freq: Four times a day (QID) | ORAL | 0 refills | Status: AC

## 2023-09-24 MED ORDER — AMLODIPINE BESYLATE 5 MG PO TABS: 10.0000 mg | ORAL_TABLET | Freq: Once | ORAL | Status: DC

## 2023-09-24 MED ORDER — AMLODIPINE BESYLATE 10 MG PO TABS: 10.0000 mg | ORAL_TABLET | Freq: Every day | ORAL | 0 refills | Status: DC

## 2023-09-24 MED ORDER — METRONIDAZOLE 500 MG PO TABS
500.0000 mg | ORAL_TABLET | Freq: Two times a day (BID) | ORAL | 0 refills | Status: DC
Start: 2023-09-24 — End: 2023-09-24

## 2023-09-24 NOTE — ED Triage Notes (Addendum)
 Pt comes in she is out of psych meds and heart meds hasn't been able to get meds yet. She has been more tearful. Denies si denies hi. Finished meds from urgent care states she is worried about yeast infection or vaginal bacterial infection

## 2023-09-24 NOTE — ED Notes (Addendum)
 RN went into room to collect labs. Patient stated at 1:30pm she began having weakness in her right hand and arm that feels numb. She said this felt the same way the last time she had a stroke in December. PA Josh called to bedside. Stroke cart activated at 1:37pm. Code stroke called at 1:35pm.

## 2023-09-24 NOTE — ED Notes (Signed)
 MRI called results to Dr. Matthews at 2:34pm.

## 2023-09-24 NOTE — ED Provider Notes (Signed)
 [place] EMERGENCY DEPARTMENT AT [facility] Provider Note   CSN: [id number] Arrival date & time: 09/24/23  1139  An emergency department physician performed an initial assessment on this suspected stroke patient at 1337.  Patient presents with: Medication Refill and Vaginal Itching   [name] is a 38 y.o. female whose primary reason for presenting to the ED today was due to request for medication refill for her psychiatric medications.  She has a history of depression, epilepsy, and schizoaffective disorder.  Previously was prescribed aripiprazole  for same, also using trazodone  for sleep.  Has not had these in several months secondary to her primary psychologist retiring and not having a replacement for her primary provider.  She also has complaints of of pelvic cramping/vaginal itching that has been persistent over the last several weeks, has thick white vaginal discharge, placed on antibiotics in June 2025 for upper respiratory infection.  Also treated for vaginal candidiasis with fluconazole  x 2, yet still continues to have discharge despite this.  During evaluation patient did endorse having some black spots in her vision, noted to be hypertensive during the assessment, is previously been prescribed amlodipine  and carvedilol  for her blood pressure however she has not been taking these medications as again she has not been able to follow-up with primary care on this.  Denies having any chest pain or shortness of breath at present.  During assessment does endorse photophobia.    Medication Refill Vaginal Itching Associated symptoms include headaches.       Prior to Admission medications   Medication Sig Start Date End Date Taking? Authorizing Provider  amLODipine  (NORVASC ) 10 MG tablet Take 1 tablet (10 mg total) by mouth daily. 09/24/23  Yes Myriam Dorn BROCKS, PA  ARIPiprazole  (ABILIFY ) 10 MG tablet Take 1 tablet (10 mg total) by mouth daily. 09/24/23  Yes  Myriam Dorn BROCKS, PA  cephALEXin  (KEFLEX ) 500 MG capsule Take 1 capsule (500 mg total) by mouth 4 (four) times daily. 09/24/23  Yes Myriam Dorn BROCKS, PA  metroNIDAZOLE  (FLAGYL ) 500 MG tablet Take 1 tablet (500 mg total) by mouth 2 (two) times daily. 09/24/23  Yes Myriam Dorn BROCKS, PA  traZODone  (DESYREL ) 50 MG tablet Take 1 tablet (50 mg total) by mouth at bedtime. 09/24/23  Yes Myriam Dorn BROCKS, PA  acetaminophen  (TYLENOL ) 325 MG tablet Take 650 mg by mouth every 6 (six) hours as needed for mild pain or headache.    [provider]  albuterol  (PROVENTIL ) (2.5 MG/3ML) 0.083% nebulizer solution Take 3 mLs (2.5 mg total) by nebulization every 6 (six) hours as needed for wheezing or shortness of breath. 12/07/22   Johnie Flaming A, NP  amLODipine  (NORVASC ) 10 MG tablet Take 1 tablet (10 mg total) by mouth daily. 02/01/23   Singh, Prashant K, MD  amoxicillin -clavulanate (AUGMENTIN ) 875-125 MG tablet Take 1 tablet by mouth every 12 (twelve) hours. 08/12/23   Melonie Locus, PA-C  ARIPiprazole  (ABILIFY ) 10 MG tablet Take 1 tablet (10 mg total) by mouth daily. 05/05/23   Laurice Maude BROCKS, MD  azithromycin  (ZITHROMAX ) 250 MG tablet Take 1 tablet (250 mg total) by mouth daily. Take first 2 tablets together, then 1 every day until finished. 08/12/23   Melonie Locus, PA-C  benzonatate  (TESSALON ) 100 MG capsule Take 1 capsule (100 mg total) by mouth every 8 (eight) hours. 08/12/23   Melonie Locus, PA-C  carvedilol  (COREG ) 6.25 MG tablet Take 1 tablet (6.25 mg total) by mouth 2 (two) times daily with a meal.  01/31/23   Singh, Prashant K, MD  cyclobenzaprine  (FLEXERIL ) 10 MG tablet SMARTSIG:1 Tablet(s) By Mouth Every 12 Hours 03/29/23   [provider]  fluconazole  (DIFLUCAN ) 150 MG tablet Take 1 tablet (150 mg total) by mouth daily. Take at the first sign of yeast infection, if still experiencing symptoms 72 hours later, you can take the second pill 08/12/23   Melonie Locus, PA-C   hydrochlorothiazide  (HYDRODIURIL ) 25 MG tablet Take 25 mg by mouth every morning. 05/30/23   [provider]  oxyCODONE -acetaminophen  (PERCOCET ) 10-325 MG tablet SMARTSIG:1 Tablet(s) By Mouth Every 12 Hours 12/20/22   [provider]  promethazine -dextromethorphan (PROMETHAZINE -DM) 6.25-15 MG/5ML syrup Take 5 mLs by mouth 4 (four) times daily as needed for cough. 08/12/23   Melonie Locus, PA-C  traZODone  (DESYREL ) 50 MG tablet Take 1 tablet (50 mg total) by mouth at bedtime as needed for sleep. 05/05/23   Laurice Maude BROCKS, MD    Allergies: Ketorolac tromethamine, Latex, Lortab [hydrocodone -acetaminophen ], and Tramadol    Review of Systems  Eyes:  Positive for photophobia and visual disturbance.  Genitourinary:  Positive for pelvic pain and urgency. Negative for dysuria.  Neurological:  Positive for dizziness, weakness and headaches.  All other systems reviewed and are negative.   Updated Vital Signs BP (!) 141/90   Pulse 92   Temp 97.9 F (36.6 C) (Oral)   Resp (!) 23   Ht 5' 5 (1.651 m)   Wt (!) 138.8 kg   SpO2 97%   BMI 50.92 kg/m   Physical Exam Vitals and nursing note reviewed.  Constitutional:      General: She is not in acute distress.    Appearance: Normal appearance.  HENT:     Head: Normocephalic and atraumatic.     Mouth/Throat:     Mouth: Mucous membranes are moist.     Pharynx: Oropharynx is clear.  Eyes:     General: Lids are normal. Vision grossly intact. Gaze aligned appropriately. No visual field deficit.    Extraocular Movements: Extraocular movements intact.     Conjunctiva/sclera: Conjunctivae normal.     Pupils: Pupils are equal, round, and reactive to light.     Comments: Unable to adequately assess funduscopic exam secondary to photophobia.  Cardiovascular:     Rate and Rhythm: Normal rate and regular rhythm.     Pulses: Normal pulses.     Heart sounds: Normal heart sounds. No murmur heard.    No friction rub. No gallop.   Pulmonary:     Effort: Pulmonary effort is normal.     Breath sounds: Normal breath sounds.  Abdominal:     General: Abdomen is flat. Bowel sounds are normal.     Palpations: Abdomen is soft.     Tenderness: There is generalized abdominal tenderness.  Musculoskeletal:        General: Normal range of motion.     Cervical back: Normal range of motion and neck supple.     Right lower leg: No edema.     Left lower leg: No edema.  Skin:    General: Skin is warm and dry.     Capillary Refill: Capillary refill takes less than 2 seconds.  Neurological:     General: No focal deficit present.     Mental Status: She is alert and oriented to person, place, and time. Mental status is at baseline.     GCS: GCS eye subscore is 4. GCS verbal subscore is 5. GCS motor subscore is 6.  Cranial Nerves: No cranial nerve deficit, dysarthria or facial asymmetry.     Motor: Motor function is intact.     Coordination: Coordination is intact.     Gait: Gait is intact.  Psychiatric:        Mood and Affect: Mood normal.     (all labs ordered are listed, but only abnormal results are displayed) Labs Reviewed  WET PREP, GENITAL - Abnormal; Notable for the following components:      Result Value   Trich, Wet Prep PRESENT (*)    WBC, Wet Prep HPF POC >=10 (*)    All other components within normal limits  URINALYSIS, ROUTINE W REFLEX MICROSCOPIC - Abnormal; Notable for the following components:   APPearance HAZY (*)    Hgb urine dipstick SMALL (*)    Protein, ur 30 (*)    Leukocytes,Ua LARGE (*)    All other components within normal limits  COMPREHENSIVE METABOLIC PANEL WITH GFR - Abnormal; Notable for the following components:   CO2 19 (*)    Glucose, Bld 100 (*)    Total Protein 8.6 (*)    All other components within normal limits  APTT - Abnormal; Notable for the following components:   aPTT <22 (*)    All other components within normal limits  RAPID URINE DRUG SCREEN, HOSP PERFORMED -  Abnormal; Notable for the following components:   Cocaine POSITIVE (*)    Tetrahydrocannabinol POSITIVE (*)    All other components within normal limits  I-STAT CHEM 8, ED - Abnormal; Notable for the following components:   Potassium 7.7 (*)    BUN 23 (*)    Calcium , Ion 0.91 (*)    All other components within normal limits  PREGNANCY, URINE  CBC WITH DIFFERENTIAL/PLATELET  PROTIME-INR  CBG MONITORING, ED  I-STAT CG4 LACTIC ACID, ED  GC/CHLAMYDIA PROBE AMP ([place]) NOT AT [facility]    EKG: None  Radiology: MR BRAIN WO CONTRAST Result Date: 09/24/2023 CLINICAL DATA:  Neuro deficit, acute, stroke suspected. Right-sided numbness and weakness. EXAM: MRI HEAD WITHOUT CONTRAST TECHNIQUE: Multiplanar, multiecho pulse sequences of the brain and surrounding structures were obtained without intravenous contrast. COMPARISON:  Head CT 09/24/2023 and MRI 01/29/2023 FINDINGS: Brain: There is no evidence of an acute infarct, intracranial hemorrhage, mass, midline shift, or extra-axial fluid collection. Several scattered punctate foci of T2 FLAIR hyperintensity in the predominantly subcortical white matter of both cerebral hemispheres are similar to the prior MRI. Cerebral volume is normal. The ventricles are normal in size. An enlarged, partially empty sella is again noted. There is no significant cerebellar tonsillar ectopia. Vascular: Major intracranial vascular flow voids are preserved. Skull and upper cervical spine: Diffuse hyperostosis. No suspicious marrow lesion. Sinuses/Orbits: Unremarkable orbits. Paranasal sinuses and mastoid air cells are clear. Unremarkable orbits. Other: None. IMPRESSION: 1. No acute intracranial abnormality. 2. Minimal cerebral white matter T2 hyperintensities, nonspecific though may reflect early chronic small vessel ischemia, migraines, or prior infection/inflammation. 3. Partially empty sella, often reflecting normal anatomic variation though can also be seen with idiopathic  intracranial hypertension. Electronically Signed   By: Dasie Hamburg M.D.   On: 09/24/2023 15:34   CT HEAD CODE STROKE WO CONTRAST Result Date: 09/24/2023 CLINICAL DATA:  Provided history: Stroke code. Neuro deficit, acute, stroke suspected. Right-sided weakness. EXAM: CT HEAD WITHOUT CONTRAST TECHNIQUE: Contiguous axial images were obtained from the base of the skull through the vertex without intravenous contrast. RADIATION DOSE REDUCTION: This exam was performed according to the departmental dose-optimization  program which includes automated exposure control, adjustment of the mA and/or kV according to patient size and/or use of iterative reconstruction technique. COMPARISON:  Brain MRI 01/29/2023.  Head CT 01/28/2023. FINDINGS: Brain: Cerebral volume is normal. Expanded and partially empty sella turcica. There is no acute intracranial hemorrhage. No demarcated cortical infarct. No extra-axial fluid collection. No evidence of an intracranial mass. No midline shift. Vascular: No hyperdense vessel. Skull: No calvarial fracture or aggressive osseous lesion. Sinuses/Orbits: No mass or acute finding within the imaged orbits. No significant paranasal sinus disease. ASPECTS [facility] Stroke Program Early CT Score) - Ganglionic level infarction (caudate, lentiform nuclei, internal capsule, insula, M1-M3 cortex): 7 - Supraganglionic infarction (M4-M6 cortex): 3 Total score (0-10 with 10 being normal): 10 No evidence of an acute intracranial abnormality. These results were communicated to Dr. Matthews at 2:16 pmon 7/29/2025by text page via the [facility] messaging system. IMPRESSION: 1. No evidence of an acute intracranial abnormality. 2. Expanded and partially empty sella turcica. This finding can reflect incidental anatomic variation, or alternatively, it can be associated with chronic idiopathic intracranial hypertension (pseudotumor cerebri). Electronically Signed   By: Rockey Childs D.O.   On: 09/24/2023 14:16      Procedures   Medications Ordered in the ED  metroNIDAZOLE  (FLAGYL ) tablet 500 mg (has no administration in time range)  cephALEXin  (KEFLEX ) capsule 250 mg (has no administration in time range)  ondansetron  (ZOFRAN ) tablet 8 mg (has no administration in time range)  ibuprofen  (ADVIL ) tablet 600 mg (has no administration in time range)    Clinical Course as of 09/24/23 1917  Tue Sep 24, 2023  1349 Code stroke activated for acute right upper extremity weakness and numbness.  Patient does have a history of stroke and this feels similar to prior stroke.  Dr. Matthews (neurology) is assessing the patient currently via telemetry neurology.  I have placed a left forearm peripheral ultrasound IV for access. [MP]  1448 MRI reviewed w neurology. No acute stroke [MP]    Clinical Course User Index [MP] Pamella Ozell LABOR, DO                                 Medical Decision Making Amount and/or Complexity of Data Reviewed Labs: ordered. Radiology: ordered.  Risk OTC drugs. Prescription drug management.   Medical Decision Making:   [name] is a 38 y.o. female who presented to the ED today with multiple complaints including concerns for medication refills as well as has pelvic pain and visual deficits.  Detailed above.    External chart has been reviewed including previous visits, previous labs and imaging.. Patient's presentation is complicated by their history of schizoaffective disorder, hypertension.  Patient placed on continuous vitals and telemetry monitoring while in ED which was reviewed periodically.  Complete initial physical exam performed, notably the patient  was alert and oriented in no apparent distress.  Notable exam finding of profound photophobia during funduscopic exam.  Abdomen is diffusely tender without any rebound tenderness..    Reviewed and confirmed nursing documentation for past medical history, family history, social history.    Initial Assessment:    With the patient's presentation of pelvic pain/med request, most likely diagnosis is persistent and untreated essential hypertension, vulvar candidiasis, bacterial vaginosis, hypertensive retinopathy, nephropathy secondary to untreated hypertension.   Initial Plan:  After self swabs to assess for GC/chlamydia as well as for wet prep. Screening labs including CBC and Metabolic  panel to evaluate for infectious or metabolic etiology of disease.  Urinalysis with reflex culture ordered to evaluate for UTI or relevant urologic/nephrologic pathology.  CXR to evaluate for structural/infectious intrathoracic pathology.  EKG to evaluate for cardiac pathology Objective evaluation as below reviewed   Initial Study Results:   Laboratory  All laboratory results reviewed without evidence of clinically relevant pathology.   Exceptions include: Hazy appearing urine with leukocytes and proteinuria.  Wet prep does show presence of trichomoniasis as well as increased white cell count.  UDS shows cocaine and THC.  EKG EKG was reviewed independently. Rate, rhythm, axis, intervals all examined and without medically relevant abnormality. ST segments without concerns for elevations.    Radiology:  All images reviewed independently. Agree with radiology report at this time.   MR BRAIN WO CONTRAST Result Date: 09/24/2023 CLINICAL DATA:  Neuro deficit, acute, stroke suspected. Right-sided numbness and weakness. EXAM: MRI HEAD WITHOUT CONTRAST TECHNIQUE: Multiplanar, multiecho pulse sequences of the brain and surrounding structures were obtained without intravenous contrast. COMPARISON:  Head CT 09/24/2023 and MRI 01/29/2023 FINDINGS: Brain: There is no evidence of an acute infarct, intracranial hemorrhage, mass, midline shift, or extra-axial fluid collection. Several scattered punctate foci of T2 FLAIR hyperintensity in the predominantly subcortical white matter of both cerebral hemispheres are similar to the prior MRI.  Cerebral volume is normal. The ventricles are normal in size. An enlarged, partially empty sella is again noted. There is no significant cerebellar tonsillar ectopia. Vascular: Major intracranial vascular flow voids are preserved. Skull and upper cervical spine: Diffuse hyperostosis. No suspicious marrow lesion. Sinuses/Orbits: Unremarkable orbits. Paranasal sinuses and mastoid air cells are clear. Unremarkable orbits. Other: None. IMPRESSION: 1. No acute intracranial abnormality. 2. Minimal cerebral white matter T2 hyperintensities, nonspecific though may reflect early chronic small vessel ischemia, migraines, or prior infection/inflammation. 3. Partially empty sella, often reflecting normal anatomic variation though can also be seen with idiopathic intracranial hypertension. Electronically Signed   By: Dasie Hamburg M.D.   On: 09/24/2023 15:34   CT HEAD CODE STROKE WO CONTRAST Result Date: 09/24/2023 CLINICAL DATA:  Provided history: Stroke code. Neuro deficit, acute, stroke suspected. Right-sided weakness. EXAM: CT HEAD WITHOUT CONTRAST TECHNIQUE: Contiguous axial images were obtained from the base of the skull through the vertex without intravenous contrast. RADIATION DOSE REDUCTION: This exam was performed according to the departmental dose-optimization program which includes automated exposure control, adjustment of the mA and/or kV according to patient size and/or use of iterative reconstruction technique. COMPARISON:  Brain MRI 01/29/2023.  Head CT 01/28/2023. FINDINGS: Brain: Cerebral volume is normal. Expanded and partially empty sella turcica. There is no acute intracranial hemorrhage. No demarcated cortical infarct. No extra-axial fluid collection. No evidence of an intracranial mass. No midline shift. Vascular: No hyperdense vessel. Skull: No calvarial fracture or aggressive osseous lesion. Sinuses/Orbits: No mass or acute finding within the imaged orbits. No significant paranasal sinus disease.  ASPECTS [facility] Stroke Program Early CT Score) - Ganglionic level infarction (caudate, lentiform nuclei, internal capsule, insula, M1-M3 cortex): 7 - Supraganglionic infarction (M4-M6 cortex): 3 Total score (0-10 with 10 being normal): 10 No evidence of an acute intracranial abnormality. These results were communicated to Dr. Matthews at 2:16 pmon 7/29/2025by text page via the [facility] messaging system. IMPRESSION: 1. No evidence of an acute intracranial abnormality. 2. Expanded and partially empty sella turcica. This finding can reflect incidental anatomic variation, or alternatively, it can be associated with chronic idiopathic intracranial hypertension (pseudotumor cerebri). Electronically Signed   By: Rockey  Richelle D.O.   On: 09/24/2023 14:16      Consults: Case discussed with neurology for code stroke.   Reassessment and Plan:   After initial assessment, notified by nursing that patient began to have sudden onset of right sided weakness and numbness.  On reassessment of this patient she had profound numbness to light touch and painful stimuli on the right upper extremity all the way to the shoulder.  Also had pronator drift to the right.  Began to demonstrate dysarthria.  Given this, initiated code stroke along with associated workup.  Extensive assessment done for this patient did not demonstrate that she had any acute stroke.  Further, blood work did not show any abnormalities.  Urine did show positive for potential UTI with leukocyte esterase present.  As such, will begin treatment for UTI with Keflex .  Wet prep does show trichomonas as well as white blood cells.  Consistent with trichomonal infection, will manage outpatient course of Flagyl .  In regards to her chronic medications, will begin represcribe outpatient course of aripiprazole , trazodone , as well as for her blood pressure continue amlodipine .  Encouraged follow-up with her primary care within the next 1 to 2 weeks for following of her  condition and reassessment.  During the course of her stay, she remained hypertensive her blood pressure did decrease to acceptable limits without any relevant sequelae.  As such see this patient stable for discharge without further treatment needed beyond outpatient course already in progress.       Final diagnoses:  Trichomonas vaginalis (TV) infection  Acute cystitis without hematuria    ED Discharge Orders          Ordered    metroNIDAZOLE  (FLAGYL ) 500 MG tablet  2 times daily        09/24/23 1903    cephALEXin  (KEFLEX ) 500 MG capsule  4 times daily        09/24/23 1904    ARIPiprazole  (ABILIFY ) 10 MG tablet  Daily        09/24/23 1911    amLODipine  (NORVASC ) 10 MG tablet  Daily        09/24/23 1911    traZODone  (DESYREL ) 50 MG tablet  Daily at bedtime        09/24/23 1911               Myriam Dorn BROCKS, PA 09/24/23 1920    Pamella Ozell LABOR, DO 10/05/23 1641

## 2023-09-24 NOTE — ED Notes (Signed)
 Patient transported to MRI

## 2023-09-24 NOTE — ED Notes (Signed)
 Patient transported to CT

## 2023-09-24 NOTE — ED Notes (Signed)
 Patient requesting nausea medication and to be admitted.

## 2023-09-25 LAB — GC/CHLAMYDIA PROBE AMP (~~LOC~~) NOT AT ARMC
Chlamydia: NEGATIVE
Comment: NEGATIVE
Comment: NORMAL
Neisseria Gonorrhea: NEGATIVE

## 2023-11-29 ENCOUNTER — Other Ambulatory Visit (HOSPITAL_COMMUNITY): Payer: Self-pay

## 2023-11-29 MED ORDER — AMLODIPINE BESYLATE 10 MG PO TABS
10.0000 mg | ORAL_TABLET | Freq: Every day | ORAL | 2 refills | Status: AC
  Filled 2023-11-29: qty 30, 30d supply, fill #0

## 2023-11-29 MED ORDER — PROMETHAZINE-DM 6.25-15 MG/5ML PO SYRP
5.0000 mL | ORAL_SOLUTION | ORAL | 0 refills | Status: AC
  Filled 2023-11-29: qty 150, 5d supply, fill #0

## 2023-11-29 MED ORDER — NEBULIZER MISC
0 refills | Status: AC
  Filled 2023-11-29: qty 1, 30d supply, fill #0

## 2023-11-29 MED ORDER — CARVEDILOL 6.25 MG PO TABS
6.2500 mg | ORAL_TABLET | Freq: Two times a day (BID) | ORAL | 1 refills | Status: AC
  Filled 2023-11-29: qty 60, 30d supply, fill #0

## 2023-11-29 MED ORDER — QUETIAPINE FUMARATE 50 MG PO TABS
50.0000 mg | ORAL_TABLET | Freq: Every day | ORAL | 0 refills | Status: AC
  Filled 2023-11-29: qty 30, 30d supply, fill #0

## 2023-11-29 MED ORDER — OXYCODONE-ACETAMINOPHEN 10-325 MG PO TABS
1.0000 | ORAL_TABLET | Freq: Two times a day (BID) | ORAL | 0 refills | Status: AC
  Filled 2023-11-29: qty 28, 14d supply, fill #0

## 2023-11-29 MED ORDER — HYDROCHLOROTHIAZIDE 25 MG PO TABS
25.0000 mg | ORAL_TABLET | Freq: Every day | ORAL | 1 refills | Status: AC
  Filled 2023-11-29: qty 30, 30d supply, fill #0

## 2023-11-29 MED ORDER — ALBUTEROL SULFATE (2.5 MG/3ML) 0.083% IN NEBU
3.0000 mL | INHALATION_SOLUTION | Freq: Three times a day (TID) | RESPIRATORY_TRACT | 1 refills | Status: AC | PRN
  Filled 2023-11-29: qty 270, 30d supply, fill #0

## 2023-11-29 MED ORDER — FLUCONAZOLE 150 MG PO TABS
150.0000 mg | ORAL_TABLET | Freq: Every day | ORAL | 0 refills | Status: AC
  Filled 2023-11-29: qty 1, 1d supply, fill #0

## 2023-11-29 MED ORDER — ALBUTEROL SULFATE HFA 108 (90 BASE) MCG/ACT IN AERS
2.0000 | INHALATION_SPRAY | RESPIRATORY_TRACT | 1 refills | Status: AC | PRN
  Filled 2023-11-29: qty 6.7, 30d supply, fill #0

## 2023-11-29 MED ORDER — NEOMYCIN-POLYMYXIN-HC 3.5-10000-1 OT SUSP
4.0000 [drp] | Freq: Three times a day (TID) | OTIC | 0 refills | Status: AC
  Filled 2023-11-29: qty 10, 17d supply, fill #0

## 2023-11-29 MED ORDER — NYSTATIN 100000 UNIT/GM EX POWD
Freq: Two times a day (BID) | CUTANEOUS | 3 refills | Status: AC
  Filled 2023-11-29: qty 60, 30d supply, fill #0

## 2023-11-30 ENCOUNTER — Other Ambulatory Visit (HOSPITAL_COMMUNITY): Payer: Self-pay
